# Patient Record
Sex: Female | Born: 1945
Health system: Southern US, Community
[De-identification: ages and names within clinical notes are randomized; demographics above are authoritative.]

## PROBLEM LIST (undated history)

## (undated) DIAGNOSIS — T8859XA Other complications of anesthesia, initial encounter: Secondary | ICD-10-CM

## (undated) DIAGNOSIS — M1990 Unspecified osteoarthritis, unspecified site: Secondary | ICD-10-CM

## (undated) DIAGNOSIS — N200 Calculus of kidney: Secondary | ICD-10-CM

## (undated) DIAGNOSIS — T4145XA Adverse effect of unspecified anesthetic, initial encounter: Secondary | ICD-10-CM

## (undated) DIAGNOSIS — N3090 Cystitis, unspecified without hematuria: Secondary | ICD-10-CM

## (undated) DIAGNOSIS — K219 Gastro-esophageal reflux disease without esophagitis: Secondary | ICD-10-CM

## (undated) DIAGNOSIS — G5600 Carpal tunnel syndrome, unspecified upper limb: Secondary | ICD-10-CM

## (undated) DIAGNOSIS — I10 Essential (primary) hypertension: Secondary | ICD-10-CM

## (undated) DIAGNOSIS — G4730 Sleep apnea, unspecified: Secondary | ICD-10-CM

## (undated) HISTORY — PX: TONSILLECTOMY: SUR1361

## (undated) HISTORY — PX: APPENDECTOMY: SHX54

## (undated) HISTORY — PX: ADENOIDECTOMY: SUR15

## (undated) HISTORY — PX: ABDOMINAL HYSTERECTOMY: SHX81

## (undated) HISTORY — PX: HERNIA REPAIR: SHX51

---
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
## 1978-06-24 HISTORY — PX: CARPAL TUNNEL RELEASE: SHX101

## 1983-06-25 HISTORY — PX: ABDOMINAL HYSTERECTOMY: SHX81

## 1988-06-24 HISTORY — PX: HERNIA REPAIR: SHX51

## 1998-04-03 ENCOUNTER — Other Ambulatory Visit: Admission: RE | Admit: 1998-04-03 | Discharge: 1998-04-03 | Payer: Self-pay | Admitting: *Deleted

## 1998-04-20 ENCOUNTER — Encounter: Payer: Self-pay | Admitting: Urology

## 1998-04-20 ENCOUNTER — Ambulatory Visit (HOSPITAL_COMMUNITY): Admission: RE | Admit: 1998-04-20 | Discharge: 1998-04-20 | Payer: Self-pay | Admitting: Urology

## 1998-04-27 ENCOUNTER — Encounter: Payer: Self-pay | Admitting: Urology

## 1998-04-28 ENCOUNTER — Ambulatory Visit (HOSPITAL_COMMUNITY): Admission: RE | Admit: 1998-04-28 | Discharge: 1998-04-28 | Payer: Self-pay | Admitting: Urology

## 1999-02-22 ENCOUNTER — Other Ambulatory Visit: Admission: RE | Admit: 1999-02-22 | Discharge: 1999-02-22 | Payer: Self-pay | Admitting: *Deleted

## 1999-04-17 ENCOUNTER — Other Ambulatory Visit: Admission: RE | Admit: 1999-04-17 | Discharge: 1999-04-17 | Payer: Self-pay | Admitting: Urology

## 1999-05-01 ENCOUNTER — Other Ambulatory Visit: Admission: RE | Admit: 1999-05-01 | Discharge: 1999-05-01 | Payer: Self-pay | Admitting: Urology

## 1999-05-08 ENCOUNTER — Encounter: Payer: Self-pay | Admitting: Urology

## 1999-05-08 ENCOUNTER — Ambulatory Visit (HOSPITAL_COMMUNITY): Admission: RE | Admit: 1999-05-08 | Discharge: 1999-05-08 | Payer: Self-pay | Admitting: Urology

## 1999-12-25 ENCOUNTER — Encounter: Payer: Self-pay | Admitting: Urology

## 1999-12-25 ENCOUNTER — Encounter: Admission: RE | Admit: 1999-12-25 | Discharge: 1999-12-25 | Payer: Self-pay | Admitting: Urology

## 2001-01-19 ENCOUNTER — Encounter: Payer: Self-pay | Admitting: Rheumatology

## 2001-01-19 ENCOUNTER — Encounter: Admission: RE | Admit: 2001-01-19 | Discharge: 2001-01-19 | Payer: Self-pay | Admitting: Rheumatology

## 2001-05-13 ENCOUNTER — Ambulatory Visit (HOSPITAL_COMMUNITY): Admission: RE | Admit: 2001-05-13 | Discharge: 2001-05-13 | Payer: Self-pay | Admitting: Gastroenterology

## 2002-05-23 ENCOUNTER — Inpatient Hospital Stay (HOSPITAL_COMMUNITY): Admission: EM | Admit: 2002-05-23 | Discharge: 2002-05-24 | Payer: Self-pay

## 2002-05-24 ENCOUNTER — Encounter: Payer: Self-pay | Admitting: Cardiology

## 2004-03-30 ENCOUNTER — Encounter: Payer: Self-pay | Admitting: Pulmonary Disease

## 2004-04-12 ENCOUNTER — Encounter: Admission: RE | Admit: 2004-04-12 | Discharge: 2004-04-12 | Payer: Self-pay | Admitting: Orthopedic Surgery

## 2004-04-23 ENCOUNTER — Ambulatory Visit: Payer: Self-pay | Admitting: Pulmonary Disease

## 2004-04-23 ENCOUNTER — Ambulatory Visit (HOSPITAL_BASED_OUTPATIENT_CLINIC_OR_DEPARTMENT_OTHER): Admission: RE | Admit: 2004-04-23 | Discharge: 2004-04-23 | Payer: Self-pay | Admitting: Pulmonary Disease

## 2004-05-04 ENCOUNTER — Ambulatory Visit (HOSPITAL_BASED_OUTPATIENT_CLINIC_OR_DEPARTMENT_OTHER): Admission: RE | Admit: 2004-05-04 | Discharge: 2004-05-04 | Payer: Self-pay | Admitting: Orthopedic Surgery

## 2004-05-30 ENCOUNTER — Encounter: Admission: RE | Admit: 2004-05-30 | Discharge: 2004-05-30 | Payer: Self-pay | Admitting: Gastroenterology

## 2004-07-03 ENCOUNTER — Ambulatory Visit: Payer: Self-pay | Admitting: Pulmonary Disease

## 2004-07-17 ENCOUNTER — Encounter: Admission: RE | Admit: 2004-07-17 | Discharge: 2004-10-15 | Payer: Self-pay | Admitting: Pulmonary Disease

## 2004-07-26 ENCOUNTER — Ambulatory Visit (HOSPITAL_COMMUNITY): Admission: RE | Admit: 2004-07-26 | Discharge: 2004-07-26 | Payer: Self-pay | Admitting: Gastroenterology

## 2004-07-31 ENCOUNTER — Ambulatory Visit: Payer: Self-pay | Admitting: Pulmonary Disease

## 2005-01-31 ENCOUNTER — Ambulatory Visit (HOSPITAL_COMMUNITY): Admission: RE | Admit: 2005-01-31 | Discharge: 2005-01-31 | Payer: Self-pay | Admitting: Gastroenterology

## 2005-08-02 ENCOUNTER — Encounter: Admission: RE | Admit: 2005-08-02 | Discharge: 2005-08-02 | Payer: Self-pay | Admitting: Gastroenterology

## 2006-04-01 ENCOUNTER — Encounter: Admission: RE | Admit: 2006-04-01 | Discharge: 2006-04-01 | Payer: Self-pay | Admitting: Internal Medicine

## 2007-02-26 ENCOUNTER — Ambulatory Visit: Payer: Self-pay | Admitting: Pulmonary Disease

## 2007-07-27 ENCOUNTER — Encounter: Admission: RE | Admit: 2007-07-27 | Discharge: 2007-07-27 | Payer: Self-pay | Admitting: Internal Medicine

## 2007-12-16 ENCOUNTER — Encounter: Admission: RE | Admit: 2007-12-16 | Discharge: 2007-12-16 | Payer: Self-pay | Admitting: Internal Medicine

## 2008-02-05 ENCOUNTER — Ambulatory Visit (HOSPITAL_COMMUNITY): Admission: RE | Admit: 2008-02-05 | Discharge: 2008-02-05 | Payer: Self-pay | Admitting: Internal Medicine

## 2008-03-21 DIAGNOSIS — Z9889 Other specified postprocedural states: Secondary | ICD-10-CM | POA: Insufficient documentation

## 2008-03-21 DIAGNOSIS — G4733 Obstructive sleep apnea (adult) (pediatric): Secondary | ICD-10-CM | POA: Insufficient documentation

## 2008-04-11 ENCOUNTER — Ambulatory Visit: Payer: Self-pay | Admitting: Pulmonary Disease

## 2008-04-11 DIAGNOSIS — R05 Cough: Secondary | ICD-10-CM | POA: Insufficient documentation

## 2008-04-11 DIAGNOSIS — E785 Hyperlipidemia, unspecified: Secondary | ICD-10-CM | POA: Insufficient documentation

## 2008-04-11 DIAGNOSIS — R059 Cough, unspecified: Secondary | ICD-10-CM | POA: Insufficient documentation

## 2008-04-25 ENCOUNTER — Ambulatory Visit: Payer: Self-pay | Admitting: Pulmonary Disease

## 2008-11-04 ENCOUNTER — Ambulatory Visit: Payer: Self-pay | Admitting: Pulmonary Disease

## 2008-11-04 DIAGNOSIS — J302 Other seasonal allergic rhinitis: Secondary | ICD-10-CM | POA: Insufficient documentation

## 2008-11-04 DIAGNOSIS — J309 Allergic rhinitis, unspecified: Secondary | ICD-10-CM | POA: Insufficient documentation

## 2008-11-27 ENCOUNTER — Ambulatory Visit: Payer: Self-pay | Admitting: Diagnostic Radiology

## 2008-11-27 ENCOUNTER — Emergency Department (HOSPITAL_BASED_OUTPATIENT_CLINIC_OR_DEPARTMENT_OTHER): Admission: EM | Admit: 2008-11-27 | Discharge: 2008-11-27 | Payer: Self-pay | Admitting: Emergency Medicine

## 2009-03-22 ENCOUNTER — Ambulatory Visit: Payer: Self-pay | Admitting: Pulmonary Disease

## 2009-03-22 DIAGNOSIS — J069 Acute upper respiratory infection, unspecified: Secondary | ICD-10-CM | POA: Insufficient documentation

## 2009-03-22 DIAGNOSIS — J22 Unspecified acute lower respiratory infection: Secondary | ICD-10-CM | POA: Insufficient documentation

## 2010-10-01 LAB — COMPREHENSIVE METABOLIC PANEL
ALT: 23 U/L (ref 0–35)
AST: 30 U/L (ref 0–37)
Albumin: 3.7 g/dL (ref 3.5–5.2)
Alkaline Phosphatase: 117 U/L (ref 39–117)
BUN: 8 mg/dL (ref 6–23)
CO2: 26 mEq/L (ref 19–32)
Calcium: 8.8 mg/dL (ref 8.4–10.5)
Chloride: 109 mEq/L (ref 96–112)
Creatinine, Ser: 0.7 mg/dL (ref 0.4–1.2)
GFR calc Af Amer: >60 mL/min (ref >60)
GFR calc non Af Amer: >60 mL/min (ref >60)
Glucose, Bld: 123 mg/dL — ABNORMAL HIGH (ref 70–99)
Potassium: 3.6 mEq/L (ref 3.5–5.1)
Sodium: 146 mEq/L — ABNORMAL HIGH (ref 135–145)
Total Bilirubin: 0.3 mg/dL (ref 0.3–1.2)
Total Protein: 6.8 g/dL (ref 6.0–8.3)

## 2010-10-01 LAB — URINE MICROSCOPIC-ADD ON

## 2010-10-01 LAB — DIFFERENTIAL
Basophils Absolute: 0.1 10*3/uL (ref 0.0–0.1)
Basophils Relative: 2 % — ABNORMAL HIGH (ref 0–1)
Eosinophils Absolute: 0.1 10*3/uL (ref 0.0–0.7)
Eosinophils Relative: 2 % (ref 0–5)
Lymphocytes Relative: 29 % (ref 12–46)
Lymphs Abs: 1.8 10*3/uL (ref 0.7–4.0)
Monocytes Absolute: 0.7 10*3/uL (ref 0.1–1.0)
Monocytes Relative: 12 % (ref 3–12)
Neutro Abs: 3.5 10*3/uL (ref 1.7–7.7)
Neutrophils Relative %: 55 % (ref 43–77)

## 2010-10-01 LAB — URINALYSIS, ROUTINE W REFLEX MICROSCOPIC
Bilirubin Urine: NEGATIVE
Glucose, UA: NEGATIVE mg/dL
Ketones, ur: NEGATIVE mg/dL
Nitrite: NEGATIVE
Protein, ur: NEGATIVE mg/dL
Specific Gravity, Urine: 1.021 (ref 1.005–1.030)
Urobilinogen, UA: 0.2 mg/dL (ref 0.0–1.0)
pH: 5.5 (ref 5.0–8.0)

## 2010-10-01 LAB — CBC
HCT: 37.7 % (ref 36.0–46.0)
Hemoglobin: 12.5 g/dL (ref 12.0–15.0)
MCHC: 33.1 g/dL (ref 30.0–36.0)
MCV: 87.7 fL (ref 78.0–100.0)
Platelets: 234 10*3/uL (ref 150–400)
RBC: 4.3 MIL/uL (ref 3.87–5.11)
RDW: 13.4 % (ref 11.5–15.5)
WBC: 6.2 10*3/uL (ref 4.0–10.5)

## 2010-11-09 NOTE — Discharge Summary (Signed)
   NAME:  [name], [name]                        ACCOUNT NO.:  [id number]   MEDICAL RECORD NO.:  [id number]                   PATIENT TYPE:  INP   LOCATION:  0371                                 FACILITY:  [facility]   PHYSICIAN:  Ike Bene, M.D.              DATE OF BIRTH:  May 12, 1946   DATE OF ADMISSION:  05/23/2002  DATE OF DISCHARGE:  05/24/2002                                 DISCHARGE SUMMARY   HOSPITAL COURSE:  [name] was admitted with a four hour history of  substernal chest pain. Cardiac isoenzymes were normal during admission to  [facility]. She has a significant family history of  coronary artery disease. She has a chronic problem with left arm and  shoulder pain which unfortunately, confounds the issue. This is related to  reflex sympathetic dystrophy. She had an nuclear medicine stress test  performed by [name], which was normal without any evidence of  ischemia. The decision was made that the patient could be safely discharged  to home with further evaluation as an outpatient for probable  gastroesophageal reflux disease substernal chest pain.   DISCHARGE DIAGNOSES:  1. Non-cardiac chest pain.  2. Normal nuclear medicine exercise stress test.  3. Chronic left arm and shoulder pain related to reflux sympathetic     dystrophy.   CONDITION ON DISCHARGE:  The patient was discharged in good condition and  improved without any evidence of chest pain.   DISCHARGE MEDICATIONS:  She was to be discharged home on her same pre-  hospital medications which includes Voltaren 75 mg by mouth daily for pain  and __ 40 mg by mouth each day.   FOLLOW UP:  She will follow-up with [name] one week after discharge.                                               Ike Bene, M.D.    RM/MEDQ  D:  06/11/2002  T:  06/12/2002  Job:  [id number]

## 2010-11-09 NOTE — Op Note (Signed)
[name], [name]              ACCOUNT NO.:  [id number]   MEDICAL RECORD NO.:  [id number]          PATIENT TYPE:  AMB   LOCATION:  ENDO                         FACILITY:  [facility]   PHYSICIAN:  James L. Malon Kindle., M.D.DATE OF BIRTH:  Nov 10, 1945   DATE OF PROCEDURE:  07/26/2004  DATE OF DISCHARGE:                                 OPERATIVE REPORT   PROCEDURE:  Esophagogastroduodenoscopy.   MEDICATIONS:  Fentanyl 50 mcg, Versed 7.5 mg IV.   SCOPE:  Olympus upper endoscope.   INDICATIONS FOR PROCEDURE:  Persistent _________ reflux symptoms with  dysphagia, barium swallow showing no clear stricture.   DESCRIPTION OF PROCEDURE:  Procedure had been explained to the patient and  consent obtained.  Patient placed in the left lateral decubitus position.  The Olympus scope was inserted and advanced.  The stomach was entered, the  pylorus identified and passed.  The duodenum including the duodenal bulb and  second portion were seen well and was unremarkable.  The scope was withdrawn  back into the stomach.  The pyloric channel, antrum and body were normal.  The fundus and cardia were seen on retroflexed view and looked normal.  There was a hiatal hernia __________ no stricture.  No evidence of Barrett's  esophagus.  The proximal esophagus was endoscopically normal.  The scope was  withdrawn and the patient tolerated the procedure well.   ASSESSMENT:  Gastroesophageal reflux disease, 530.81.   PLAN:  Will give Nexium in the morning, Prilosec OTC in the evening.  Give a  reflux sheet and see her back in the office in two months.      JLE/MEDQ  D:  07/26/2004  T:  07/26/2004  Job:  [id number]   cc:   Ike Bene, M.D.  [address]  [place]  [place] [address]  Fax: [phone]

## 2010-11-09 NOTE — H&P (Signed)
NAME:  ADYSSON, REVELLE                        ACCOUNT NO.:  [id number]   MEDICAL RECORD NO.:  [id number]                   PATIENT TYPE:  INP   LOCATION:  0371                                 [facility]ACILITY:  [facility]   PHYSICIAN:  Candyce Churn, M.D.          DATE OF BIRTH:  May 21, 1946   DATE OF ADMISSION:  05/23/2002  DATE OF DISCHARGE:                                HISTORY & PHYSICAL   CHIEF COMPLAINT:  Chest pain.   HISTORY OF PRESENT ILLNESS:  The patient is a 65 year old female with a  history of obesity, GERD, hiatal hernia, and a family history of coronary  artery disease, who presents with four hours of substernal chest  pressure/pain starting approximately 3:30 p.m.  The pain radiated down her  left arm as well.  The patient  complained of some palpitations and a feeling of severe dyspnea.  The pain  occurred at rest.  There was no substernal burning and no acid taste in her  mouth.  The patient wondered if the chest pain was from her reflux, and she  took a Nexium, which did not help it.  It also hurt to take a deep breath.  She is admitted to rule out coronary artery disease because she was given  three sublingual nitroglycerin in the emergency room, and her chest pain  resolved.   MEDICATIONS:  1. Voltaren 75 mg occasionally for joint pain.  She has not taken any in a     week.  2. Nexium 40 mg daily as needed.   ALLERGIES:  SULFA.   PAST MEDICAL HISTORY:  Mild asthma.  Otherwise, as per HPI.   PAST SURGICAL HISTORY:  1. Total abdominal hysterectomy without oophorectomy.  2. Inguinal hernia repair.  3. Bilateral carpal tunnel surgery.  4. Appendectomy.   FAMILY HISTORY:  Positive for diabetes, stroke, hypertension, and her father  had a small MI in his 63s.   SOCIAL HISTORY:  The patient is married.  No tobacco use or alcohol use.   REVIEW OF SYSTEMS:  The pain seems to not be exertional in nature.  It is  not pleuritic currently.  Otherwise  noncontributory.   PHYSICAL EXAMINATION:  GENERAL:  Obese female in no acute distress.  Pain-  free after three sublingual nitroglycerin in the emergency room.  VITAL SIGNS:  Blood pressure was 130/80, pulse 75 and regular, afebrile,  respiratory rate 18.  HEENT:  Atraumatic, normocephalic.  NECK:  Supple.  CHEST:  Clear to auscultation.  Painful to palpation at the left sternal  border.  ABDOMEN:  Soft and nontender.  EXTREMITIES:  Without cyanosis, clubbing, or edema.  NEUROLOGIC:  Nonfocal.   LABORATORY DATA:  White count 7200, hemoglobin 12.5, platelet count 291,000.  Sodium 140, potassium 3.4, chloride 110, bicarbonate 22, BUN 15, creatinine  0.4, blood sugar 117.  PT 13 seconds, PTT 34 seconds.  LFTs are normal.  CPK  124, CK-MB 1.2, troponin  less than 0.01.   EKG reveals sinus rhythm at 74 beats per minute.  Normal EKG.  Occasional  PACs.  T-waves inverted in lead III.  No ST segment elevation or depression.   Chest x-ray reveals no active disease.  Mild cardiomegaly.   ASSESSMENT:  The patient is a 65 year old female with chest pain for about  four hours, relieved with three to four sublingual nitroglycerin.  Need to  rule out ischemic heart disease.  Likely esophageal reflux.  Could also be  costochondritis.   PLAN:  1. CPKs x 3.  2. EKG in the a.m.  3. Nasal cannula O2 at 2 L.  4. Lovenox 1 mg/kg q.12h., first dose in the emergency room.  5. Aspirin therapy.  6. Protonix 40 mg daily.  7. Cardiac consult for treadmill stress test with Cardiolite in the a.m.  8. Repeat EKG if the patient has recurrent chest pain.                                                  Candyce Churn, M.D.    RNG/MEDQ  D:  05/24/2002  T:  05/24/2002  Job:  [id number]   cc:   Cassell Clement, M.D.  [address]  [place]  [place] [address]  Fax: [phone]   Ike Bene, M.D.  [address]  [place]  [place] [address]  Fax: [phone]

## 2010-11-09 NOTE — Procedures (Signed)
[name], [name] NO.:  [id number]   MEDICAL RECORD NO.:  [id number]          PATIENT TYPE:  OUT   LOCATION:  SLEEP CENTER                 FACILITY:  [facility]   PHYSICIAN:  Marcelyn Bruins, M.D. [facility] DATE OF BIRTH:  04/10/1946   DATE OF STUDY:  04/23/2004                              NOCTURNAL POLYSOMNOGRAM   REFERRING PHYSICIAN:  Dr. Marcelyn Bruins.   INDICATION FOR THE STUDY:  Hypersomnia with sleep apnea.  Epworth score is  13.   SLEEP ARCHITECTURE:  The patient had a total sleep time of 313 minutes with  a sleep efficiency of 70%.  She never achieved slow wave sleep and had  decreased REM.  Sleep onset latency was prolonged at 67 minutes and REM  onset was normal.   IMPRESSION:  1.  Very mild obstructive sleep apnea/hypopnea syndrome with a respiratory      disturbance index of seven events per hour and oxygen desaturation as      low as 78%.  Events were not positional.  There was significantly      decreased slow wave sleep and REM.  2.  Moderate snoring was noted throughout the study.  3.  No clinically significant cardiac arrhythmia.  4.  Moderate numbers of periodic leg jerks with six per hour resulting in      arousal or awakening.  Clinical correlation is suggested.      KC/MEDQ  D:  05/16/2004 12:18:38  T:  05/16/2004 14:31:39  Job:  [id number]

## 2010-11-09 NOTE — Consult Note (Signed)
NAME:  [name], [name]                        ACCOUNT NO.:  [id number]   MEDICAL RECORD NO.:  [id number]                   PATIENT TYPE:  INP   LOCATION:  0371                                 FACILITY[facility]   PHYSICIAN:  Cassell Clement, M.D.              DATE OF BIRTH:  [birth year]   DATE OF CONSULTATION:  05/24/2002  DATE OF DISCHARGE:                                   CONSULTATION   CHIEF COMPLAINT:  Chest pain.   HISTORY OF PRESENT ILLNESS:  This is a 65 year old, African-American female  admitted yesterday with anterior chest pain.  The pain was associated with  mild diaphoresis and no nausea or vomiting.  The pain did not radiate.  She  has no prior history of known coronary artery disease.  She does have a past  history of gastroesophageal reflux disease and is on Nexium.  Other  medication include Voltaren for arthritis.  She came to the emergency room  where she was evaluated by Dr. Kevan Ny and was admitted.  Electrocardiogram  was normal and her CK-MBs and troponin I's have been negative x2.  Telemetry  has been stable.   FAMILY HISTORY:  Positive for vascular disease in the father who has had a  stroke.   SOCIAL HISTORY:  She is married.  She has children.  She is a housewife.  She does not use alcohol or tobacco.   PAST SURGICAL HISTORY:  1. Hysterectomy.  2. Tonsillectomy.  3. Herniorrhaphy.  4. Carpal tunnel surgery x3.   REVIEW OF SYMPTOMS:  She is otherwise noncontributory.  She did have a  recent mammogram which was negative and has an appointment to see a surgeon  about left breast pain later this month.   PHYSICAL EXAMINATION:  VITAL SIGNS:  Weight 218.  Blood pressure 114/51,  pulse 60.  Oxygen saturations 96%.  GENERAL:  Reveals an obese woman in no acute distress.  HEENT:  Unremarkable.  NECK:  Jugular venous distention.  Carotids normal.  CHEST:  Clear to auscultation and percussion.  HEART:  Regular rate and rhythm without murmur, rub or  gallop.  ABDOMEN:  Soft and nontender.  EXTREMITIES:  Good peripheral pulses with no edema or phlebitis.   IMPRESSION:  Probable noncardiac chest pain, but with risk factors.    RECOMMENDATIONS:  Will proceed with a stress Cardiolite at [facility] later  today if it can be arranged logistically.                                               Cassell Clement, M.D.    TB/MEDQ  D:  05/24/2002  T:  05/24/2002  Job:  [id number]   cc:   Ike Bene, M.D.  [address]  [place]  [place] [address]  Fax: [phone]  Candyce Churn, M.D.  [address] [place]  [place] [address]  Fax: [phone]

## 2010-11-09 NOTE — Op Note (Signed)
[name], [name]              ACCOUNT NO.:  [id number]   MEDICAL RECORD NO.:  [id number]          PATIENT TYPE:  AMB   LOCATION:  NESC                         FACILITY:  [facility]   PHYSICIAN:  Madlyn Frankel. Charlann Boxer, M.D.  DATE OF BIRTH:  March 31, 1946   DATE OF PROCEDURE:  05/04/2004  DATE OF DISCHARGE:                                 OPERATIVE REPORT   PREOPERATIVE DIAGNOSES:  Right knee medial meniscal tear associated in a  setting of moderate degree of degenerative joint changes primarily in the  medial compartment patellofemoral.   POSTOPERATIVE DIAGNOSES/FINDINGS:  1.  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  2.  Grade 2-4 chondromalacia within the medial compartment including grade 4      exposed bone on the tibial surface and unstable flaps of cartilage along      the medial femoral condyle.  3.  Degenerative tearing to the posterior horn of the mid body of the medial      meniscus.  4.  Intact anterior cruciate ligament.  5.  Intact lateral compartment.   PROCEDURE:  Right knee diagnostic and operative arthroscopy with limited  anterior medial synovectomy, chondroplasty over the medial femoral condyle,  tibial surface as well as the patellofemoral compartment. A partial medial  meniscectomy was carried out.   SURGEON:  Madlyn Frankel. Charlann Boxer, M.D.   ASSISTANT:  None.   ANESTHESIA:  LMA general anesthesia.   COMPLICATIONS:  None apparent.   INDICATIONS FOR PROCEDURE:  [name] is a pleasant 65 year old female who  had been followed in the office for right knee pain. She had complained of  mechanical symptoms initially with medial joint line tenderness. We had  obtained x-rays that had indicated degenerative changes present, however,  based on persistent mechanical problems, inadequate response to conservative  measure including antiinflammatories and injections. The MRI was ordered  that revealed that she  had moderate degenerative changes with complex  tearing to the mid body of posterior horn of the medial meniscus.  There is  also noted to be some mild patellofemoral changes.   After reviewing the risks and benefits of right knee arthroscopy in the  setting including the risk that her knee would continue to be a bother based  on her arthritic symptoms, she consented for the procedure.  My feelings on  this procedure are that they could potentially debride mechanical symptoms  including the meniscus tears that could result in an ability to tolerate the  degenerative changes easier. After reviewing these risks and benefits, she  consented for the above procedure.   DESCRIPTION OF PROCEDURE:  The patient was brought to the operative theatre.  Once adequate anesthesia and preoperative antibiotics were administered, the  patient was positioned supine with the right leg in a leg holder. The right  lower extremity was then prepped and draped in a sterile fashion. Standard  inferolateral, superolateral and inferomedial portals were utilized.  Diagnostic evaluation of the knee was carried out.  The above noted findings  were noted.  Specifically there was degenerative changes  in the  patellofemoral medial compartment. These were immediately evident. She did  have evidence of some contracture to the medial compartment.  Moderate  effort on the MCL was required in order to open it up.  It would not  surprise me if the patient had some MCL tenderness postoperatively.  Upon  entry into the medial compartment, the changes of the medial femoral condyle  and tibial plateau were identified as was this complex tear into the mid  body and posterior horn of the medial meniscus. A 3.5 shaver was utilized to  initially debride hypertrophic and abundant synovium anteriorly to help with  the exposure but then also utilize and debride the medial meniscus. Loose  fragments of cartilage off the medial femoral  condyle were identified with a  probe and then debrided using the 3.5 shaver. Examination of the ACL  revealed that it was intact and lateral compartment to be intact.   Examination of the patellofemoral compartment revealed that it had some  grade 2 changes and the 3.5 shaver was utilized to debride this area.  Limited anterior and medial synovectomy was carried out to allow for  exposure and visualization in order to perform the case.   Following this, the knee was irrigated and suction drained. The portals were  reapproximated using 3-0 nylon and the knee dressed with a sterile bulky  Jones dressing. The patient was extubated and transferred to the recovery  room in stable condition.  Followup with her in 10-14 days. She will be  weightbearing as tolerated. Medications as prescribed.      MDO/MEDQ  D:  05/04/2004  T:  05/05/2004  Job:  [id number]

## 2010-11-09 NOTE — Op Note (Signed)
NAMEJORENE, [name]              ACCOUNT NO.:  [id number]   MEDICAL RECORD NO.:  [id number]          PATIENT TYPE:  AMB   LOCATION:  ENDO                         FACILITY:  [facility]   PHYSICIAN:  James L. Malon Kindle., M.D.DATE OF BIRTH:  02/25/1946   DATE OF PROCEDURE:  01/31/2005  DATE OF DISCHARGE:                                 OPERATIVE REPORT   PROCEDURE:  Colonoscopy.   MEDICATIONS:  Fentanyl 60 mcg, Versed 6 mg IV.   INDICATIONS FOR PROCEDURE:  Heme positive stools.   SCOPE:  Pediatric adjustable scope.   DESCRIPTION OF PROCEDURE:  The procedure explained to the patient and  consent obtained. With the patient in the left lateral decubitus position, a  digital exam performed, pediatric adjustable scope inserted and advanced.  The cecum easily reached, ileocecal valve and appendiceal orifice seen. The  scope withdrawn, mucosa carefully examined on withdrawal, no polyp seen  throughout, no diverticulosis. Normal mucosal pattern throughout. Rectum  free of polyps. In a retroflexed view, the patient was seen to have large  internal hemorrhoids. Scope withdrawn, patient tolerated the procedure well.   ASSESSMENT:  Heme positive stools probably due to internal hemorrhoids,  578.1. Will keep in fiber supplements, give a hemorrhoid instruction sheet,  recommend yearly Hemoccult's possibly repeating colonoscopy in 10 years.       JLE/MEDQ  D:  01/31/2005  T:  01/31/2005  Job:  [id number]   cc:   Ladell Pier, M.D.  Fax: [phone]   Leona Singleton, M.D.  [address]  [place]  [place] [address]  Fax: [phone]

## 2010-11-09 NOTE — Op Note (Signed)
[facility]  Patient:    [name], [name] Visit Number: [id number] MRN: [id number]          Service Type: END Location: ENDO Attending Physician:  Orland Mustard Dictated by:   Llana Aliment. Randa Evens, M.D. Proc. Date: 05/13/01 Admit Date:  05/13/2001   CC:         Barbette Hair. Vaughan Basta., M.D.   Operative Report  PROCEDURE:  Esophagogastroduodenoscopy.  MEDICATIONS:  Cetacaine spray, Fentanyl 50 mc, Versed 6 mg IV.  ENDOSCOPIST:  Llana Aliment. Randa Evens, M.D.  INDICATIONS FOR PROCEDURE:  The patient with a history of reflux with some increasing symptoms and signs of dysphagia.  Previous endoscopies have not shown the stricture.  She had been on Reglan, but got off the Reglan and we started her back.  DESCRIPTION OF PROCEDURE:  The procedure was explained to the patient and consent was obtained.  With the patient in the left lateral decubitus position the Olympus video endoscope was inserted into the esophagus and advanced under direct visualization.  The stomach was entered, the pylorus was identified and passed.  The duodenum including the bulb and second portion was seen well. The bulb and second portion were completely free of inflammation or ulceration.  The scope was withdrawn back into the stomach.  The pyloric channel was normal.  The antrum of the body was normal.  Along the greater curve was still some retained food material despite an overnight fast.  The fundus and cardia were normal.  There was a large 3-4 cm hiatal hernia.  The GE junction was widely patent.  There was no sign at all of a stricture.  The proximal esophagus was seen and was normal.  The scope was withdrawn.  The patient tolerated the procedure well.  She did have some wretching and gagging during the procedure.  She was resting comfortably at the termination of the procedure.  She was maintained on low flow oxygen and pulse oximeter throughout the  procedure.  ASSESSMENT: 1. Widely patent GE junction consistent with gastroesophageal reflux disease. 2. Probable gastroparesis.  I think this is clearly a factor in her reflux.  PLAN:  Will continue on Nexium and Reglan.  Will see her back in the office in three months. Dictated by:   Llana Aliment. Randa Evens, M.D. Attending Physician:  Orland Mustard DD:  05/13/01 TD:  05/13/01 Job: [id number]

## 2011-01-02 ENCOUNTER — Ambulatory Visit
Admission: RE | Admit: 2011-01-02 | Discharge: 2011-01-02 | Disposition: A | Discharge status: Home / Self Care | Payer: Medicare Other | Source: Ambulatory Visit | Attending: Internal Medicine | Admitting: Internal Medicine

## 2011-01-02 ENCOUNTER — Other Ambulatory Visit: Payer: Self-pay | Admitting: Internal Medicine

## 2011-01-02 DIAGNOSIS — R059 Cough, unspecified: Secondary | ICD-10-CM

## 2011-01-02 DIAGNOSIS — R05 Cough: Secondary | ICD-10-CM

## 2011-07-15 DIAGNOSIS — J3081 Allergic rhinitis due to animal (cat) (dog) hair and dander: Secondary | ICD-10-CM | POA: Diagnosis not present

## 2011-07-15 DIAGNOSIS — J301 Allergic rhinitis due to pollen: Secondary | ICD-10-CM | POA: Diagnosis not present

## 2011-07-15 DIAGNOSIS — J3089 Other allergic rhinitis: Secondary | ICD-10-CM | POA: Diagnosis not present

## 2011-07-18 DIAGNOSIS — N390 Urinary tract infection, site not specified: Secondary | ICD-10-CM | POA: Diagnosis not present

## 2011-08-07 DIAGNOSIS — N390 Urinary tract infection, site not specified: Secondary | ICD-10-CM | POA: Diagnosis not present

## 2011-08-07 DIAGNOSIS — N343 Urethral syndrome, unspecified: Secondary | ICD-10-CM | POA: Diagnosis not present

## 2011-08-14 DIAGNOSIS — J301 Allergic rhinitis due to pollen: Secondary | ICD-10-CM | POA: Diagnosis not present

## 2011-08-14 DIAGNOSIS — J3089 Other allergic rhinitis: Secondary | ICD-10-CM | POA: Diagnosis not present

## 2011-08-14 DIAGNOSIS — J3081 Allergic rhinitis due to animal (cat) (dog) hair and dander: Secondary | ICD-10-CM | POA: Diagnosis not present

## 2011-09-13 DIAGNOSIS — M629 Disorder of muscle, unspecified: Secondary | ICD-10-CM | POA: Diagnosis not present

## 2011-09-13 DIAGNOSIS — M778 Other enthesopathies, not elsewhere classified: Secondary | ICD-10-CM | POA: Diagnosis not present

## 2011-09-13 DIAGNOSIS — IMO0001 Reserved for inherently not codable concepts without codable children: Secondary | ICD-10-CM | POA: Diagnosis not present

## 2011-09-13 DIAGNOSIS — Z79899 Other long term (current) drug therapy: Secondary | ICD-10-CM | POA: Diagnosis not present

## 2011-09-13 DIAGNOSIS — E7800 Pure hypercholesterolemia, unspecified: Secondary | ICD-10-CM | POA: Diagnosis not present

## 2011-09-13 DIAGNOSIS — I10 Essential (primary) hypertension: Secondary | ICD-10-CM | POA: Diagnosis not present

## 2011-10-08 DIAGNOSIS — Z1231 Encounter for screening mammogram for malignant neoplasm of breast: Secondary | ICD-10-CM | POA: Diagnosis not present

## 2011-10-09 DIAGNOSIS — Z09 Encounter for follow-up examination after completed treatment for conditions other than malignant neoplasm: Secondary | ICD-10-CM | POA: Diagnosis not present

## 2011-10-09 DIAGNOSIS — H60399 Other infective otitis externa, unspecified ear: Secondary | ICD-10-CM | POA: Diagnosis not present

## 2011-10-09 DIAGNOSIS — R928 Other abnormal and inconclusive findings on diagnostic imaging of breast: Secondary | ICD-10-CM | POA: Diagnosis not present

## 2011-10-11 DIAGNOSIS — Z01419 Encounter for gynecological examination (general) (routine) without abnormal findings: Secondary | ICD-10-CM | POA: Diagnosis not present

## 2011-10-11 DIAGNOSIS — Z124 Encounter for screening for malignant neoplasm of cervix: Secondary | ICD-10-CM | POA: Diagnosis not present

## 2011-12-19 DIAGNOSIS — E119 Type 2 diabetes mellitus without complications: Secondary | ICD-10-CM | POA: Diagnosis not present

## 2011-12-19 DIAGNOSIS — I10 Essential (primary) hypertension: Secondary | ICD-10-CM | POA: Diagnosis not present

## 2011-12-19 DIAGNOSIS — N390 Urinary tract infection, site not specified: Secondary | ICD-10-CM | POA: Diagnosis not present

## 2012-01-08 DIAGNOSIS — R197 Diarrhea, unspecified: Secondary | ICD-10-CM | POA: Diagnosis not present

## 2012-01-28 DIAGNOSIS — R1031 Right lower quadrant pain: Secondary | ICD-10-CM | POA: Diagnosis not present

## 2012-01-28 DIAGNOSIS — N390 Urinary tract infection, site not specified: Secondary | ICD-10-CM | POA: Diagnosis not present

## 2012-02-04 DIAGNOSIS — R10814 Left lower quadrant abdominal tenderness: Secondary | ICD-10-CM | POA: Diagnosis not present

## 2012-02-04 DIAGNOSIS — E559 Vitamin D deficiency, unspecified: Secondary | ICD-10-CM | POA: Diagnosis not present

## 2012-02-04 DIAGNOSIS — N390 Urinary tract infection, site not specified: Secondary | ICD-10-CM | POA: Diagnosis not present

## 2012-02-04 DIAGNOSIS — Z79899 Other long term (current) drug therapy: Secondary | ICD-10-CM | POA: Diagnosis not present

## 2012-02-04 DIAGNOSIS — N1330 Unspecified hydronephrosis: Secondary | ICD-10-CM | POA: Diagnosis not present

## 2012-02-04 DIAGNOSIS — N134 Hydroureter: Secondary | ICD-10-CM | POA: Diagnosis not present

## 2012-02-04 DIAGNOSIS — IMO0001 Reserved for inherently not codable concepts without codable children: Secondary | ICD-10-CM | POA: Diagnosis not present

## 2012-02-04 DIAGNOSIS — Z0000 Encounter for general adult medical examination without abnormal findings: Secondary | ICD-10-CM | POA: Diagnosis not present

## 2012-02-04 DIAGNOSIS — R109 Unspecified abdominal pain: Secondary | ICD-10-CM | POA: Diagnosis not present

## 2012-02-04 DIAGNOSIS — R1032 Left lower quadrant pain: Secondary | ICD-10-CM | POA: Diagnosis not present

## 2012-02-04 DIAGNOSIS — I10 Essential (primary) hypertension: Secondary | ICD-10-CM | POA: Diagnosis not present

## 2012-02-04 DIAGNOSIS — E7800 Pure hypercholesterolemia, unspecified: Secondary | ICD-10-CM | POA: Diagnosis not present

## 2012-02-05 DIAGNOSIS — N390 Urinary tract infection, site not specified: Secondary | ICD-10-CM | POA: Diagnosis not present

## 2012-02-27 DIAGNOSIS — J019 Acute sinusitis, unspecified: Secondary | ICD-10-CM | POA: Diagnosis not present

## 2012-03-06 ENCOUNTER — Emergency Department (HOSPITAL_COMMUNITY)
Admission: EM | Admit: 2012-03-06 | Discharge: 2012-03-06 | Disposition: A | Discharge status: Home / Self Care | Payer: Medicare Other | Attending: Emergency Medicine | Admitting: Emergency Medicine

## 2012-03-06 ENCOUNTER — Emergency Department (HOSPITAL_COMMUNITY): Payer: Medicare Other

## 2012-03-06 ENCOUNTER — Encounter (HOSPITAL_COMMUNITY): Payer: Self-pay | Admitting: Emergency Medicine

## 2012-03-06 DIAGNOSIS — N390 Urinary tract infection, site not specified: Secondary | ICD-10-CM

## 2012-03-06 DIAGNOSIS — K5730 Diverticulosis of large intestine without perforation or abscess without bleeding: Secondary | ICD-10-CM | POA: Diagnosis not present

## 2012-03-06 DIAGNOSIS — Z79899 Other long term (current) drug therapy: Secondary | ICD-10-CM | POA: Diagnosis not present

## 2012-03-06 DIAGNOSIS — E119 Type 2 diabetes mellitus without complications: Secondary | ICD-10-CM | POA: Insufficient documentation

## 2012-03-06 DIAGNOSIS — R319 Hematuria, unspecified: Secondary | ICD-10-CM | POA: Diagnosis not present

## 2012-03-06 DIAGNOSIS — Z7982 Long term (current) use of aspirin: Secondary | ICD-10-CM | POA: Insufficient documentation

## 2012-03-06 DIAGNOSIS — N3289 Other specified disorders of bladder: Secondary | ICD-10-CM | POA: Diagnosis not present

## 2012-03-06 DIAGNOSIS — Z9089 Acquired absence of other organs: Secondary | ICD-10-CM | POA: Diagnosis not present

## 2012-03-06 HISTORY — DX: Carpal tunnel syndrome, unspecified upper limb: G56.00

## 2012-03-06 HISTORY — DX: Cystitis, unspecified without hematuria: N30.90

## 2012-03-06 HISTORY — DX: Calculus of kidney: N20.0

## 2012-03-06 LAB — URINALYSIS, ROUTINE W REFLEX MICROSCOPIC
Bilirubin Urine: NEGATIVE
Glucose, UA: NEGATIVE mg/dL
Ketones, ur: NEGATIVE mg/dL
Nitrite: NEGATIVE
Protein, ur: NEGATIVE mg/dL
Specific Gravity, Urine: 1.019 (ref 1.005–1.030)
Urobilinogen, UA: 0.2 mg/dL (ref 0.0–1.0)
pH: 5.5 (ref 5.0–8.0)

## 2012-03-06 LAB — CBC WITH DIFFERENTIAL/PLATELET
Basophils Absolute: 0 10*3/uL (ref 0.0–0.1)
Basophils Relative: 1 % (ref 0–1)
Eosinophils Absolute: 0.4 10*3/uL (ref 0.0–0.7)
Eosinophils Relative: 6 % — ABNORMAL HIGH (ref 0–5)
HCT: 35.3 % — ABNORMAL LOW (ref 36.0–46.0)
Hemoglobin: 11.5 g/dL — ABNORMAL LOW (ref 12.0–15.0)
Lymphocytes Relative: 35 % (ref 12–46)
Lymphs Abs: 2.3 10*3/uL (ref 0.7–4.0)
MCH: 28 pg (ref 26.0–34.0)
MCHC: 32.6 g/dL (ref 30.0–36.0)
MCV: 85.9 fL (ref 78.0–100.0)
Monocytes Absolute: 0.5 10*3/uL (ref 0.1–1.0)
Monocytes Relative: 8 % (ref 3–12)
Neutro Abs: 3.3 10*3/uL (ref 1.7–7.7)
Neutrophils Relative %: 51 % (ref 43–77)
Platelets: 288 10*3/uL (ref 150–400)
RBC: 4.11 MIL/uL (ref 3.87–5.11)
RDW: 14.2 % (ref 11.5–15.5)
WBC: 6.5 10*3/uL (ref 4.0–10.5)

## 2012-03-06 LAB — BASIC METABOLIC PANEL
BUN: 11 mg/dL (ref 6–23)
CO2: 24 mEq/L (ref 19–32)
Calcium: 9.3 mg/dL (ref 8.4–10.5)
Chloride: 102 mEq/L (ref 96–112)
Creatinine, Ser: 0.64 mg/dL (ref 0.50–1.10)
GFR calc Af Amer: >90 mL/min (ref >90)
GFR calc non Af Amer: >90 mL/min (ref >90)
Glucose, Bld: 126 mg/dL — ABNORMAL HIGH (ref 70–99)
Potassium: 3.7 mEq/L (ref 3.5–5.1)
Sodium: 136 mEq/L (ref 135–145)

## 2012-03-06 LAB — URINE MICROSCOPIC-ADD ON

## 2012-03-06 MED ORDER — HYDROCODONE-ACETAMINOPHEN 5-325 MG PO TABS: ORAL_TABLET | ORAL | Status: AC

## 2012-03-06 MED ORDER — PHENAZOPYRIDINE HCL 200 MG PO TABS: 200.0000 mg | ORAL_TABLET | Freq: Three times a day (TID) | ORAL | Status: AC | PRN

## 2012-03-06 MED ORDER — SODIUM CHLORIDE 0.9 % IV SOLN
Freq: Once | INTRAVENOUS | Status: AC
  Administered 2012-03-06: 09:00:00 via INTRAVENOUS

## 2012-03-06 MED ORDER — CEPHALEXIN 500 MG PO CAPS: 500.0000 mg | ORAL_CAPSULE | Freq: Three times a day (TID) | ORAL | Status: AC

## 2012-03-06 MED ORDER — HYDROMORPHONE HCL PF 1 MG/ML IJ SOLN
0.5000 mg | Freq: Once | INTRAMUSCULAR | Status: AC
  Administered 2012-03-06: 0.5 mg via INTRAVENOUS
  Filled 2012-03-06: qty 1

## 2012-03-06 MED ORDER — DEXTROSE 5 % IV SOLN
1.0000 g | Freq: Once | INTRAVENOUS | Status: AC
  Administered 2012-03-06: 1 g via INTRAVENOUS
  Filled 2012-03-06: qty 10

## 2012-03-06 MED ORDER — PHENAZOPYRIDINE HCL 200 MG PO TABS
200.0000 mg | ORAL_TABLET | Freq: Once | ORAL | Status: AC
  Administered 2012-03-06: 200 mg via ORAL
  Filled 2012-03-06: qty 1

## 2012-03-06 NOTE — ED Notes (Signed)
Pt to CT

## 2012-03-06 NOTE — ED Notes (Signed)
Pt alert and oriented x4. Respirations even and unlabored, bilateral symmetrical rise and fall of chest. Skin warm and dry. In no acute distress. Denies needs.   

## 2012-03-06 NOTE — ED Provider Notes (Signed)
History     CSN: [id number]  Arrival date & time 03/06/12  0749   First MD Initiated Contact with Patient 03/06/12 [id number]      Chief Complaint  Patient presents with  . Abdominal Pain    (Consider location/radiation/quality/duration/timing/severity/associated sxs/prior treatment) HPI Comments: Pt reports about 1.5 months ago began having episodes of a sharp, stinging pain in bladder and urethra area.  Now is more frequent, happening up to 2 times per day lasting 15 minutes up to no more than 1 hour or so.  Slight nausea, no vomiting.  Denies dysuria.  She had some blood in urine at the time, seen by Dr. Brunilda Payor, treated as infection.  She also saw Dr. Henderson Cloud with GYN who evaluated her, did an ultrasound and told her ovaries were normal about 1 month ago.  She has a recheck with Dr. Brunilda Payor soon, but this AM, has had pain all morning and not improving.  No fevers, chills.  No flank pain.  Pain is in middle of suprapubic area.  No vaginal bleed or discharge.  She has had appendectomy and partial hysterectomy in the past.  No constipation or diarrhea.  When she sits or act of standing up seems to trigger the pain.  She has not had a CT or MRI for these symptoms.  Pain gets so severe, she cannot walk until it subsides.    The history is provided by the patient, the spouse and medical records.    Past Medical History  Diagnosis Date  . Kidney stones   . Kidney stone   . Bladder infection   . Carpal tunnel syndrome   . Diabetes mellitus     Past Surgical History  Procedure Date  . Tonsillectomy   . Abdominal hysterectomy     partial  . Hernia repair   . Appendectomy     History reviewed. No pertinent family history.  History  Substance Use Topics  . Smoking status: Never Smoker   . Smokeless tobacco: Not on file  . Alcohol Use: No    OB History    Grav Para Term Preterm Abortions TAB SAB Ect Mult Living                  Review of Systems  Constitutional: Negative for fever  and chills.  Gastrointestinal: Positive for nausea and abdominal pain. Negative for vomiting, diarrhea, constipation, anal bleeding and rectal pain.  Genitourinary: Positive for hematuria. Negative for dysuria, decreased urine volume, vaginal bleeding and vaginal discharge.  Musculoskeletal: Negative for back pain.  All other systems reviewed and are negative.    Allergies  Sulfonamide derivatives and Tetanus toxoids  Home Medications   Current Outpatient Rx  Name Route Sig Dispense Refill  . ASPIRIN EC 81 MG PO TBEC Oral Take 81 mg by mouth daily.    . COQ10 100 MG PO CAPS Oral Take 1 capsule by mouth daily.    Marland Kitchen HYDROCHLOROTHIAZIDE 12.5 MG PO CAPS Oral Take 12.5 mg by mouth daily as needed. For excess fluid    . LOSARTAN POTASSIUM 25 MG PO TABS Oral Take 25 mg by mouth daily.    Marland Kitchen METFORMIN HCL 500 MG PO TABS Oral Take 500 mg by mouth 2 (two) times daily with a meal.    . OMEGA-3-ACID ETHYL ESTERS 1 G PO CAPS Oral Take 2 g by mouth 3 (three) times a week. Monday, Wednesday, Friday    . POTASSIUM CHLORIDE CRYS ER 20 MEQ PO TBCR Oral Take  20 mEq by mouth as needed. Take when you take your Hydrochlorothiazide    . CEPHALEXIN 500 MG PO CAPS Oral Take 1 capsule (500 mg total) by mouth 3 (three) times daily. 30 capsule 0  . HYDROCODONE-ACETAMINOPHEN 5-325 MG PO TABS  1-2 tablets po q 6 hours prn moderate to severe pain 15 tablet 0  . PHENAZOPYRIDINE HCL 200 MG PO TABS Oral Take 1 tablet (200 mg total) by mouth 3 (three) times daily as needed for pain. 6 tablet 1    BP 126/66  Pulse 55  Temp 98.1 F (36.7 C)  Resp 18  Ht 5\' 3"  (1.6 m)  Wt 241 lb (109.317 kg)  BMI 42.69 kg/m2  SpO2 99%  Physical Exam  Nursing note and vitals reviewed. Constitutional: She is oriented to person, place, and time. She appears well-developed and well-nourished.  HENT:  Head: Normocephalic and atraumatic.  Eyes: Pupils are equal, round, and reactive to light.  Neck: Normal range of motion. Neck supple.   Cardiovascular: Normal rate and regular rhythm.   No murmur heard. Pulmonary/Chest: Effort normal. No respiratory distress. She has no wheezes.  Abdominal: Soft. Bowel sounds are normal. She exhibits no distension. There is tenderness. There is no rebound and no guarding.  Neurological: She is alert and oriented to person, place, and time.  Skin: Skin is warm. No rash noted.  Psychiatric: She has a normal mood and affect.    ED Course  Procedures (including critical care time)  Labs Reviewed  CBC WITH DIFFERENTIAL - Abnormal; Notable for the following:    Hemoglobin 11.5 (*)     HCT 35.3 (*)     Eosinophils Relative 6 (*)     All other components within normal limits  BASIC METABOLIC PANEL - Abnormal; Notable for the following:    Glucose, Bld 126 (*)     All other components within normal limits  URINALYSIS, ROUTINE W REFLEX MICROSCOPIC - Abnormal; Notable for the following:    APPearance CLOUDY (*)     Hgb urine dipstick MODERATE (*)     Leukocytes, UA LARGE (*)     All other components within normal limits  URINE MICROSCOPIC-ADD ON - Abnormal; Notable for the following:    Bacteria, UA MANY (*)     All other components within normal limits  URINE CULTURE   Ct Abdomen Pelvis Wo Contrast  03/06/2012  *RADIOLOGY REPORT*  Clinical Data: Suprapubic pain, hematuria for 1 month  CT ABDOMEN AND PELVIS WITHOUT CONTRAST  Technique:  Multidetector CT imaging of the abdomen and pelvis was performed following the standard protocol without intravenous contrast.  Comparison: CT abdomen pelvis of 11/27/2008  Findings: The lung bases are clear.  On this unenhanced study the liver is unremarkable.  No calcified gallstones are noted.  The pancreas, in size and the pancreatic duct is not the unremarkable. The stomach is not well distended.  No renal calculi are seen.  No renal mass is evident on this unenhanced study.  The proximal ureters are normal in caliber.  Abdominal aorta appears normal.  No  adenopathy is seen.  The distal ureters appear normal in caliber and no distal ureteral calculi are noted.  The urinary bladder is moderately urine distended with no abnormality noted.  The uterus has been resected. No adnexal lesion is seen.  No fluid is noted within the pelvis. There are rectosigmoid colonic diverticula present.  Diverticula also are noted throughout the entire left colon.  The terminal ileum is unremarkable.  The appendix is not visualized.  There is degenerative disc disease at L3-4 and L5-S1 with slight anterolisthesis of L4 on L5 by 5 mm apparently due to degenerative change involving the facet joints.  IMPRESSION:  1.  No explanation for the patient's hematuria is seen.  No renal or ureteral calculi are noted. 2.  The urinary bladder is unremarkable in the unenhanced state as are the kidneys. 3.  Multiple rectosigmoid and descending colon diverticula. 4.  5 mm anterolisthesis of L4 on L5.  Degenerative disc disease at L3-4 and L5- S1.   Original Report Authenticated By: Juline Patch, M.D.      1. Urinary tract infection   2. Bladder spasm     RA sat is 99% and I interpret to be normal.  11:33 AM Discussed with Dr. Brunilda Payor. He is scheduled to see on 9/26, will plan on scope . This is relayed to pt.  He reports pt should call her if she worsens.  Agrees with pyridium for her.    MDM  Pt felt improved after IV dilaudid.  Pt's CT shows no stones, renal involvement.  Some L4 on 5 anterolithesis, so I informed pt that back problems may be a component, although I doubt.  Will start IV abx and discuss with Dr. Brunilda Payor.          Gavin Pound. Demont Linford, MD 03/06/12 1137

## 2012-03-06 NOTE — ED Notes (Signed)
Patient states she has had abdominal pain of 9/10 for the last couple months.  Patient states she has been seen by a gynecologist and her PCP for the same.  Patient states she has had an ultrasound, blood work, and a pelvic exam previously and they found a bladder infection and a small kidney stone.  Patient has been treated for these conditions.  Patient has sharp lower abdominal pain that has worsened since last night.  Patient has nausea but denies fevers, vomiting, and diarrhea.

## 2012-03-06 NOTE — Discharge Instructions (Signed)
 Urinary Tract Infection Infections of the urinary tract can start in several places. A bladder infection (cystitis), a kidney infection (pyelonephritis), and a prostate infection (prostatitis) are different types of urinary tract infections (UTIs). They usually get better if treated with medicines (antibiotics) that kill germs. Take all the medicine until it is gone. You or your child may feel better in a few days, but TAKE ALL MEDICINE or the infection may not respond and may become more difficult to treat. HOME CARE INSTRUCTIONS   Drink enough water and fluids to keep the urine clear or pale yellow. Cranberry juice is especially recommended, in addition to large amounts of water.   Avoid caffeine, tea, and carbonated beverages. They tend to irritate the bladder.   Alcohol may irritate the prostate.   Only take over-the-counter or prescription medicines for pain, discomfort, or fever as directed by your caregiver.  To prevent further infections:  Empty the bladder often. Avoid holding urine for long periods of time.   After a bowel movement, women should cleanse from front to back. Use each tissue only once.   Empty the bladder before and after sexual intercourse.  FINDING OUT THE RESULTS OF YOUR TEST Not all test results are available during your visit. If your or your child's test results are not back during the visit, make an appointment with your caregiver to find out the results. Do not assume everything is normal if you have not heard from your caregiver or the medical facility. It is important for you to follow up on all test results. SEEK MEDICAL CARE IF:   There is back pain.   Your baby is older than 3 months with a rectal temperature of 100.5 F (38.1 C) or higher for more than 1 day.   Your or your child's problems (symptoms) are no better in 3 days. Return sooner if you or your child is getting worse.  SEEK IMMEDIATE MEDICAL CARE IF:   There is severe back pain or lower  abdominal pain.   You or your child develops chills.   You have a fever.   Your baby is older than 3 months with a rectal temperature of 102 F (38.9 C) or higher.   Your baby is 32 months old or younger with a rectal temperature of 100.4 F (38 C) or higher.   There is nausea or vomiting.   There is continued burning or discomfort with urination.  MAKE SURE YOU:   Understand these instructions.   Will watch your condition.   Will get help right away if you are not doing well or get worse.  Document Released: 03/20/2005 Document Revised: 05/30/2011 Document Reviewed: 10/23/2006 [facility] Patient Information 2012 [place], [place].    Narcotic and benzodiazepine use may cause drowsiness, slowed breathing or dependence.  Please use with caution and do not drive, operate machinery or watch young children alone while taking them.  Taking combinations of these medications or drinking alcohol will potentiate these effects.

## 2012-03-07 LAB — URINE CULTURE: Colony Count: 3000

## 2012-03-11 DIAGNOSIS — E7800 Pure hypercholesterolemia, unspecified: Secondary | ICD-10-CM | POA: Diagnosis not present

## 2012-03-11 DIAGNOSIS — I10 Essential (primary) hypertension: Secondary | ICD-10-CM | POA: Diagnosis not present

## 2012-03-11 DIAGNOSIS — E119 Type 2 diabetes mellitus without complications: Secondary | ICD-10-CM | POA: Diagnosis not present

## 2012-03-11 DIAGNOSIS — R0602 Shortness of breath: Secondary | ICD-10-CM | POA: Diagnosis not present

## 2012-03-17 DIAGNOSIS — H04129 Dry eye syndrome of unspecified lacrimal gland: Secondary | ICD-10-CM | POA: Diagnosis not present

## 2012-03-17 DIAGNOSIS — E119 Type 2 diabetes mellitus without complications: Secondary | ICD-10-CM | POA: Diagnosis not present

## 2012-03-17 DIAGNOSIS — H35379 Puckering of macula, unspecified eye: Secondary | ICD-10-CM | POA: Diagnosis not present

## 2012-03-17 DIAGNOSIS — H2510 Age-related nuclear cataract, unspecified eye: Secondary | ICD-10-CM | POA: Diagnosis not present

## 2012-03-19 DIAGNOSIS — N390 Urinary tract infection, site not specified: Secondary | ICD-10-CM | POA: Diagnosis not present

## 2012-03-23 DIAGNOSIS — IMO0001 Reserved for inherently not codable concepts without codable children: Secondary | ICD-10-CM | POA: Diagnosis not present

## 2012-03-23 DIAGNOSIS — N390 Urinary tract infection, site not specified: Secondary | ICD-10-CM | POA: Diagnosis not present

## 2012-03-23 DIAGNOSIS — I10 Essential (primary) hypertension: Secondary | ICD-10-CM | POA: Diagnosis not present

## 2012-03-23 DIAGNOSIS — E039 Hypothyroidism, unspecified: Secondary | ICD-10-CM | POA: Diagnosis not present

## 2012-04-08 DIAGNOSIS — N6489 Other specified disorders of breast: Secondary | ICD-10-CM | POA: Diagnosis not present

## 2012-04-08 DIAGNOSIS — Z09 Encounter for follow-up examination after completed treatment for conditions other than malignant neoplasm: Secondary | ICD-10-CM | POA: Diagnosis not present

## 2012-04-09 DIAGNOSIS — E119 Type 2 diabetes mellitus without complications: Secondary | ICD-10-CM | POA: Diagnosis not present

## 2012-04-09 DIAGNOSIS — R0602 Shortness of breath: Secondary | ICD-10-CM | POA: Diagnosis not present

## 2012-04-09 DIAGNOSIS — I10 Essential (primary) hypertension: Secondary | ICD-10-CM | POA: Diagnosis not present

## 2012-04-17 DIAGNOSIS — I10 Essential (primary) hypertension: Secondary | ICD-10-CM | POA: Diagnosis not present

## 2012-04-17 DIAGNOSIS — R0602 Shortness of breath: Secondary | ICD-10-CM | POA: Diagnosis not present

## 2012-04-17 DIAGNOSIS — E119 Type 2 diabetes mellitus without complications: Secondary | ICD-10-CM | POA: Diagnosis not present

## 2012-04-23 DIAGNOSIS — R0602 Shortness of breath: Secondary | ICD-10-CM | POA: Diagnosis not present

## 2012-04-23 DIAGNOSIS — I10 Essential (primary) hypertension: Secondary | ICD-10-CM | POA: Diagnosis not present

## 2012-04-23 DIAGNOSIS — E119 Type 2 diabetes mellitus without complications: Secondary | ICD-10-CM | POA: Diagnosis not present

## 2012-04-23 DIAGNOSIS — E7800 Pure hypercholesterolemia, unspecified: Secondary | ICD-10-CM | POA: Diagnosis not present

## 2012-04-29 DIAGNOSIS — E039 Hypothyroidism, unspecified: Secondary | ICD-10-CM | POA: Diagnosis not present

## 2012-06-03 DIAGNOSIS — IMO0001 Reserved for inherently not codable concepts without codable children: Secondary | ICD-10-CM | POA: Diagnosis not present

## 2012-06-03 DIAGNOSIS — I10 Essential (primary) hypertension: Secondary | ICD-10-CM | POA: Diagnosis not present

## 2012-06-03 DIAGNOSIS — Z8249 Family history of ischemic heart disease and other diseases of the circulatory system: Secondary | ICD-10-CM | POA: Diagnosis not present

## 2012-06-03 DIAGNOSIS — Z79899 Other long term (current) drug therapy: Secondary | ICD-10-CM | POA: Diagnosis not present

## 2012-07-30 DIAGNOSIS — I10 Essential (primary) hypertension: Secondary | ICD-10-CM | POA: Diagnosis not present

## 2012-07-30 DIAGNOSIS — Z79899 Other long term (current) drug therapy: Secondary | ICD-10-CM | POA: Diagnosis not present

## 2012-07-30 DIAGNOSIS — N390 Urinary tract infection, site not specified: Secondary | ICD-10-CM | POA: Diagnosis not present

## 2012-07-30 DIAGNOSIS — IMO0001 Reserved for inherently not codable concepts without codable children: Secondary | ICD-10-CM | POA: Diagnosis not present

## 2012-07-30 DIAGNOSIS — N318 Other neuromuscular dysfunction of bladder: Secondary | ICD-10-CM | POA: Diagnosis not present

## 2012-09-17 DIAGNOSIS — R350 Frequency of micturition: Secondary | ICD-10-CM | POA: Diagnosis not present

## 2012-09-17 DIAGNOSIS — N949 Unspecified condition associated with female genital organs and menstrual cycle: Secondary | ICD-10-CM | POA: Diagnosis not present

## 2012-09-17 DIAGNOSIS — Z8719 Personal history of other diseases of the digestive system: Secondary | ICD-10-CM | POA: Diagnosis not present

## 2012-09-17 DIAGNOSIS — R3129 Other microscopic hematuria: Secondary | ICD-10-CM | POA: Diagnosis not present

## 2012-09-21 DIAGNOSIS — E7800 Pure hypercholesterolemia, unspecified: Secondary | ICD-10-CM | POA: Diagnosis not present

## 2012-09-21 DIAGNOSIS — E119 Type 2 diabetes mellitus without complications: Secondary | ICD-10-CM | POA: Diagnosis not present

## 2012-09-21 DIAGNOSIS — R0602 Shortness of breath: Secondary | ICD-10-CM | POA: Diagnosis not present

## 2012-09-21 DIAGNOSIS — I10 Essential (primary) hypertension: Secondary | ICD-10-CM | POA: Diagnosis not present

## 2012-09-30 DIAGNOSIS — IMO0001 Reserved for inherently not codable concepts without codable children: Secondary | ICD-10-CM | POA: Diagnosis not present

## 2012-09-30 DIAGNOSIS — G4733 Obstructive sleep apnea (adult) (pediatric): Secondary | ICD-10-CM | POA: Diagnosis not present

## 2012-09-30 DIAGNOSIS — G4730 Sleep apnea, unspecified: Secondary | ICD-10-CM | POA: Diagnosis not present

## 2012-09-30 DIAGNOSIS — I10 Essential (primary) hypertension: Secondary | ICD-10-CM | POA: Diagnosis not present

## 2012-09-30 DIAGNOSIS — Z79899 Other long term (current) drug therapy: Secondary | ICD-10-CM | POA: Diagnosis not present

## 2012-09-30 DIAGNOSIS — Z6841 Body Mass Index (BMI) 40.0 and over, adult: Secondary | ICD-10-CM | POA: Diagnosis not present

## 2012-10-01 IMAGING — CT CT ABD-PELV W/O CM
1 series · 14 of 17 positions shown, 19 images · non-contrast
Comparison: CT abdomen pelvis of 11/27/2008

CLINICAL DATA: Suprapubic pain, hematuria for 1 month

CT ABDOMEN AND PELVIS WITHOUT CONTRAST
TECHNIQUE: Multidetector CT imaging of the abdomen and pelvis was
performed following the standard protocol without intravenous
contrast.

[Series 4: lung · axial · 0.73mm/px · z∈[-126,-56]mm · 14 of 17 slices shown, 19 images]
[im 2/17  soft-tissue]
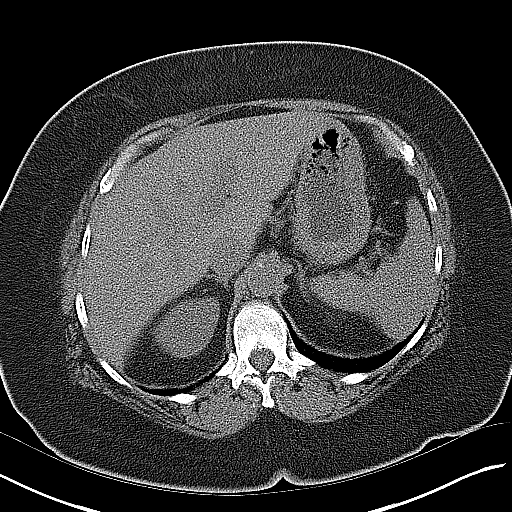
[im 2/17  bone]
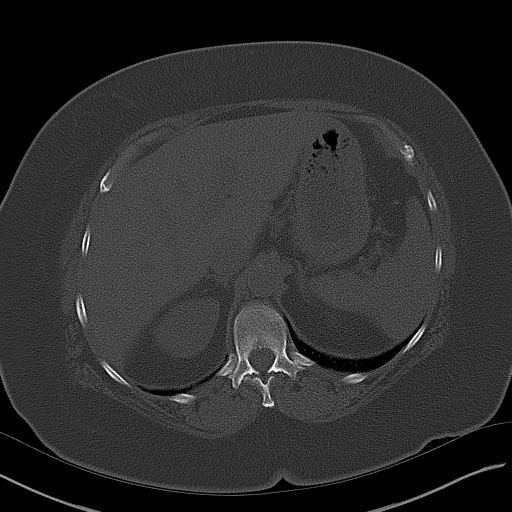
[im 3/17  soft-tissue]
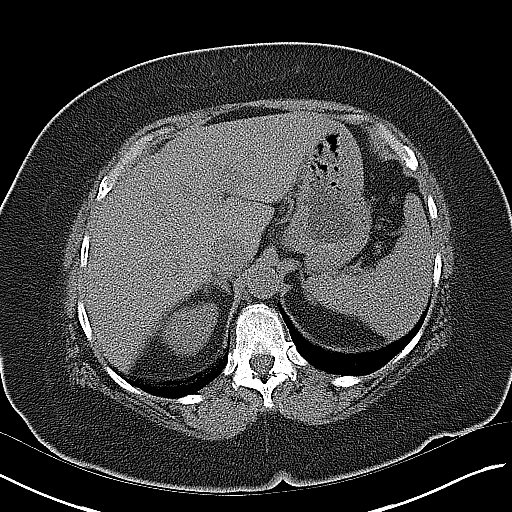
[im 4/17  soft-tissue]
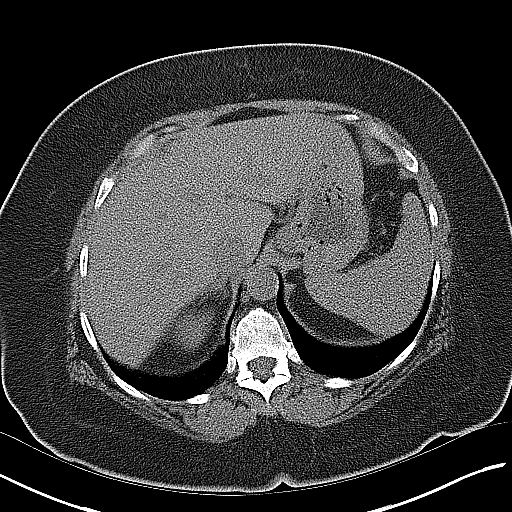
[im 6/17  soft-tissue]
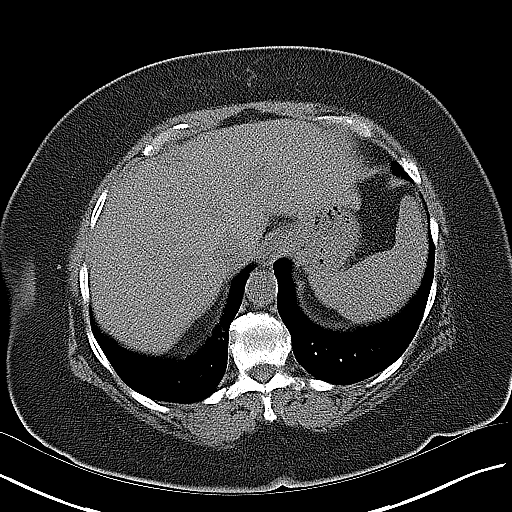
[im 7/17  soft-tissue]
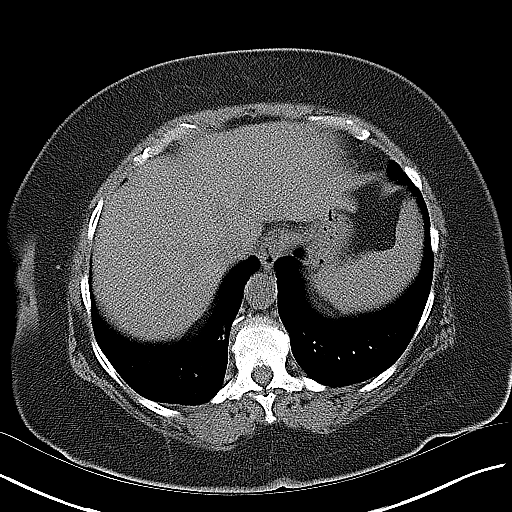
[im 8/17  soft-tissue]
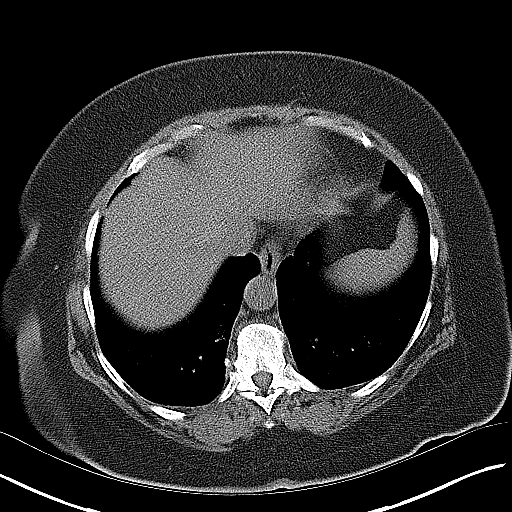
[im 9/17  soft-tissue]
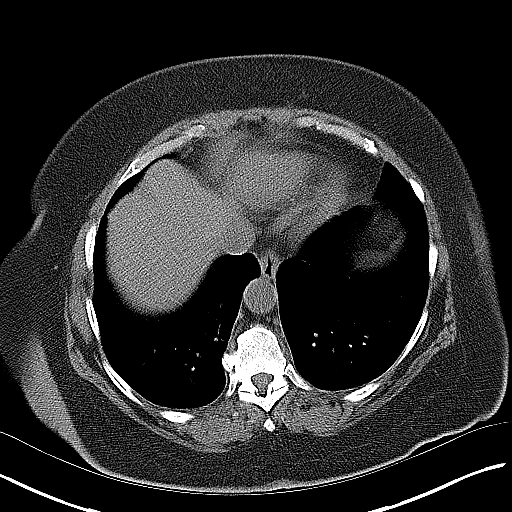
[im 10/17  soft-tissue]
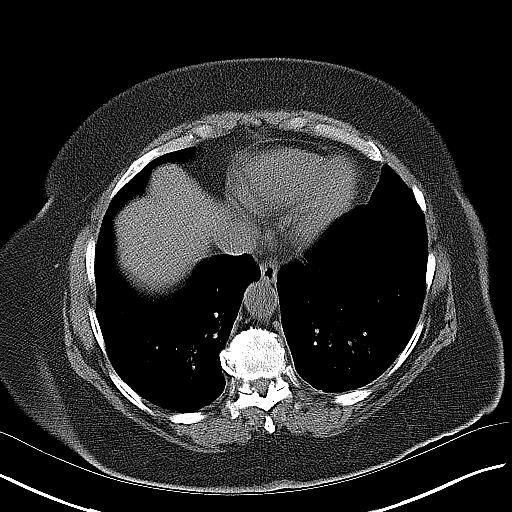
[im 11/17  soft-tissue]
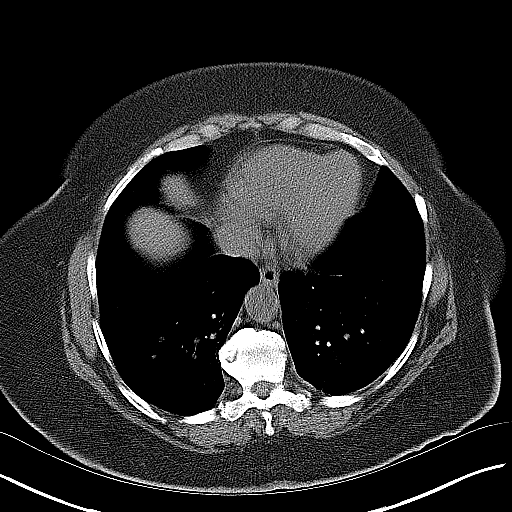
[im 11/17  bone]
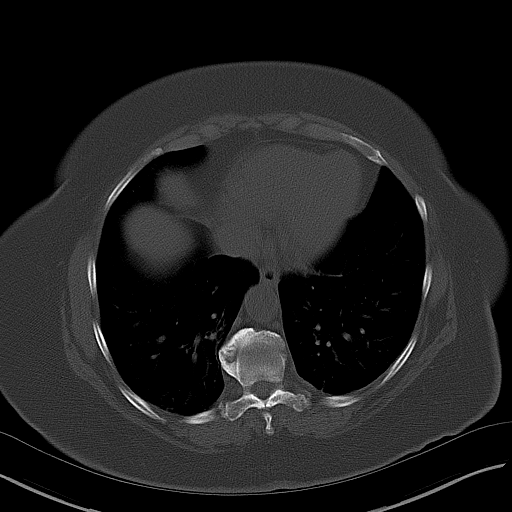
[im 12/17  soft-tissue]
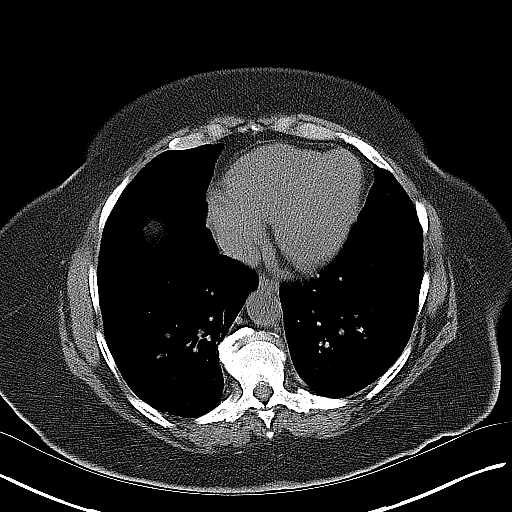
[im 13/17  lung]
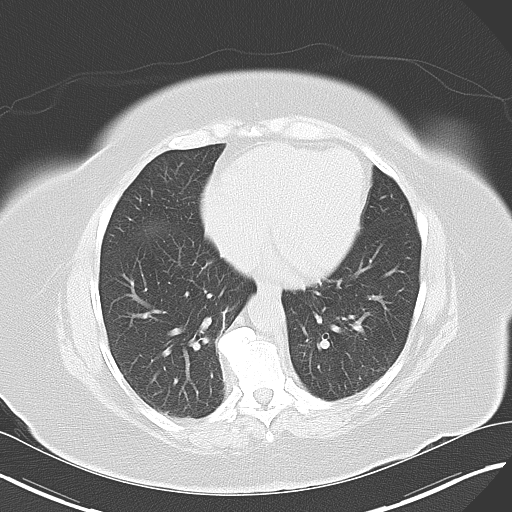
[im 14/17  soft-tissue]
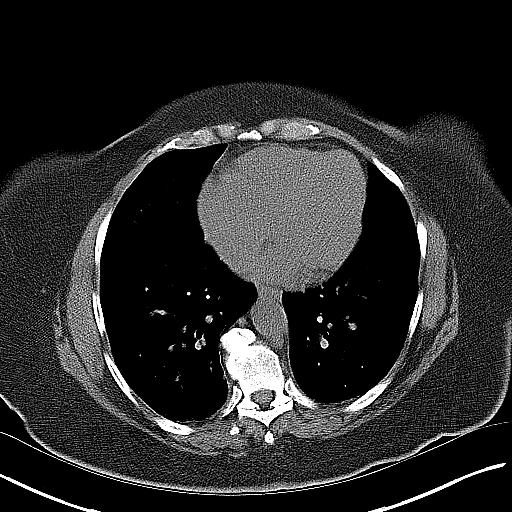
[im 14/17  lung]
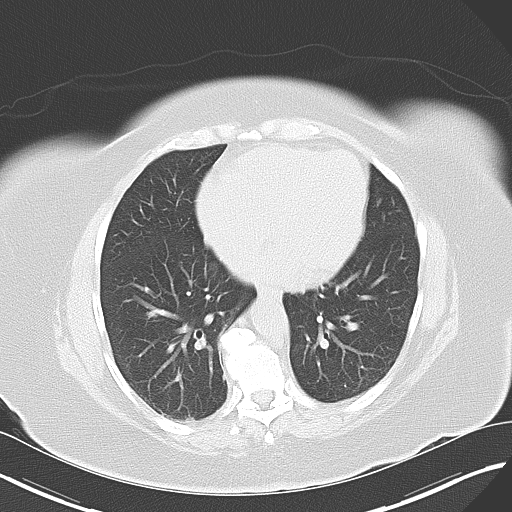
[im 15/17  soft-tissue]
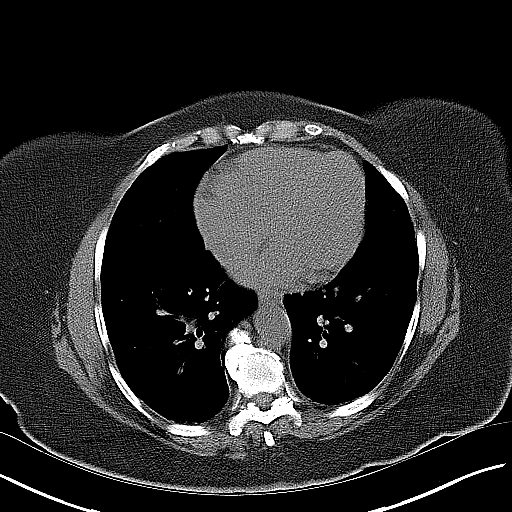
[im 15/17  lung]
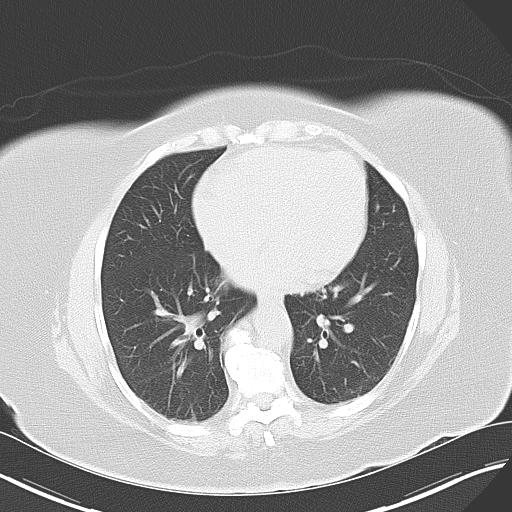
[im 16/17  soft-tissue]
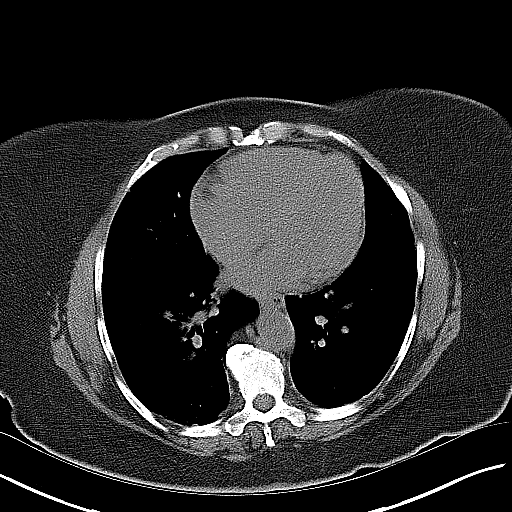
[im 16/17  lung]
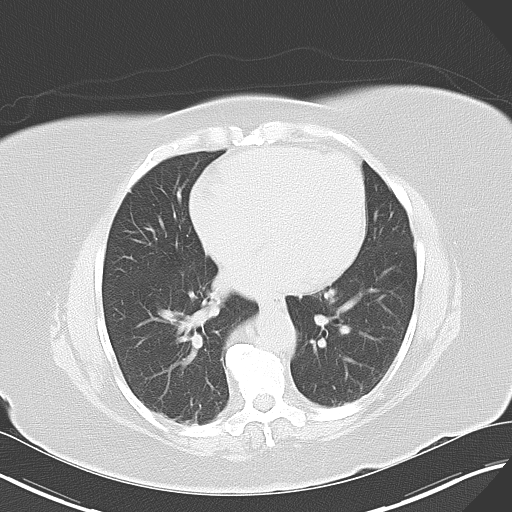

[14 of 17 positions shown; findings below may reference images not displayed]

FINDINGS: The lung bases are clear.  On this unenhanced study the
liver is unremarkable.  No calcified gallstones are noted.  The
pancreas, in size and the pancreatic duct is not the unremarkable.
The stomach is not well distended.  No renal calculi are seen.  No
renal mass is evident on this unenhanced study.  The proximal
ureters are normal in caliber.  Abdominal aorta appears normal.  No
adenopathy is seen.

The distal ureters appear normal in caliber and no distal ureteral
calculi are noted.  The urinary bladder is moderately urine
distended with no abnormality noted.  The uterus has been resected.
No adnexal lesion is seen.  No fluid is noted within the pelvis.
There are rectosigmoid colonic diverticula present.  Diverticula
also are noted throughout the entire left colon.  The terminal
ileum is unremarkable.  The appendix is not visualized.  There is
degenerative disc disease at L3-4 and L5-S1 with slight
anterolisthesis of L4 on L5 by 5 mm apparently due to degenerative
change involving the facet joints.
IMPRESSION: 1.  No explanation for the patient's hematuria is seen.  No renal
or ureteral calculi are noted.
2.  The urinary bladder is unremarkable in the unenhanced state as
are the kidneys.
3.  Multiple rectosigmoid and descending colon diverticula.
4.  5 mm anterolisthesis of L4 on L5.  Degenerative disc disease at
L3-4 and L5- S1.

## 2012-10-08 DIAGNOSIS — N6489 Other specified disorders of breast: Secondary | ICD-10-CM | POA: Diagnosis not present

## 2012-10-27 DIAGNOSIS — G4733 Obstructive sleep apnea (adult) (pediatric): Secondary | ICD-10-CM | POA: Diagnosis not present

## 2012-12-16 ENCOUNTER — Encounter (HOSPITAL_COMMUNITY): Payer: Self-pay | Admitting: Emergency Medicine

## 2012-12-16 DIAGNOSIS — R079 Chest pain, unspecified: Secondary | ICD-10-CM | POA: Diagnosis not present

## 2012-12-16 DIAGNOSIS — Z87442 Personal history of urinary calculi: Secondary | ICD-10-CM | POA: Diagnosis not present

## 2012-12-16 DIAGNOSIS — M67919 Unspecified disorder of synovium and tendon, unspecified shoulder: Secondary | ICD-10-CM | POA: Diagnosis not present

## 2012-12-16 DIAGNOSIS — Z79899 Other long term (current) drug therapy: Secondary | ICD-10-CM | POA: Insufficient documentation

## 2012-12-16 DIAGNOSIS — M7530 Calcific tendinitis of unspecified shoulder: Secondary | ICD-10-CM | POA: Diagnosis not present

## 2012-12-16 DIAGNOSIS — M719 Bursopathy, unspecified: Secondary | ICD-10-CM | POA: Diagnosis not present

## 2012-12-16 DIAGNOSIS — E119 Type 2 diabetes mellitus without complications: Secondary | ICD-10-CM | POA: Insufficient documentation

## 2012-12-16 DIAGNOSIS — M19019 Primary osteoarthritis, unspecified shoulder: Secondary | ICD-10-CM | POA: Diagnosis not present

## 2012-12-16 DIAGNOSIS — Z87448 Personal history of other diseases of urinary system: Secondary | ICD-10-CM | POA: Diagnosis not present

## 2012-12-16 DIAGNOSIS — Z7982 Long term (current) use of aspirin: Secondary | ICD-10-CM | POA: Diagnosis not present

## 2012-12-16 DIAGNOSIS — Z8669 Personal history of other diseases of the nervous system and sense organs: Secondary | ICD-10-CM | POA: Diagnosis not present

## 2012-12-16 DIAGNOSIS — M47812 Spondylosis without myelopathy or radiculopathy, cervical region: Secondary | ICD-10-CM | POA: Diagnosis not present

## 2012-12-16 NOTE — ED Notes (Signed)
PT. REPORTS LEFT ARM PAIN ONSET THIS EVENING , DENIES INJURY ,  NO SOB OR CHEST PAIN . PT. STATED HISTORY OF CARPAL TUNNEL RELEASE ON THE SAME EXTREMITY SEVERAL YEARS AGO.

## 2012-12-17 ENCOUNTER — Emergency Department (HOSPITAL_COMMUNITY): Payer: Medicare Other

## 2012-12-17 ENCOUNTER — Emergency Department (HOSPITAL_COMMUNITY)
Admission: EM | Admit: 2012-12-17 | Discharge: 2012-12-17 | Disposition: A | Discharge status: Home / Self Care | Payer: Medicare Other | Attending: Emergency Medicine | Admitting: Emergency Medicine

## 2012-12-17 DIAGNOSIS — M7552 Bursitis of left shoulder: Secondary | ICD-10-CM

## 2012-12-17 DIAGNOSIS — M7989 Other specified soft tissue disorders: Secondary | ICD-10-CM | POA: Diagnosis not present

## 2012-12-17 DIAGNOSIS — M7530 Calcific tendinitis of unspecified shoulder: Secondary | ICD-10-CM | POA: Diagnosis not present

## 2012-12-17 DIAGNOSIS — M47812 Spondylosis without myelopathy or radiculopathy, cervical region: Secondary | ICD-10-CM | POA: Diagnosis not present

## 2012-12-17 DIAGNOSIS — M79609 Pain in unspecified limb: Secondary | ICD-10-CM | POA: Diagnosis not present

## 2012-12-17 DIAGNOSIS — M19019 Primary osteoarthritis, unspecified shoulder: Secondary | ICD-10-CM | POA: Diagnosis not present

## 2012-12-17 DIAGNOSIS — R079 Chest pain, unspecified: Secondary | ICD-10-CM | POA: Diagnosis not present

## 2012-12-17 LAB — BASIC METABOLIC PANEL
BUN: 12 mg/dL (ref 6–23)
Chloride: 103 mEq/L (ref 96–112)
GFR calc Af Amer: >90 mL/min (ref >90)
GFR calc non Af Amer: >90 mL/min (ref >90)
Glucose, Bld: 125 mg/dL — ABNORMAL HIGH (ref 70–99)
Potassium: 4 mEq/L (ref 3.5–5.1)
Sodium: 138 mEq/L (ref 135–145)

## 2012-12-17 LAB — CBC
HCT: 37.8 % (ref 36.0–46.0)
Hemoglobin: 12.4 g/dL (ref 12.0–15.0)
RDW: 14.5 % (ref 11.5–15.5)
WBC: 8.7 10*3/uL (ref 4.0–10.5)

## 2012-12-17 MED ORDER — ONDANSETRON HCL 4 MG/2ML IJ SOLN
4.0000 mg | Freq: Once | INTRAMUSCULAR | Status: AC
  Administered 2012-12-17: 4 mg via INTRAVENOUS
  Filled 2012-12-17: qty 2

## 2012-12-17 MED ORDER — DIPHENHYDRAMINE HCL 50 MG/ML IJ SOLN
25.0000 mg | Freq: Once | INTRAMUSCULAR | Status: AC
  Administered 2012-12-17: 25 mg via INTRAVENOUS
  Filled 2012-12-17: qty 1

## 2012-12-17 MED ORDER — OXYCODONE-ACETAMINOPHEN 5-325 MG PO TABS: 1.0000 | ORAL_TABLET | Freq: Four times a day (QID) | ORAL | Status: DC | PRN

## 2012-12-17 MED ORDER — LORAZEPAM 2 MG/ML IJ SOLN
1.0000 mg | Freq: Once | INTRAMUSCULAR | Status: AC
  Administered 2012-12-17: 1 mg via INTRAVENOUS
  Filled 2012-12-17 (×2): qty 1

## 2012-12-17 MED ORDER — MORPHINE SULFATE 4 MG/ML IJ SOLN
4.0000 mg | Freq: Once | INTRAMUSCULAR | Status: AC
  Administered 2012-12-17: 4 mg via INTRAVENOUS
  Filled 2012-12-17: qty 1

## 2012-12-17 NOTE — ED Notes (Signed)
IV team at bedside to get IV access. 

## 2012-12-17 NOTE — ED Notes (Signed)
PATIENT IN VASCULAR LAB. WILL COME TO POD C WHEN STUDY COMPLETED

## 2012-12-17 NOTE — ED Notes (Signed)
IV team paged for assistance

## 2012-12-17 NOTE — ED Notes (Signed)
Pt c/o Left arm pain starting in her shoulder radiating down into her hand, swelling to her Left hand starting yesterday. Pt denies chest pain, SOB, N/V/D, cough or congestion. Pt states she can't left her arm, she had carpal tunnel in the same arm 15 years ago and a nerve graft 14 years ago. Pt reports the pain feels similar, pt denies any injury

## 2012-12-17 NOTE — Progress Notes (Signed)
Left upper extremity venous duplex:  No evidence of DVT or superficial thrombosis.    

## 2012-12-17 NOTE — ED Notes (Signed)
Pt still off the floor at MRI.

## 2012-12-17 NOTE — ED Provider Notes (Signed)
History    CSN: [id number] Arrival date & time 12/16/12  2305  First MD Initiated Contact with Patient 12/17/12 0256     Chief Complaint  Patient presents with  . Arm Pain   (Consider location/radiation/quality/duration/timing/severity/associated sxs/prior Treatment) HPI Pt presents with c/o left shoulder and arm pain.  Pt states pain began yesterday and has slowly been increasing.  She describes the pain as aching and throbbing.  States she had carpal tunnel surgery several years ago on left wrist requiring nerve graft and the pain feels similar.  She states her left arm is weak and she is not able to lift her left arm up.  She denies new injury, denies chest pain or shortness of breath.  She states that her left hand looks swollen and that this has been happening since her carpal tunnel surgery.   Past Medical History  Diagnosis Date  . Kidney stones   . Kidney stone   . Bladder infection   . Carpal tunnel syndrome   . Diabetes mellitus    Past Surgical History  Procedure Laterality Date  . Tonsillectomy    . Abdominal hysterectomy      partial  . Hernia repair    . Appendectomy     No family history on file. History  Substance Use Topics  . Smoking status: Never Smoker   . Smokeless tobacco: Not on file  . Alcohol Use: No   OB History   Grav Para Term Preterm Abortions TAB SAB Ect Mult Living                 Review of Systems ROS reviewed and all otherwise negative except for mentioned in HPI  Allergies  Sulfonamide derivatives and Tetanus toxoids  Home Medications   Current Outpatient Rx  Name  Route  Sig  Dispense  Refill  . Acetaminophen (TYLENOL PO)   Oral   Take 1 tablet by mouth once.         Marland Kitchen aspirin EC 81 MG tablet   Oral   Take 81 mg by mouth daily.         . Coenzyme Q10 (COQ10) 100 MG CAPS   Oral   Take 1 capsule by mouth daily.         . hydrochlorothiazide (MICROZIDE) 12.5 MG capsule   Oral   Take 12.5 mg by mouth daily as  needed. For excess fluid         . losartan (COZAAR) 25 MG tablet   Oral   Take 25 mg by mouth daily.         . metFORMIN (GLUCOPHAGE) 500 MG tablet   Oral   Take 500 mg by mouth 2 (two) times daily with a meal.         . omega-3 acid ethyl esters (LOVAZA) 1 G capsule   Oral   Take 2 g by mouth 3 (three) times a week. Monday, Wednesday, Friday         . potassium chloride SA (K-DUR,KLOR-CON) 20 MEQ tablet   Oral   Take 20 mEq by mouth as needed. Take when you take your Hydrochlorothiazide         . oxyCODONE-acetaminophen (PERCOCET/ROXICET) 5-325 MG per tablet   Oral   Take 1-2 tablets by mouth every 6 (six) hours as needed for pain.   10 tablet   0    BP 123/70  Pulse 55  Temp(Src) 97.8 F (36.6 C) (Oral)  Resp 20  SpO2  99% Vitals reviewed Physical Exam Physical Examination: General appearance - alert, uncomfortable appearing, and in no distress Mental status - alert, oriented to person, place, and time Eyes - no conjunctival injection Mouth - mucous membranes moist, pharynx normal without lesions Neck - supple, no significant adenopathy Chest - clear to auscultation, no wheezes, rales or rhonchi, symmetric air entry Heart - normal rate, regular rhythm, normal S1, S2, no murmurs, rubs, clicks or gallops Abdomen - soft, nontender, nondistended, no masses or organomegaly Neurological - alert, oriented x 3, cranial nerves grossly intact, strength in LUE decreased- able to raise arm against gravity but not able to sustain this for more than 2-3 seconds- unclear how much of this is related to her pain or true weakness, 4/5 grip strength of left hand, sensation intact in left upper extremity/hand/fingers, but patient describes feeling parasthesias in her left fingers Musculoskeletal - exquisitely tender to palpation at anterior and posterior left shoulder and left wrist, otherwise no joint tenderness or deformity, mild swelling of left forearm and dorsum of left  hand Extremities - peripheral pulses normal, no pedal edema, no clubbing or cyanosis, brisk cap refill of bilateral upper extremities.  Skin - normal coloration and turgor, no rashes  ED Course  Procedures (including critical care time)  5:00 AM upon reviewing chart there is a prior note from several years ago stating that patient has chronic left shoudler and arm pain related to reflex sympathetic dystrophy. Will continue with workup but this may explain some of the symptomatology today.  Labs Reviewed  BASIC METABOLIC PANEL - Abnormal; Notable for the following:    Glucose, Bld 125 (*)    All other components within normal limits  D-DIMER, QUANTITATIVE - Abnormal; Notable for the following:    D-Dimer, Quant 0.70 (*)    All other components within normal limits  CBC   Dg Chest 2 View  12/17/2012   *RADIOLOGY REPORT*  Clinical Data: Left arm pain.  CHEST - 2 VIEW  Comparison: 01/02/2011  Findings: Heart and mediastinal contours are within normal limits. No focal opacities or effusions.  No acute bony abnormality. Degenerative spurring in the thoracic spine.  IMPRESSION: No active cardiopulmonary disease.   Original Report Authenticated By: Charlett Nose, M.D.   Mr Cervical Spine Wo Contrast  12/17/2012   *RADIOLOGY REPORT*  Clinical Data: Left shoulder pain radiating into the left arm. Numbness and tingling in the left hand for 3 days.  MRI CERVICAL SPINE WITHOUT CONTRAST  Technique:  Multiplanar and multiecho pulse sequences of the cervical spine, to include the craniocervical junction and cervicothoracic junction, were obtained according to standard protocol without intravenous contrast.  Comparison: None.  Findings: Exaggerated cervical lordosis is present.  Cervical cord is within normal limits.  No intramedullary lesions or edema. Posterior fossa structures grossly appear within normal limits. Bone marrow signal is normal.  Flow voids are present in both vertebral arteries.  The study is  technically suboptimal due to body habitus and inability use standard coil.  C2-C3:  Negative.  C3-C4:  Negative.  C4-C5:  Central canal and left foramen patent.  Mild right foraminal encroachment associated with uncovertebral spurring that could irritate the right C5 nerve.  Mild facet degeneration.  C5-C6:  Posterior ligamentum flavum redundancy.  Very mild central stenosis.  The left neural foramen appears patent.  Mild right foraminal encroachment associated with facet arthrosis and uncovertebral spurring could irritate the right C6 nerve.  There is no cord deformity.  Shallow broad-based disc osteophyte complex.  C6-C7:  This level is poorly evaluated on axial imaging.  Again, posterior ligamentum flavum redundancy is present. Shallow broad- based disc osteophyte complex.  The foramina and central canal appear adequately patent allowing for artifact.  C7-T1:  Negative.  IMPRESSION: Mild cervical spondylosis at C5-C6 and C6-C7.  There is no left- sided foraminal stenosis that would be expected to produce left upper extremity radicular symptoms.   Original Report Authenticated By: Andreas Newport, M.D.   Mr Shoulder Left Wo Contrast  12/17/2012   *RADIOLOGY REPORT*  Clinical Data:  Left shoulder pain with numbness, tingling and weakness in the left arm for 3 days.  Left hand swelling.  MRI OF THE LEFT SHOULDER WITHOUT CONTRAST  Technique:  Multiplanar, multisequence MR imaging of the left shoulder was performed.  No intravenous contrast was administered.  Comparison:  Radiographs 12/17/2012.  FINDINGS: Only axial and coronal imaging was completed.  The patient was not able to tolerate completion of the study; no sagittal imaging was obtained. Rotator cuff:  The rotator cuff appears intact.  There is mild supraspinatus tendinosis.  No significant abnormality of the subscapularis, infraspinatus or teres minor tendons is demonstrated.  There is focal low signal along the bursal surface of the distal supraspinatus  tendon corresponding with calcification on the plain films and consistent with hydroxyapatite deposition. Muscles:  No focal muscular atrophy or edema. Biceps long head:  Intact.  Acromioclavicular Joint:  Moderate acromioclavicular degenerative changes are present.  There is a moderate amount of irregular fluid laterally in the subacromial - subdeltoid bursa adjacent to the calcification described above.  These findings are consistent with calcific bursitis. Glenohumeral Joint:  Mild glenohumeral degenerative changes are noted.  There is no significant shoulder joint effusion.  Labrum:  Mild labral degeneration superiorly and posteriorly without discrete tear. Bones:  No significant extraarticular osseous findings.  IMPRESSION:  1.  The patient was unable to complete the examination.  No sagittal imaging was obtained. 2.  Calcific subacromial - subdeltoid bursitis with associated mild supraspinatus tendinosis.  No evidence of rotator cuff tear. 3.  Mild glenohumeral degenerative changes and labral degeneration.   Original Report Authenticated By: Carey Bullocks, M.D.   Dg Shoulder Left  12/17/2012   *RADIOLOGY REPORT*  Clinical Data: Arm pain.  LEFT SHOULDER - 2+ VIEW  Comparison: None.  Findings: Mild degenerative changes in the left AC and glenohumeral joints. No acute bony abnormality.  Specifically, no fracture, subluxation, or dislocation.  Soft tissues are intact.  IMPRESSION: Degenerative changes. No acute bony abnormality.   Original Report Authenticated By: Charlett Nose, M.D.   1. Bursitis, shoulder, left     MDM  Pt presenting with pain in left shoulder and left arm.  Pain is worse with movement and palpation.  She has 2+ distal pulses.  She has parasthesias and some weakness in left upper extremity.  Pt treated with pain medication.  Doubt aortic dissection as no chest pain or sob, also mediastinal contours wnl on CXR.  Doubt DVT as she states swelling has been occurring since carpal tunnel  syndrome.  Due to weakness and significant pain will obtain MRI of cervical spine and shoulder.  Pt signed out at end of shift pending MRI results and disposition.     Angelica Chick, MD 12/17/12 [phone]

## 2012-12-17 NOTE — ED Notes (Signed)
Patient stated she was having itching and trouble breathing.   Linker, MD advised.   O2 @ 2L [place] placed on patient for comfort.

## 2012-12-17 NOTE — ED Notes (Signed)
Called MRI, they advised that they have one more person in front of patient.   They advised it would be about an hour before they can come get patient.

## 2012-12-17 NOTE — ED Notes (Signed)
Pt c/o her Left hand feeling cold and increase swelling. Pt's hand is cool to touch, swelling noted, palpable pulse, can move fingers.  EDP Linker aware, reports she will be in shortly to see pt

## 2012-12-17 NOTE — ED Notes (Signed)
MRI called to tell this nurse the patient was claustrophobic during the MRI. EDP notified and ordered 1mg  of Ativan. This nurse went to administer the ativan but the patient's existing IV was non-flushable therefore another IV needed to be inserted. Nurse Rosey Bath in radiology started a 24g IV in patient's left forearm. Ativan administered per order.

## 2012-12-17 NOTE — ED Notes (Signed)
Called vascular x 1.

## 2012-12-17 NOTE — ED Notes (Signed)
Patient claims all symptoms of reaction to morphine have resolved.    Patient claims no more itching or trouble breathing.

## 2012-12-17 NOTE — ED Notes (Signed)
CALLED VASCULAR LAB TO CHECK ON DELAY IN GETTING PT STUDY. SPOKE WITH SANDRA AND SHE ADVISES PT IS ON THE TRANSPORT LIST.

## 2013-02-15 DIAGNOSIS — Z13820 Encounter for screening for osteoporosis: Secondary | ICD-10-CM | POA: Diagnosis not present

## 2013-02-15 DIAGNOSIS — E039 Hypothyroidism, unspecified: Secondary | ICD-10-CM | POA: Diagnosis not present

## 2013-02-15 DIAGNOSIS — Z0000 Encounter for general adult medical examination without abnormal findings: Secondary | ICD-10-CM | POA: Diagnosis not present

## 2013-02-15 DIAGNOSIS — Z0100 Encounter for examination of eyes and vision without abnormal findings: Secondary | ICD-10-CM | POA: Diagnosis not present

## 2013-02-15 DIAGNOSIS — N951 Menopausal and female climacteric states: Secondary | ICD-10-CM | POA: Diagnosis not present

## 2013-02-15 DIAGNOSIS — M79609 Pain in unspecified limb: Secondary | ICD-10-CM | POA: Diagnosis not present

## 2013-02-15 DIAGNOSIS — E7800 Pure hypercholesterolemia, unspecified: Secondary | ICD-10-CM | POA: Diagnosis not present

## 2013-02-15 DIAGNOSIS — IMO0001 Reserved for inherently not codable concepts without codable children: Secondary | ICD-10-CM | POA: Diagnosis not present

## 2013-02-15 DIAGNOSIS — N958 Other specified menopausal and perimenopausal disorders: Secondary | ICD-10-CM | POA: Diagnosis not present

## 2013-02-15 DIAGNOSIS — Z0110 Encounter for examination of ears and hearing without abnormal findings: Secondary | ICD-10-CM | POA: Diagnosis not present

## 2013-02-15 DIAGNOSIS — I10 Essential (primary) hypertension: Secondary | ICD-10-CM | POA: Diagnosis not present

## 2013-03-22 DIAGNOSIS — E7800 Pure hypercholesterolemia, unspecified: Secondary | ICD-10-CM | POA: Diagnosis not present

## 2013-03-22 DIAGNOSIS — E119 Type 2 diabetes mellitus without complications: Secondary | ICD-10-CM | POA: Diagnosis not present

## 2013-03-22 DIAGNOSIS — G4733 Obstructive sleep apnea (adult) (pediatric): Secondary | ICD-10-CM | POA: Diagnosis not present

## 2013-03-22 DIAGNOSIS — R0602 Shortness of breath: Secondary | ICD-10-CM | POA: Diagnosis not present

## 2013-04-01 DIAGNOSIS — M79609 Pain in unspecified limb: Secondary | ICD-10-CM | POA: Diagnosis not present

## 2013-04-06 DIAGNOSIS — G4733 Obstructive sleep apnea (adult) (pediatric): Secondary | ICD-10-CM | POA: Diagnosis not present

## 2013-05-03 DIAGNOSIS — E119 Type 2 diabetes mellitus without complications: Secondary | ICD-10-CM | POA: Diagnosis not present

## 2013-05-03 DIAGNOSIS — R0902 Hypoxemia: Secondary | ICD-10-CM | POA: Diagnosis not present

## 2013-05-03 DIAGNOSIS — G4733 Obstructive sleep apnea (adult) (pediatric): Secondary | ICD-10-CM | POA: Diagnosis not present

## 2013-05-03 DIAGNOSIS — R0602 Shortness of breath: Secondary | ICD-10-CM | POA: Diagnosis not present

## 2013-05-13 DIAGNOSIS — H2589 Other age-related cataract: Secondary | ICD-10-CM | POA: Diagnosis not present

## 2013-05-13 DIAGNOSIS — H04129 Dry eye syndrome of unspecified lacrimal gland: Secondary | ICD-10-CM | POA: Diagnosis not present

## 2013-05-13 DIAGNOSIS — E119 Type 2 diabetes mellitus without complications: Secondary | ICD-10-CM | POA: Diagnosis not present

## 2013-05-13 DIAGNOSIS — H11159 Pinguecula, unspecified eye: Secondary | ICD-10-CM | POA: Diagnosis not present

## 2013-05-13 DIAGNOSIS — H43819 Vitreous degeneration, unspecified eye: Secondary | ICD-10-CM | POA: Diagnosis not present

## 2013-05-13 DIAGNOSIS — H1045 Other chronic allergic conjunctivitis: Secondary | ICD-10-CM | POA: Diagnosis not present

## 2013-05-13 DIAGNOSIS — H35379 Puckering of macula, unspecified eye: Secondary | ICD-10-CM | POA: Diagnosis not present

## 2013-05-26 DIAGNOSIS — R0902 Hypoxemia: Secondary | ICD-10-CM | POA: Diagnosis not present

## 2013-05-26 DIAGNOSIS — I10 Essential (primary) hypertension: Secondary | ICD-10-CM | POA: Diagnosis not present

## 2013-05-26 DIAGNOSIS — IMO0001 Reserved for inherently not codable concepts without codable children: Secondary | ICD-10-CM | POA: Diagnosis not present

## 2013-05-26 DIAGNOSIS — Z006 Encounter for examination for normal comparison and control in clinical research program: Secondary | ICD-10-CM | POA: Diagnosis not present

## 2013-05-26 DIAGNOSIS — R21 Rash and other nonspecific skin eruption: Secondary | ICD-10-CM | POA: Diagnosis not present

## 2013-06-10 DIAGNOSIS — Z01419 Encounter for gynecological examination (general) (routine) without abnormal findings: Secondary | ICD-10-CM | POA: Diagnosis not present

## 2013-06-10 DIAGNOSIS — N899 Noninflammatory disorder of vagina, unspecified: Secondary | ICD-10-CM | POA: Diagnosis not present

## 2013-06-23 DIAGNOSIS — J3489 Other specified disorders of nose and nasal sinuses: Secondary | ICD-10-CM | POA: Diagnosis not present

## 2013-06-23 DIAGNOSIS — R059 Cough, unspecified: Secondary | ICD-10-CM | POA: Diagnosis not present

## 2013-06-23 DIAGNOSIS — I10 Essential (primary) hypertension: Secondary | ICD-10-CM | POA: Diagnosis not present

## 2013-06-23 DIAGNOSIS — IMO0001 Reserved for inherently not codable concepts without codable children: Secondary | ICD-10-CM | POA: Diagnosis not present

## 2013-06-23 DIAGNOSIS — R05 Cough: Secondary | ICD-10-CM | POA: Diagnosis not present

## 2013-07-13 DIAGNOSIS — Z1211 Encounter for screening for malignant neoplasm of colon: Secondary | ICD-10-CM | POA: Diagnosis not present

## 2013-10-11 DIAGNOSIS — Z1231 Encounter for screening mammogram for malignant neoplasm of breast: Secondary | ICD-10-CM | POA: Diagnosis not present

## 2013-10-14 DIAGNOSIS — IMO0001 Reserved for inherently not codable concepts without codable children: Secondary | ICD-10-CM | POA: Diagnosis not present

## 2013-10-14 DIAGNOSIS — Z6841 Body Mass Index (BMI) 40.0 and over, adult: Secondary | ICD-10-CM | POA: Diagnosis not present

## 2013-10-14 DIAGNOSIS — I10 Essential (primary) hypertension: Secondary | ICD-10-CM | POA: Diagnosis not present

## 2014-02-09 DIAGNOSIS — R209 Unspecified disturbances of skin sensation: Secondary | ICD-10-CM | POA: Diagnosis not present

## 2014-02-09 DIAGNOSIS — R300 Dysuria: Secondary | ICD-10-CM | POA: Diagnosis not present

## 2014-02-09 DIAGNOSIS — R3129 Other microscopic hematuria: Secondary | ICD-10-CM | POA: Diagnosis not present

## 2014-02-09 DIAGNOSIS — M79609 Pain in unspecified limb: Secondary | ICD-10-CM | POA: Diagnosis not present

## 2014-03-16 DIAGNOSIS — R252 Cramp and spasm: Secondary | ICD-10-CM | POA: Diagnosis not present

## 2014-03-16 DIAGNOSIS — G4733 Obstructive sleep apnea (adult) (pediatric): Secondary | ICD-10-CM | POA: Diagnosis not present

## 2014-03-16 DIAGNOSIS — E119 Type 2 diabetes mellitus without complications: Secondary | ICD-10-CM | POA: Diagnosis not present

## 2014-03-16 DIAGNOSIS — I10 Essential (primary) hypertension: Secondary | ICD-10-CM | POA: Diagnosis not present

## 2014-03-19 DIAGNOSIS — Z0000 Encounter for general adult medical examination without abnormal findings: Secondary | ICD-10-CM | POA: Diagnosis not present

## 2014-03-19 DIAGNOSIS — N390 Urinary tract infection, site not specified: Secondary | ICD-10-CM | POA: Diagnosis not present

## 2014-03-19 DIAGNOSIS — R209 Unspecified disturbances of skin sensation: Secondary | ICD-10-CM | POA: Diagnosis not present

## 2014-03-19 DIAGNOSIS — IMO0001 Reserved for inherently not codable concepts without codable children: Secondary | ICD-10-CM | POA: Diagnosis not present

## 2014-03-19 DIAGNOSIS — E559 Vitamin D deficiency, unspecified: Secondary | ICD-10-CM | POA: Diagnosis not present

## 2014-03-19 DIAGNOSIS — N952 Postmenopausal atrophic vaginitis: Secondary | ICD-10-CM | POA: Diagnosis not present

## 2014-03-19 DIAGNOSIS — I10 Essential (primary) hypertension: Secondary | ICD-10-CM | POA: Diagnosis not present

## 2014-03-26 ENCOUNTER — Emergency Department (INDEPENDENT_AMBULATORY_CARE_PROVIDER_SITE_OTHER)
Admission: EM | Admit: 2014-03-26 | Discharge: 2014-03-26 | Disposition: A | Discharge status: Home / Self Care | Payer: Medicare Other | Source: Home / Self Care | Attending: Family Medicine | Admitting: Family Medicine

## 2014-03-26 ENCOUNTER — Encounter (HOSPITAL_COMMUNITY): Payer: Self-pay | Admitting: Emergency Medicine

## 2014-03-26 DIAGNOSIS — M545 Low back pain, unspecified: Secondary | ICD-10-CM

## 2014-03-26 DIAGNOSIS — R319 Hematuria, unspecified: Secondary | ICD-10-CM

## 2014-03-26 LAB — POCT URINALYSIS DIP (DEVICE)
Bilirubin Urine: NEGATIVE
Glucose, UA: NEGATIVE mg/dL
Ketones, ur: NEGATIVE mg/dL
Nitrite: NEGATIVE
Protein, ur: NEGATIVE mg/dL
Specific Gravity, Urine: 1.015 (ref 1.005–1.030)
UROBILINOGEN UA: 0.2 mg/dL (ref 0.0–1.0)
pH: 5.5 (ref 5.0–8.0)

## 2014-03-26 MED ORDER — CEFDINIR 300 MG PO CAPS: 300.0000 mg | ORAL_CAPSULE | Freq: Two times a day (BID) | ORAL | Status: DC

## 2014-03-26 MED ORDER — CYCLOBENZAPRINE HCL 5 MG PO TABS: 5.0000 mg | ORAL_TABLET | Freq: Every evening | ORAL | Status: DC | PRN

## 2014-03-26 MED ORDER — TRAMADOL HCL 50 MG PO TABS: 50.0000 mg | ORAL_TABLET | Freq: Four times a day (QID) | ORAL | Status: DC | PRN

## 2014-03-26 MED ORDER — TRAMADOL HCL 50 MG PO TABS
50.0000 mg | ORAL_TABLET | Freq: Four times a day (QID) | ORAL | Status: DC | PRN
Start: 2014-03-26 — End: 2014-03-26

## 2014-03-26 NOTE — Discharge Instructions (Signed)
Thank you for coming in today. Stop taking Cipro.  Start taking Omnicef twice daily for one week Use tramadol for back pain. Do not drive after taking tramadol. Used Flexeril at bedtime for muscle spasm.  Followup with primary care provider. If your belly pain worsens, or you have high fever, bad vomiting, blood in your stool or black tarry stool go to the Emergency Room.  Come back or go to the emergency room if you notice new weakness new numbness problems walking or bowel or bladder problems.  Kidney Stones Kidney stones (urolithiasis) are deposits that form inside your kidneys. The intense pain is caused by the stone moving through the urinary tract. When the stone moves, the ureter goes into spasm around the stone. The stone is usually passed in the urine.  CAUSES   A disorder that makes certain neck glands produce too much parathyroid hormone (primary hyperparathyroidism).  A buildup of uric acid crystals, similar to gout in your joints.  Narrowing (stricture) of the ureter.  A kidney obstruction present at birth (congenital obstruction).  Previous surgery on the kidney or ureters.  Numerous kidney infections. SYMPTOMS   Feeling sick to your stomach (nauseous).  Throwing up (vomiting).  Blood in the urine (hematuria).  Pain that usually spreads (radiates) to the groin.  Frequency or urgency of urination. DIAGNOSIS   Taking a history and physical exam.  Blood or urine tests.  CT scan.  Occasionally, an examination of the inside of the urinary bladder (cystoscopy) is performed. TREATMENT   Observation.  Increasing your fluid intake.  Extracorporeal shock wave lithotripsy--This is a noninvasive procedure that uses shock waves to break up kidney stones.  Surgery may be needed if you have severe pain or persistent obstruction. There are various surgical procedures. Most of the procedures are performed with the use of small instruments. Only small incisions are  needed to accommodate these instruments, so recovery time is minimized. The size, location, and chemical composition are all important variables that will determine the proper choice of action for you. Talk to your health care provider to better understand your situation so that you will minimize the risk of injury to yourself and your kidney.  HOME CARE INSTRUCTIONS   Drink enough water and fluids to keep your urine clear or pale yellow. This will help you to pass the stone or stone fragments.  Strain all urine through the provided strainer. Keep all particulate matter and stones for your health care provider to see. The stone causing the pain may be as small as a grain of salt. It is very important to use the strainer each and every time you pass your urine. The collection of your stone will allow your health care provider to analyze it and verify that a stone has actually passed. The stone analysis will often identify what you can do to reduce the incidence of recurrences.  Only take over-the-counter or prescription medicines for pain, discomfort, or fever as directed by your health care provider.  Make a follow-up appointment with your health care provider as directed.  Get follow-up X-rays if required. The absence of pain does not always mean that the stone has passed. It may have only stopped moving. If the urine remains completely obstructed, it can cause loss of kidney function or even complete destruction of the kidney. It is your responsibility to make sure X-rays and follow-ups are completed. Ultrasounds of the kidney can show blockages and the status of the kidney. Ultrasounds are not associated  with any radiation and can be performed easily in a matter of minutes. SEEK MEDICAL CARE IF:  You experience pain that is progressive and unresponsive to any pain medicine you have been prescribed. SEEK IMMEDIATE MEDICAL CARE IF:   Pain cannot be controlled with the prescribed medicine.  You  have a fever or shaking chills.  The severity or intensity of pain increases over 18 hours and is not relieved by pain medicine.  You develop a new onset of abdominal pain.  You feel faint or pass out.  You are unable to urinate. MAKE SURE YOU:   Understand these instructions.  Will watch your condition.  Will get help right away if you are not doing well or get worse. Document Released: 06/10/2005 Document Revised: 02/10/2013 Document Reviewed: 11/11/2012 [facility] Patient Information 2015 [place], [place]. This information is not intended to replace advice given to you by your health care provider. Make sure you discuss any questions you have with your health care provider.

## 2014-03-26 NOTE — ED Notes (Signed)
Was put on Cipro 1 wk ago for UTI/hematuria.  Was feeling better, then started with mild low back pain yesterday.  Today low back pain significantly worse, and hematuria has returned.  Denies fevers.  C/O nausea.

## 2014-03-26 NOTE — ED Provider Notes (Signed)
[name] is a 68 y.o. female who presents to Urgent Care today for right sided low back pain associated with hematuria. A week ago patient developed a urinary tract infection and is currently taking Cipro. She felt better but then over the last several days has worsened. She notes right low back pain. She denies any fevers or chills nausea vomiting or diarrhea. The pain worsened today. The pain is constant and is worse with activity. No radiating pain weakness or numbness. She has not tried any medications for pain yet.   Past Medical History  Diagnosis Date  . Kidney stones   . Kidney stone   . Bladder infection   . Carpal tunnel syndrome   . Diabetes mellitus    History  Substance Use Topics  . Smoking status: Never Smoker   . Smokeless tobacco: Not on file  . Alcohol Use: No   ROS as above Medications: No current facility-administered medications for this encounter.   Current Outpatient Prescriptions  Medication Sig Dispense Refill  . aspirin EC 81 MG tablet Take 81 mg by mouth daily.      . Cholecalciferol (VITAMIN D PO) Take by mouth.      . Coenzyme Q10 (COQ10) 100 MG CAPS Take 1 capsule by mouth daily.      . hydrochlorothiazide (MICROZIDE) 12.5 MG capsule Take 12.5 mg by mouth daily as needed. For excess fluid      . losartan (COZAAR) 25 MG tablet Take 25 mg by mouth daily.      . metFORMIN (GLUCOPHAGE) 500 MG tablet Take 500 mg by mouth 2 (two) times daily with a meal.      . omega-3 acid ethyl esters (LOVAZA) 1 G capsule Take 2 g by mouth 3 (three) times a week. Monday, Wednesday, Friday      . potassium chloride SA (K-DUR,KLOR-CON) 20 MEQ tablet Take 20 mEq by mouth as needed. Take when you take your Hydrochlorothiazide      . VITAMIN E PO Take by mouth.      . cefdinir (OMNICEF) 300 MG capsule Take 1 capsule (300 mg total) by mouth 2 (two) times daily.  14 capsule  0  . cyclobenzaprine (FLEXERIL) 5 MG tablet Take 1 tablet (5 mg total) by mouth at bedtime as needed  for muscle spasms.  20 tablet  0  . traMADol (ULTRAM) 50 MG tablet Take 1 tablet (50 mg total) by mouth every 6 (six) hours as needed.  15 tablet  0    Exam:  BP 127/68  Pulse 56  Temp(Src) 97.6 F (36.4 C) (Oral)  Resp 16  SpO2 100% Gen: Well NAD HEENT: EOMI,  MMM Lungs: Normal work of breathing. CTABL Heart: RRR no MRG Abd: NABS, Soft. Nondistended, Nontender, no CV angle tenderness to percussion Exts: Brisk capillary refill, warm and well perfused.  Back: Nontender to spinal midline. Tender palpation right lumbar paraspinal. Emotionally strength is equal and normal bilaterally. Reflexes are equal bilateral knees and ankles. Antalgic gait. Diminished back range of motion due to pain.  Results for orders placed during the hospital encounter of 03/26/14 (from the past 24 hour(s))  POCT URINALYSIS DIP (DEVICE)     Status: Abnormal   Collection Time    03/26/14 12:51 PM      Result Value Ref Range   Glucose, UA NEGATIVE  NEGATIVE mg/dL   Bilirubin Urine NEGATIVE  NEGATIVE   Ketones, ur NEGATIVE  NEGATIVE mg/dL   Specific Gravity, Urine 1.015  1.005 -  1.030   Hgb urine dipstick MODERATE (*) NEGATIVE   pH 5.5  5.0 - 8.0   Protein, ur NEGATIVE  NEGATIVE mg/dL   Urobilinogen, UA 0.2  0.0 - 1.0 mg/dL   Nitrite NEGATIVE  NEGATIVE   Leukocytes, UA SMALL (*) NEGATIVE   No results found.  Assessment and Plan: 68 y.o. female with right low back pain with hematuria and leukocyte esterase. Multiple possible problems including worsening UTI, pyelonephritis, kidney stone, or simple  lumbar paraspinal myofascial disruption.  Plan to obtain a urine culture and switch from Cipro to Omnicef.  Treat with tramadol and Flexeril. Followup with PCP  Discussed warning signs or symptoms. Please see discharge instructions. Patient expresses understanding.     Rodolph BongEvan S Ashvin Adelson, MD 03/26/14 [phone]

## 2014-03-27 LAB — URINE CULTURE
Colony Count: 15000
SPECIAL REQUESTS: NORMAL

## 2014-03-29 DIAGNOSIS — R312 Other microscopic hematuria: Secondary | ICD-10-CM | POA: Diagnosis not present

## 2014-03-29 DIAGNOSIS — N390 Urinary tract infection, site not specified: Secondary | ICD-10-CM | POA: Diagnosis not present

## 2014-03-29 DIAGNOSIS — Z7982 Long term (current) use of aspirin: Secondary | ICD-10-CM | POA: Diagnosis not present

## 2014-03-29 DIAGNOSIS — R1032 Left lower quadrant pain: Secondary | ICD-10-CM | POA: Diagnosis not present

## 2014-03-29 DIAGNOSIS — R311 Benign essential microscopic hematuria: Secondary | ICD-10-CM | POA: Diagnosis not present

## 2014-04-05 ENCOUNTER — Other Ambulatory Visit: Payer: Self-pay | Admitting: Internal Medicine

## 2014-04-05 DIAGNOSIS — R1032 Left lower quadrant pain: Secondary | ICD-10-CM

## 2014-04-07 DIAGNOSIS — R350 Frequency of micturition: Secondary | ICD-10-CM | POA: Diagnosis not present

## 2014-04-07 DIAGNOSIS — R312 Other microscopic hematuria: Secondary | ICD-10-CM | POA: Diagnosis not present

## 2014-04-19 ENCOUNTER — Ambulatory Visit
Admission: RE | Admit: 2014-04-19 | Discharge: 2014-04-19 | Disposition: A | Discharge status: Home / Self Care | Payer: Medicare Other | Source: Ambulatory Visit | Attending: Internal Medicine | Admitting: Internal Medicine

## 2014-04-19 DIAGNOSIS — R1032 Left lower quadrant pain: Secondary | ICD-10-CM | POA: Diagnosis not present

## 2014-04-19 DIAGNOSIS — R319 Hematuria, unspecified: Secondary | ICD-10-CM | POA: Diagnosis not present

## 2014-04-19 DIAGNOSIS — K5730 Diverticulosis of large intestine without perforation or abscess without bleeding: Secondary | ICD-10-CM | POA: Diagnosis not present

## 2014-04-19 MED ORDER — IOHEXOL 300 MG/ML  SOLN
125.0000 mL | Freq: Once | INTRAMUSCULAR | Status: AC | PRN
  Administered 2014-04-19: 125 mL via INTRAVENOUS

## 2014-05-03 DIAGNOSIS — N390 Urinary tract infection, site not specified: Secondary | ICD-10-CM | POA: Diagnosis not present

## 2014-05-03 DIAGNOSIS — R350 Frequency of micturition: Secondary | ICD-10-CM | POA: Diagnosis not present

## 2014-05-03 DIAGNOSIS — R102 Pelvic and perineal pain: Secondary | ICD-10-CM | POA: Diagnosis not present

## 2014-05-03 DIAGNOSIS — R312 Other microscopic hematuria: Secondary | ICD-10-CM | POA: Diagnosis not present

## 2014-05-24 DIAGNOSIS — J322 Chronic ethmoidal sinusitis: Secondary | ICD-10-CM | POA: Diagnosis not present

## 2014-05-24 DIAGNOSIS — J040 Acute laryngitis: Secondary | ICD-10-CM | POA: Diagnosis not present

## 2014-05-24 DIAGNOSIS — J320 Chronic maxillary sinusitis: Secondary | ICD-10-CM | POA: Diagnosis not present

## 2014-05-24 DIAGNOSIS — J410 Simple chronic bronchitis: Secondary | ICD-10-CM | POA: Diagnosis not present

## 2014-06-02 DIAGNOSIS — J320 Chronic maxillary sinusitis: Secondary | ICD-10-CM | POA: Diagnosis not present

## 2014-06-02 DIAGNOSIS — J029 Acute pharyngitis, unspecified: Secondary | ICD-10-CM | POA: Diagnosis not present

## 2014-06-02 DIAGNOSIS — J322 Chronic ethmoidal sinusitis: Secondary | ICD-10-CM | POA: Diagnosis not present

## 2014-06-07 DIAGNOSIS — H04123 Dry eye syndrome of bilateral lacrimal glands: Secondary | ICD-10-CM | POA: Diagnosis not present

## 2014-06-07 DIAGNOSIS — H25813 Combined forms of age-related cataract, bilateral: Secondary | ICD-10-CM | POA: Diagnosis not present

## 2014-06-07 DIAGNOSIS — H0233 Blepharochalasis right eye, unspecified eyelid: Secondary | ICD-10-CM | POA: Diagnosis not present

## 2014-06-07 DIAGNOSIS — E119 Type 2 diabetes mellitus without complications: Secondary | ICD-10-CM | POA: Diagnosis not present

## 2014-07-09 DIAGNOSIS — I10 Essential (primary) hypertension: Secondary | ICD-10-CM | POA: Diagnosis not present

## 2014-07-09 DIAGNOSIS — R350 Frequency of micturition: Secondary | ICD-10-CM | POA: Diagnosis not present

## 2014-07-09 DIAGNOSIS — E1165 Type 2 diabetes mellitus with hyperglycemia: Secondary | ICD-10-CM | POA: Diagnosis not present

## 2014-10-01 DIAGNOSIS — I10 Essential (primary) hypertension: Secondary | ICD-10-CM | POA: Diagnosis not present

## 2014-10-01 DIAGNOSIS — E1165 Type 2 diabetes mellitus with hyperglycemia: Secondary | ICD-10-CM | POA: Diagnosis not present

## 2014-10-01 DIAGNOSIS — Z79899 Other long term (current) drug therapy: Secondary | ICD-10-CM | POA: Diagnosis not present

## 2014-10-01 DIAGNOSIS — R311 Benign essential microscopic hematuria: Secondary | ICD-10-CM | POA: Diagnosis not present

## 2014-10-14 DIAGNOSIS — Z803 Family history of malignant neoplasm of breast: Secondary | ICD-10-CM | POA: Diagnosis not present

## 2014-10-14 DIAGNOSIS — Z1231 Encounter for screening mammogram for malignant neoplasm of breast: Secondary | ICD-10-CM | POA: Diagnosis not present

## 2014-11-22 DIAGNOSIS — R05 Cough: Secondary | ICD-10-CM | POA: Diagnosis not present

## 2014-11-22 DIAGNOSIS — R0982 Postnasal drip: Secondary | ICD-10-CM | POA: Diagnosis not present

## 2014-12-14 ENCOUNTER — Ambulatory Visit (INDEPENDENT_AMBULATORY_CARE_PROVIDER_SITE_OTHER): Payer: Medicare Other

## 2014-12-14 ENCOUNTER — Encounter: Payer: Self-pay | Admitting: Podiatry

## 2014-12-14 ENCOUNTER — Ambulatory Visit (INDEPENDENT_AMBULATORY_CARE_PROVIDER_SITE_OTHER): Payer: Medicare Other | Admitting: Podiatry

## 2014-12-14 VITALS — BP 113/69 | HR 64 | Resp 12

## 2014-12-14 DIAGNOSIS — R52 Pain, unspecified: Secondary | ICD-10-CM

## 2014-12-14 DIAGNOSIS — M7741 Metatarsalgia, right foot: Secondary | ICD-10-CM

## 2014-12-14 DIAGNOSIS — B351 Tinea unguium: Secondary | ICD-10-CM

## 2014-12-14 DIAGNOSIS — M79675 Pain in left toe(s): Secondary | ICD-10-CM

## 2014-12-14 NOTE — Patient Instructions (Signed)
Purchase over-the-counter soft shoe insert with a metatarsal raise as we discussed Okay to add additional metatarsal raise as we discussed to the over-the-counter support Our office will contact you with the results of the fungal culture  Diabetes and Foot Care Diabetes may cause you to have problems because of poor blood supply (circulation) to your feet and legs. This may cause the skin on your feet to become thinner, break easier, and heal more slowly. Your skin may become dry, and the skin may peel and crack. You may also have nerve damage in your legs and feet causing decreased feeling in them. You may not notice minor injuries to your feet that could lead to infections or more serious problems. Taking care of your feet is one of the most important things you can do for yourself.  HOME CARE INSTRUCTIONS  Wear shoes at all times, even in the house. Do not go barefoot. Bare feet are easily injured.  Check your feet daily for blisters, cuts, and redness. If you cannot see the bottom of your feet, use a mirror or ask someone for help.  Wash your feet with warm water (do not use hot water) and mild soap. Then pat your feet and the areas between your toes until they are completely dry. Do not soak your feet as this can dry your skin.  Apply a moisturizing lotion or petroleum jelly (that does not contain alcohol and is unscented) to the skin on your feet and to dry, brittle toenails. Do not apply lotion between your toes.  Trim your toenails straight across. Do not dig under them or around the cuticle. File the edges of your nails with an emery board or nail file.  Do not cut corns or calluses or try to remove them with medicine.  Wear clean socks or stockings every day. Make sure they are not too tight. Do not wear knee-high stockings since they may decrease blood flow to your legs.  Wear shoes that fit properly and have enough cushioning. To break in new shoes, wear them for just a few hours a  day. This prevents you from injuring your feet. Always look in your shoes before you put them on to be sure there are no objects inside.  Do not cross your legs. This may decrease the blood flow to your feet.  If you find a minor scrape, cut, or break in the skin on your feet, keep it and the skin around it clean and dry. These areas may be cleansed with mild soap and water. Do not cleanse the area with peroxide, alcohol, or iodine.  When you remove an adhesive bandage, be sure not to damage the skin around it.  If you have a wound, look at it several times a day to make sure it is healing.  Do not use heating pads or hot water bottles. They may burn your skin. If you have lost feeling in your feet or legs, you may not know it is happening until it is too late.  Make sure your health care provider performs a complete foot exam at least annually or more often if you have foot problems. Report any cuts, sores, or bruises to your health care provider immediately. SEEK MEDICAL CARE IF:   You have an injury that is not healing.  You have cuts or breaks in the skin.  You have an ingrown nail.  You notice redness on your legs or feet.  You feel burning or tingling in your  legs or feet.  You have pain or cramps in your legs and feet.  Your legs or feet are numb.  Your feet always feel cold. SEEK IMMEDIATE MEDICAL CARE IF:   There is increasing redness, swelling, or pain in or around a wound.  There is a red line that goes up your leg.  Pus is coming from a wound.  You develop a fever or as directed by your health care provider.  You notice a bad smell coming from an ulcer or wound. Document Released: 06/07/2000 Document Revised: 02/10/2013 Document Reviewed: 11/17/2012 [facility] Patient Information 2015 [place], [place]. This information is not intended to replace advice given to you by your health care provider. Make sure you discuss any questions you have with your health care  provider.

## 2014-12-14 NOTE — Progress Notes (Signed)
   Subjective:    Patient ID: [name], female    DOB: 16-Mar-1946, 69 y.o.   MRN: [id number]  HPI  N-SORE, DISCOLORATION L-B/L TOENAILS D-1 YEAR O-SLOWLY C-WORSE A-PRESSURE T-RX. TRIAMINOLONE, CLOBESTASOL  Patient is also complaining of pain in the plantar second MPJ right when standing walking and would like to have this evaluated as well  She denies any history of foot ulceration or claudication  Review of Systems  Constitutional: Positive for unexpected weight change.  HENT: Positive for sinus pressure.   Eyes: Positive for redness.  Respiratory: Positive for cough and shortness of breath.   Cardiovascular: Positive for leg swelling.  Musculoskeletal: Positive for back pain and joint swelling.  Skin: Positive for color change.  Allergic/Immunologic: Positive for environmental allergies.       Objective:   Physical Exam  Orientated 3  Vascular: DP and PT pulses 2/4 bilaterally Capillary reflex immediate bilaterally No peripheral edema noted bilaterally  Neurological: Sensation to 10 g monofilament wire intact 5/5 bilaterally Vibratory sensation intact bilaterally Ankle reflex equal and reactive bilaterally  Dermatological: Dry plantar scaling skin bilaterally The toenails are elongated, brittle, discolored 6-10  Musculoskeletal: HAV deformities bilaterally Palpable tenderness plantar second MPJ to plantar pressure with mild tenderness on range of motion second MPJ right without any crepitus  X-ray weightbearing right foot  Intact bony structure without fracture and/or dislocation Posterior and inferior calcaneal spurs HAV deformity Bone and joint spaces are adequate  Radiographic impression: No acute bony abnormality noted bilaterally         Assessment & Plan:   Assessment: Satisfactory neurovascular status Mycotic toenails 10 Tinea pedis bilaterally Metatarsalgia/capsulitis second MPJ right  Plan: Reviewed results of patient's  examination x-ray today. We discussed treatment options for possible mycotic toenails and tinea pedis including no treatment, topical or oral. Patient was willing to consider oral medication if lab is positive  Nail fragments obtained by debridement of toenails 1-5 and submitted for PAS and fungal culture  Discuss plantar MPJ pain. I placed a metatarsal pad in her shoe to offload the second MPJ area. Also dispense 1 additional metatarsal pad for to placement top and over-the-counter soft arch support with a metatarsal raise. If over-the-counter support advised adequate relief and will except this is adequate treatment. If not would consider custom orthotic  Reschedule patient upon receipt of lab results for PAS and fungal culture

## 2015-01-03 ENCOUNTER — Telehealth: Payer: Self-pay | Admitting: *Deleted

## 2015-01-03 NOTE — Telephone Encounter (Signed)
Dr. Leeanne Deeduchman states pt's fungal culture results of 12/14/2014.  Left message informing pt to call for an appt to discuss results and treatment.

## 2015-01-09 DIAGNOSIS — M7062 Trochanteric bursitis, left hip: Secondary | ICD-10-CM | POA: Diagnosis not present

## 2015-01-09 DIAGNOSIS — M545 Low back pain: Secondary | ICD-10-CM | POA: Diagnosis not present

## 2015-01-09 DIAGNOSIS — I10 Essential (primary) hypertension: Secondary | ICD-10-CM | POA: Diagnosis not present

## 2015-01-09 DIAGNOSIS — E1165 Type 2 diabetes mellitus with hyperglycemia: Secondary | ICD-10-CM | POA: Diagnosis not present

## 2015-01-10 ENCOUNTER — Encounter: Payer: Self-pay | Admitting: Podiatry

## 2015-01-19 DIAGNOSIS — H0231 Blepharochalasis right upper eyelid: Secondary | ICD-10-CM | POA: Diagnosis not present

## 2015-01-19 DIAGNOSIS — H25813 Combined forms of age-related cataract, bilateral: Secondary | ICD-10-CM | POA: Diagnosis not present

## 2015-01-19 DIAGNOSIS — H04123 Dry eye syndrome of bilateral lacrimal glands: Secondary | ICD-10-CM | POA: Diagnosis not present

## 2015-01-20 DIAGNOSIS — H0236 Blepharochalasis left eye, unspecified eyelid: Secondary | ICD-10-CM | POA: Diagnosis not present

## 2015-01-31 ENCOUNTER — Ambulatory Visit (INDEPENDENT_AMBULATORY_CARE_PROVIDER_SITE_OTHER): Payer: Medicare Other | Admitting: Podiatry

## 2015-01-31 ENCOUNTER — Encounter: Payer: Self-pay | Admitting: Podiatry

## 2015-01-31 VITALS — BP 137/80 | HR 64 | Resp 12

## 2015-01-31 DIAGNOSIS — B351 Tinea unguium: Secondary | ICD-10-CM | POA: Diagnosis not present

## 2015-01-31 DIAGNOSIS — M7741 Metatarsalgia, right foot: Secondary | ICD-10-CM | POA: Diagnosis not present

## 2015-01-31 LAB — HEPATIC FUNCTION PANEL
ALBUMIN: 3.5 g/dL — AB (ref 3.6–5.1)
ALT: 10 U/L (ref 6–29)
AST: 12 U/L (ref 10–35)
Alkaline Phosphatase: 89 U/L (ref 33–130)
BILIRUBIN INDIRECT: 0.2 mg/dL (ref 0.2–1.2)
Bilirubin, Direct: 0.1 mg/dL (ref <=0.2)
Total Bilirubin: 0.3 mg/dL (ref 0.2–1.2)
Total Protein: 6.1 g/dL (ref 6.1–8.1)

## 2015-01-31 MED ORDER — TERBINAFINE HCL 250 MG PO TABS: 250.0000 mg | ORAL_TABLET | Freq: Every day | ORAL | Status: DC

## 2015-01-31 NOTE — Progress Notes (Signed)
   Subjective:    Patient ID: Sherren Kerns, female    DOB: 1946/03/14, 69 y.o.   MRN: [id number]  HPI  PAS LAB RESULT.   This patient presents today for follow-up visit to discuss AB results positive for dermatophytes as well as in particular T Rubrum. She has some symptoms in the area was initially evaluated on the visit of 12/14/2014 for symptomatic mycotic toenails as well as tarsalgia/capsulitis on the right foot. Today we are going to review the results of the lab in offer her treatment recommendations  Review of Systems  Skin: Positive for color change.       Objective:   Physical Exam   orientated 3  Vascular: Deferred  Dermatological: Deferred  Musculoskeletal: Deferred  Results of [facility] received date 12/21/2014 PAS stain revealed fungal elements consistent with dermatophytes Fungal culture positive for T. Rubrum       Assessment & Plan:   Assessment: Onychomycoses confirm with lab  Plan: We discussed treatment options in detail today including no treatment, topical treatment, and oral medication. I made patient aware of the most reliable treatment was the oral medication patient was willing to use oral medication and understands there are possible complications and side effects from any medication  Issued baseline request for hepatic function. Patient will have lab and will contact patient if lab within normal limits and will begin terbinafine 250 mg #30 one daily 2 refills  Reappoint 1 month

## 2015-01-31 NOTE — Patient Instructions (Signed)
Have your lab work done at your convenience. You do not have to be fasting We will contact you with the lab results and if they are okay Begin Terbinafine 250 mg #30, 2 refills take one daily We'll reevaluate her tolerance of the medication 30 days

## 2015-02-01 ENCOUNTER — Telehealth: Payer: Self-pay | Admitting: *Deleted

## 2015-02-01 NOTE — Telephone Encounter (Signed)
Dr. Leeanne Deed states labs of 01/31/2015 are normal and pt may begin the Lamisil.  Called [phone] to inform pt of Dr. Theotis Burrow orders.  Pt states understanding.

## 2015-02-21 DIAGNOSIS — R1032 Left lower quadrant pain: Secondary | ICD-10-CM | POA: Diagnosis not present

## 2015-02-21 DIAGNOSIS — K219 Gastro-esophageal reflux disease without esophagitis: Secondary | ICD-10-CM | POA: Diagnosis not present

## 2015-02-28 ENCOUNTER — Encounter: Payer: Self-pay | Admitting: Podiatry

## 2015-02-28 ENCOUNTER — Ambulatory Visit (INDEPENDENT_AMBULATORY_CARE_PROVIDER_SITE_OTHER): Payer: Medicare Other | Admitting: Podiatry

## 2015-02-28 VITALS — BP 148/72 | HR 60 | Resp 12

## 2015-02-28 DIAGNOSIS — Z79899 Other long term (current) drug therapy: Secondary | ICD-10-CM | POA: Diagnosis not present

## 2015-02-28 NOTE — Progress Notes (Signed)
   Subjective:    Patient ID: [name], female    DOB: [birth year], 69 y.o.   MRN: [id number]  HPI This patient presents for follow-up care for tolerance of terbinafine 250 mg for treatment of lab confirm mycotic nails. Patient began terbinafine after the visit of 01/31/2015 and developed extreme nausea after each dose and had to discontinue terbinafine. Also, patient was diagnosed with diverticulitis and is undergoing active treatment for diverticulitis   Review of Systems  All other systems reviewed and are negative.      Objective:   Physical Exam  Orientated 3  Vascular: DP and PT pulses 2/4 bilaterally Capillary reflex immediate bilaterally  Neurological: Deferred  Dermatological: Dry plantar skin bilaterally Multiple discolored toenails 6-10  Musculoskeletal: HAV deformities bilaterally      Assessment & Plan:   Assessment: Extreme nausea most likely induced by intolerance to terbinafine, however, cannot rule out the underlying diverticulitis Mycotic toenails and tinea pedis  Plan: At this time I advised patient to DC terbinafine after your treating doctor for diabetic radiculitis as resolve your symptoms associated with the diverticulitis I advised her to ask the GI doctor if there is any contraindication to resume terbinafine 250 mg. I made aware that if she resume terbinafine and tolerating the medication without nausea she would need a total of 90 doses for complete therapy as well as an intermediate liver profile if she continued on  At this time patient will resolve symptoms of diverticulitis and will notify us if she attempts to begin terbinafine with with toleration of the medication

## 2015-02-28 NOTE — Patient Instructions (Signed)
At this time because of your extreme nausea from terbinafine 250 mg discontinue the use of this medicine at this time After your diverticulitis improves ask your GI Dr. if there is any contraindication of you starting the terbinafine 250 mg A total of 90 doses are needed for a standard treatment for fungal toenails

## 2015-03-03 ENCOUNTER — Telehealth: Payer: Self-pay | Admitting: *Deleted

## 2015-03-03 NOTE — Telephone Encounter (Signed)
Completed Physician New Rx Fax Form for Terbinafine  #30 take one tablet daily, no refills faxed.

## 2015-03-09 DIAGNOSIS — M170 Bilateral primary osteoarthritis of knee: Secondary | ICD-10-CM | POA: Diagnosis not present

## 2015-03-30 DIAGNOSIS — I10 Essential (primary) hypertension: Secondary | ICD-10-CM | POA: Diagnosis not present

## 2015-03-30 DIAGNOSIS — E559 Vitamin D deficiency, unspecified: Secondary | ICD-10-CM | POA: Diagnosis not present

## 2015-03-30 DIAGNOSIS — M170 Bilateral primary osteoarthritis of knee: Secondary | ICD-10-CM | POA: Diagnosis not present

## 2015-03-30 DIAGNOSIS — Z0000 Encounter for general adult medical examination without abnormal findings: Secondary | ICD-10-CM | POA: Diagnosis not present

## 2015-03-30 DIAGNOSIS — M25561 Pain in right knee: Secondary | ICD-10-CM | POA: Diagnosis not present

## 2015-03-30 DIAGNOSIS — E1165 Type 2 diabetes mellitus with hyperglycemia: Secondary | ICD-10-CM | POA: Diagnosis not present

## 2015-04-19 DIAGNOSIS — K5792 Diverticulitis of intestine, part unspecified, without perforation or abscess without bleeding: Secondary | ICD-10-CM | POA: Diagnosis not present

## 2015-04-19 DIAGNOSIS — Z1211 Encounter for screening for malignant neoplasm of colon: Secondary | ICD-10-CM | POA: Diagnosis not present

## 2015-05-15 DIAGNOSIS — K640 First degree hemorrhoids: Secondary | ICD-10-CM | POA: Diagnosis not present

## 2015-05-15 DIAGNOSIS — K5730 Diverticulosis of large intestine without perforation or abscess without bleeding: Secondary | ICD-10-CM | POA: Diagnosis not present

## 2015-05-15 DIAGNOSIS — Z1211 Encounter for screening for malignant neoplasm of colon: Secondary | ICD-10-CM | POA: Diagnosis not present

## 2015-05-31 ENCOUNTER — Ambulatory Visit (INDEPENDENT_AMBULATORY_CARE_PROVIDER_SITE_OTHER): Payer: Medicare Other | Admitting: Podiatry

## 2015-05-31 DIAGNOSIS — B351 Tinea unguium: Secondary | ICD-10-CM

## 2015-05-31 NOTE — Progress Notes (Signed)
Patient ID: [name], female   DOB: May 23, 1946, 69 y.o.   MRN: [id number]   Subjective: This patient presents today stating that she has completed 90 doses of terbinafine 250 mg without a complaint from the medication. She was last evaluated on 02/28/2015 and at that time patient was describing diverticulitis and extreme nausea and was advised to DC the terbinafine and consult with GI doctor as to when she could resume the terbinafine. Patient did not present for follow-up until today stating that she would like to have her nails evaluated after completion of the terbinafine without mention of consult with her GI doctor Also patient was complaining of some diffuse discomfort plantar MPJ right  Objective: The toenails 1-5 demonstrates some proximal clearing in the nail place with the remaining nail plates demonstrating texture color changes and are hypertrophic Atrophic fad pad MPJ right  Assessment: Completion of 90 days of terbinafine 250 mg with last dose approximately mid November 2016 Atrophic fad pad MPJ right  Plan: Advised patient that no further terbinafine was needed. I advised her as nails gradually regrow over the next 4-6 months to replace the nail plate should 10 to improve in appearance. Dispensed a felt metatarsal pad for patient to place in her shoe right to treat atrophic fat-pad right and metatarsalgia right  Reappoint at patient's request

## 2015-05-31 NOTE — Patient Instructions (Signed)
You have completed 90 doses of terbinafine 250 mg. No further medication is needed at this time Reappoint at your request

## 2015-06-08 DIAGNOSIS — M170 Bilateral primary osteoarthritis of knee: Secondary | ICD-10-CM | POA: Diagnosis not present

## 2015-06-22 DIAGNOSIS — H0233 Blepharochalasis right eye, unspecified eyelid: Secondary | ICD-10-CM | POA: Diagnosis not present

## 2015-06-22 DIAGNOSIS — H04123 Dry eye syndrome of bilateral lacrimal glands: Secondary | ICD-10-CM | POA: Diagnosis not present

## 2015-06-22 DIAGNOSIS — H35373 Puckering of macula, bilateral: Secondary | ICD-10-CM | POA: Diagnosis not present

## 2015-06-22 DIAGNOSIS — H35363 Drusen (degenerative) of macula, bilateral: Secondary | ICD-10-CM | POA: Diagnosis not present

## 2015-06-22 DIAGNOSIS — E119 Type 2 diabetes mellitus without complications: Secondary | ICD-10-CM | POA: Diagnosis not present

## 2015-06-22 DIAGNOSIS — H25813 Combined forms of age-related cataract, bilateral: Secondary | ICD-10-CM | POA: Diagnosis not present

## 2015-06-23 ENCOUNTER — Other Ambulatory Visit: Payer: Self-pay | Admitting: Obstetrics and Gynecology

## 2015-06-23 DIAGNOSIS — R8271 Bacteriuria: Secondary | ICD-10-CM | POA: Diagnosis not present

## 2015-06-23 DIAGNOSIS — N760 Acute vaginitis: Secondary | ICD-10-CM | POA: Diagnosis not present

## 2015-07-07 DIAGNOSIS — M179 Osteoarthritis of knee, unspecified: Secondary | ICD-10-CM | POA: Diagnosis not present

## 2015-07-07 DIAGNOSIS — E1165 Type 2 diabetes mellitus with hyperglycemia: Secondary | ICD-10-CM | POA: Diagnosis not present

## 2015-07-07 DIAGNOSIS — I10 Essential (primary) hypertension: Secondary | ICD-10-CM | POA: Diagnosis not present

## 2015-07-07 DIAGNOSIS — N760 Acute vaginitis: Secondary | ICD-10-CM | POA: Diagnosis not present

## 2015-07-19 DIAGNOSIS — B370 Candidal stomatitis: Secondary | ICD-10-CM | POA: Diagnosis not present

## 2015-07-19 DIAGNOSIS — R05 Cough: Secondary | ICD-10-CM | POA: Diagnosis not present

## 2015-07-19 DIAGNOSIS — J320 Chronic maxillary sinusitis: Secondary | ICD-10-CM | POA: Diagnosis not present

## 2015-07-19 DIAGNOSIS — J040 Acute laryngitis: Secondary | ICD-10-CM | POA: Diagnosis not present

## 2015-07-19 DIAGNOSIS — J322 Chronic ethmoidal sinusitis: Secondary | ICD-10-CM | POA: Diagnosis not present

## 2015-07-27 DIAGNOSIS — J322 Chronic ethmoidal sinusitis: Secondary | ICD-10-CM | POA: Diagnosis not present

## 2015-07-27 DIAGNOSIS — J040 Acute laryngitis: Secondary | ICD-10-CM | POA: Diagnosis not present

## 2015-07-27 DIAGNOSIS — J320 Chronic maxillary sinusitis: Secondary | ICD-10-CM | POA: Diagnosis not present

## 2015-07-27 DIAGNOSIS — J410 Simple chronic bronchitis: Secondary | ICD-10-CM | POA: Diagnosis not present

## 2015-09-27 DIAGNOSIS — L84 Corns and callosities: Secondary | ICD-10-CM | POA: Diagnosis not present

## 2015-09-27 DIAGNOSIS — E1165 Type 2 diabetes mellitus with hyperglycemia: Secondary | ICD-10-CM | POA: Diagnosis not present

## 2015-09-27 DIAGNOSIS — L304 Erythema intertrigo: Secondary | ICD-10-CM | POA: Diagnosis not present

## 2015-09-27 DIAGNOSIS — I10 Essential (primary) hypertension: Secondary | ICD-10-CM | POA: Diagnosis not present

## 2015-09-27 DIAGNOSIS — N951 Menopausal and female climacteric states: Secondary | ICD-10-CM | POA: Diagnosis not present

## 2015-11-07 DIAGNOSIS — Z803 Family history of malignant neoplasm of breast: Secondary | ICD-10-CM | POA: Diagnosis not present

## 2015-11-07 DIAGNOSIS — Z1231 Encounter for screening mammogram for malignant neoplasm of breast: Secondary | ICD-10-CM | POA: Diagnosis not present

## 2015-11-21 DIAGNOSIS — I83893 Varicose veins of bilateral lower extremities with other complications: Secondary | ICD-10-CM | POA: Diagnosis not present

## 2015-11-21 DIAGNOSIS — I83813 Varicose veins of bilateral lower extremities with pain: Secondary | ICD-10-CM | POA: Diagnosis not present

## 2015-12-12 DIAGNOSIS — I83893 Varicose veins of bilateral lower extremities with other complications: Secondary | ICD-10-CM | POA: Diagnosis not present

## 2015-12-12 DIAGNOSIS — I83813 Varicose veins of bilateral lower extremities with pain: Secondary | ICD-10-CM | POA: Diagnosis not present

## 2015-12-27 DIAGNOSIS — J309 Allergic rhinitis, unspecified: Secondary | ICD-10-CM | POA: Diagnosis not present

## 2015-12-27 DIAGNOSIS — Z6841 Body Mass Index (BMI) 40.0 and over, adult: Secondary | ICD-10-CM | POA: Diagnosis not present

## 2015-12-27 DIAGNOSIS — I10 Essential (primary) hypertension: Secondary | ICD-10-CM | POA: Diagnosis not present

## 2015-12-27 DIAGNOSIS — E1165 Type 2 diabetes mellitus with hyperglycemia: Secondary | ICD-10-CM | POA: Diagnosis not present

## 2016-01-15 DIAGNOSIS — R300 Dysuria: Secondary | ICD-10-CM | POA: Diagnosis not present

## 2016-01-15 DIAGNOSIS — N898 Other specified noninflammatory disorders of vagina: Secondary | ICD-10-CM | POA: Diagnosis not present

## 2016-02-03 ENCOUNTER — Emergency Department (HOSPITAL_COMMUNITY)
Admission: EM | Admit: 2016-02-03 | Discharge: 2016-02-03 | Disposition: A | Discharge status: Home / Self Care | Payer: Medicare Other | Attending: Emergency Medicine | Admitting: Emergency Medicine

## 2016-02-03 ENCOUNTER — Encounter (HOSPITAL_COMMUNITY): Payer: Self-pay

## 2016-02-03 DIAGNOSIS — B379 Candidiasis, unspecified: Secondary | ICD-10-CM | POA: Diagnosis not present

## 2016-02-03 DIAGNOSIS — Z7982 Long term (current) use of aspirin: Secondary | ICD-10-CM | POA: Diagnosis not present

## 2016-02-03 DIAGNOSIS — B372 Candidiasis of skin and nail: Secondary | ICD-10-CM | POA: Insufficient documentation

## 2016-02-03 DIAGNOSIS — Z7984 Long term (current) use of oral hypoglycemic drugs: Secondary | ICD-10-CM | POA: Diagnosis not present

## 2016-02-03 DIAGNOSIS — Z79899 Other long term (current) drug therapy: Secondary | ICD-10-CM | POA: Diagnosis not present

## 2016-02-03 DIAGNOSIS — E119 Type 2 diabetes mellitus without complications: Secondary | ICD-10-CM | POA: Diagnosis not present

## 2016-02-03 DIAGNOSIS — H9201 Otalgia, right ear: Secondary | ICD-10-CM | POA: Diagnosis present

## 2016-02-03 MED ORDER — TRIAMCINOLONE ACETONIDE 0.5 % EX CREA
TOPICAL_CREAM | Freq: Once | CUTANEOUS | Status: DC
  Filled 2016-02-03: qty 15

## 2016-02-03 MED ORDER — CLOTRIMAZOLE-BETAMETHASONE 1-0.05 % EX CREA
TOPICAL_CREAM | Freq: Two times a day (BID) | CUTANEOUS | Status: DC
  Administered 2016-02-03: 22:00:00 via TOPICAL
  Filled 2016-02-03: qty 15

## 2016-02-03 NOTE — ED Triage Notes (Signed)
Pt presents from home c/o R sided otalgia. Pt states that she was placed on some antibiotics for a bladder infection and since then has had some scalp itching and bilateral ear pain. Pt was given some ear drops by her PCP. States that currently the itching has subsided, but she still has sever R ear pain. A&Ox4.

## 2016-02-03 NOTE — ED Provider Notes (Signed)
[facility]-EMERGENCY DEPT Provider Note   CSN: [id number] Arrival date & time: 02/03/16  2020  First Provider Contact:  First MD Initiated Contact with Patient 02/03/16 2052    By signing my name below, I, Linna Darner, attest that this documentation has been prepared under the direction and in the presence of Elpidio Anis, PA-C. Electronically Signed: Linna Darner, Scribe. 02/03/2016. 8:52 PM.   History   Chief Complaint Chief Complaint  Patient presents with  . Otalgia     The history is provided by the patient. No language interpreter was used.     HPI Comments: [name] is a 70 y.o. female who presents to the Emergency Department complaining of sudden onset, constant, right ear pain for the last three days. She notes associated external right ear swelling as well as itching. Pt states her symptoms are predominately external, but feels itching down into her right ear where she cannot reach as well. She states she called her ENT, Dr. Haroldine Laws, who prescribed cortisporin drops; pt notes no relief with this medication. She denies hearing changes, fever, dizziness, or any other associated symptoms.  Past Medical History:  Diagnosis Date  . Bladder infection   . Carpal tunnel syndrome   . Diabetes mellitus   . Kidney stone   . Kidney stones     Patient Active Problem List   Diagnosis Date Noted  . UPPER RESPIRATORY INFECTION, VIRAL 03/22/2009  . ALLERGIC RHINITIS CAUSE UNSPECIFIED 11/04/2008  . HYPERLIPIDEMIA 04/11/2008  . COUGH 04/11/2008  . OBSTRUCTIVE SLEEP APNEA 03/21/2008  . CARPAL TUNNEL RELEASE, BILATERAL, HX OF 03/21/2008    Past Surgical History:  Procedure Laterality Date  . ABDOMINAL HYSTERECTOMY     partial  . APPENDECTOMY    . HERNIA REPAIR    . TONSILLECTOMY      OB History    No data available       Home Medications    Prior to Admission medications   Medication Sig Start Date End Date Taking? Authorizing Provider  aspirin EC 81 MG  tablet Take 81 mg by mouth daily.    Historical Provider, MD  cefdinir (OMNICEF) 300 MG capsule Take 1 capsule (300 mg total) by mouth 2 (two) times daily. 03/26/14   Rodolph Bong, MD  Cholecalciferol (VITAMIN D PO) Take by mouth.    Historical Provider, MD  Coenzyme Q10 (COQ10) 100 MG CAPS Take 1 capsule by mouth daily.    Historical Provider, MD  cyclobenzaprine (FLEXERIL) 5 MG tablet Take 1 tablet (5 mg total) by mouth at bedtime as needed for muscle spasms. 03/26/14   Rodolph Bong, MD  hydrochlorothiazide (MICROZIDE) 12.5 MG capsule Take 12.5 mg by mouth daily as needed. For excess fluid    Historical Provider, MD  losartan (COZAAR) 25 MG tablet Take 25 mg by mouth daily.    Historical Provider, MD  metFORMIN (GLUCOPHAGE) 500 MG tablet Take 500 mg by mouth 2 (two) times daily with a meal.    Historical Provider, MD  omega-3 acid ethyl esters (LOVAZA) 1 G capsule Take 2 g by mouth 3 (three) times a week. Monday, Wednesday, Friday    Historical Provider, MD  potassium chloride SA (K-DUR,KLOR-CON) 20 MEQ tablet Take 20 mEq by mouth as needed. Take when you take your Hydrochlorothiazide    Historical Provider, MD  terbinafine (LAMISIL) 250 MG tablet Take 1 tablet (250 mg total) by mouth daily. 01/31/15   Carrington Clamp, DPM  traMADol (ULTRAM) 50 MG tablet Take  1 tablet (50 mg total) by mouth every 6 (six) hours as needed. 03/26/14   Rodolph BongEvan S Corey, MD  VITAMIN E PO Take by mouth.    Historical Provider, MD    Family History History reviewed. No pertinent family history.  Social History Social History  Substance Use Topics  . Smoking status: Never Smoker  . Smokeless tobacco: Never Used  . Alcohol use No     Allergies   Sulfonamide derivatives and Tetanus toxoids   Review of Systems Review of Systems  Constitutional: Negative for fever.  HENT: Positive for ear discharge and ear pain. Negative for sore throat.   Gastrointestinal: Negative for nausea.  Musculoskeletal: Negative for  myalgias and neck pain.  Skin: Positive for color change and rash (to external right ear.).  Neurological: Negative for dizziness.    Physical Exam Updated Vital Signs BP 143/70 (BP Location: Right Arm)   Pulse 78   Temp 98.3 F (36.8 C) (Oral)   Resp 20   Ht 5\' 4"  (1.626 m)   Wt 226 lb (102.5 kg)   SpO2 98%   BMI 38.79 kg/m   Physical Exam  Constitutional: She is oriented to person, place, and time. She appears well-developed and well-nourished. No distress.  HENT:  Head: Normocephalic and atraumatic.  Raised rash to the external right ear including lobe, tragus, and preauricular area. Moderate redness. No pustules or blisters. Significantly pruritic. External canal appears swollen with white discharge present c/w otitis externa.   Eyes: Conjunctivae and EOM are normal.  Neck: Neck supple. No tracheal deviation present.  Cardiovascular: Normal rate.   Pulmonary/Chest: Effort normal. No respiratory distress.  Musculoskeletal: Normal range of motion.  Neurological: She is alert and oriented to person, place, and time.  Skin: Skin is warm and dry.  Psychiatric: She has a normal mood and affect. Her behavior is normal.  Nursing note and vitals reviewed.   ED Treatments / Results  Labs (all labs ordered are listed, but only abnormal results are displayed) Labs Reviewed - No data to display  EKG  EKG Interpretation None       Radiology No results found.  Procedures Procedures (including critical care time)  DIAGNOSTIC STUDIES: Oxygen Saturation is 98% on RA, normal by my interpretation.    COORDINATION OF CARE: 8:52 PM Discussed treatment plan with pt at bedside and pt agreed to plan.  Medications Ordered in ED Medications - No data to display   Initial Impression / Assessment and Plan / ED Course  I have reviewed the triage vital signs and the nursing notes.  Pertinent labs & imaging results that were available during my care of the patient were reviewed  by me and considered in my medical decision making (see chart for details).  Clinical Course    1. Otitis externa 2. Candidal skin infection  An ear wick was placed into the right external ear canal to facilitate medication for externa. She is to continue using drops prescribed by Dr. Joneen Roachrosley.   Topical lotrisone prescribed for external skin infection that appears fungal. Dr. Clarene DukeLittle has seen and evaluated the patient and agrees with plan.   I personally performed the services described in this documentation, which was scribed in my presence. The recorded information has been reviewed and is accurate.    Final Clinical Impressions(s) / ED Diagnoses   Final diagnoses:  None    New Prescriptions New Prescriptions   No medications on file     Elpidio AnisShari Naftali Carchi, PA-C 02/19/16 0507  Laurence Spates, MD 02/19/16 [phone]

## 2016-02-14 DIAGNOSIS — N390 Urinary tract infection, site not specified: Secondary | ICD-10-CM | POA: Diagnosis not present

## 2016-02-14 DIAGNOSIS — H6061 Unspecified chronic otitis externa, right ear: Secondary | ICD-10-CM | POA: Diagnosis not present

## 2016-02-14 DIAGNOSIS — H6501 Acute serous otitis media, right ear: Secondary | ICD-10-CM | POA: Diagnosis not present

## 2016-02-14 DIAGNOSIS — B379 Candidiasis, unspecified: Secondary | ICD-10-CM | POA: Diagnosis not present

## 2016-02-14 DIAGNOSIS — Z09 Encounter for follow-up examination after completed treatment for conditions other than malignant neoplasm: Secondary | ICD-10-CM | POA: Diagnosis not present

## 2016-02-14 DIAGNOSIS — L299 Pruritus, unspecified: Secondary | ICD-10-CM | POA: Diagnosis not present

## 2016-03-12 DIAGNOSIS — H6061 Unspecified chronic otitis externa, right ear: Secondary | ICD-10-CM | POA: Diagnosis not present

## 2016-04-01 DIAGNOSIS — I83813 Varicose veins of bilateral lower extremities with pain: Secondary | ICD-10-CM | POA: Diagnosis not present

## 2016-04-04 DIAGNOSIS — I83892 Varicose veins of left lower extremities with other complications: Secondary | ICD-10-CM | POA: Diagnosis not present

## 2016-04-15 DIAGNOSIS — R300 Dysuria: Secondary | ICD-10-CM | POA: Diagnosis not present

## 2016-04-15 DIAGNOSIS — R319 Hematuria, unspecified: Secondary | ICD-10-CM | POA: Diagnosis not present

## 2016-04-15 DIAGNOSIS — R10814 Left lower quadrant abdominal tenderness: Secondary | ICD-10-CM | POA: Diagnosis not present

## 2016-05-01 DIAGNOSIS — R300 Dysuria: Secondary | ICD-10-CM | POA: Diagnosis not present

## 2016-05-01 DIAGNOSIS — N760 Acute vaginitis: Secondary | ICD-10-CM | POA: Diagnosis not present

## 2016-05-07 DIAGNOSIS — Z0000 Encounter for general adult medical examination without abnormal findings: Secondary | ICD-10-CM | POA: Diagnosis not present

## 2016-05-07 DIAGNOSIS — Z1389 Encounter for screening for other disorder: Secondary | ICD-10-CM | POA: Diagnosis not present

## 2016-05-07 DIAGNOSIS — E039 Hypothyroidism, unspecified: Secondary | ICD-10-CM | POA: Diagnosis not present

## 2016-05-07 DIAGNOSIS — I10 Essential (primary) hypertension: Secondary | ICD-10-CM | POA: Diagnosis not present

## 2016-05-07 DIAGNOSIS — E559 Vitamin D deficiency, unspecified: Secondary | ICD-10-CM | POA: Diagnosis not present

## 2016-05-07 DIAGNOSIS — E1165 Type 2 diabetes mellitus with hyperglycemia: Secondary | ICD-10-CM | POA: Diagnosis not present

## 2016-05-09 DIAGNOSIS — R102 Pelvic and perineal pain: Secondary | ICD-10-CM | POA: Diagnosis not present

## 2016-05-09 DIAGNOSIS — R319 Hematuria, unspecified: Secondary | ICD-10-CM | POA: Diagnosis not present

## 2016-05-09 DIAGNOSIS — N760 Acute vaginitis: Secondary | ICD-10-CM | POA: Diagnosis not present

## 2016-05-21 DIAGNOSIS — H25813 Combined forms of age-related cataract, bilateral: Secondary | ICD-10-CM | POA: Diagnosis not present

## 2016-05-21 DIAGNOSIS — H04123 Dry eye syndrome of bilateral lacrimal glands: Secondary | ICD-10-CM | POA: Diagnosis not present

## 2016-05-21 DIAGNOSIS — H353131 Nonexudative age-related macular degeneration, bilateral, early dry stage: Secondary | ICD-10-CM | POA: Diagnosis not present

## 2016-05-21 DIAGNOSIS — E119 Type 2 diabetes mellitus without complications: Secondary | ICD-10-CM | POA: Diagnosis not present

## 2016-05-28 ENCOUNTER — Emergency Department (HOSPITAL_COMMUNITY): Payer: Medicare Other

## 2016-05-28 ENCOUNTER — Observation Stay (HOSPITAL_COMMUNITY)
Admission: EM | Admit: 2016-05-28 | Discharge: 2016-05-29 | Disposition: A | Discharge status: Home / Self Care | Payer: Medicare Other | Attending: Internal Medicine | Admitting: Internal Medicine

## 2016-05-28 ENCOUNTER — Encounter (HOSPITAL_COMMUNITY): Payer: Self-pay | Admitting: Emergency Medicine

## 2016-05-28 DIAGNOSIS — Z79899 Other long term (current) drug therapy: Secondary | ICD-10-CM | POA: Diagnosis not present

## 2016-05-28 DIAGNOSIS — E785 Hyperlipidemia, unspecified: Secondary | ICD-10-CM | POA: Diagnosis present

## 2016-05-28 DIAGNOSIS — M79602 Pain in left arm: Secondary | ICD-10-CM | POA: Diagnosis not present

## 2016-05-28 DIAGNOSIS — Z7984 Long term (current) use of oral hypoglycemic drugs: Secondary | ICD-10-CM | POA: Insufficient documentation

## 2016-05-28 DIAGNOSIS — G4733 Obstructive sleep apnea (adult) (pediatric): Secondary | ICD-10-CM | POA: Insufficient documentation

## 2016-05-28 DIAGNOSIS — Z87442 Personal history of urinary calculi: Secondary | ICD-10-CM | POA: Insufficient documentation

## 2016-05-28 DIAGNOSIS — E1169 Type 2 diabetes mellitus with other specified complication: Secondary | ICD-10-CM

## 2016-05-28 DIAGNOSIS — Z6838 Body mass index (BMI) 38.0-38.9, adult: Secondary | ICD-10-CM | POA: Diagnosis not present

## 2016-05-28 DIAGNOSIS — I10 Essential (primary) hypertension: Secondary | ICD-10-CM | POA: Diagnosis not present

## 2016-05-28 DIAGNOSIS — Z90710 Acquired absence of both cervix and uterus: Secondary | ICD-10-CM | POA: Insufficient documentation

## 2016-05-28 DIAGNOSIS — Z887 Allergy status to serum and vaccine status: Secondary | ICD-10-CM | POA: Diagnosis not present

## 2016-05-28 DIAGNOSIS — E119 Type 2 diabetes mellitus without complications: Secondary | ICD-10-CM | POA: Diagnosis not present

## 2016-05-28 DIAGNOSIS — R531 Weakness: Secondary | ICD-10-CM | POA: Diagnosis not present

## 2016-05-28 DIAGNOSIS — Z882 Allergy status to sulfonamides status: Secondary | ICD-10-CM | POA: Insufficient documentation

## 2016-05-28 DIAGNOSIS — R0602 Shortness of breath: Secondary | ICD-10-CM | POA: Diagnosis not present

## 2016-05-28 DIAGNOSIS — E669 Obesity, unspecified: Secondary | ICD-10-CM | POA: Diagnosis not present

## 2016-05-28 DIAGNOSIS — J309 Allergic rhinitis, unspecified: Secondary | ICD-10-CM | POA: Diagnosis not present

## 2016-05-28 DIAGNOSIS — R0789 Other chest pain: Principal | ICD-10-CM | POA: Insufficient documentation

## 2016-05-28 DIAGNOSIS — Z7982 Long term (current) use of aspirin: Secondary | ICD-10-CM | POA: Insufficient documentation

## 2016-05-28 DIAGNOSIS — R079 Chest pain, unspecified: Secondary | ICD-10-CM | POA: Diagnosis present

## 2016-05-28 DIAGNOSIS — R0600 Dyspnea, unspecified: Secondary | ICD-10-CM | POA: Diagnosis not present

## 2016-05-28 DIAGNOSIS — Z8249 Family history of ischemic heart disease and other diseases of the circulatory system: Secondary | ICD-10-CM | POA: Insufficient documentation

## 2016-05-28 DIAGNOSIS — Z833 Family history of diabetes mellitus: Secondary | ICD-10-CM | POA: Diagnosis not present

## 2016-05-28 DIAGNOSIS — Z823 Family history of stroke: Secondary | ICD-10-CM | POA: Insufficient documentation

## 2016-05-28 DIAGNOSIS — E7800 Pure hypercholesterolemia, unspecified: Secondary | ICD-10-CM | POA: Insufficient documentation

## 2016-05-28 LAB — BASIC METABOLIC PANEL
Anion gap: 8 (ref 5–15)
BUN: 12 mg/dL (ref 6–20)
CALCIUM: 9.3 mg/dL (ref 8.9–10.3)
CO2: 24 mmol/L (ref 22–32)
Chloride: 108 mmol/L (ref 101–111)
Creatinine, Ser: 0.7 mg/dL (ref 0.44–1.00)
GLUCOSE: 163 mg/dL — AB (ref 65–99)
Potassium: 3.6 mmol/L (ref 3.5–5.1)
Sodium: 140 mmol/L (ref 135–145)

## 2016-05-28 LAB — CBC
HCT: 36.7 % (ref 36.0–46.0)
HEMOGLOBIN: 11.9 g/dL — AB (ref 12.0–15.0)
MCH: 28.6 pg (ref 26.0–34.0)
MCHC: 32.4 g/dL (ref 30.0–36.0)
MCV: 88.2 fL (ref 78.0–100.0)
Platelets: 298 10*3/uL (ref 150–400)
RBC: 4.16 MIL/uL (ref 3.87–5.11)
RDW: 14.3 % (ref 11.5–15.5)
WBC: 6 10*3/uL (ref 4.0–10.5)

## 2016-05-28 LAB — I-STAT TROPONIN, ED: TROPONIN I, POC: 0 ng/mL (ref 0.00–0.08)

## 2016-05-28 LAB — TROPONIN I

## 2016-05-28 LAB — GLUCOSE, CAPILLARY: GLUCOSE-CAPILLARY: 140 mg/dL — AB (ref 65–99)

## 2016-05-28 LAB — TSH: TSH: 1.777 u[IU]/mL (ref 0.350–4.500)

## 2016-05-28 MED ORDER — ENOXAPARIN SODIUM 40 MG/0.4ML ~~LOC~~ SOLN
40.0000 mg | SUBCUTANEOUS | Status: DC
  Administered 2016-05-28: 40 mg via SUBCUTANEOUS
  Filled 2016-05-28: qty 0.4

## 2016-05-28 MED ORDER — NITROGLYCERIN 0.4 MG SL SUBL: 0.4000 mg | SUBLINGUAL_TABLET | SUBLINGUAL | Status: DC | PRN

## 2016-05-28 MED ORDER — INSULIN ASPART 100 UNIT/ML ~~LOC~~ SOLN
0.0000 [IU] | Freq: Three times a day (TID) | SUBCUTANEOUS | Status: DC
  Administered 2016-05-29: 2 [IU] via SUBCUTANEOUS

## 2016-05-28 MED ORDER — LOSARTAN POTASSIUM 50 MG PO TABS
25.0000 mg | ORAL_TABLET | Freq: Every day | ORAL | Status: DC
  Administered 2016-05-28 – 2016-05-29 (×2): 25 mg via ORAL
  Filled 2016-05-28 (×2): qty 1

## 2016-05-28 MED ORDER — ONDANSETRON HCL 4 MG/2ML IJ SOLN: 4.0000 mg | Freq: Four times a day (QID) | INTRAMUSCULAR | Status: DC | PRN

## 2016-05-28 MED ORDER — NITROGLYCERIN 0.4 MG SL SUBL
0.4000 mg | SUBLINGUAL_TABLET | SUBLINGUAL | Status: DC | PRN
  Administered 2016-05-28: 0.4 mg via SUBLINGUAL
  Filled 2016-05-28: qty 1

## 2016-05-28 MED ORDER — ASPIRIN 81 MG PO CHEW
324.0000 mg | CHEWABLE_TABLET | Freq: Once | ORAL | Status: AC
  Administered 2016-05-28: 324 mg via ORAL
  Filled 2016-05-28: qty 4

## 2016-05-28 MED ORDER — LORAZEPAM 2 MG/ML IJ SOLN: 1.0000 mg | Freq: Once | INTRAMUSCULAR | Status: DC

## 2016-05-28 MED ORDER — ISOSORBIDE MONONITRATE ER 30 MG PO TB24
15.0000 mg | ORAL_TABLET | Freq: Every day | ORAL | Status: DC
  Administered 2016-05-28: 15 mg via ORAL
  Filled 2016-05-28: qty 1

## 2016-05-28 MED ORDER — ASPIRIN EC 81 MG PO TBEC: 81.0000 mg | DELAYED_RELEASE_TABLET | Freq: Every evening | ORAL | Status: DC

## 2016-05-28 MED ORDER — INSULIN ASPART 100 UNIT/ML ~~LOC~~ SOLN: 0.0000 [IU] | Freq: Every day | SUBCUTANEOUS | Status: DC

## 2016-05-28 MED ORDER — MORPHINE SULFATE (PF) 4 MG/ML IV SOLN: 4.0000 mg | INTRAVENOUS | Status: DC | PRN

## 2016-05-28 MED ORDER — ACETAMINOPHEN 325 MG PO TABS
650.0000 mg | ORAL_TABLET | ORAL | Status: DC | PRN
  Administered 2016-05-28 – 2016-05-29 (×2): 650 mg via ORAL
  Filled 2016-05-28 (×2): qty 2

## 2016-05-28 MED ORDER — ONDANSETRON HCL 4 MG/2ML IJ SOLN
4.0000 mg | Freq: Once | INTRAMUSCULAR | Status: AC
  Administered 2016-05-28: 4 mg via INTRAVENOUS
  Filled 2016-05-28: qty 2

## 2016-05-28 NOTE — ED Notes (Signed)
Hospitalist at bedside 

## 2016-05-28 NOTE — ED Triage Notes (Signed)
Pt reports chest tightness, left shoulder pain and shortness of breath. Also reports nausea. Weakness. Denies coughing. Hx acid reflux. Alert and oriented 4.

## 2016-05-28 NOTE — ED Notes (Signed)
Provided patient a cool, wet cloth to place at her neck.

## 2016-05-28 NOTE — ED Provider Notes (Signed)
[facility] Provider Note   CSN: [id number] Arrival date & time: 05/28/16  1339     History   Chief Complaint Chief Complaint  Patient presents with  . Chest Pain  . Shortness of Breath    HPI [name] is a 70 y.o. female.  Pt presents to the ED today with sob and cp.  The pt said that it has been going on for a few days, but worsened today.  Pt denies f/c.  She has never had any cardiac history.  No recent cardiac evaluation.      Past Medical History:  Diagnosis Date  . Bladder infection   . Carpal tunnel syndrome   . Diabetes mellitus   . Kidney stone   . Kidney stones     Patient Active Problem List   Diagnosis Date Noted  . Chest pain 05/28/2016  . UPPER RESPIRATORY INFECTION, VIRAL 03/22/2009  . ALLERGIC RHINITIS CAUSE UNSPECIFIED 11/04/2008  . HYPERLIPIDEMIA 04/11/2008  . COUGH 04/11/2008  . OBSTRUCTIVE SLEEP APNEA 03/21/2008  . CARPAL TUNNEL RELEASE, BILATERAL, HX OF 03/21/2008    Past Surgical History:  Procedure Laterality Date  . ABDOMINAL HYSTERECTOMY     partial  . APPENDECTOMY    . HERNIA REPAIR    . TONSILLECTOMY      OB History    No data available       Home Medications    Prior to Admission medications   Medication Sig Start Date End Date Taking? Authorizing Provider  aspirin EC 81 MG tablet Take 81 mg by mouth every evening.    Yes Historical Provider, MD  Coenzyme Q10 (COQ10) 100 MG CAPS Take 1 capsule by mouth daily.   Yes Historical Provider, MD  ergocalciferol (VITAMIN D2) 50000 units capsule Take 50,000 Units by mouth 2 (two) times a week. Tuesdays and Fridays   Yes Historical Provider, MD  hydrochlorothiazide (MICROZIDE) 12.5 MG capsule Take 12.5 mg by mouth daily as needed. For excess fluid   Yes Historical Provider, MD  losartan (COZAAR) 25 MG tablet Take 25 mg by mouth daily.   Yes Historical Provider, MD  metFORMIN (GLUCOPHAGE) 500 MG tablet Take 500 mg by mouth 2 (two) times daily with a meal.   Yes  Historical Provider, MD  nystatin (MYCOSTATIN/NYSTOP) powder Apply 1 application topically 2 (two) times daily as needed for irritation. 04/29/16  Yes Historical Provider, MD  omega-3 acid ethyl esters (LOVAZA) 1 G capsule Take 2 g by mouth 2 (two) times a week. Monday, Wednesday   Yes Historical Provider, MD  potassium chloride SA (K-DUR,KLOR-CON) 20 MEQ tablet Take 20 mEq by mouth as needed. Take when you take your Hydrochlorothiazide   Yes Historical Provider, MD  VITAMIN E PO Take 1 tablet by mouth daily.    Yes Historical Provider, MD    Family History No family history on file.  Social History Social History  Substance Use Topics  . Smoking status: Never Smoker  . Smokeless tobacco: Never Used  . Alcohol use No     Allergies   Sulfonamide derivatives and Tetanus toxoids   Review of Systems Review of Systems  Respiratory: Positive for shortness of breath.   Cardiovascular: Positive for chest pain.  All other systems reviewed and are negative.    Physical Exam Updated Vital Signs BP 128/75   Pulse 61   Temp 97.8 F (36.6 C) (Oral)   Resp (!) 28   SpO2 100%   Physical Exam  Constitutional: She is  oriented to person, place, and time. She appears well-developed and well-nourished.  HENT:  Head: Normocephalic and atraumatic.  Right Ear: External ear normal.  Left Ear: External ear normal.  Nose: Nose normal.  Mouth/Throat: Oropharynx is clear and moist.  Eyes: Conjunctivae and EOM are normal. Pupils are equal, round, and reactive to light.  Neck: Normal range of motion. Neck supple.  Cardiovascular: Normal rate, regular rhythm, normal heart sounds and intact distal pulses.   Pulmonary/Chest: Effort normal and breath sounds normal.  Abdominal: Soft. Bowel sounds are normal.  Musculoskeletal: Normal range of motion.  Neurological: She is alert and oriented to person, place, and time.  Skin: Skin is warm.  Psychiatric: She has a normal mood and affect. Her behavior  is normal. Judgment and thought content normal.  Nursing note and vitals reviewed.    ED Treatments / Results  Labs (all labs ordered are listed, but only abnormal results are displayed) Labs Reviewed  BASIC METABOLIC PANEL - Abnormal; Notable for the following:       Result Value   Glucose, Bld 163 (*)    All other components within normal limits  CBC - Abnormal; Notable for the following:    Hemoglobin 11.9 (*)    All other components within normal limits  I-STAT TROPOININ, ED    EKG  EKG Interpretation  Date/Time:  Tuesday May 28 2016 13:49:18 EST Ventricular Rate:  56 PR Interval:    QRS Duration: 94 QT Interval:  422 QTC Calculation: 408 R Axis:   -6 Text Interpretation:  Sinus rhythm Abnormal R-wave progression, early transition Left ventricular hypertrophy Confirmed by Particia NearingHAVILAND MD, Gianina Olinde ([id number]) on 05/28/2016 2:25:05 PM       Radiology Dg Chest 2 View  Result Date: 05/28/2016 CLINICAL DATA:  Congestion, chest pain, shortness of breath for 3 weeks EXAM: CHEST  2 VIEW COMPARISON:  12/17/2012 FINDINGS: The heart size and mediastinal contours are within normal limits. Both lungs are clear. The visualized skeletal structures are unremarkable. IMPRESSION: No active cardiopulmonary disease. Electronically Signed   By: Elige KoHetal  Patel   On: 05/28/2016 14:48    Procedures Procedures (including critical care time)  Medications Ordered in ED Medications  nitroGLYCERIN (NITROSTAT) SL tablet 0.4 mg (0.4 mg Sublingual Given 05/28/16 1538)  aspirin chewable tablet 324 mg (324 mg Oral Given 05/28/16 1437)  ondansetron (ZOFRAN) injection 4 mg (4 mg Intravenous Given 05/28/16 1621)     Initial Impression / Assessment and Plan / ED Course  I have reviewed the triage vital signs and the nursing notes.  Pertinent labs & imaging results that were available during my care of the patient were reviewed by me and considered in my medical decision making (see chart for  details).  Clinical Course    Nitro helped cp, but made her nauseous.  Pt has a heart score of 5 with good story, age, htn, diabetes, and high cholesterol.  Pt d/w hospitalist for admission.   Final Clinical Impressions(s) / ED Diagnoses   Final diagnoses:  Chest pain, unspecified type    New Prescriptions New Prescriptions   No medications on file     Jacalyn LefevreJulie Avital Dancy, MD 05/28/16 [phone]

## 2016-05-28 NOTE — ED Notes (Signed)
Will give bedside report.  

## 2016-05-28 NOTE — ED Notes (Signed)
Patient states she feels different, like a choking feeling and "deeper" chest pain. Pt feels nausea. Will inform provider.

## 2016-05-28 NOTE — ED Notes (Signed)
Unable to collect labs patient is not in room she is in xray

## 2016-05-28 NOTE — ED Notes (Signed)
Patient in x-ray unable to give medications.

## 2016-05-28 NOTE — ED Notes (Signed)
Informed Dr. Particia NearingHaviland of patients symptoms. She reports she will assess patient.

## 2016-05-28 NOTE — H&P (Addendum)
History and Physical    Angelica KernsShirley A [name] ZOX:[id number] DOB: 1945/09/07 DOA: 05/28/2016  PCP: Gwynneth AlimentSANDERS,ROBYN N, MD  Patient coming from: Home  Chief Complaint: Chest pain  HPI: Angelica KernsShirley A [name] is a 70 y.o. female with medical history significant of hypertension, hyperlipidemia, diabetes mellitus. She reports heavy chest pain that started around 12pm. She had associated dyspnea, weakness, left arm pain, lightheadedness. Symptoms have been intermittent for the past three weeks. Pain was present during exertion and rest. She reports having a stress test two years ago which she says was okay. She did not try anything to help with her pain.  ED Course: Vitals: Afebrile. Normotensive. On room air Labs: Troponin of 0. Hemoglobin of 11.9 Imaging: Chest x-ray without acute process Medications/Course: Nitro helped with pain somewhat.  Review of Systems: Review of Systems  Constitutional: Negative for chills and fever.  Respiratory: Positive for shortness of breath. Negative for cough, sputum production and wheezing.   Cardiovascular: Positive for chest pain. Negative for palpitations.  Gastrointestinal: Negative for nausea and vomiting.  Neurological: Positive for weakness.    Past Medical History:  Diagnosis Date  . Bladder infection   . Carpal tunnel syndrome   . Diabetes mellitus   . Kidney stone   . Kidney stones     Past Surgical History:  Procedure Laterality Date  . ABDOMINAL HYSTERECTOMY     partial  . APPENDECTOMY    . HERNIA REPAIR    . TONSILLECTOMY       reports that she has never smoked. She has never used smokeless tobacco. She reports that she does not drink alcohol or use drugs.  Allergies  Allergen Reactions  . Sulfonamide Derivatives Itching    REACTION: itch  . Tetanus Toxoids Swelling    Family History  Problem Relation Age of Onset  . Heart attack Father 247  . Stroke Father 4070  . Diabetes Father   . Diabetes Brother   . Diabetes Brother      Prior to Admission medications   Medication Sig Start Date End Date Taking? Authorizing Provider  aspirin EC 81 MG tablet Take 81 mg by mouth every evening.    Yes Historical Provider, MD  Coenzyme Q10 (COQ10) 100 MG CAPS Take 1 capsule by mouth daily.   Yes Historical Provider, MD  ergocalciferol (VITAMIN D2) 50000 units capsule Take 50,000 Units by mouth 2 (two) times a week. Tuesdays and Fridays   Yes Historical Provider, MD  hydrochlorothiazide (MICROZIDE) 12.5 MG capsule Take 12.5 mg by mouth daily as needed. For excess fluid   Yes Historical Provider, MD  losartan (COZAAR) 25 MG tablet Take 25 mg by mouth daily.   Yes Historical Provider, MD  metFORMIN (GLUCOPHAGE) 500 MG tablet Take 500 mg by mouth 2 (two) times daily with a meal.   Yes Historical Provider, MD  nystatin (MYCOSTATIN/NYSTOP) powder Apply 1 application topically 2 (two) times daily as needed for irritation. 04/29/16  Yes Historical Provider, MD  omega-3 acid ethyl esters (LOVAZA) 1 G capsule Take 2 g by mouth 2 (two) times a week. Monday, Wednesday   Yes Historical Provider, MD  potassium chloride SA (K-DUR,KLOR-CON) 20 MEQ tablet Take 20 mEq by mouth as needed. Take when you take your Hydrochlorothiazide   Yes Historical Provider, MD  VITAMIN E PO Take 1 tablet by mouth daily.    Yes Historical Provider, MD    Physical Exam: Vitals:   05/28/16 1348 05/28/16 1530  BP: 141/59 128/75  Pulse: 64 61  Resp: 16 (!) 28  Temp: 97.8 F (36.6 C)   TempSrc: Oral   SpO2: 100% 100%     Constitutional: NAD, calm, comfortable Eyes: PERRL, lids and conjunctivae normal ENMT: Mucous membranes are moist. Posterior pharynx clear of any exudate or lesions.Normal dentition.  Neck: normal, supple, no masses, no thyromegaly Respiratory: clear to auscultation bilaterally, no wheezing, no crackles. Normal respiratory effort. No accessory muscle use.  Cardiovascular: Regular rate and rhythm, no murmurs / rubs / gallops. No extremity  edema. 2+ pedal pulses. Abdomen: no tenderness, no masses palpated. Bowel sounds positive.  Musculoskeletal: significant tenderness on palpation of sternum.  Skin: no rashes, lesions, ulcers. No induration Neurologic: CN 2-12 grossly intact. Sensation intact, DTR normal. Strength 5/5 in all 4.  Psychiatric: Normal judgment and insight. Alert and oriented x 3. Normal mood.    Labs on Admission: I have personally reviewed following labs and imaging studies  CBC:  Recent Labs Lab 05/28/16 1429  WBC 6.0  HGB 11.9*  HCT 36.7  MCV 88.2  PLT 298   Basic Metabolic Panel:  Recent Labs Lab 05/28/16 1429  NA 140  K 3.6  CL 108  CO2 24  GLUCOSE 163*  BUN 12  CREATININE 0.70  CALCIUM 9.3   GFR: CrCl cannot be calculated (Unknown ideal weight.). Liver Function Tests: No results for input(s): AST, ALT, ALKPHOS, BILITOT, PROT, ALBUMIN in the last 168 hours. No results for input(s): LIPASE, AMYLASE in the last 168 hours. No results for input(s): AMMONIA in the last 168 hours. Coagulation Profile: No results for input(s): INR, PROTIME in the last 168 hours. Cardiac Enzymes: No results for input(s): CKTOTAL, CKMB, CKMBINDEX, TROPONINI in the last 168 hours. BNP (last 3 results) No results for input(s): PROBNP in the last 8760 hours. HbA1C: No results for input(s): HGBA1C in the last 72 hours. CBG: No results for input(s): GLUCAP in the last 168 hours. Lipid Profile: No results for input(s): CHOL, HDL, LDLCALC, TRIG, CHOLHDL, LDLDIRECT in the last 72 hours. Thyroid Function Tests: No results for input(s): TSH, T4TOTAL, FREET4, T3FREE, THYROIDAB in the last 72 hours. Anemia Panel: No results for input(s): VITAMINB12, FOLATE, FERRITIN, TIBC, IRON, RETICCTPCT in the last 72 hours. Urine analysis:    Component Value Date/Time   COLORURINE YELLOW 03/06/2012 0833   APPEARANCEUR CLOUDY (A) 03/06/2012 0833   LABSPEC 1.015 03/26/2014 1251   PHURINE 5.5 03/26/2014 1251   GLUCOSEU  NEGATIVE 03/26/2014 1251   HGBUR MODERATE (A) 03/26/2014 1251   BILIRUBINUR NEGATIVE 03/26/2014 1251   KETONESUR NEGATIVE 03/26/2014 1251   PROTEINUR NEGATIVE 03/26/2014 1251   UROBILINOGEN 0.2 03/26/2014 1251   NITRITE NEGATIVE 03/26/2014 1251   LEUKOCYTESUR SMALL (A) 03/26/2014 1251   Sepsis Labs: !!!!!!!!!!!!!!!!!!!!!!!!!!!!!!!!!!!!!!!!!!!! @LABRCNTIP (procalcitonin:4,lacticidven:4) )No results found for this or any previous visit (from the past 240 hour(s)).   Radiological Exams on Admission: Dg Chest 2 View  Result Date: 05/28/2016 CLINICAL DATA:  Congestion, chest pain, shortness of breath for 3 weeks EXAM: CHEST  2 VIEW COMPARISON:  12/17/2012 FINDINGS: The heart size and mediastinal contours are within normal limits. Both lungs are clear. The visualized skeletal structures are unremarkable. IMPRESSION: No active cardiopulmonary disease. Electronically Signed   By: Elige Ko   On: 05/28/2016 14:48    EKG: Independently reviewed. Inverted t-wave in lead III. Lateral leads with unchanged t-wave flattening  Assessment/Plan Principal Problem:   Chest pain Active Problems:   Hyperlipidemia   Essential hypertension   Diabetes mellitus type 2 in obese (HCC)  Chest pain Atypical story. Chest pain is reproducible. Discussed with Dr. Jacinto HalimGanji. Patient's previous stress test was normal. Atypical and highly reproducible. -Dr. Jacinto HalimGanji recommendations: Start Imdur. If still having chest pain in the morning, call him to see patient -cycle troponin -repeat EKG -echocardiogram -TSH/hemoglobin A1C  Hypertension -continue losartan  Diabetes mellitus -SSI   DVT prophylaxis: Lovenox Code Status: Full code Family Communication: Brother at bedside Disposition Plan: Anticipate discharge home in morning Consults called: Cardiology, Dr. Jacinto HalimGanji Admission status: Observation, telemetry   Jacquelin Hawkingalph Nettey, MD [facility] Pager [phone]  If 7PM-7AM, please contact  night-coverage [email or web address] Password [email or web address]  05/28/2016, 4:47 PM

## 2016-05-29 ENCOUNTER — Observation Stay (HOSPITAL_COMMUNITY): Payer: Medicare Other

## 2016-05-29 DIAGNOSIS — R079 Chest pain, unspecified: Secondary | ICD-10-CM | POA: Diagnosis not present

## 2016-05-29 DIAGNOSIS — M79602 Pain in left arm: Secondary | ICD-10-CM | POA: Diagnosis not present

## 2016-05-29 DIAGNOSIS — Z87442 Personal history of urinary calculi: Secondary | ICD-10-CM | POA: Diagnosis not present

## 2016-05-29 DIAGNOSIS — R0609 Other forms of dyspnea: Secondary | ICD-10-CM | POA: Diagnosis not present

## 2016-05-29 DIAGNOSIS — Z6838 Body mass index (BMI) 38.0-38.9, adult: Secondary | ICD-10-CM | POA: Diagnosis not present

## 2016-05-29 DIAGNOSIS — E669 Obesity, unspecified: Secondary | ICD-10-CM | POA: Diagnosis not present

## 2016-05-29 DIAGNOSIS — R0600 Dyspnea, unspecified: Secondary | ICD-10-CM | POA: Diagnosis not present

## 2016-05-29 DIAGNOSIS — E785 Hyperlipidemia, unspecified: Secondary | ICD-10-CM | POA: Diagnosis not present

## 2016-05-29 DIAGNOSIS — G4733 Obstructive sleep apnea (adult) (pediatric): Secondary | ICD-10-CM | POA: Diagnosis not present

## 2016-05-29 DIAGNOSIS — R531 Weakness: Secondary | ICD-10-CM | POA: Diagnosis not present

## 2016-05-29 DIAGNOSIS — E7800 Pure hypercholesterolemia, unspecified: Secondary | ICD-10-CM | POA: Diagnosis not present

## 2016-05-29 DIAGNOSIS — E119 Type 2 diabetes mellitus without complications: Secondary | ICD-10-CM | POA: Diagnosis not present

## 2016-05-29 DIAGNOSIS — I10 Essential (primary) hypertension: Secondary | ICD-10-CM | POA: Diagnosis not present

## 2016-05-29 DIAGNOSIS — R0789 Other chest pain: Secondary | ICD-10-CM | POA: Diagnosis not present

## 2016-05-29 LAB — CBC
HEMATOCRIT: 36.7 % (ref 36.0–46.0)
Hemoglobin: 11.5 g/dL — ABNORMAL LOW (ref 12.0–15.0)
MCH: 28.3 pg (ref 26.0–34.0)
MCHC: 31.3 g/dL (ref 30.0–36.0)
MCV: 90.2 fL (ref 78.0–100.0)
Platelets: 291 10*3/uL (ref 150–400)
RBC: 4.07 MIL/uL (ref 3.87–5.11)
RDW: 14.5 % (ref 11.5–15.5)
WBC: 6.4 10*3/uL (ref 4.0–10.5)

## 2016-05-29 LAB — BASIC METABOLIC PANEL
Anion gap: 7 (ref 5–15)
BUN: 14 mg/dL (ref 6–20)
CHLORIDE: 110 mmol/L (ref 101–111)
CO2: 28 mmol/L (ref 22–32)
Calcium: 9 mg/dL (ref 8.9–10.3)
Creatinine, Ser: 0.91 mg/dL (ref 0.44–1.00)
GFR calc Af Amer: >60 mL/min (ref >60)
GFR calc non Af Amer: >60 mL/min (ref >60)
GLUCOSE: 110 mg/dL — AB (ref 65–99)
POTASSIUM: 4.2 mmol/L (ref 3.5–5.1)
Sodium: 145 mmol/L (ref 135–145)

## 2016-05-29 LAB — LIPID PANEL
Cholesterol: 184 mg/dL (ref 0–200)
HDL: 55 mg/dL (ref >40)
LDL CALC: 106 mg/dL — AB (ref 0–99)
TRIGLYCERIDES: 115 mg/dL (ref <150)
Total CHOL/HDL Ratio: 3.3 RATIO
VLDL: 23 mg/dL (ref 0–40)

## 2016-05-29 LAB — GLUCOSE, CAPILLARY
Glucose-Capillary: 98 mg/dL (ref 65–99)
Glucose-Capillary: 200 mg/dL — ABNORMAL HIGH (ref 65–99)

## 2016-05-29 LAB — ECHOCARDIOGRAM COMPLETE
Height: 63.5 in
Weight: 3555.58 oz

## 2016-05-29 LAB — TROPONIN I

## 2016-05-29 NOTE — Discharge Summary (Signed)
[name], is a 70 y.o. female  DOB 09-07-1945  MRN [id number].  Admission date:  05/28/2016  Admitting Physician  Narda Bondsalph A Nettey, MD  Discharge Date:  05/29/2016   Primary MD  Gwynneth AlimentSANDERS,ROBYN N, MD  Recommendations for primary care physician for things to follow:  - Please check CBC, BMP during next visit - Patient to follow with her primary cardiologist added further workup for nontypical chest pain if indicated, she was not able to tolerate Imdur secondary to headache, and soft blood pressure.   Admission Diagnosis  Chest pain, unspecified type [R07.9]   Discharge Diagnosis  Chest pain, unspecified type [R07.9]    Principal Problem:   Chest pain Active Problems:   Hyperlipidemia   Essential hypertension   Diabetes mellitus type 2 in obese [facility](HCC)      Past Medical History:  Diagnosis Date  . Bladder infection   . Carpal tunnel syndrome   . Diabetes mellitus   . Kidney stone   . Kidney stones     Past Surgical History:  Procedure Laterality Date  . ABDOMINAL HYSTERECTOMY     partial  . APPENDECTOMY    . HERNIA REPAIR    . TONSILLECTOMY         History of present illness and  Hospital Course:     Kindly see H&P for history of present illness and admission details, please review complete Labs, Consult reports and Test reports for all details in brief  HPI  from the history and physical done on the day of admission 05/28/2016 HPI: Angelica KernsShirley A [name] is a 70 y.o. female with medical history significant of hypertension, hyperlipidemia, diabetes mellitus. She reports heavy chest pain that started around 12pm. She had associated dyspnea, weakness, left arm pain, lightheadedness. Symptoms have been intermittent for the past three weeks. Pain was present during exertion and rest. She reports having a stress test two years ago which she says was okay. She did not try anything to help with  her pain.  ED Course: Vitals: Afebrile. Normotensive. On room air Labs: Troponin of 0. Hemoglobin of 11.9 Imaging: Chest x-ray without acute process Medications/Course: Nitro helped with pain somewhat.   Hospital Course   Chest pain - Has nontypical features, nontypical story, reducible on palpation, admitting physician discussed with Dr. Mendel CorningiGangi, stress test within 2 years was normal. - Admitted overnight for observation, negative troponins 3, EKG nonacute, 2-D echo was obtained no systolic dysfunction, no regional wall motion abnormalities. - She will be discharged on her home regimen to follow with Dr. Kennith CenterGann G as an outpatient if further workup is indicated. - Was unable to tolerate Imdur secondary to headache, as well soft blood pressure  Hypertension - Continue with home medication  Diabetes mellitus - Continue with home medication     Discharge Condition: stable   Follow UP  Follow-up Information    SANDERS,ROBYN N, MD Follow up in 1 week(s).   Specialty:  Internal Medicine Contact information: [address] [place] [address] [phone]  Yates DecampGANJI, JAY, MD Follow up in 10 day(s).   Specialty:  Cardiology Contact information: [address] [place] [address] [phone]             Discharge Instructions  and  Discharge Medications     Discharge Instructions    Discharge instructions    Complete by:  As directed    Follow with Primary MD Gwynneth AlimentSANDERS,ROBYN N, MD in 7 days   Get CBC, CMP, checked  by Primary MD next visit.    Activity: As tolerated with Full fall precautions use walker/cane & assistance as needed   Disposition Home    Diet: Heart Healthy , carb modified, with feeding assistance and aspiration precautions.  For Heart failure patients - Check your Weight same time everyday, if you gain over 2 pounds, or you develop in leg swelling, experience more shortness of breath or chest pain, call  your Primary MD immediately. Follow Cardiac Low Salt Diet and 1.5 lit/day fluid restriction.   On your next visit with your primary care physician please Get Medicines reviewed and adjusted.   Please request your Prim.MD to go over all Hospital Tests and Procedure/Radiological results at the follow up, please get all Hospital records sent to your Prim MD by signing hospital release before you go home.   If you experience worsening of your admission symptoms, develop shortness of breath, life threatening emergency, suicidal or homicidal thoughts you must seek medical attention immediately by calling 911 or calling your MD immediately  if symptoms less severe.  You Must read complete instructions/literature along with all the possible adverse reactions/side effects for all the Medicines you take and that have been prescribed to you. Take any new Medicines after you have completely understood and accpet all the possible adverse reactions/side effects.   Do not drive, operating heavy machinery, perform activities at heights, swimming or participation in water activities or provide baby sitting services if your were admitted for syncope or siezures until you have seen by Primary MD or a Neurologist and advised to do so again.  Do not drive when taking Pain medications.    Do not take more than prescribed Pain, Sleep and Anxiety Medications  Special Instructions: If you have smoked or chewed Tobacco  in the last 2 yrs please stop smoking, stop any regular Alcohol  and or any Recreational drug use.  Wear Seat belts while driving.   Please note  You were cared for by a hospitalist during your hospital stay. If you have any questions about your discharge medications or the care you received while you were in the hospital after you are discharged, you can call the unit and asked to speak with the hospitalist on call if the hospitalist that took care of you is not available. Once you are discharged,  your primary care physician will handle any further medical issues. Please note that NO REFILLS for any discharge medications will be authorized once you are discharged, as it is imperative that you return to your primary care physician (or establish a relationship with a primary care physician if you do not have one) for your aftercare needs so that they can reassess your need for medications and monitor your lab values.   Increase activity slowly    Complete by:  As directed        Medication List    TAKE these medications   aspirin EC 81 MG tablet Take 81 mg by mouth every evening.   CoQ10  100 MG Caps Take 1 capsule by mouth daily.   ergocalciferol 50000 units capsule Commonly known as:  VITAMIN D2 Take 50,000 Units by mouth 2 (two) times a week. Tuesdays and Fridays   hydrochlorothiazide 12.5 MG capsule Commonly known as:  MICROZIDE Take 12.5 mg by mouth daily as needed. For excess fluid   losartan 25 MG tablet Commonly known as:  COZAAR Take 25 mg by mouth daily.   metFORMIN 500 MG tablet Commonly known as:  GLUCOPHAGE Take 500 mg by mouth 2 (two) times daily with a meal.   nystatin powder Commonly known as:  MYCOSTATIN/NYSTOP Apply 1 application topically 2 (two) times daily as needed for irritation.   omega-3 acid ethyl esters 1 g capsule Commonly known as:  LOVAZA Take 2 g by mouth 2 (two) times a week. Monday, Wednesday   potassium chloride SA 20 MEQ tablet Commonly known as:  K-DUR,KLOR-CON Take 20 mEq by mouth as needed. Take when you take your Hydrochlorothiazide   VITAMIN E PO Take 1 tablet by mouth daily.         Diet and Activity recommendation: See Discharge Instructions above   Consults obtained -  none   Major procedures and Radiology Reports - PLEASE review detailed and final reports for all details, in brief -     Dg Chest 2 View  Result Date: 05/28/2016 CLINICAL DATA:  Congestion, chest pain, shortness of breath for 3 weeks EXAM:  CHEST  2 VIEW COMPARISON:  12/17/2012 FINDINGS: The heart size and mediastinal contours are within normal limits. Both lungs are clear. The visualized skeletal structures are unremarkable. IMPRESSION: No active cardiopulmonary disease. Electronically Signed   By: Elige Ko   On: 05/28/2016 14:48    Micro Results    No results found for this or any previous visit (from the past 240 hour(s)).     Today   Subjective:   [name] today has no Recurrence of chest pain , no new weakness tingling or numbness, feels much better wants to go home today. She complains of headache  Objective:   Blood pressure (!) 104/50, pulse (!) 48, temperature 98.2 F (36.8 C), temperature source Oral, resp. rate 20, height 5' 3.5" (1.613 m), weight 100.8 kg (222 lb 3.6 oz), SpO2 100 %.   Intake/Output Summary (Last 24 hours) at 05/29/16 1356 Last data filed at 05/29/16 0615  Gross per 24 hour  Intake              120 ml  Output             1000 ml  Net             -880 ml    Exam Awake Alert, Oriented x 3, No new F.N deficits, Normal affect [place].AT,PERRAL Supple Neck,No JVD, No cervical lymphadenopathy appriciated.  Symmetrical Chest wall movement, Good air movement bilaterally, CTAB RRR,No Gallops,Rubs or new Murmurs, No Parasternal Heave, has reproducible midsternal chest pain on palpation +ve B.Sounds, Abd Soft, Non tender, No organomegaly appriciated, No rebound -guarding or rigidity. No Cyanosis, Clubbing or edema, No new Rash or bruise  Data Review   CBC w Diff: Lab Results  Component Value Date   WBC 6.4 05/29/2016   HGB 11.5 (L) 05/29/2016   HCT 36.7 05/29/2016   PLT 291 05/29/2016   LYMPHOPCT 35 03/06/2012   MONOPCT 8 03/06/2012   EOSPCT 6 (H) 03/06/2012   BASOPCT 1 03/06/2012    CMP: Lab Results  Component Value Date  NA 145 05/29/2016   K 4.2 05/29/2016   CL 110 05/29/2016   CO2 28 05/29/2016   BUN 14 05/29/2016   CREATININE 0.91 05/29/2016   PROT 6.1 01/31/2015    ALBUMIN 3.5 (L) 01/31/2015   BILITOT 0.3 01/31/2015   ALKPHOS 89 01/31/2015   AST 12 01/31/2015   ALT 10 01/31/2015  .   Total Time in preparing paper work, data evaluation and todays exam - 35 minutes  ELGERGAWY, DAWOOD M.D on 05/29/2016 at 1:56 PM  [facility]   Office  [phone]

## 2016-05-29 NOTE — Progress Notes (Signed)
  Echocardiogram 2D Echocardiogram has been performed.  Leta JunglingCooper, Adonys Wildes M 05/29/2016, 12:37 PM

## 2016-05-29 NOTE — Progress Notes (Signed)
Pt given discharge teaching and medication teaching. Pt verbalized understanding of all discharge teaching. VSS and no acute changes noted with Pt's assessment at time of discharge.

## 2016-05-29 NOTE — Discharge Instructions (Signed)
Follow with Primary MD Gwynneth AlimentSANDERS,ROBYN N, MD in 7 days   Get CBC, CMP, checked  by Primary MD next visit.    Activity: As tolerated with Full fall precautions use walker/cane & assistance as needed   Disposition Home    Diet: Heart Healthy , carb modified, with feeding assistance and aspiration precautions.  For Heart failure patients - Check your Weight same time everyday, if you gain over 2 pounds, or you develop in leg swelling, experience more shortness of breath or chest pain, call your Primary MD immediately. Follow Cardiac Low Salt Diet and 1.5 lit/day fluid restriction.   On your next visit with your primary care physician please Get Medicines reviewed and adjusted.   Please request your Prim.MD to go over all Hospital Tests and Procedure/Radiological results at the follow up, please get all Hospital records sent to your Prim MD by signing hospital release before you go home.   If you experience worsening of your admission symptoms, develop shortness of breath, life threatening emergency, suicidal or homicidal thoughts you must seek medical attention immediately by calling 911 or calling your MD immediately  if symptoms less severe.  You Must read complete instructions/literature along with all the possible adverse reactions/side effects for all the Medicines you take and that have been prescribed to you. Take any new Medicines after you have completely understood and accpet all the possible adverse reactions/side effects.   Do not drive, operating heavy machinery, perform activities at heights, swimming or participation in water activities or provide baby sitting services if your were admitted for syncope or siezures until you have seen by Primary MD or a Neurologist and advised to do so again.  Do not drive when taking Pain medications.    Do not take more than prescribed Pain, Sleep and Anxiety Medications  Special Instructions: If you have smoked or chewed Tobacco  in the  last 2 yrs please stop smoking, stop any regular Alcohol  and or any Recreational drug use.  Wear Seat belts while driving.   Please note  You were cared for by a hospitalist during your hospital stay. If you have any questions about your discharge medications or the care you received while you were in the hospital after you are discharged, you can call the unit and asked to speak with the hospitalist on call if the hospitalist that took care of you is not available. Once you are discharged, your primary care physician will handle any further medical issues. Please note that NO REFILLS for any discharge medications will be authorized once you are discharged, as it is imperative that you return to your primary care physician (or establish a relationship with a primary care physician if you do not have one) for your aftercare needs so that they can reassess your need for medications and monitor your lab values.

## 2016-05-30 LAB — HEMOGLOBIN A1C
Hgb A1c MFr Bld: 6.2 % — ABNORMAL HIGH (ref 4.8–5.6)
Mean Plasma Glucose: 131 mg/dL

## 2016-06-04 DIAGNOSIS — R3129 Other microscopic hematuria: Secondary | ICD-10-CM | POA: Diagnosis not present

## 2016-06-04 DIAGNOSIS — R102 Pelvic and perineal pain: Secondary | ICD-10-CM | POA: Diagnosis not present

## 2016-06-05 DIAGNOSIS — R102 Pelvic and perineal pain: Secondary | ICD-10-CM | POA: Diagnosis not present

## 2016-06-05 DIAGNOSIS — R0789 Other chest pain: Secondary | ICD-10-CM | POA: Diagnosis not present

## 2016-06-05 DIAGNOSIS — E119 Type 2 diabetes mellitus without complications: Secondary | ICD-10-CM | POA: Diagnosis not present

## 2016-06-05 DIAGNOSIS — R3129 Other microscopic hematuria: Secondary | ICD-10-CM | POA: Diagnosis not present

## 2016-06-05 DIAGNOSIS — I10 Essential (primary) hypertension: Secondary | ICD-10-CM | POA: Diagnosis not present

## 2016-06-05 DIAGNOSIS — E7800 Pure hypercholesterolemia, unspecified: Secondary | ICD-10-CM | POA: Diagnosis not present

## 2016-06-10 DIAGNOSIS — R0602 Shortness of breath: Secondary | ICD-10-CM | POA: Diagnosis not present

## 2016-06-10 DIAGNOSIS — I10 Essential (primary) hypertension: Secondary | ICD-10-CM | POA: Diagnosis not present

## 2016-06-10 DIAGNOSIS — R0789 Other chest pain: Secondary | ICD-10-CM | POA: Diagnosis not present

## 2016-06-13 DIAGNOSIS — Z09 Encounter for follow-up examination after completed treatment for conditions other than malignant neoplasm: Secondary | ICD-10-CM | POA: Diagnosis not present

## 2016-06-13 DIAGNOSIS — R0600 Dyspnea, unspecified: Secondary | ICD-10-CM | POA: Diagnosis not present

## 2016-06-13 DIAGNOSIS — R079 Chest pain, unspecified: Secondary | ICD-10-CM | POA: Diagnosis not present

## 2017-05-30 DIAGNOSIS — H25813 Combined forms of age-related cataract, bilateral: Secondary | ICD-10-CM | POA: Diagnosis not present

## 2017-05-30 DIAGNOSIS — H353131 Nonexudative age-related macular degeneration, bilateral, early dry stage: Secondary | ICD-10-CM | POA: Diagnosis not present

## 2017-05-30 DIAGNOSIS — H35363 Drusen (degenerative) of macula, bilateral: Secondary | ICD-10-CM | POA: Diagnosis not present

## 2017-05-30 DIAGNOSIS — H10413 Chronic giant papillary conjunctivitis, bilateral: Secondary | ICD-10-CM | POA: Diagnosis not present

## 2017-05-30 DIAGNOSIS — E119 Type 2 diabetes mellitus without complications: Secondary | ICD-10-CM | POA: Diagnosis not present

## 2017-05-30 DIAGNOSIS — H04123 Dry eye syndrome of bilateral lacrimal glands: Secondary | ICD-10-CM | POA: Diagnosis not present

## 2017-06-12 DIAGNOSIS — E1165 Type 2 diabetes mellitus with hyperglycemia: Secondary | ICD-10-CM | POA: Diagnosis not present

## 2017-06-12 DIAGNOSIS — E559 Vitamin D deficiency, unspecified: Secondary | ICD-10-CM | POA: Diagnosis not present

## 2017-06-19 ENCOUNTER — Other Ambulatory Visit: Payer: Self-pay | Admitting: Internal Medicine

## 2017-06-19 DIAGNOSIS — N644 Mastodynia: Secondary | ICD-10-CM

## 2017-06-27 ENCOUNTER — Ambulatory Visit
Admission: RE | Admit: 2017-06-27 | Discharge: 2017-06-27 | Disposition: A | Discharge status: Home / Self Care | Payer: Medicare Other | Source: Ambulatory Visit | Attending: Internal Medicine | Admitting: Internal Medicine

## 2017-06-27 ENCOUNTER — Ambulatory Visit
Admission: RE | Admit: 2017-06-27 | Discharge: 2017-06-27 | Disposition: A | Discharge status: Home / Self Care | Payer: Medicare HMO | Source: Ambulatory Visit | Attending: Internal Medicine | Admitting: Internal Medicine

## 2017-06-27 ENCOUNTER — Ambulatory Visit: Payer: Medicare Other

## 2017-06-27 ENCOUNTER — Other Ambulatory Visit: Payer: Self-pay | Admitting: Internal Medicine

## 2017-06-27 DIAGNOSIS — N644 Mastodynia: Secondary | ICD-10-CM

## 2017-06-27 DIAGNOSIS — R928 Other abnormal and inconclusive findings on diagnostic imaging of breast: Secondary | ICD-10-CM | POA: Diagnosis not present

## 2017-07-01 DIAGNOSIS — M545 Low back pain: Secondary | ICD-10-CM | POA: Diagnosis not present

## 2017-07-01 DIAGNOSIS — M25551 Pain in right hip: Secondary | ICD-10-CM | POA: Diagnosis not present

## 2017-07-21 DIAGNOSIS — R3129 Other microscopic hematuria: Secondary | ICD-10-CM | POA: Diagnosis not present

## 2017-07-21 DIAGNOSIS — R102 Pelvic and perineal pain: Secondary | ICD-10-CM | POA: Diagnosis not present

## 2017-07-31 DIAGNOSIS — J322 Chronic ethmoidal sinusitis: Secondary | ICD-10-CM | POA: Diagnosis not present

## 2017-07-31 DIAGNOSIS — J320 Chronic maxillary sinusitis: Secondary | ICD-10-CM | POA: Diagnosis not present

## 2017-07-31 DIAGNOSIS — J370 Chronic laryngitis: Secondary | ICD-10-CM | POA: Diagnosis not present

## 2017-08-01 ENCOUNTER — Other Ambulatory Visit: Payer: Self-pay

## 2017-08-01 ENCOUNTER — Observation Stay (HOSPITAL_COMMUNITY)
Admission: EM | Admit: 2017-08-01 | Discharge: 2017-08-03 | Disposition: A | Discharge status: Home / Self Care | Payer: Medicare HMO | Attending: Internal Medicine | Admitting: Internal Medicine

## 2017-08-01 ENCOUNTER — Encounter (HOSPITAL_COMMUNITY): Payer: Self-pay

## 2017-08-01 ENCOUNTER — Emergency Department (HOSPITAL_COMMUNITY): Payer: Medicare HMO

## 2017-08-01 DIAGNOSIS — Z7982 Long term (current) use of aspirin: Secondary | ICD-10-CM | POA: Diagnosis not present

## 2017-08-01 DIAGNOSIS — E7800 Pure hypercholesterolemia, unspecified: Secondary | ICD-10-CM | POA: Diagnosis not present

## 2017-08-01 DIAGNOSIS — R0600 Dyspnea, unspecified: Secondary | ICD-10-CM | POA: Diagnosis present

## 2017-08-01 DIAGNOSIS — E1169 Type 2 diabetes mellitus with other specified complication: Secondary | ICD-10-CM | POA: Diagnosis not present

## 2017-08-01 DIAGNOSIS — I498 Other specified cardiac arrhythmias: Secondary | ICD-10-CM

## 2017-08-01 DIAGNOSIS — Z79899 Other long term (current) drug therapy: Secondary | ICD-10-CM | POA: Diagnosis not present

## 2017-08-01 DIAGNOSIS — I10 Essential (primary) hypertension: Secondary | ICD-10-CM | POA: Diagnosis not present

## 2017-08-01 DIAGNOSIS — E669 Obesity, unspecified: Secondary | ICD-10-CM | POA: Diagnosis not present

## 2017-08-01 DIAGNOSIS — R079 Chest pain, unspecified: Secondary | ICD-10-CM | POA: Diagnosis present

## 2017-08-01 DIAGNOSIS — M940 Chondrocostal junction syndrome [Tietze]: Secondary | ICD-10-CM | POA: Clinically undetermined

## 2017-08-01 DIAGNOSIS — R008 Other abnormalities of heart beat: Secondary | ICD-10-CM | POA: Diagnosis not present

## 2017-08-01 DIAGNOSIS — R0609 Other forms of dyspnea: Secondary | ICD-10-CM | POA: Diagnosis not present

## 2017-08-01 DIAGNOSIS — E119 Type 2 diabetes mellitus without complications: Secondary | ICD-10-CM | POA: Insufficient documentation

## 2017-08-01 DIAGNOSIS — R05 Cough: Secondary | ICD-10-CM | POA: Diagnosis not present

## 2017-08-01 DIAGNOSIS — I493 Ventricular premature depolarization: Secondary | ICD-10-CM | POA: Clinically undetermined

## 2017-08-01 DIAGNOSIS — Z7984 Long term (current) use of oral hypoglycemic drugs: Secondary | ICD-10-CM | POA: Diagnosis not present

## 2017-08-01 DIAGNOSIS — R072 Precordial pain: Secondary | ICD-10-CM | POA: Diagnosis not present

## 2017-08-01 DIAGNOSIS — R0602 Shortness of breath: Secondary | ICD-10-CM | POA: Diagnosis not present

## 2017-08-01 DIAGNOSIS — I499 Cardiac arrhythmia, unspecified: Secondary | ICD-10-CM

## 2017-08-01 LAB — I-STAT TROPONIN, ED: TROPONIN I, POC: 0 ng/mL (ref 0.00–0.08)

## 2017-08-01 LAB — BASIC METABOLIC PANEL
ANION GAP: 13 (ref 5–15)
BUN: 9 mg/dL (ref 6–20)
CALCIUM: 9.3 mg/dL (ref 8.9–10.3)
CO2: 20 mmol/L — ABNORMAL LOW (ref 22–32)
CREATININE: 0.78 mg/dL (ref 0.44–1.00)
Chloride: 109 mmol/L (ref 101–111)
GFR calc Af Amer: >60 mL/min (ref >60)
GLUCOSE: 106 mg/dL — AB (ref 65–99)
Potassium: 4.3 mmol/L (ref 3.5–5.1)
Sodium: 142 mmol/L (ref 135–145)

## 2017-08-01 LAB — CBC
HCT: 39.3 % (ref 36.0–46.0)
Hemoglobin: 12.5 g/dL (ref 12.0–15.0)
MCH: 28.7 pg (ref 26.0–34.0)
MCHC: 31.8 g/dL (ref 30.0–36.0)
MCV: 90.1 fL (ref 78.0–100.0)
Platelets: 216 10*3/uL (ref 150–400)
RBC: 4.36 MIL/uL (ref 3.87–5.11)
RDW: 15.3 % (ref 11.5–15.5)
WBC: 5.7 10*3/uL (ref 4.0–10.5)

## 2017-08-01 LAB — GLUCOSE, CAPILLARY: Glucose-Capillary: 91 mg/dL (ref 65–99)

## 2017-08-01 LAB — TROPONIN I

## 2017-08-01 LAB — C-REACTIVE PROTEIN: CRP: 1.3 mg/dL — ABNORMAL HIGH (ref <1.0)

## 2017-08-01 LAB — BRAIN NATRIURETIC PEPTIDE: B Natriuretic Peptide: 41.5 pg/mL (ref 0.0–100.0)

## 2017-08-01 LAB — D-DIMER, QUANTITATIVE: D-Dimer, Quant: 0.76 ug/mL-FEU — ABNORMAL HIGH (ref 0.00–0.50)

## 2017-08-01 MED ORDER — NITROGLYCERIN 0.4 MG SL SUBL: 0.4000 mg | SUBLINGUAL_TABLET | SUBLINGUAL | Status: DC | PRN

## 2017-08-01 MED ORDER — ONDANSETRON HCL 4 MG/2ML IJ SOLN
4.0000 mg | Freq: Four times a day (QID) | INTRAMUSCULAR | Status: DC | PRN
  Administered 2017-08-01: 4 mg via INTRAVENOUS
  Filled 2017-08-01: qty 2

## 2017-08-01 MED ORDER — ACETAMINOPHEN 325 MG PO TABS: 650.0000 mg | ORAL_TABLET | ORAL | Status: DC | PRN

## 2017-08-01 MED ORDER — ENOXAPARIN SODIUM 40 MG/0.4ML ~~LOC~~ SOLN
40.0000 mg | SUBCUTANEOUS | Status: DC
  Administered 2017-08-02 (×2): 40 mg via SUBCUTANEOUS
  Filled 2017-08-01 (×2): qty 0.4

## 2017-08-01 MED ORDER — OXYBUTYNIN CHLORIDE 5 MG PO TABS: 5.0000 mg | ORAL_TABLET | Freq: Three times a day (TID) | ORAL | Status: DC | PRN

## 2017-08-01 MED ORDER — INSULIN ASPART 100 UNIT/ML ~~LOC~~ SOLN: 0.0000 [IU] | Freq: Every day | SUBCUTANEOUS | Status: DC

## 2017-08-01 MED ORDER — MORPHINE SULFATE (PF) 4 MG/ML IV SOLN
3.0000 mg | INTRAVENOUS | Status: DC | PRN
  Administered 2017-08-01: 3 mg via INTRAVENOUS
  Filled 2017-08-01: qty 1

## 2017-08-01 MED ORDER — KETOROLAC TROMETHAMINE 15 MG/ML IJ SOLN
15.0000 mg | Freq: Four times a day (QID) | INTRAMUSCULAR | Status: DC | PRN
  Administered 2017-08-03: 15 mg via INTRAVENOUS
  Filled 2017-08-01: qty 1

## 2017-08-01 MED ORDER — ASPIRIN 81 MG PO CHEW
324.0000 mg | CHEWABLE_TABLET | ORAL | Status: AC
  Administered 2017-08-02: 324 mg via ORAL
  Filled 2017-08-01: qty 4

## 2017-08-01 MED ORDER — ALPRAZOLAM 0.25 MG PO TABS: 0.2500 mg | ORAL_TABLET | Freq: Two times a day (BID) | ORAL | Status: DC | PRN

## 2017-08-01 MED ORDER — ASPIRIN EC 81 MG PO TBEC
81.0000 mg | DELAYED_RELEASE_TABLET | Freq: Every day | ORAL | Status: DC
  Administered 2017-08-02 – 2017-08-03 (×2): 81 mg via ORAL
  Filled 2017-08-01 (×2): qty 1

## 2017-08-01 MED ORDER — ASPIRIN EC 81 MG PO TBEC: 81.0000 mg | DELAYED_RELEASE_TABLET | Freq: Every day | ORAL | Status: DC

## 2017-08-01 MED ORDER — LOSARTAN POTASSIUM 25 MG PO TABS: 25.0000 mg | ORAL_TABLET | ORAL | Status: DC

## 2017-08-01 MED ORDER — ASPIRIN 300 MG RE SUPP: 300.0000 mg | RECTAL | Status: AC

## 2017-08-01 MED ORDER — IOPAMIDOL (ISOVUE-370) INJECTION 76%
INTRAVENOUS | Status: AC
  Administered 2017-08-01: 100 mL
  Filled 2017-08-01: qty 100

## 2017-08-01 MED ORDER — INSULIN ASPART 100 UNIT/ML ~~LOC~~ SOLN: 0.0000 [IU] | Freq: Three times a day (TID) | SUBCUTANEOUS | Status: DC

## 2017-08-01 MED ORDER — CEFUROXIME AXETIL 250 MG PO TABS
250.0000 mg | ORAL_TABLET | Freq: Two times a day (BID) | ORAL | Status: DC
  Administered 2017-08-02 – 2017-08-03 (×4): 250 mg via ORAL
  Filled 2017-08-01 (×4): qty 1

## 2017-08-01 NOTE — ED Provider Notes (Signed)
[facility]  CSN: [id number] Arrival date & time: 08/01/17 1249  Chief Complaint(s) Shortness of Breath and Chest Pain  HPI [name] is a 72 y.o. female    Chest Pain   This is a new problem. Episode onset: 1 week. The problem occurs constantly. The problem has been gradually worsening. The pain is associated with rest and exertion. The pain is present in the substernal region. The pain is moderate. The quality of the pain is described as heavy and exertional. The pain does not radiate. The symptoms are aggravated by certain positions, exertion and deep breathing. Associated symptoms include cough, lower extremity edema, malaise/fatigue, orthopnea and shortness of breath (DOE). Pertinent negatives include no back pain, no fever, no leg pain, no nausea and no sputum production. She has tried nothing for the symptoms. Risk factors include being elderly.  Her past medical history is significant for diabetes, hyperlipidemia and hypertension.  Pertinent negatives for past medical history include no CAD, no cancer, no CHF, no DVT, no MI, no PE, no strokes and no TIA.  Procedure history is positive for stress echo.   Patient was seen by cardiologist today, Dr. Jacinto HalimGanji, who recommended patient have cardiac workup in the emergency department.  If negative, be admitted to the hospitalist for ACS rule out.  Past Medical History Past Medical History:  Diagnosis Date  . Bladder infection   . Carpal tunnel syndrome   . Diabetes mellitus   . Kidney stone   . Kidney stones    Patient Active Problem List   Diagnosis Date Noted  . Chest pain 05/28/2016  . Essential hypertension 05/28/2016  . Diabetes mellitus type 2 in obese (HCC) 05/28/2016  . UPPER RESPIRATORY INFECTION, VIRAL 03/22/2009  . ALLERGIC RHINITIS CAUSE UNSPECIFIED 11/04/2008  . Hyperlipidemia 04/11/2008  . COUGH 04/11/2008  . OBSTRUCTIVE SLEEP APNEA 03/21/2008  . CARPAL  TUNNEL RELEASE, BILATERAL, HX OF 03/21/2008   Home Medication(s) Prior to Admission medications   Medication Sig Start Date End Date Taking? Authorizing Provider  aspirin EC 81 MG tablet Take 81 mg by mouth daily.    Yes [provider]  cefUROXime (CEFTIN) 250 MG tablet Take 250 mg by mouth 2 (two) times daily. 10 day course started 07/31/17 am 07/31/17  Yes [provider]  Cod Liver Oil CAPS Take 1 capsule by mouth 2 (two) times a week.   Yes [provider]  Coenzyme Q10 (COQ10) 100 MG CAPS Take 100 mg by mouth daily.    Yes [provider]  ergocalciferol (VITAMIN D2) 50000 units capsule Take 50,000 Units by mouth 2 (two) times a week. Tuesdays and Fridays   Yes [provider]  fluconazole (DIFLUCAN) 100 MG tablet Take 100 mg by mouth See admin instructions. Take 1 tablet (100 mg) by mouth if needed for yeast from antibiotic use, then take 2nd tablet in 5 days. 07/31/17  Yes [provider]  hydrochlorothiazide (MICROZIDE) 12.5 MG capsule Take 12.5 mg by mouth daily as needed (excess fluid/swelling).    Yes [provider]  losartan (COZAAR) 25 MG tablet Take 25 mg by mouth See admin instructions. Take one tablet (25 mg) by mouth twice weekly - Tuesdays and Thursdays   Yes [provider]  metFORMIN (GLUCOPHAGE) 500 MG tablet Take 500 mg by mouth 2 (two) times daily with a meal.   Yes [provider]  nystatin (MYCOSTATIN/NYSTOP) powder Apply 1 application topically 2 (two) times daily as  needed (irritation/heat rash).  04/29/16  Yes [provider]  oxybutynin (DITROPAN) 5 MG tablet Take 5 mg by mouth every 8 (eight) hours as needed for bladder spasms.  07/21/17  Yes [provider]  potassium chloride SA (K-DUR,KLOR-CON) 20 MEQ tablet Take 20 mEq by mouth daily as needed (with each dose of hydrochlorothiazide).    Yes [provider]                                                                                                                                     Past Surgical History Past Surgical History:  Procedure Laterality Date  . ABDOMINAL HYSTERECTOMY     partial  . APPENDECTOMY    . HERNIA REPAIR    . TONSILLECTOMY     Family History Family History  Problem Relation Age of Onset  . Heart attack Father 45  . Stroke Father 79  . Diabetes Father   . Diabetes Brother   . Diabetes Brother   . Breast cancer Sister 8    Social History Social History   Tobacco Use  . Smoking status: Never Smoker  . Smokeless tobacco: Never Used  Substance Use Topics  . Alcohol use: No  . Drug use: No   Allergies Sulfa antibiotics; Sulfonamide derivatives; and Tetanus toxoids  Review of Systems Review of Systems  Constitutional: Positive for malaise/fatigue. Negative for fever.  Respiratory: Positive for cough and shortness of breath (DOE). Negative for sputum production.   Cardiovascular: Positive for chest pain and orthopnea.  Gastrointestinal: Negative for nausea.  Musculoskeletal: Negative for back pain.   All other systems are reviewed and are negative for acute change except as noted in the HPI  Physical Exam Vital Signs  I have reviewed the triage vital signs BP 137/73 (BP Location: Right Arm)   Pulse 62   Temp 98.4 F (36.9 C) (Oral)   Resp 18   SpO2 99%   Physical Exam  Constitutional: She is oriented to person, place, and time. She appears well-developed and well-nourished. No distress.  HENT:  Head: Normocephalic and atraumatic.  Nose: Nose normal.  Eyes: Conjunctivae and EOM are normal. Pupils are equal, round, and reactive to light. Right eye exhibits no discharge. Left eye exhibits no discharge. No scleral icterus.  Neck: Normal range of motion. Neck supple.  Cardiovascular: Normal rate. A regularly irregular rhythm present. Exam reveals no gallop and no friction rub.  No murmur heard. Pulmonary/Chest: Effort normal and breath sounds normal. No  stridor. No respiratory distress. She has no rales.  Abdominal: Soft. She exhibits no distension. There is no tenderness.  Musculoskeletal: She exhibits no edema or tenderness.  1+ bilateral lower extremity edema  Neurological: She is alert and oriented to person, place, and time.  Skin: Skin is warm and dry. No rash noted. She is not diaphoretic. No erythema.  Psychiatric: She has a normal mood and affect.  Vitals  reviewed.   ED Results and Treatments Labs (all labs ordered are listed, but only abnormal results are displayed) Labs Reviewed  BASIC METABOLIC PANEL - Abnormal; Notable for the following components:      Result Value   CO2 20 (*)    Glucose, Bld 106 (*)    All other components within normal limits  D-DIMER, QUANTITATIVE (NOT AT [facility]) - Abnormal; Notable for the following components:   D-Dimer, Quant 0.76 (*)    All other components within normal limits  CBC  BRAIN NATRIURETIC PEPTIDE  I-STAT TROPONIN, ED                                                                                                                         EKG  EKG Interpretation  Date/Time:  Friday August 01 2017 12:55:09 EST Ventricular Rate:  70 PR Interval:  124 QRS Duration: 84 QT Interval:  384 QTC Calculation: 414 R Axis:   0 Text Interpretation:  Normal sinus rhythm Nonspecific T wave abnormality Abnormal ECG No significant change since last tracing Confirmed by Drema Pry [phone]) on 08/01/2017 5:52:00 PM      Radiology Dg Chest 2 View  Result Date: 08/01/2017 CLINICAL DATA:  Shortness of breath and cough EXAM: CHEST  2 VIEW COMPARISON:  May 28, 2016 FINDINGS: There is no edema or consolidation. The heart size and pulmonary vascularity are normal. No adenopathy. There is degenerative change in the thoracic spine. IMPRESSION: No edema or consolidation. Electronically Signed   By: Bretta Bang III M.D.   On: 08/01/2017 13:55   Ct Angio Chest Pe W And/or Wo Contrast  Result  Date: 08/01/2017 CLINICAL DATA:  Intermittent shortness of breath with positive D-dimer EXAM: CT ANGIOGRAPHY CHEST WITH CONTRAST TECHNIQUE: Multidetector CT imaging of the chest was performed using the standard protocol during bolus administration of intravenous contrast. Multiplanar CT image reconstructions and MIPs were obtained to evaluate the vascular anatomy. CONTRAST:  65 mL Isovue 370 intravenous COMPARISON:  Chest x-ray 08/01/2017, CT chest 04/01/2006 FINDINGS: Cardiovascular: Satisfactory opacification of the pulmonary arteries to the segmental level. No evidence of pulmonary embolism. Nonaneurysmal aorta. Minimal atherosclerotic calcification. Minimal coronary vessel calcification. Borderline cardiomegaly. No pericardial effusion Mediastinum/Nodes: No enlarged mediastinal, hilar, or axillary lymph nodes. Thyroid gland, trachea, and esophagus demonstrate no significant findings. Lungs/Pleura: Lungs are clear. No pleural effusion or pneumothorax. Upper Abdomen: No acute abnormality. Musculoskeletal: Kyphosis of the spine with degenerative changes. No acute or suspicious lesion Review of the MIP images confirms the above findings. IMPRESSION: Negative.  No CT evidence for acute pulmonary embolus. Aortic Atherosclerosis (ICD10-I70.0). Electronically Signed   By: Jasmine Pang M.D.   On: 08/01/2017 21:21   Pertinent labs & imaging results that were available during my care of the patient were reviewed by me and considered in my medical decision making (see chart for details).  Medications Ordered in ED Medications  iopamidol (ISOVUE-370) 76 % injection (100 mLs  Contrast Given 08/01/17 2054)  Procedures Procedures  (including critical care time)  Medical Decision Making / ED Course I have reviewed the nursing notes for this encounter and the patient's prior records (if  available in EHR or on provided paperwork).     On tele noted to have frequent PVCs and bigeminy.  1 week of worsening dyspnea on exertion with intermittent chest pain.  EKG without acute ischemic changes or evidence of pericarditis.  Initial troponin negative.  Given the history of dyspnea on exertion and lower extremity edema, BNP was ordered and was within normal limits. Chest x-ray without evidence suggestive of pneumonia, pneumothorax, pneumomediastinum.  No abnormal contour of the mediastinum to suggest dissection. No evidence of acute injuries.  Dimer was obtained to assess for possible pulmonary embolism it was above the age-adjusted limit.  CTA obtained which was negative for PEs.  Will discuss case with hospitalist.   Final Clinical Impression(s) / ED Diagnoses Final diagnoses:  DOE (dyspnea on exertion)  Bigeminy      This chart was dictated using voice recognition software.  Despite best efforts to proofread,  errors can occur which can change the documentation meaning.   Nira Conn, MD 08/01/17 2311

## 2017-08-01 NOTE — ED Notes (Signed)
ED Provider at bedside. 

## 2017-08-01 NOTE — ED Triage Notes (Signed)
Pt states X1 week she has had chest discomfort with shortness of breath. Pt states pain is worse when she lays down. Denies cardiac hx. Skin warm and dry. Pt also reports frequent cough with the shortness of breath.

## 2017-08-01 NOTE — ED Notes (Signed)
Opyd, MD at bedside 

## 2017-08-01 NOTE — H&P (Signed)
History and Physical    [name]:[id number] DOB: 02-Sep-1945 DOA: 08/01/2017  PCP: Angelica Peng, MD   Patient coming from: Home  Chief Complaint: Dry cough, SOB, chest pain  HPI: [name] is a 72 y.o. female with medical history significant for hypertension and type 2 diabetes mellitus, now presenting to the emergency department for evaluation of nonproductive cough, shortness of breath, and chest pain.  Patient reports that she had been in her usual state of health until approximately 1 week ago when she noted the insidious development of dry cough with dyspnea and chest discomfort.  She reports that the symptoms developed at rest or with exertion, but do seem to worsen with exertion.  Chest pain is described as sharp, worse with deep breath or cough, and worse with lying on her back.  She denies fevers or chills.  Denies sick contacts.  Reports mild rhinorrhea.  Reports occasional lower extremity swelling, but none currently.  ED Course: Upon arrival to the ED, patient is found to be afebrile, saturating well on room air, slightly bradycardic, and with normal blood pressure.  EKG features a sinus rhythm with nonspecific T wave abnormality.  Chest x-ray is negative for acute cardiopulmonary disease.  Chemistry panel is unremarkable, CBC is unremarkable, troponin is undetectable, BNP is normal, and d-dimer is elevated to 0.76.  CTA chest is negative for PE and negative for any acute cardiopulmonary disease.  Cardiology was consulted by the ED physician and recommended medical admission, indicating that they would evaluate the patient in the morning.  Patient remains hemodynamically stable, in no apparent respiratory distress, and will be observed on the telemetry unit for ongoing evaluation and management of exertional dyspnea with chest discomfort and nonproductive cough..  Review of Systems:  All other systems reviewed and apart from HPI, are negative.  Past Medical History:    Diagnosis Date  . Bladder infection   . Carpal tunnel syndrome   . Diabetes mellitus   . Kidney stone   . Kidney stones     Past Surgical History:  Procedure Laterality Date  . ABDOMINAL HYSTERECTOMY     partial  . APPENDECTOMY    . HERNIA REPAIR    . TONSILLECTOMY       reports that  has never smoked. she has never used smokeless tobacco. She reports that she does not drink alcohol or use drugs.  Allergies  Allergen Reactions  . Sulfa Antibiotics Itching  . Sulfonamide Derivatives Itching  . Tetanus Toxoids Swelling    Arm swelled at injection site    Family History  Problem Relation Age of Onset  . Heart attack Father 62  . Stroke Father 37  . Diabetes Father   . Diabetes Brother   . Diabetes Brother   . Breast cancer Sister 41     Prior to Admission medications   Medication Sig Start Date End Date Taking? Authorizing Provider  aspirin EC 81 MG tablet Take 81 mg by mouth daily.    Yes [provider]  cefUROXime (CEFTIN) 250 MG tablet Take 250 mg by mouth 2 (two) times daily. 10 day course started 07/31/17 am 07/31/17  Yes [provider]  Cod Liver Oil CAPS Take 1 capsule by mouth 2 (two) times a week.   Yes [provider]  Coenzyme Q10 (COQ10) 100 MG CAPS Take 100 mg by mouth daily.    Yes [provider]  ergocalciferol (VITAMIN D2) 50000 units capsule Take 50,000 Units by mouth  2 (two) times a week. Tuesdays and Fridays   Yes [provider]  fluconazole (DIFLUCAN) 100 MG tablet Take 100 mg by mouth See admin instructions. Take 1 tablet (100 mg) by mouth if needed for yeast from antibiotic use, then take 2nd tablet in 5 days. 07/31/17  Yes [provider]  hydrochlorothiazide (MICROZIDE) 12.5 MG capsule Take 12.5 mg by mouth daily as needed (excess fluid/swelling).    Yes [provider]  losartan (COZAAR) 25 MG tablet Take 25 mg by mouth See admin instructions. Take one tablet (25 mg) by mouth twice  weekly - Tuesdays and Thursdays   Yes [provider]  metFORMIN (GLUCOPHAGE) 500 MG tablet Take 500 mg by mouth 2 (two) times daily with a meal.   Yes [provider]  nystatin (MYCOSTATIN/NYSTOP) powder Apply 1 application topically 2 (two) times daily as needed (irritation/heat rash).  04/29/16  Yes [provider]  oxybutynin (DITROPAN) 5 MG tablet Take 5 mg by mouth every 8 (eight) hours as needed for bladder spasms.  07/21/17  Yes [provider]  potassium chloride SA (K-DUR,KLOR-CON) 20 MEQ tablet Take 20 mEq by mouth daily as needed (with each dose of hydrochlorothiazide).    Yes [provider]    Physical Exam: Vitals:   08/01/17 1540 08/01/17 1730 08/01/17 1745 08/01/17 1900  BP: 137/73 136/66 (!) 143/75 (!) 142/79  Pulse: 62 71 91 (!) 58  Resp: 18 (!) 5 (!) 0 18  Temp: 98.4 F (36.9 C)     TempSrc: Oral     SpO2: 99% 99% 98% 99%      Constitutional: NAD, calm, in apparent discomfort Eyes: PERTLA, lids and conjunctivae normal ENMT: Mucous membranes are moist. Posterior pharynx clear of any exudate or lesions.   Neck: normal, supple, no masses, no thyromegaly Respiratory: clear to auscultation bilaterally, no wheezing, no crackles. Normal respiratory effort.   Cardiovascular: S1 & S2 heard, regular rate and rhythm. Trace pretibial edema bilaterally. No significant JVD. Abdomen: No distension, no tenderness, no masses palpated. Bowel sounds normal.  Musculoskeletal: no clubbing / cyanosis. No joint deformity upper and lower extremities.    Skin: no significant rashes, lesions, ulcers. Warm, dry, well-perfused. Neurologic: CN 2-12 grossly intact. Sensation intact. Strength 5/5 in all 4 limbs.  Psychiatric:  Alert and oriented x 3. Calm, cooperative.     Labs on Admission: I have personally reviewed following labs and imaging studies  CBC: Recent Labs  Lab 08/01/17 1301  WBC 5.7  HGB 12.5  HCT 39.3  MCV 90.1  PLT 216    Basic Metabolic Panel: Recent Labs  Lab 08/01/17 1301  NA 142  K 4.3  CL 109  CO2 20*  GLUCOSE 106*  BUN 9  CREATININE 0.78  CALCIUM 9.3   GFR: CrCl cannot be calculated (Unknown ideal weight.). Liver Function Tests: No results for input(s): AST, ALT, ALKPHOS, BILITOT, PROT, ALBUMIN in the last 168 hours. No results for input(s): LIPASE, AMYLASE in the last 168 hours. No results for input(s): AMMONIA in the last 168 hours. Coagulation Profile: No results for input(s): INR, PROTIME in the last 168 hours. Cardiac Enzymes: No results for input(s): CKTOTAL, CKMB, CKMBINDEX, TROPONINI in the last 168 hours. BNP (last 3 results) No results for input(s): PROBNP in the last 8760 hours. HbA1C: No results for input(s): HGBA1C in the last 72 hours. CBG: No results for input(s): GLUCAP in the last 168 hours. Lipid Profile: No results for input(s): CHOL, HDL, LDLCALC, TRIG,  CHOLHDL, LDLDIRECT in the last 72 hours. Thyroid Function Tests: No results for input(s): TSH, T4TOTAL, FREET4, T3FREE, THYROIDAB in the last 72 hours. Anemia Panel: No results for input(s): VITAMINB12, FOLATE, FERRITIN, TIBC, IRON, RETICCTPCT in the last 72 hours. Urine analysis:    Component Value Date/Time   COLORURINE YELLOW 03/06/2012 0833   APPEARANCEUR CLOUDY (A) 03/06/2012 0833   LABSPEC 1.015 03/26/2014 1251   PHURINE 5.5 03/26/2014 1251   GLUCOSEU NEGATIVE 03/26/2014 1251   HGBUR MODERATE (A) 03/26/2014 1251   BILIRUBINUR NEGATIVE 03/26/2014 1251   KETONESUR NEGATIVE 03/26/2014 1251   PROTEINUR NEGATIVE 03/26/2014 1251   UROBILINOGEN 0.2 03/26/2014 1251   NITRITE NEGATIVE 03/26/2014 1251   LEUKOCYTESUR SMALL (A) 03/26/2014 1251   Sepsis Labs: @LABRCNTIP (procalcitonin:4,lacticidven:4) )No results found for this or any previous visit (from the past 240 hour(s)).   Radiological Exams on Admission: Dg Chest 2 View  Result Date: 08/01/2017 CLINICAL DATA:  Shortness of breath and cough EXAM:  CHEST  2 VIEW COMPARISON:  May 28, 2016 FINDINGS: There is no edema or consolidation. The heart size and pulmonary vascularity are normal. No adenopathy. There is degenerative change in the thoracic spine. IMPRESSION: No edema or consolidation. Electronically Signed   By: Bretta Bang III M.D.   On: 08/01/2017 13:55   Ct Angio Chest Pe W And/or Wo Contrast  Result Date: 08/01/2017 CLINICAL DATA:  Intermittent shortness of breath with positive D-dimer EXAM: CT ANGIOGRAPHY CHEST WITH CONTRAST TECHNIQUE: Multidetector CT imaging of the chest was performed using the standard protocol during bolus administration of intravenous contrast. Multiplanar CT image reconstructions and MIPs were obtained to evaluate the vascular anatomy. CONTRAST:  65 mL Isovue 370 intravenous COMPARISON:  Chest x-ray 08/01/2017, CT chest 04/01/2006 FINDINGS: Cardiovascular: Satisfactory opacification of the pulmonary arteries to the segmental level. No evidence of pulmonary embolism. Nonaneurysmal aorta. Minimal atherosclerotic calcification. Minimal coronary vessel calcification. Borderline cardiomegaly. No pericardial effusion Mediastinum/Nodes: No enlarged mediastinal, hilar, or axillary lymph nodes. Thyroid gland, trachea, and esophagus demonstrate no significant findings. Lungs/Pleura: Lungs are clear. No pleural effusion or pneumothorax. Upper Abdomen: No acute abnormality. Musculoskeletal: Kyphosis of the spine with degenerative changes. No acute or suspicious lesion Review of the MIP images confirms the above findings. IMPRESSION: Negative.  No CT evidence for acute pulmonary embolus. Aortic Atherosclerosis (ICD10-I70.0). Electronically Signed   By: Jasmine Pang M.D.   On: 08/01/2017 21:21    EKG: Independently reviewed. Sinus rhythm with non-specific T-wave abnormalities.   Assessment/Plan   1. Chest pain; DOE  - Presents with dry cough, SOB, and chest discomfort x1 week - No leukocytosis or fever and CXR is clear   - EKG with non-specific T-wave abnormalities  - Initial troponin wnl, d-dimer elevated, CTA chest neg for PE or any acute cardiopulmonary disease  - Pain is pleuritic, also worse with lying back, but no pericardial effusion or CTA and no friction rub appreciated; will check CRP and trial Toradol  - Cardiology was consulted by EDP and plans to see patient in am   - Continue cardiac monitoring, obtain serial troponin measurements, repeat EKG, and continue supportive care   2. Hypertension  - BP at goal  - Continue losartan    3. Type II DM  - A1c was 6.2% in 2017 - Managed with metformin at home, held on admission  - Follow CBG's and use SSI with Novolog as needed     DVT prophylaxis: Lovenox Code Status: Full  Family Communication: Son updated at bedside Disposition Plan:  Observe on telemetry Consults called: Cardiology Admission status: Observation    Briscoe Deutscher, MD [facility] Pager [phone]  If 7PM-7AM, please contact night-coverage [email or web address] Password [facility]  08/01/2017, 9:58 PM

## 2017-08-01 NOTE — ED Notes (Signed)
Patient transported to CT 

## 2017-08-02 DIAGNOSIS — R079 Chest pain, unspecified: Secondary | ICD-10-CM

## 2017-08-02 DIAGNOSIS — R0609 Other forms of dyspnea: Secondary | ICD-10-CM

## 2017-08-02 DIAGNOSIS — E7800 Pure hypercholesterolemia, unspecified: Secondary | ICD-10-CM | POA: Diagnosis not present

## 2017-08-02 DIAGNOSIS — R008 Other abnormalities of heart beat: Secondary | ICD-10-CM | POA: Diagnosis not present

## 2017-08-02 DIAGNOSIS — Z79899 Other long term (current) drug therapy: Secondary | ICD-10-CM | POA: Diagnosis not present

## 2017-08-02 DIAGNOSIS — M940 Chondrocostal junction syndrome [Tietze]: Secondary | ICD-10-CM | POA: Clinically undetermined

## 2017-08-02 DIAGNOSIS — I499 Cardiac arrhythmia, unspecified: Secondary | ICD-10-CM

## 2017-08-02 DIAGNOSIS — E669 Obesity, unspecified: Secondary | ICD-10-CM

## 2017-08-02 DIAGNOSIS — R0789 Other chest pain: Secondary | ICD-10-CM | POA: Diagnosis not present

## 2017-08-02 DIAGNOSIS — I493 Ventricular premature depolarization: Secondary | ICD-10-CM | POA: Diagnosis not present

## 2017-08-02 DIAGNOSIS — R002 Palpitations: Secondary | ICD-10-CM | POA: Diagnosis not present

## 2017-08-02 DIAGNOSIS — Z7982 Long term (current) use of aspirin: Secondary | ICD-10-CM | POA: Diagnosis not present

## 2017-08-02 DIAGNOSIS — I10 Essential (primary) hypertension: Secondary | ICD-10-CM | POA: Diagnosis not present

## 2017-08-02 DIAGNOSIS — Z7984 Long term (current) use of oral hypoglycemic drugs: Secondary | ICD-10-CM | POA: Diagnosis not present

## 2017-08-02 DIAGNOSIS — E1169 Type 2 diabetes mellitus with other specified complication: Secondary | ICD-10-CM | POA: Diagnosis not present

## 2017-08-02 DIAGNOSIS — E119 Type 2 diabetes mellitus without complications: Secondary | ICD-10-CM | POA: Diagnosis not present

## 2017-08-02 LAB — CBC
HEMATOCRIT: 36.4 % (ref 36.0–46.0)
Hemoglobin: 11.4 g/dL — ABNORMAL LOW (ref 12.0–15.0)
MCH: 28 pg (ref 26.0–34.0)
MCHC: 31.3 g/dL (ref 30.0–36.0)
MCV: 89.4 fL (ref 78.0–100.0)
PLATELETS: 252 10*3/uL (ref 150–400)
RBC: 4.07 MIL/uL (ref 3.87–5.11)
RDW: 14.9 % (ref 11.5–15.5)
WBC: 7 10*3/uL (ref 4.0–10.5)

## 2017-08-02 LAB — INFLUENZA PANEL BY PCR (TYPE A & B)
Influenza A By PCR: NEGATIVE
Influenza B By PCR: NEGATIVE

## 2017-08-02 LAB — BASIC METABOLIC PANEL
Anion gap: 12 (ref 5–15)
BUN: 9 mg/dL (ref 6–20)
CHLORIDE: 106 mmol/L (ref 101–111)
CO2: 23 mmol/L (ref 22–32)
CREATININE: 0.75 mg/dL (ref 0.44–1.00)
Calcium: 8.9 mg/dL (ref 8.9–10.3)
GFR calc Af Amer: >60 mL/min (ref >60)
GFR calc non Af Amer: >60 mL/min (ref >60)
GLUCOSE: 101 mg/dL — AB (ref 65–99)
POTASSIUM: 3.6 mmol/L (ref 3.5–5.1)
SODIUM: 141 mmol/L (ref 135–145)

## 2017-08-02 LAB — TROPONIN I: Troponin I: <0.03 ng/mL (ref <0.03)

## 2017-08-02 LAB — GLUCOSE, CAPILLARY
GLUCOSE-CAPILLARY: 92 mg/dL (ref 65–99)
Glucose-Capillary: 129 mg/dL — ABNORMAL HIGH (ref 65–99)
Glucose-Capillary: 115 mg/dL — ABNORMAL HIGH (ref 65–99)
Glucose-Capillary: 114 mg/dL — ABNORMAL HIGH (ref 65–99)

## 2017-08-02 NOTE — Progress Notes (Signed)
PROGRESS NOTE  [name]:[id number] DOB: [birth year] DOA: 08/01/2017 PCP: Dorothyann Peng, MD  HPI/Recap of past 24 hours:  [name] is a 72 y.o. year old female with medical history significant for HTN, T2DM who presented on 08/01/2017 with cough x 1 week with dyspnea and chest pain and was found to have costochondritis.    This morning denies any chest pain. Only pain to ribcage when pressing. Cough present but not productive  Assessment/Plan: Principal Problem:   Chest pain Active Problems:   Essential hypertension   Diabetes mellitus type 2 in obese (HCC)   DOE (dyspnea on exertion)    Atypical Chest pain, suspect costochondritis Stable currently. Given reproducibility of chest pain on exam more consistent with costochondritis likely related to recent prolonged cough(?viral) ACS ruled out given negative troponins, no ischemic changes on EKG. D-dimer slightly elevated at 0.76 but CXR and CTA negative for PNA or PE or pericardial effusion. Discussed case with Dr. Jacinto Halim, her cardiologist, who agrees this is not cardiac in etiology. Supportive care with antipyretics, and warm compresses, continue aspirin  PVCs Noted on telemetry monitoring with frequent ectopy on exam. No concerning arrhythmias on telemetry (ie no SVT, NSVR or heart block). Cardiology recommends starting low dose carvedilol due to low blood pressure and monitor for worsening bradycardia and PVC burden on telemetry  T2DM, stable without hypoglycemia Holding home metformin, follow CBGs, SSI with novolog as needed  Hypertension  Ranging 108-140/50-70. Continue to monitor with initiation of low dose carvedilol. Continue home losartan  Code Status: Full Code  Family Communication: no family at bedside  Disposition Plan: D/c on 2/10 if telemetry improves while on coreg   Consultants:  Dr. Jacinto Halim, Cardiology  Procedures:  None   Antimicrobials:  none   Cultures:  none  DVT  prophylaxis: lovenox   Objective: Vitals:   08/01/17 1900 08/01/17 2225 08/01/17 2301 08/02/17 0509  BP: (!) 142/79 140/82 (!) 151/72 (!) 108/52  Pulse: (!) 58 60 (!) 55   Resp: 18 16 17 18   Temp:   98.4 F (36.9 C) 98.2 F (36.8 C)  TempSrc:   Oral Oral  SpO2: 99% 100% 98% 96%  Weight:   101 kg (222 lb 9.6 oz) 99.9 kg (220 lb 3.2 oz)    Intake/Output Summary (Last 24 hours) at 08/02/2017 1103 Last data filed at 08/02/2017 0000 Gross per 24 hour  Intake 240 ml  Output -  Net 240 ml   Filed Weights   08/01/17 2301 08/02/17 0509  Weight: 101 kg (222 lb 9.6 oz) 99.9 kg (220 lb 3.2 oz)    Exam:  Gen. Lying in bed eating, no distress  CV. RRR, no appreciable murmurs, no edema. Chest pain reproducible on palpation of middle and left chest Resp- on room air, normal respiratory effort Gastro. Soft, ND/NT      Data Reviewed: CBC: Recent Labs  Lab 08/01/17 1301 08/02/17 0358  WBC 5.7 7.0  HGB 12.5 11.4*  HCT 39.3 36.4  MCV 90.1 89.4  PLT 216 252   Basic Metabolic Panel: Recent Labs  Lab 08/01/17 1301 08/02/17 0358  NA 142 141  K 4.3 3.6  CL 109 106  CO2 20* 23  GLUCOSE 106* 101*  BUN 9 9  CREATININE 0.78 0.75  CALCIUM 9.3 8.9   GFR: CrCl cannot be calculated (Unknown ideal weight.). Liver Function Tests: No results for input(s): AST, ALT, ALKPHOS, BILITOT, PROT, ALBUMIN in the last 168 hours. No results for input(s):  LIPASE, AMYLASE in the last 168 hours. No results for input(s): AMMONIA in the last 168 hours. Coagulation Profile: No results for input(s): INR, PROTIME in the last 168 hours. Cardiac Enzymes: Recent Labs  Lab 08/01/17 2203 08/02/17 0358  TROPONINI <0.03 <0.03   BNP (last 3 results) No results for input(s): PROBNP in the last 8760 hours. HbA1C: No results for input(s): HGBA1C in the last 72 hours. CBG: Recent Labs  Lab 08/01/17 2329 08/02/17 0754  GLUCAP 91 92   Lipid Profile: No results for input(s): CHOL, HDL, LDLCALC,  TRIG, CHOLHDL, LDLDIRECT in the last 72 hours. Thyroid Function Tests: No results for input(s): TSH, T4TOTAL, FREET4, T3FREE, THYROIDAB in the last 72 hours. Anemia Panel: No results for input(s): VITAMINB12, FOLATE, FERRITIN, TIBC, IRON, RETICCTPCT in the last 72 hours. Urine analysis:    Component Value Date/Time   COLORURINE YELLOW 03/06/2012 0833   APPEARANCEUR CLOUDY (A) 03/06/2012 0833   LABSPEC 1.015 03/26/2014 1251   PHURINE 5.5 03/26/2014 1251   GLUCOSEU NEGATIVE 03/26/2014 1251   HGBUR MODERATE (A) 03/26/2014 1251   BILIRUBINUR NEGATIVE 03/26/2014 1251   KETONESUR NEGATIVE 03/26/2014 1251   PROTEINUR NEGATIVE 03/26/2014 1251   UROBILINOGEN 0.2 03/26/2014 1251   NITRITE NEGATIVE 03/26/2014 1251   LEUKOCYTESUR SMALL (A) 03/26/2014 1251   Sepsis Labs: @LABRCNTIP (procalcitonin:4,lacticidven:4)  )No results found for this or any previous visit (from the past 240 hour(s)).    Studies: Dg Chest 2 View  Result Date: 08/01/2017 CLINICAL DATA:  Shortness of breath and cough EXAM: CHEST  2 VIEW COMPARISON:  May 28, 2016 FINDINGS: There is no edema or consolidation. The heart size and pulmonary vascularity are normal. No adenopathy. There is degenerative change in the thoracic spine. IMPRESSION: No edema or consolidation. Electronically Signed   By: Bretta BangWilliam  Woodruff III M.D.   On: 08/01/2017 13:55   Ct Angio Chest Pe W And/or Wo Contrast  Result Date: 08/01/2017 CLINICAL DATA:  Intermittent shortness of breath with positive D-dimer EXAM: CT ANGIOGRAPHY CHEST WITH CONTRAST TECHNIQUE: Multidetector CT imaging of the chest was performed using the standard protocol during bolus administration of intravenous contrast. Multiplanar CT image reconstructions and MIPs were obtained to evaluate the vascular anatomy. CONTRAST:  65 mL Isovue 370 intravenous COMPARISON:  Chest x-ray 08/01/2017, CT chest 04/01/2006 FINDINGS: Cardiovascular: Satisfactory opacification of the pulmonary arteries to  the segmental level. No evidence of pulmonary embolism. Nonaneurysmal aorta. Minimal atherosclerotic calcification. Minimal coronary vessel calcification. Borderline cardiomegaly. No pericardial effusion Mediastinum/Nodes: No enlarged mediastinal, hilar, or axillary lymph nodes. Thyroid gland, trachea, and esophagus demonstrate no significant findings. Lungs/Pleura: Lungs are clear. No pleural effusion or pneumothorax. Upper Abdomen: No acute abnormality. Musculoskeletal: Kyphosis of the spine with degenerative changes. No acute or suspicious lesion Review of the MIP images confirms the above findings. IMPRESSION: Negative.  No CT evidence for acute pulmonary embolus. Aortic Atherosclerosis (ICD10-I70.0). Electronically Signed   By: Jasmine PangKim  Fujinaga M.D.   On: 08/01/2017 21:21    Scheduled Meds: . aspirin EC  81 mg Oral Daily  . cefUROXime  250 mg Oral BID  . enoxaparin (LOVENOX) injection  40 mg Subcutaneous Q24H  . insulin aspart  0-5 Units Subcutaneous QHS  . insulin aspart  0-9 Units Subcutaneous TID WC  . [START ON 08/05/2017] losartan  25 mg Oral Once per day on Tue Thu    Continuous Infusions:   LOS: 0 days     Laverna PeaceShayla D Nettey, MD [facility] Pager [phone]  If 7PM-7AM, please contact night-coverage  [email or web address] Password [email or web address] 08/02/2017, 11:03 AM

## 2017-08-02 NOTE — Consult Note (Signed)
Reason for Consult: Palpitations and chest pain Referring Physician: Oretha Milch, MD  [name] is an 72 y.o. female.  HPI: Patient with diabetes mellitus, who was admitted with cough ongoing for the past 2 weeks, marked fatigue, shortness of breath and chest pain.  She has also been having palpitations ongoing for the past 2 weeks.  She describes cough is dry cough.  Mostly gets cough when she starts having palpitations, no fever, no chills, no hemoptysis.  Denies any leg edema.  Chest pain is described as precordial, sharp in nature, worse on taking deep breath and is located on the left upper front chest wall.  This also makes dyspnea on exertion due to pain.  No PND or orthopnea.  Past Medical History:  Diagnosis Date  . Bladder infection   . Carpal tunnel syndrome   . Diabetes mellitus   . Kidney stone   . Kidney stones     Past Surgical History:  Procedure Laterality Date  . ABDOMINAL HYSTERECTOMY     partial  . APPENDECTOMY    . HERNIA REPAIR    . TONSILLECTOMY      Family History  Problem Relation Age of Onset  . Heart attack Father 74  . Stroke Father 29  . Diabetes Father   . Diabetes Brother   . Diabetes Brother   . Breast cancer Sister 28    Social History:  reports that  has never smoked. she has never used smokeless tobacco. She reports that she does not drink alcohol or use drugs.  Allergies:  Allergies  Allergen Reactions  . Sulfa Antibiotics Itching  . Sulfonamide Derivatives Itching  . Tetanus Toxoids Swelling    Arm swelled at injection site     Results for orders placed or performed during the hospital encounter of 08/01/17 (from the past 48 hour(s))  Basic metabolic panel     Status: Abnormal   Collection Time: 08/01/17  1:01 PM  Result Value Ref Range   Sodium 142 135 - 145 mmol/L   Potassium 4.3 3.5 - 5.1 mmol/L   Chloride 109 101 - 111 mmol/L   CO2 20 (L) 22 - 32 mmol/L   Glucose, Bld 106 (H) 65 - 99 mg/dL   BUN 9 6 - 20  mg/dL   Creatinine, Ser 0.78 0.44 - 1.00 mg/dL   Calcium 9.3 8.9 - 10.3 mg/dL   GFR calc non Af Amer >60 >60 mL/min   GFR calc Af Amer >60 >60 mL/min    Comment: (NOTE) The eGFR has been calculated using the CKD EPI equation. This calculation has not been validated in all clinical situations. eGFR's persistently <60 mL/min signify possible Chronic Kidney Disease.    Anion gap 13 5 - 15    Comment: Performed at [facility] [address], [place] [address]  CBC     Status: None   Collection Time: 08/01/17  1:01 PM  Result Value Ref Range   WBC 5.7 4.0 - 10.5 K/uL   RBC 4.36 3.87 - 5.11 MIL/uL   Hemoglobin 12.5 12.0 - 15.0 g/dL   HCT 39.3 36.0 - 46.0 %   MCV 90.1 78.0 - 100.0 fL   MCH 28.7 26.0 - 34.0 pg   MCHC 31.8 30.0 - 36.0 g/dL   RDW 15.3 11.5 - 15.5 %   Platelets 216 150 - 400 K/uL    Comment: Performed at [facility] [address], [place], [place] [address]  I-stat  troponin, ED     Status: None   Collection Time: 08/01/17  1:27 PM  Result Value Ref Range   Troponin i, poc 0.00 0.00 - 0.08 ng/mL   Comment 3            Comment: Due to the release kinetics of cTnI, a negative result within the first hours of the onset of symptoms does not rule out myocardial infarction with certainty. If myocardial infarction is still suspected, repeat the test at appropriate intervals.   Brain natriuretic peptide     Status: None   Collection Time: 08/01/17  6:25 PM  Result Value Ref Range   B Natriuretic Peptide 41.5 0.0 - 100.0 pg/mL    Comment: Performed at [facility], [place] [address], [place], [place] [address]  D-dimer, quantitative (not at [facility])     Status: Abnormal   Collection Time: 08/01/17  6:25 PM  Result Value Ref Range   D-Dimer, Quant 0.76 (H) 0.00 - 0.50 ug/mL-FEU    Comment: (NOTE) At the manufacturer cut-off of 0.50 ug/mL FEU, this assay has been documented to exclude PE with a sensitivity and negative predictive value of 97 to  99%.  At this time, this assay has not been approved by the FDA to exclude DVT/VTE. Results should be correlated with clinical presentation. Performed at [facility], [place] [address] [place], [place] [address]   Troponin I     Status: None   Collection Time: 08/01/17 10:03 PM  Result Value Ref Range   Troponin I <0.03 <0.03 ng/mL    Comment: Performed at [facility] [address] [place], [place] [address]  C-reactive protein     Status: Abnormal   Collection Time: 08/01/17 10:03 PM  Result Value Ref Range   CRP 1.3 (H) <1.0 mg/dL    Comment: Performed at [facility] [address] [place], [place] [address]  Influenza panel by PCR (type A & B)     Status: None   Collection Time: 08/01/17 10:49 PM  Result Value Ref Range   Influenza A By PCR NEGATIVE NEGATIVE   Influenza B By PCR NEGATIVE NEGATIVE    Comment: (NOTE) The Xpert Xpress Flu assay is intended as an aid in the diagnosis of  influenza and should not be used as a sole basis for treatment.  This  assay is FDA approved for nasopharyngeal swab specimens only. Nasal  washings and aspirates are unacceptable for Xpert Xpress Flu testing. Performed at [facility], [place] [address] [place], [place] [address]   Glucose, capillary     Status: None   Collection Time: 08/01/17 11:29 PM  Result Value Ref Range   Glucose-Capillary 91 65 - 99 mg/dL  Troponin I     Status: None   Collection Time: 08/02/17  3:58 AM  Result Value Ref Range   Troponin I <0.03 <0.03 ng/mL    Comment: Performed at [facility] [address], [place], [place] [address]  Basic metabolic panel     Status: Abnormal   Collection Time: 08/02/17  3:58 AM  Result Value Ref Range   Sodium 141 135 - 145 mmol/L   Potassium 3.6 3.5 - 5.1 mmol/L   Chloride 106 101 - 111 mmol/L   CO2 23 22 - 32 mmol/L   Glucose, Bld 101 (H) 65 - 99 mg/dL   BUN 9 6 - 20 mg/dL   Creatinine, Ser 0.75 0.44 -  1.00 mg/dL   Calcium 8.9 8.9 -  10.3 mg/dL   GFR calc non Af Amer >60 >60 mL/min   GFR calc Af Amer >60 >60 mL/min    Comment: (NOTE) The eGFR has been calculated using the CKD EPI equation. This calculation has not been validated in all clinical situations. eGFR's persistently <60 mL/min signify possible Chronic Kidney Disease.    Anion gap 12 5 - 15    Comment: Performed at [facility] [address] [place], [place] [address]  CBC     Status: Abnormal   Collection Time: 08/02/17  3:58 AM  Result Value Ref Range   WBC 7.0 4.0 - 10.5 K/uL   RBC 4.07 3.87 - 5.11 MIL/uL   Hemoglobin 11.4 (L) 12.0 - 15.0 g/dL   HCT 36.4 36.0 - 46.0 %   MCV 89.4 78.0 - 100.0 fL   MCH 28.0 26.0 - 34.0 pg   MCHC 31.3 30.0 - 36.0 g/dL   RDW 14.9 11.5 - 15.5 %   Platelets 252 150 - 400 K/uL    Comment: Performed at [facility], [place] [address], [place], [place] [address]  Glucose, capillary     Status: None   Collection Time: 08/02/17  7:54 AM  Result Value Ref Range   Glucose-Capillary 92 65 - 99 mg/dL    Dg Chest 2 View  Result Date: 08/01/2017 CLINICAL DATA:  Shortness of breath and cough EXAM: CHEST  2 VIEW COMPARISON:  May 28, 2016 FINDINGS: There is no edema or consolidation. The heart size and pulmonary vascularity are normal. No adenopathy. There is degenerative change in the thoracic spine. IMPRESSION: No edema or consolidation. Electronically Signed   By: Lowella Grip III M.D.   On: 08/01/2017 13:55   Ct Angio Chest Pe W And/or Wo Contrast  Result Date: 08/01/2017 CLINICAL DATA:  Intermittent shortness of breath with positive D-dimer EXAM: CT ANGIOGRAPHY CHEST WITH CONTRAST TECHNIQUE: Multidetector CT imaging of the chest was performed using the standard protocol during bolus administration of intravenous contrast. Multiplanar CT image reconstructions and MIPs were obtained to evaluate the vascular anatomy. CONTRAST:  65 mL Isovue 370 intravenous COMPARISON:  Chest x-ray 08/01/2017, CT chest 04/01/2006  FINDINGS: Cardiovascular: Satisfactory opacification of the pulmonary arteries to the segmental level. No evidence of pulmonary embolism. Nonaneurysmal aorta. Minimal atherosclerotic calcification. Minimal coronary vessel calcification. Borderline cardiomegaly. No pericardial effusion Mediastinum/Nodes: No enlarged mediastinal, hilar, or axillary lymph nodes. Thyroid gland, trachea, and esophagus demonstrate no significant findings. Lungs/Pleura: Lungs are clear. No pleural effusion or pneumothorax. Upper Abdomen: No acute abnormality. Musculoskeletal: Kyphosis of the spine with degenerative changes. No acute or suspicious lesion Review of the MIP images confirms the above findings. IMPRESSION: Negative.  No CT evidence for acute pulmonary embolus. Aortic Atherosclerosis (ICD10-I70.0). Electronically Signed   By: Donavan Foil M.D.   On: 08/01/2017 21:21   Review of Systems  Constitutional: Positive for malaise/fatigue. Negative for chills and fever.  HENT: Negative.   Eyes: Negative.   Respiratory: Positive for cough and shortness of breath. Negative for hemoptysis, sputum production and wheezing.   Cardiovascular: Positive for chest pain and palpitations. Negative for orthopnea, claudication, leg swelling and PND.  Gastrointestinal: Positive for heartburn. Negative for abdominal pain, blood in stool, constipation, nausea and vomiting.  Genitourinary: Negative.   Musculoskeletal: Negative.   Skin: Negative.   Neurological: Negative.   Endo/Heme/Allergies: Negative.   Psychiatric/Behavioral: Negative.   All other systems reviewed and are negative.  Blood pressure (!) 108/52, pulse (!) 55, temperature 98.2 F (36.8 C), temperature source Oral, resp. rate 18, weight 99.9 kg (220 lb 3.2 oz), SpO2 96 %. Body mass index is 38.39 kg/m.  Physical Exam  Constitutional: She is oriented to person, place, and time. She appears well-developed and well-nourished.  Moderately obese  HENT:  Head:  Atraumatic.  Eyes: Conjunctivae are normal.  Neck: Normal range of motion. Neck supple.  Cardiovascular: Normal rate, regular rhythm, normal heart sounds and intact distal pulses. Exam reveals no gallop and no friction rub.  No murmur heard. Frequent ectopy present  Respiratory: Effort normal and breath sounds normal.  Left anterior chest wall tenderness present at the second through fifth costochondral junction.  GI: Soft. Bowel sounds are normal.  Obese and pannus present.  Musculoskeletal: Normal range of motion.  Neurological: She is alert and oriented to person, place, and time.  Skin: Skin is warm and dry.  Psychiatric: She has a normal mood and affect.   Cardiac studies:  EKG 08/02/2017: Normal sinus rhythm, sinus bradycardia at the rate of 52 bpm, frequent PVCs, 3 beat ventricular bigeminy.  Telemetry last 24 hours 08/02/2017: Normal sinus rhythm, rare ventricular couplets, PVCs occasional, occasional episodes of ventricular bigeminy. No heart block or NSVT.   Echocardiogram 05/29/2016: Normal LV size, mild LVH, normal LVEF.  Mild TR with mild pulmonary hypertension, PA pressure 37 mmHg.  Scheduled Meds: . aspirin EC  81 mg Oral Daily  . cefUROXime  250 mg Oral BID  . enoxaparin (LOVENOX) injection  40 mg Subcutaneous Q24H  . insulin aspart  0-5 Units Subcutaneous QHS  . insulin aspart  0-9 Units Subcutaneous TID WC  . [START ON 08/05/2017] losartan  25 mg Oral Once per day on Tue Thu   Continuous Infusions: PRN Meds:.acetaminophen, ALPRAZolam, ketorolac, morphine injection, nitroGLYCERIN, ondansetron (ZOFRAN) IV, oxybutynin  Assessment/Plan: 1.  Muscular skeletal chest pain, easily reproducible with left costochondral junction tenderness.  CT angiogram chest unremarkable. 2.  Shortness of breath due to musculoskeletal chest pain.  She probably also has shortness of breath due to obesity and hypoventilation. 3.  Describes palpitations that are continuous, telemetry reveals  PVCs but no SVT or NSVT or significant arrhythmias or heart block. 4.  Moderate obesity 5.  Diabetes mellitus type 2 controlled without hypoglycemia.  Patient reports that her diabetes is well controlled.  Recommendation: Patient symptoms are clearly atypical at most.  From cardiac standpoint she is stable for discharge with outpatient follow-up.  I will start the patient on carvedilol 3.125 mg twice daily in view of low blood pressure and also with underlying sinus bradycardia to see whether we can improve upon her PVCs.  We could consider outpatient Holter monitor to evaluate for PVC burden.  We can also consider outpatient cardiac workup including stress testing and echocardiogram.  Presently no clinical evidence of heart failure and I do not suspect ACS.  Thank you for the consultation.  Adrian Prows, MD 08/02/2017, 10:33 AM [facility] [place] Pager: [phone] Office: [phone] If no answer: Cell:  [phone]

## 2017-08-02 NOTE — Care Management Obs Status (Signed)
MEDICARE OBSERVATION STATUS NOTIFICATION   Patient Details  Name: Angelica KernsShirley A Meller MRN: [id number] Date of Birth: 09-Jun-1946   Medicare Observation Status Notification Given:  Yes    Leone Havenaylor, Amoy Steeves [place], RN 08/02/2017, 11:30 AM

## 2017-08-02 NOTE — Progress Notes (Signed)
Patient's droplet precaution was d/cd due to negative Influenza.

## 2017-08-02 NOTE — Plan of Care (Signed)
  Clinical Measurements: Ability to maintain clinical measurements within normal limits will improve 08/02/2017 2348 - Progressing by Luther Redourgott, Suvan Stcyr, RN   Cardiac: Ability to achieve and maintain adequate cardiovascular perfusion will improve 08/02/2017 2348 - Progressing by Luther Redourgott, Zyiah Withington, RN

## 2017-08-03 DIAGNOSIS — E1169 Type 2 diabetes mellitus with other specified complication: Secondary | ICD-10-CM | POA: Diagnosis not present

## 2017-08-03 DIAGNOSIS — I493 Ventricular premature depolarization: Secondary | ICD-10-CM

## 2017-08-03 DIAGNOSIS — I10 Essential (primary) hypertension: Secondary | ICD-10-CM | POA: Diagnosis not present

## 2017-08-03 DIAGNOSIS — M940 Chondrocostal junction syndrome [Tietze]: Secondary | ICD-10-CM | POA: Diagnosis not present

## 2017-08-03 DIAGNOSIS — R0609 Other forms of dyspnea: Secondary | ICD-10-CM | POA: Diagnosis not present

## 2017-08-03 DIAGNOSIS — I499 Cardiac arrhythmia, unspecified: Secondary | ICD-10-CM | POA: Diagnosis not present

## 2017-08-03 DIAGNOSIS — E669 Obesity, unspecified: Secondary | ICD-10-CM | POA: Diagnosis not present

## 2017-08-03 DIAGNOSIS — R079 Chest pain, unspecified: Secondary | ICD-10-CM | POA: Diagnosis not present

## 2017-08-03 LAB — GLUCOSE, CAPILLARY
GLUCOSE-CAPILLARY: 99 mg/dL (ref 65–99)
GLUCOSE-CAPILLARY: 91 mg/dL (ref 65–99)

## 2017-08-03 MED ORDER — CARVEDILOL 3.125 MG PO TABS: 3.1250 mg | ORAL_TABLET | Freq: Two times a day (BID) | ORAL | 0 refills | Status: DC

## 2017-08-03 NOTE — Discharge Summary (Addendum)
Discharge Summary  [name]:[id number] DOB: January 06, 1946  PCP: Dorothyann Peng, MD  Admit date: 08/01/2017 Discharge date: 08/03/2017  Time spent: Less than 25 minutes  Admitted From: Home Disposition: Home  Recommendations for Outpatient Follow-up:  1. Follow up with PCP/cardiologist in 1-2 weeks, follow-up.  Started carvedilol 3.125 mg twice daily    Discharge Diagnoses:  Active Hospital Problems   Diagnosis Date Noted  . Chest pain 05/28/2016  . PVC (premature ventricular contraction) 08/03/2017  . Costochondritis 08/02/2017  . DOE (dyspnea on exertion) 08/01/2017  . Diabetes mellitus type 2 in obese (HCC) 05/28/2016  . Essential hypertension 05/28/2016    Resolved Hospital Problems  No resolved problems to display.    Discharge Condition: Stable  CODE STATUS: Full code Diet recommendation: Heart Healthy  Vitals:   08/02/17 2053 08/03/17 0508  BP: 131/65 118/72  Pulse: 64   Resp: 18 16  Temp: 99.1 F (37.3 C) 98.2 F (36.8 C)  SpO2: 97% 99%    History of present illness:  [name] is a 72 y.o. year old female with medical history significant for HTN, T2DM who presented on 08/01/2017 with cough x 1 week with dyspnea and chest pain and was found to have costochondritis.    Hospital Course:  Principal Problem:   Chest pain Active Problems:   Essential hypertension   Diabetes mellitus type 2 in obese (HCC)   DOE (dyspnea on exertion)   Costochondritis   PVC (premature ventricular contraction)  Atypical chest pain, consistent with costochondritis PVCs Patient presented with chest pain associated with a nonproductive cough ongoing for 1 week at home.  On admission patient was afebrile, without leukocytosis, normal chest x-ray.  D-dimer and CRP  were slightly elevated however CTA of chest was negative for PE as well as pericardial effusion.  Given reproducibility of patient's pain (with palpation of the left rib cage) symptoms were more  consistent with costochondritis.  Receive supportive care with warm compresses and Tylenol.  Her cardiologist Dr. Jacinto Halim evaluated patient during hospitalization and agree with that assessment.  Given some PVC burden noted on telemetry and subjective palpitation patient was discharged on carvedilol at low-dose of 3.125 given heart rate range 55-60s as recommended by cardiology.  Patient was instructed to obtain close PCP or cardiology follow-up within 1 week of discharge.  On discharge day patient reported her chest pain had improved significantly.   Consultations: Cardiology,Dr. Jacinto Halim  Procedures/Studies: Dg Chest 2 View  Result Date: 08/01/2017 CLINICAL DATA:  Shortness of breath and cough EXAM: CHEST  2 VIEW COMPARISON:  May 28, 2016 FINDINGS: There is no edema or consolidation. The heart size and pulmonary vascularity are normal. No adenopathy. There is degenerative change in the thoracic spine. IMPRESSION: No edema or consolidation. Electronically Signed   By: Bretta Bang III M.D.   On: 08/01/2017 13:55   Ct Angio Chest Pe W And/or Wo Contrast  Result Date: 08/01/2017 CLINICAL DATA:  Intermittent shortness of breath with positive D-dimer EXAM: CT ANGIOGRAPHY CHEST WITH CONTRAST TECHNIQUE: Multidetector CT imaging of the chest was performed using the standard protocol during bolus administration of intravenous contrast. Multiplanar CT image reconstructions and MIPs were obtained to evaluate the vascular anatomy. CONTRAST:  65 mL Isovue 370 intravenous COMPARISON:  Chest x-ray 08/01/2017, CT chest 04/01/2006 FINDINGS: Cardiovascular: Satisfactory opacification of the pulmonary arteries to the segmental level. No evidence of pulmonary embolism. Nonaneurysmal aorta. Minimal atherosclerotic calcification. Minimal coronary vessel calcification. Borderline cardiomegaly. No pericardial effusion Mediastinum/Nodes: No  enlarged mediastinal, hilar, or axillary lymph nodes. Thyroid gland, trachea, and  esophagus demonstrate no significant findings. Lungs/Pleura: Lungs are clear. No pleural effusion or pneumothorax. Upper Abdomen: No acute abnormality. Musculoskeletal: Kyphosis of the spine with degenerative changes. No acute or suspicious lesion Review of the MIP images confirms the above findings. IMPRESSION: Negative.  No CT evidence for acute pulmonary embolus. Aortic Atherosclerosis (ICD10-I70.0). Electronically Signed   By: Jasmine Pang M.D.   On: 08/01/2017 21:21     Discharge Exam: BP 118/72 (BP Location: Right Arm)   Pulse 64   Temp 98.2 F (36.8 C) (Oral)   Resp 16   Wt 100.7 kg (221 lb 14.4 oz)   SpO2 99%   BMI 38.69 kg/m   General-lying in bed, resting comfortably, no distress Cardiovascular-regular rate and rhythm, no appreciable murmurs rubs or gallops, no peripheral edema, slight tenderness to palpation of medial rib cage Neuro- alert and oriented x4  Discharge Instructions You were cared for by a hospitalist during your hospital stay. If you have any questions about your discharge medications or the care you received while you were in the hospital after you are discharged, you can call the unit and asked to speak with the hospitalist on call if the hospitalist that took care of you is not available. Once you are discharged, your primary care physician will handle any further medical issues. Please note that NO REFILLS for any discharge medications will be authorized once you are discharged, as it is imperative that you return to your primary care physician (or establish a relationship with a primary care physician if you do not have one) for your aftercare needs so that they can reassess your need for medications and monitor your lab values.  Discharge Instructions    Diet - low sodium heart healthy   Complete by:  As directed    Increase activity slowly   Complete by:  As directed      Allergies as of 08/03/2017      Reactions   Sulfa Antibiotics Itching    Sulfonamide Derivatives Itching   Tetanus Toxoids Swelling   Arm swelled at injection site      Medication List    TAKE these medications   aspirin EC 81 MG tablet Take 81 mg by mouth daily.   carvedilol 3.125 MG tablet Commonly known as:  COREG Take 1 tablet (3.125 mg total) by mouth 2 (two) times daily.   cefUROXime 250 MG tablet Commonly known as:  CEFTIN Take 250 mg by mouth 2 (two) times daily. 10 day course started 07/31/17 am   Cod Liver Oil Caps Take 1 capsule by mouth 2 (two) times a week.   CoQ10 100 MG Caps Take 100 mg by mouth daily.   ergocalciferol 50000 units capsule Commonly known as:  VITAMIN D2 Take 50,000 Units by mouth 2 (two) times a week. Tuesdays and Fridays   fluconazole 100 MG tablet Commonly known as:  DIFLUCAN Take 100 mg by mouth See admin instructions. Take 1 tablet (100 mg) by mouth if needed for yeast from antibiotic use, then take 2nd tablet in 5 days.   hydrochlorothiazide 12.5 MG capsule Commonly known as:  MICROZIDE Take 12.5 mg by mouth daily as needed (excess fluid/swelling).   losartan 25 MG tablet Commonly known as:  COZAAR Take 25 mg by mouth See admin instructions. Take one tablet (25 mg) by mouth twice weekly - Tuesdays and Thursdays   metFORMIN 500 MG tablet Commonly known as:  GLUCOPHAGE Take 500 mg by mouth 2 (two) times daily with a meal.   nystatin powder Commonly known as:  MYCOSTATIN/NYSTOP Apply 1 application topically 2 (two) times daily as needed (irritation/heat rash).   oxybutynin 5 MG tablet Commonly known as:  DITROPAN Take 5 mg by mouth every 8 (eight) hours as needed for bladder spasms.   potassium chloride SA 20 MEQ tablet Commonly known as:  K-DUR,KLOR-CON Take 20 mEq by mouth daily as needed (with each dose of hydrochlorothiazide).      Allergies  Allergen Reactions  . Sulfa Antibiotics Itching  . Sulfonamide Derivatives Itching  . Tetanus Toxoids Swelling    Arm swelled at injection site       The results of significant diagnostics from this hospitalization (including imaging, microbiology, ancillary and laboratory) are listed below for reference.    Significant Diagnostic Studies: Dg Chest 2 View  Result Date: 08/01/2017 CLINICAL DATA:  Shortness of breath and cough EXAM: CHEST  2 VIEW COMPARISON:  May 28, 2016 FINDINGS: There is no edema or consolidation. The heart size and pulmonary vascularity are normal. No adenopathy. There is degenerative change in the thoracic spine. IMPRESSION: No edema or consolidation. Electronically Signed   By: Bretta Bang III M.D.   On: 08/01/2017 13:55   Ct Angio Chest Pe W And/or Wo Contrast  Result Date: 08/01/2017 CLINICAL DATA:  Intermittent shortness of breath with positive D-dimer EXAM: CT ANGIOGRAPHY CHEST WITH CONTRAST TECHNIQUE: Multidetector CT imaging of the chest was performed using the standard protocol during bolus administration of intravenous contrast. Multiplanar CT image reconstructions and MIPs were obtained to evaluate the vascular anatomy. CONTRAST:  65 mL Isovue 370 intravenous COMPARISON:  Chest x-ray 08/01/2017, CT chest 04/01/2006 FINDINGS: Cardiovascular: Satisfactory opacification of the pulmonary arteries to the segmental level. No evidence of pulmonary embolism. Nonaneurysmal aorta. Minimal atherosclerotic calcification. Minimal coronary vessel calcification. Borderline cardiomegaly. No pericardial effusion Mediastinum/Nodes: No enlarged mediastinal, hilar, or axillary lymph nodes. Thyroid gland, trachea, and esophagus demonstrate no significant findings. Lungs/Pleura: Lungs are clear. No pleural effusion or pneumothorax. Upper Abdomen: No acute abnormality. Musculoskeletal: Kyphosis of the spine with degenerative changes. No acute or suspicious lesion Review of the MIP images confirms the above findings. IMPRESSION: Negative.  No CT evidence for acute pulmonary embolus. Aortic Atherosclerosis (ICD10-I70.0).  Electronically Signed   By: Jasmine Pang M.D.   On: 08/01/2017 21:21    Microbiology: No results found for this or any previous visit (from the past 240 hour(s)).   Labs: Basic Metabolic Panel: Recent Labs  Lab 08/01/17 1301 08/02/17 0358  NA 142 141  K 4.3 3.6  CL 109 106  CO2 20* 23  GLUCOSE 106* 101*  BUN 9 9  CREATININE 0.78 0.75  CALCIUM 9.3 8.9   Liver Function Tests: No results for input(s): AST, ALT, ALKPHOS, BILITOT, PROT, ALBUMIN in the last 168 hours. No results for input(s): LIPASE, AMYLASE in the last 168 hours. No results for input(s): AMMONIA in the last 168 hours. CBC: Recent Labs  Lab 08/01/17 1301 08/02/17 0358  WBC 5.7 7.0  HGB 12.5 11.4*  HCT 39.3 36.4  MCV 90.1 89.4  PLT 216 252   Cardiac Enzymes: Recent Labs  Lab 08/01/17 2203 08/02/17 0358 08/02/17 0920  TROPONINI <0.03 <0.03 <0.03   BNP: BNP (last 3 results) Recent Labs    08/01/17 1825  BNP 41.5    ProBNP (last 3 results) No results for input(s): PROBNP in the last 8760 hours.  CBG: Recent Labs  Lab 08/02/17 1113 08/02/17 1648 08/02/17 2055 08/03/17 0748 08/03/17 1137  GLUCAP 129* 115* 114* 99 91       Signed:  Laverna PeaceShayla D Jahfari Ambers, MD [facility] 08/03/2017, 11:40 AM

## 2017-08-12 DIAGNOSIS — E119 Type 2 diabetes mellitus without complications: Secondary | ICD-10-CM | POA: Diagnosis not present

## 2017-08-12 DIAGNOSIS — R002 Palpitations: Secondary | ICD-10-CM | POA: Diagnosis not present

## 2017-08-12 DIAGNOSIS — R0789 Other chest pain: Secondary | ICD-10-CM | POA: Diagnosis not present

## 2017-08-18 DIAGNOSIS — R002 Palpitations: Secondary | ICD-10-CM | POA: Diagnosis not present

## 2017-08-18 DIAGNOSIS — R0602 Shortness of breath: Secondary | ICD-10-CM | POA: Diagnosis not present

## 2017-08-18 DIAGNOSIS — R0789 Other chest pain: Secondary | ICD-10-CM | POA: Diagnosis not present

## 2017-08-20 DIAGNOSIS — R002 Palpitations: Secondary | ICD-10-CM | POA: Diagnosis not present

## 2017-08-26 DIAGNOSIS — R0602 Shortness of breath: Secondary | ICD-10-CM | POA: Diagnosis not present

## 2017-08-26 DIAGNOSIS — R002 Palpitations: Secondary | ICD-10-CM | POA: Diagnosis not present

## 2017-08-26 DIAGNOSIS — R079 Chest pain, unspecified: Secondary | ICD-10-CM | POA: Diagnosis not present

## 2017-09-01 DIAGNOSIS — R0602 Shortness of breath: Secondary | ICD-10-CM | POA: Diagnosis not present

## 2017-09-01 DIAGNOSIS — G4733 Obstructive sleep apnea (adult) (pediatric): Secondary | ICD-10-CM | POA: Diagnosis not present

## 2017-09-01 DIAGNOSIS — I10 Essential (primary) hypertension: Secondary | ICD-10-CM | POA: Diagnosis not present

## 2017-09-01 DIAGNOSIS — R0600 Dyspnea, unspecified: Secondary | ICD-10-CM | POA: Diagnosis not present

## 2017-09-05 DIAGNOSIS — R002 Palpitations: Secondary | ICD-10-CM | POA: Diagnosis not present

## 2017-09-05 DIAGNOSIS — R0602 Shortness of breath: Secondary | ICD-10-CM | POA: Diagnosis not present

## 2017-09-05 DIAGNOSIS — I10 Essential (primary) hypertension: Secondary | ICD-10-CM | POA: Diagnosis not present

## 2017-09-05 DIAGNOSIS — R0789 Other chest pain: Secondary | ICD-10-CM | POA: Diagnosis not present

## 2017-09-15 DIAGNOSIS — I10 Essential (primary) hypertension: Secondary | ICD-10-CM | POA: Diagnosis not present

## 2017-09-29 DIAGNOSIS — J410 Simple chronic bronchitis: Secondary | ICD-10-CM | POA: Diagnosis not present

## 2017-09-29 DIAGNOSIS — R490 Dysphonia: Secondary | ICD-10-CM | POA: Diagnosis not present

## 2017-09-29 DIAGNOSIS — J320 Chronic maxillary sinusitis: Secondary | ICD-10-CM | POA: Diagnosis not present

## 2017-09-29 DIAGNOSIS — J040 Acute laryngitis: Secondary | ICD-10-CM | POA: Diagnosis not present

## 2017-09-29 DIAGNOSIS — R05 Cough: Secondary | ICD-10-CM | POA: Diagnosis not present

## 2017-09-29 DIAGNOSIS — J322 Chronic ethmoidal sinusitis: Secondary | ICD-10-CM | POA: Diagnosis not present

## 2017-09-29 DIAGNOSIS — K1120 Sialoadenitis, unspecified: Secondary | ICD-10-CM | POA: Diagnosis not present

## 2017-10-02 DIAGNOSIS — I10 Essential (primary) hypertension: Secondary | ICD-10-CM | POA: Diagnosis not present

## 2017-10-02 DIAGNOSIS — G4733 Obstructive sleep apnea (adult) (pediatric): Secondary | ICD-10-CM | POA: Diagnosis not present

## 2017-10-02 DIAGNOSIS — R0600 Dyspnea, unspecified: Secondary | ICD-10-CM | POA: Diagnosis not present

## 2017-10-02 DIAGNOSIS — R0602 Shortness of breath: Secondary | ICD-10-CM | POA: Diagnosis not present

## 2017-10-16 DIAGNOSIS — I10 Essential (primary) hypertension: Secondary | ICD-10-CM | POA: Diagnosis not present

## 2017-10-16 DIAGNOSIS — M179 Osteoarthritis of knee, unspecified: Secondary | ICD-10-CM | POA: Diagnosis not present

## 2017-10-16 DIAGNOSIS — E1169 Type 2 diabetes mellitus with other specified complication: Secondary | ICD-10-CM | POA: Diagnosis not present

## 2017-10-16 DIAGNOSIS — J309 Allergic rhinitis, unspecified: Secondary | ICD-10-CM | POA: Diagnosis not present

## 2017-10-29 ENCOUNTER — Other Ambulatory Visit: Payer: Self-pay | Admitting: Gastroenterology

## 2017-10-29 DIAGNOSIS — K219 Gastro-esophageal reflux disease without esophagitis: Secondary | ICD-10-CM | POA: Diagnosis not present

## 2017-10-29 DIAGNOSIS — K580 Irritable bowel syndrome with diarrhea: Secondary | ICD-10-CM | POA: Diagnosis not present

## 2017-10-29 DIAGNOSIS — R1032 Left lower quadrant pain: Secondary | ICD-10-CM | POA: Diagnosis not present

## 2017-10-29 DIAGNOSIS — K5792 Diverticulitis of intestine, part unspecified, without perforation or abscess without bleeding: Secondary | ICD-10-CM

## 2017-11-01 DIAGNOSIS — R0602 Shortness of breath: Secondary | ICD-10-CM | POA: Diagnosis not present

## 2017-11-01 DIAGNOSIS — G4733 Obstructive sleep apnea (adult) (pediatric): Secondary | ICD-10-CM | POA: Diagnosis not present

## 2017-11-01 DIAGNOSIS — I10 Essential (primary) hypertension: Secondary | ICD-10-CM | POA: Diagnosis not present

## 2017-11-01 DIAGNOSIS — R0600 Dyspnea, unspecified: Secondary | ICD-10-CM | POA: Diagnosis not present

## 2017-11-03 ENCOUNTER — Other Ambulatory Visit: Payer: Medicare HMO

## 2017-11-03 DIAGNOSIS — G4734 Idiopathic sleep related nonobstructive alveolar hypoventilation: Secondary | ICD-10-CM | POA: Diagnosis not present

## 2017-11-03 DIAGNOSIS — E119 Type 2 diabetes mellitus without complications: Secondary | ICD-10-CM | POA: Diagnosis not present

## 2017-11-03 DIAGNOSIS — I10 Essential (primary) hypertension: Secondary | ICD-10-CM | POA: Diagnosis not present

## 2017-11-03 DIAGNOSIS — R002 Palpitations: Secondary | ICD-10-CM | POA: Diagnosis not present

## 2017-11-04 ENCOUNTER — Ambulatory Visit
Admission: RE | Admit: 2017-11-04 | Discharge: 2017-11-04 | Disposition: A | Discharge status: Home / Self Care | Payer: Medicare HMO | Source: Ambulatory Visit | Attending: Gastroenterology | Admitting: Gastroenterology

## 2017-11-04 DIAGNOSIS — K5792 Diverticulitis of intestine, part unspecified, without perforation or abscess without bleeding: Secondary | ICD-10-CM

## 2017-11-04 DIAGNOSIS — R1032 Left lower quadrant pain: Secondary | ICD-10-CM

## 2017-11-04 MED ORDER — IOHEXOL 300 MG/ML  SOLN
125.0000 mL | Freq: Once | INTRAMUSCULAR | Status: AC | PRN
  Administered 2017-11-04: 125 mL via INTRAVENOUS

## 2017-11-04 NOTE — Progress Notes (Signed)
11:35 am     Post CT:  Patient c/o swelling of Right lower lip and states that her tongue feels strange. Denies respiratory difficulty  Patient seen by Dr. David Swaziland.  Will hold patient for 30 min observation post CT.    11:55 am  Stable.  Taking liquids po without difficulty.  To BR w/ithout assistance.    12:20 pm.  Seen by Dr. Swaziland.  Patient states that she is feeling better.    12:25 pm.  Discharged to home.  Marjon Doxtater Carmell Austria, RN 11/04/2017 12:30 PM

## 2017-11-10 ENCOUNTER — Other Ambulatory Visit: Payer: Self-pay | Admitting: Internal Medicine

## 2017-11-10 DIAGNOSIS — Z1231 Encounter for screening mammogram for malignant neoplasm of breast: Secondary | ICD-10-CM

## 2017-11-12 DIAGNOSIS — K5792 Diverticulitis of intestine, part unspecified, without perforation or abscess without bleeding: Secondary | ICD-10-CM | POA: Diagnosis not present

## 2017-11-24 DIAGNOSIS — I10 Essential (primary) hypertension: Secondary | ICD-10-CM | POA: Diagnosis not present

## 2017-11-24 DIAGNOSIS — R0600 Dyspnea, unspecified: Secondary | ICD-10-CM | POA: Diagnosis not present

## 2017-11-24 DIAGNOSIS — R0602 Shortness of breath: Secondary | ICD-10-CM | POA: Diagnosis not present

## 2017-11-24 DIAGNOSIS — G4733 Obstructive sleep apnea (adult) (pediatric): Secondary | ICD-10-CM | POA: Diagnosis not present

## 2017-12-02 DIAGNOSIS — G4733 Obstructive sleep apnea (adult) (pediatric): Secondary | ICD-10-CM | POA: Diagnosis not present

## 2017-12-02 DIAGNOSIS — R0600 Dyspnea, unspecified: Secondary | ICD-10-CM | POA: Diagnosis not present

## 2017-12-02 DIAGNOSIS — R0602 Shortness of breath: Secondary | ICD-10-CM | POA: Diagnosis not present

## 2017-12-02 DIAGNOSIS — I10 Essential (primary) hypertension: Secondary | ICD-10-CM | POA: Diagnosis not present

## 2017-12-05 ENCOUNTER — Ambulatory Visit: Payer: Medicare HMO

## 2017-12-05 DIAGNOSIS — Z1231 Encounter for screening mammogram for malignant neoplasm of breast: Secondary | ICD-10-CM | POA: Diagnosis not present

## 2017-12-05 DIAGNOSIS — Z803 Family history of malignant neoplasm of breast: Secondary | ICD-10-CM | POA: Diagnosis not present

## 2017-12-15 DIAGNOSIS — R69 Illness, unspecified: Secondary | ICD-10-CM | POA: Diagnosis not present

## 2017-12-22 DIAGNOSIS — R69 Illness, unspecified: Secondary | ICD-10-CM | POA: Diagnosis not present

## 2018-01-01 DIAGNOSIS — R0602 Shortness of breath: Secondary | ICD-10-CM | POA: Diagnosis not present

## 2018-01-01 DIAGNOSIS — I10 Essential (primary) hypertension: Secondary | ICD-10-CM | POA: Diagnosis not present

## 2018-01-01 DIAGNOSIS — G4733 Obstructive sleep apnea (adult) (pediatric): Secondary | ICD-10-CM | POA: Diagnosis not present

## 2018-01-01 DIAGNOSIS — R0600 Dyspnea, unspecified: Secondary | ICD-10-CM | POA: Diagnosis not present

## 2018-01-30 DIAGNOSIS — R69 Illness, unspecified: Secondary | ICD-10-CM | POA: Diagnosis not present

## 2018-02-01 DIAGNOSIS — R0602 Shortness of breath: Secondary | ICD-10-CM | POA: Diagnosis not present

## 2018-02-01 DIAGNOSIS — R0600 Dyspnea, unspecified: Secondary | ICD-10-CM | POA: Diagnosis not present

## 2018-02-01 DIAGNOSIS — G4733 Obstructive sleep apnea (adult) (pediatric): Secondary | ICD-10-CM | POA: Diagnosis not present

## 2018-02-01 DIAGNOSIS — I10 Essential (primary) hypertension: Secondary | ICD-10-CM | POA: Diagnosis not present

## 2018-02-18 DIAGNOSIS — M179 Osteoarthritis of knee, unspecified: Secondary | ICD-10-CM | POA: Diagnosis not present

## 2018-02-18 DIAGNOSIS — E785 Hyperlipidemia, unspecified: Secondary | ICD-10-CM | POA: Diagnosis not present

## 2018-02-18 DIAGNOSIS — E1169 Type 2 diabetes mellitus with other specified complication: Secondary | ICD-10-CM | POA: Diagnosis not present

## 2018-02-18 DIAGNOSIS — I10 Essential (primary) hypertension: Secondary | ICD-10-CM | POA: Diagnosis not present

## 2018-03-04 DIAGNOSIS — R0600 Dyspnea, unspecified: Secondary | ICD-10-CM | POA: Diagnosis not present

## 2018-03-04 DIAGNOSIS — R0602 Shortness of breath: Secondary | ICD-10-CM | POA: Diagnosis not present

## 2018-03-04 DIAGNOSIS — G4733 Obstructive sleep apnea (adult) (pediatric): Secondary | ICD-10-CM | POA: Diagnosis not present

## 2018-03-04 DIAGNOSIS — I10 Essential (primary) hypertension: Secondary | ICD-10-CM | POA: Diagnosis not present

## 2018-03-23 DIAGNOSIS — R69 Illness, unspecified: Secondary | ICD-10-CM | POA: Diagnosis not present

## 2018-04-03 DIAGNOSIS — R0602 Shortness of breath: Secondary | ICD-10-CM | POA: Diagnosis not present

## 2018-04-03 DIAGNOSIS — I10 Essential (primary) hypertension: Secondary | ICD-10-CM | POA: Diagnosis not present

## 2018-04-03 DIAGNOSIS — G4733 Obstructive sleep apnea (adult) (pediatric): Secondary | ICD-10-CM | POA: Diagnosis not present

## 2018-04-03 DIAGNOSIS — R0600 Dyspnea, unspecified: Secondary | ICD-10-CM | POA: Diagnosis not present

## 2018-04-05 ENCOUNTER — Encounter (HOSPITAL_COMMUNITY): Payer: Self-pay | Admitting: Emergency Medicine

## 2018-04-05 ENCOUNTER — Emergency Department (HOSPITAL_COMMUNITY): Payer: Medicare HMO

## 2018-04-05 ENCOUNTER — Emergency Department (HOSPITAL_COMMUNITY)
Admission: EM | Admit: 2018-04-05 | Discharge: 2018-04-05 | Disposition: A | Discharge status: Home / Self Care | Payer: Medicare HMO | Attending: Emergency Medicine | Admitting: Emergency Medicine

## 2018-04-05 ENCOUNTER — Other Ambulatory Visit: Payer: Self-pay

## 2018-04-05 DIAGNOSIS — Z7984 Long term (current) use of oral hypoglycemic drugs: Secondary | ICD-10-CM | POA: Insufficient documentation

## 2018-04-05 DIAGNOSIS — Z7982 Long term (current) use of aspirin: Secondary | ICD-10-CM | POA: Diagnosis not present

## 2018-04-05 DIAGNOSIS — R079 Chest pain, unspecified: Secondary | ICD-10-CM | POA: Diagnosis not present

## 2018-04-05 DIAGNOSIS — R0789 Other chest pain: Secondary | ICD-10-CM

## 2018-04-05 DIAGNOSIS — Z79899 Other long term (current) drug therapy: Secondary | ICD-10-CM | POA: Diagnosis not present

## 2018-04-05 DIAGNOSIS — I10 Essential (primary) hypertension: Secondary | ICD-10-CM | POA: Insufficient documentation

## 2018-04-05 DIAGNOSIS — E119 Type 2 diabetes mellitus without complications: Secondary | ICD-10-CM | POA: Insufficient documentation

## 2018-04-05 DIAGNOSIS — R0602 Shortness of breath: Secondary | ICD-10-CM | POA: Diagnosis not present

## 2018-04-05 LAB — COMPREHENSIVE METABOLIC PANEL
ALT: 13 U/L (ref 0–44)
AST: 21 U/L (ref 15–41)
Albumin: 3.4 g/dL — ABNORMAL LOW (ref 3.5–5.0)
Alkaline Phosphatase: 84 U/L (ref 38–126)
Anion gap: 9 (ref 5–15)
BUN: 9 mg/dL (ref 8–23)
CO2: 24 mmol/L (ref 22–32)
Calcium: 9.1 mg/dL (ref 8.9–10.3)
Chloride: 108 mmol/L (ref 98–111)
Creatinine, Ser: 0.75 mg/dL (ref 0.44–1.00)
Glucose, Bld: 120 mg/dL — ABNORMAL HIGH (ref 70–99)
POTASSIUM: 3.9 mmol/L (ref 3.5–5.1)
SODIUM: 141 mmol/L (ref 135–145)
Total Bilirubin: 0.2 mg/dL — ABNORMAL LOW (ref 0.3–1.2)
Total Protein: 6.5 g/dL (ref 6.5–8.1)

## 2018-04-05 LAB — CBC
HEMATOCRIT: 38.5 % (ref 36.0–46.0)
HEMOGLOBIN: 11.5 g/dL — AB (ref 12.0–15.0)
MCH: 27.4 pg (ref 26.0–34.0)
MCHC: 29.9 g/dL — ABNORMAL LOW (ref 30.0–36.0)
MCV: 91.9 fL (ref 80.0–100.0)
NRBC: 0 % (ref 0.0–0.2)
Platelets: 254 10*3/uL (ref 150–400)
RBC: 4.19 MIL/uL (ref 3.87–5.11)
RDW: 14.6 % (ref 11.5–15.5)
WBC: 5.5 10*3/uL (ref 4.0–10.5)

## 2018-04-05 LAB — I-STAT TROPONIN, ED
TROPONIN I, POC: 0.01 ng/mL (ref 0.00–0.08)
TROPONIN I, POC: 0 ng/mL (ref 0.00–0.08)

## 2018-04-05 LAB — LIPASE, BLOOD: LIPASE: 32 U/L (ref 11–51)

## 2018-04-05 MED ORDER — FLUTICASONE PROPIONATE 50 MCG/ACT NA SUSP: 1.0000 | Freq: Every day | NASAL | 0 refills | Status: DC

## 2018-04-05 MED ORDER — ASPIRIN 81 MG PO CHEW
324.0000 mg | CHEWABLE_TABLET | Freq: Once | ORAL | Status: AC
  Administered 2018-04-05: 324 mg via ORAL
  Filled 2018-04-05: qty 4

## 2018-04-05 MED ORDER — SUCRALFATE 1 GM/10ML PO SUSP: 1.0000 g | Freq: Three times a day (TID) | ORAL | 0 refills | Status: DC

## 2018-04-05 MED ORDER — FAMOTIDINE IN NACL 20-0.9 MG/50ML-% IV SOLN
20.0000 mg | Freq: Once | INTRAVENOUS | Status: AC
  Administered 2018-04-05: 20 mg via INTRAVENOUS
  Filled 2018-04-05: qty 50

## 2018-04-05 NOTE — ED Provider Notes (Signed)
[facility] EMERGENCY DEPARTMENT Provider Note   CSN: [id number] Arrival date & time: 04/05/18  4098     History   Chief Complaint Chief Complaint  Patient presents with  . Gastroesophageal Reflux  . Dizziness  . Weakness    HPI [name] is a 72 y.o. female presenting for evaluation of weakness, chest pain, and shortness of breath.  Patient states over the past several days, she has been having episodes of epigastric pain, weakness/tiredness, shortness of breath, and nausea.  Patient states this is more likely to happen after she eats, but is not always associated with oral intake.  She reports increased shortness of breath while lying flat and with exertion.  She has associated cough.  She reports history of similar episode in February, which resolved without intervention.  She was evaluated by cardiology at that time, told to follow-up as needed.  She has not needed to follow-up, thus has not done so.  She denies recent fevers, chills, abdominal pain, urinary symptoms, abnormal bowel movements.  She denies leg pain or swelling.  Patient with a history of diabetes, hyperlipidemia.  She denies history of CAD.  She denies tobacco or alcohol use. She reports a history of a hiatal hernia many years ago.  She has acid reflux for which she takes Protonix, has not taken it today.  She has not taken any of her medications today.  Additional history obtained from chart review.  During her last visit, patient was evaluated by cardiology.  Found to have frequent PVCs, but no signs of SVT.  It was recommended patient follow-up as needed for further evaluation outpatient including possible Holter monitor for stress echo. She was started on carvedilol, which she is still taking.   HPI  Past Medical History:  Diagnosis Date  . Bladder infection   . Carpal tunnel syndrome   . Diabetes mellitus   . Kidney stone   . Kidney stones     Patient Active Problem List   Diagnosis Date Noted  . PVC (premature ventricular contraction) 08/03/2017  . Costochondritis 08/02/2017  . DOE (dyspnea on exertion) 08/01/2017  . Chest pain 05/28/2016  . Essential hypertension 05/28/2016  . Diabetes mellitus type 2 in obese (HCC) 05/28/2016  . UPPER RESPIRATORY INFECTION, VIRAL 03/22/2009  . ALLERGIC RHINITIS CAUSE UNSPECIFIED 11/04/2008  . Hyperlipidemia 04/11/2008  . COUGH 04/11/2008  . OBSTRUCTIVE SLEEP APNEA 03/21/2008  . CARPAL TUNNEL RELEASE, BILATERAL, HX OF 03/21/2008    Past Surgical History:  Procedure Laterality Date  . ABDOMINAL HYSTERECTOMY     partial  . APPENDECTOMY    . HERNIA REPAIR    . TONSILLECTOMY       OB History   None      Home Medications    Prior to Admission medications   Medication Sig Start Date End Date Taking? Authorizing Provider  alum & mag hydroxide-simeth (MAALOX/MYLANTA) 200-200-20 MG/5ML suspension Take 15 mLs by mouth every 6 (six) hours as needed for indigestion or heartburn.   Yes [provider]  aspirin EC 81 MG tablet Take 81 mg by mouth daily.    Yes [provider]  carvedilol (COREG) 3.125 MG tablet Take 1 tablet (3.125 mg total) by mouth 2 (two) times daily. 08/03/17 04/05/18 Yes Roberto Scales D, MD  Cholecalciferol (VITAMIN D PO) Take 5,000 Units by mouth 3 (three) times a week. Monday, Wed, Friday   Yes [provider]  Coenzyme Q10 (COQ10) 100 MG CAPS Take 100 mg  by mouth daily.    Yes [provider]  hydrochlorothiazide (MICROZIDE) 12.5 MG capsule Take 12.5 mg by mouth daily as needed (excess fluid/swelling).    Yes [provider]  losartan (COZAAR) 25 MG tablet Take 25 mg by mouth See admin instructions. Take one tablet (25 mg) by mouth twice weekly - Tuesdays and Thursdays   Yes [provider]  meloxicam (MOBIC) 15 MG tablet Take 15 mg by mouth daily as needed for pain. 01/27/18  Yes [provider]  metFORMIN (GLUCOPHAGE) 500 MG tablet  Take 500 mg by mouth 2 (two) times daily with a meal.   Yes [provider]  nystatin (MYCOSTATIN/NYSTOP) powder Apply 1 application topically 2 (two) times daily as needed (irritation/heat rash).  04/29/16  Yes [provider]  oxybutynin (DITROPAN) 5 MG tablet Take 5 mg by mouth every 8 (eight) hours as needed for bladder spasms.  07/21/17  Yes [provider]  pantoprazole (PROTONIX) 40 MG tablet Take 40 mg by mouth daily.   Yes [provider]  Pitavastatin Calcium (LIVALO) 4 MG TABS Take 2 mg by mouth daily. 1/2 tablet   Yes [provider]  potassium chloride SA (K-DUR,KLOR-CON) 20 MEQ tablet Take 20 mEq by mouth daily as needed (with each dose of hydrochlorothiazide).    Yes [provider]  traMADol (ULTRAM) 50 MG tablet Take 50 mg by mouth every 6 (six) hours as needed for moderate pain.   Yes [provider]  cefUROXime (CEFTIN) 250 MG tablet Take 250 mg by mouth 2 (two) times daily. 10 day course started 07/31/17 am 07/31/17   [provider]  fluconazole (DIFLUCAN) 100 MG tablet Take 100 mg by mouth See admin instructions. Take 1 tablet (100 mg) by mouth if needed for yeast from antibiotic use, then take 2nd tablet in 5 days. 07/31/17   [provider]  fluticasone (FLONASE) 50 MCG/ACT nasal spray Place 1 spray into both nostrils daily. 04/05/18   Tremaine Fuhriman, PA-C  sucralfate (CARAFATE) 1 GM/10ML suspension Take 10 mLs (1 g total) by mouth 4 (four) times daily -  with meals and at bedtime. 04/05/18   Zelie Asbill, PA-C    Family History Family History  Problem Relation Age of Onset  . Heart attack Father 16  . Stroke Father 52  . Diabetes Father   . Diabetes Brother   . Diabetes Brother   . Breast cancer Sister 45    Social History Social History   Tobacco Use  . Smoking status: Never Smoker  . Smokeless tobacco: Never Used  Substance Use Topics  . Alcohol use: No  . Drug use: No      Allergies   Sulfa antibiotics; Sulfonamide derivatives; and Tetanus toxoids   Review of Systems Review of Systems  Respiratory: Positive for cough and shortness of breath.   Cardiovascular: Positive for chest pain.  Gastrointestinal: Positive for nausea.  All other systems reviewed and are negative.    Physical Exam Updated Vital Signs BP 131/80   Pulse (!) 43   Temp 97.7 F (36.5 C)   Resp 13   Ht 5\' 3"  (1.6 m)   Wt 103 kg   SpO2 100%   BMI 40.21 kg/m   Physical Exam  Constitutional: She is oriented to person, place, and time. She appears well-developed and well-nourished. No distress.  Elderly female who appears uncomfortable but in no acute distress  HENT:  Head: Normocephalic and atraumatic.  Eyes: Pupils are equal,  round, and reactive to light. Conjunctivae and EOM are normal.  Neck: Normal range of motion. Neck supple.  Cardiovascular: Regular rhythm and intact distal pulses.  Mildly bradycardic around 55  Pulmonary/Chest: Effort normal and breath sounds normal. No respiratory distress. She has no wheezes. She has no rales.  speaking in full sentences.  Clear lung sounds in all fields.  No rales or rhonchi noted.  Abdominal: Soft. She exhibits no distension and no mass. There is no tenderness. There is no rebound and no guarding.  No tenderness palpation of the abdomen.  Soft without rigidity, guarding, distention.  Musculoskeletal: Normal range of motion. She exhibits no edema.  No leg pain or swelling.   Neurological: She is alert and oriented to person, place, and time.  Skin: Skin is warm and dry. Capillary refill takes less than 2 seconds.  Psychiatric: She has a normal mood and affect.  Nursing note and vitals reviewed.    ED Treatments / Results  Labs (all labs ordered are listed, but only abnormal results are displayed) Labs Reviewed  CBC - Abnormal; Notable for the following components:      Result Value   Hemoglobin 11.5 (*)    MCHC 29.9  (*)    All other components within normal limits  COMPREHENSIVE METABOLIC PANEL - Abnormal; Notable for the following components:   Glucose, Bld 120 (*)    Albumin 3.4 (*)    Total Bilirubin 0.2 (*)    All other components within normal limits  LIPASE, BLOOD  I-STAT TROPONIN, ED  I-STAT TROPONIN, ED    EKG None  Radiology Dg Chest 2 View  Result Date: 04/05/2018 CLINICAL DATA:  Chest pain and shortness of breath for 3 days. EXAM: CHEST - 2 VIEW COMPARISON:  Chest x-rays dated 08/01/2017 and 05/28/2016. FINDINGS: Heart size and mediastinal contours are within normal limits. Lungs are clear. No pleural effusion or pneumothorax seen. No acute or suspicious osseous finding. Degenerative spondylitic changes again noted throughout the kyphotic thoracic spine, moderate in degree. IMPRESSION: No active cardiopulmonary disease. No evidence of pneumonia or pulmonary edema. Electronically Signed   By: Bary Richard M.D.   On: 04/05/2018 10:32    Procedures Procedures (including critical care time)  Medications Ordered in ED Medications  famotidine (PEPCID) IVPB 20 mg premix (0 mg Intravenous Stopped 04/05/18 1047)  aspirin chewable tablet 324 mg (324 mg Oral Given 04/05/18 1010)     Initial Impression / Assessment and Plan / ED Course  I have reviewed the triage vital signs and the nursing notes.  Pertinent labs & imaging results that were available during my care of the patient were reviewed by me and considered in my medical decision making (see chart for details).     Pt presenting for evaluation of chest pain, shortness of breath, generalized weakness.  Physical exam shows elderly patient in no acute distress.  However, 2 is concerning, she is having subsequent chest pain shortness of breath episodes.  Per chart review, patient had very similar symptoms several months ago, was admitted for cardiac rule out with negative findings.  Patient has not needed to follow-up with cardiology  since.  Will obtain cardiac work-up, EKG, chest x-ray and reassess.  Aspirin given for possible cardiac cause, Pepcid given for possible GI cause.  On reevaluation, patient reports symptoms are improving.  Initial troponin negative.  Labs reassuring, no leukocytosis.  Kidney, liver, pancreas and function reassuring.  EKG without STEMI.  Chest x-ray viewed interpreted by me, no  pneumonia, pneumothorax, effusion, cardiomegaly.  Doubt CHF.  Lower suspicion for ACS at this time.  Doubt PE.  Will obtain delta troponin and reassess.  Repeat troponin negative.  Case discussed with attending, Dr. Donnald Garre evaluated the patient.  On reevaluation, patient reports symptoms continue to improve.  Will have her follow closely with cardiology.  Will try Carafate and Flonase for postnasal drip and epigastric pain while further work-up is pending.  If cleared from a cardiology standpoint, patient to follow-up with her primary care doctor for further evaluation.  At this time, patient appears safe for discharge.  Return precautions given.  Patient states she understands and agrees to plan.  Final Clinical Impressions(s) / ED Diagnoses   Final diagnoses:  Atypical chest pain    ED Discharge Orders         Ordered    sucralfate (CARAFATE) 1 GM/10ML suspension  3 times daily with meals & bedtime     04/05/18 1332    fluticasone (FLONASE) 50 MCG/ACT nasal spray  Daily     04/05/18 1332           Franki Alcaide, PA-C 04/05/18 1513    Arby Barrette, MD 04/09/18 1328

## 2018-04-05 NOTE — ED Notes (Signed)
Patient transported to X-ray 

## 2018-04-05 NOTE — ED Notes (Signed)
Patient returned from X-ray 

## 2018-04-05 NOTE — ED Provider Notes (Signed)
Medical screening examination/treatment/procedure(s) were conducted as a shared visit with non-physician practitioner(s) and myself.  I personally evaluated the patient during the encounter.  None Patient has had several days of epigastric pain general tiredness with some nausea.  Most often this is been happening after she eats but not exclusively.  Patient did have cardiology evaluation in February without findings of ischemic disease.  Patient is alert and nontoxic.  Currently well in appearance.  Heart is regular no rub murmur gallop.  Lungs are clear.  Agree with plan and management.   Arby Barrette, MD 04/09/18 1327

## 2018-04-05 NOTE — Discharge Instructions (Addendum)
Use Flonase daily.  Use Carafate as needed for pain. It is very important that you follow-up with Dr. Jacinto Halim with cardiology for further evaluation of your heart. Follow-up with your primary care doctor as needed for further evaluation of your symptoms. Return to the emergency room if you develop any new, worsening, or concerning symptoms.

## 2018-04-05 NOTE — ED Triage Notes (Signed)
Pt. Stated, Im having bad indigestion and then Im getting tire and weakness and some SOB started a few days ago and seems worser.

## 2018-04-05 NOTE — ED Notes (Signed)
Patient reports improvement in discomfort.

## 2018-04-06 DIAGNOSIS — I10 Essential (primary) hypertension: Secondary | ICD-10-CM | POA: Diagnosis not present

## 2018-04-06 DIAGNOSIS — R002 Palpitations: Secondary | ICD-10-CM | POA: Diagnosis not present

## 2018-04-06 DIAGNOSIS — G4734 Idiopathic sleep related nonobstructive alveolar hypoventilation: Secondary | ICD-10-CM | POA: Diagnosis not present

## 2018-04-06 DIAGNOSIS — K219 Gastro-esophageal reflux disease without esophagitis: Secondary | ICD-10-CM | POA: Diagnosis not present

## 2018-04-13 ENCOUNTER — Telehealth: Payer: Self-pay | Admitting: Internal Medicine

## 2018-04-13 NOTE — Telephone Encounter (Signed)
I left a message letting the patient know that her 06/03/18 appointment has been changed to 06/05/18. VDM (DD)

## 2018-04-28 DIAGNOSIS — K580 Irritable bowel syndrome with diarrhea: Secondary | ICD-10-CM | POA: Diagnosis not present

## 2018-04-28 DIAGNOSIS — K5792 Diverticulitis of intestine, part unspecified, without perforation or abscess without bleeding: Secondary | ICD-10-CM | POA: Diagnosis not present

## 2018-04-28 DIAGNOSIS — R0789 Other chest pain: Secondary | ICD-10-CM | POA: Diagnosis not present

## 2018-04-28 DIAGNOSIS — K219 Gastro-esophageal reflux disease without esophagitis: Secondary | ICD-10-CM | POA: Diagnosis not present

## 2018-05-04 DIAGNOSIS — I10 Essential (primary) hypertension: Secondary | ICD-10-CM | POA: Diagnosis not present

## 2018-05-04 DIAGNOSIS — R0602 Shortness of breath: Secondary | ICD-10-CM | POA: Diagnosis not present

## 2018-05-04 DIAGNOSIS — R0600 Dyspnea, unspecified: Secondary | ICD-10-CM | POA: Diagnosis not present

## 2018-05-04 DIAGNOSIS — G4733 Obstructive sleep apnea (adult) (pediatric): Secondary | ICD-10-CM | POA: Diagnosis not present

## 2018-05-09 DIAGNOSIS — R69 Illness, unspecified: Secondary | ICD-10-CM | POA: Diagnosis not present

## 2018-05-12 ENCOUNTER — Ambulatory Visit (INDEPENDENT_AMBULATORY_CARE_PROVIDER_SITE_OTHER): Payer: Medicare HMO | Admitting: Internal Medicine

## 2018-05-12 ENCOUNTER — Encounter: Payer: Self-pay | Admitting: Internal Medicine

## 2018-05-12 VITALS — BP 110/80 | HR 75 | Temp 98.2°F | Ht 62.0 in | Wt 212.6 lb

## 2018-05-12 DIAGNOSIS — Z7982 Long term (current) use of aspirin: Secondary | ICD-10-CM

## 2018-05-12 DIAGNOSIS — R202 Paresthesia of skin: Secondary | ICD-10-CM | POA: Diagnosis not present

## 2018-05-12 DIAGNOSIS — R5383 Other fatigue: Secondary | ICD-10-CM | POA: Diagnosis not present

## 2018-05-12 DIAGNOSIS — E1169 Type 2 diabetes mellitus with other specified complication: Secondary | ICD-10-CM | POA: Diagnosis not present

## 2018-05-12 DIAGNOSIS — Z79899 Other long term (current) drug therapy: Secondary | ICD-10-CM | POA: Diagnosis not present

## 2018-05-12 DIAGNOSIS — R69 Illness, unspecified: Secondary | ICD-10-CM | POA: Diagnosis not present

## 2018-05-12 DIAGNOSIS — E1159 Type 2 diabetes mellitus with other circulatory complications: Secondary | ICD-10-CM

## 2018-05-12 DIAGNOSIS — F430 Acute stress reaction: Secondary | ICD-10-CM

## 2018-05-12 DIAGNOSIS — K219 Gastro-esophageal reflux disease without esophagitis: Secondary | ICD-10-CM

## 2018-05-12 DIAGNOSIS — D649 Anemia, unspecified: Secondary | ICD-10-CM | POA: Diagnosis not present

## 2018-05-12 DIAGNOSIS — I10 Essential (primary) hypertension: Secondary | ICD-10-CM | POA: Diagnosis not present

## 2018-05-12 DIAGNOSIS — E669 Obesity, unspecified: Secondary | ICD-10-CM

## 2018-05-12 NOTE — Progress Notes (Signed)
Subjective:     Patient ID: [name] , female    DOB: 07-23-1945 , 72 y.o.   MRN: [id number]   Chief Complaint  Patient presents with  . Fatigue    patient states she just does not feel well     HPI   SHE IS HERE TODAY FOR FURTHER EVALUATION OF FATIGUE. SHE REPORTS THAT SHE "JUST FEELS BAD". SHE IS UNABLE TO PINPOINT WHY SHE DOES NOT FEEL WELL. SHE DID HAVE ER VISIT ON 10/13 FOR FURTHER EVALUATION OF CHEST PAIN. THIS WAS NEGATIVE FOR CARDIAC EVENT. SHE FEELS THAT SHE IS ON TOO MUCH MEDICATION. REPORTS HER CARDIOLOGIST HAS CHANGED HER CARVEDILOL TO ATENOLOL. SHE IS NOT SURE IF SHE STARTED TO FEEL BAD AFTER THIS MEDICATION SWITCH. SHE HAS NOT HAD ANY FURTHER EPISODES OF CP SINCE HER ER VISIT.     Past Medical History:  Diagnosis Date  . Bladder infection   . Carpal tunnel syndrome   . Diabetes mellitus   . Kidney stone   . Kidney stones      Family History  Problem Relation Age of Onset  . Heart attack Father 39  . Stroke Father 10  . Diabetes Father   . Diabetes Brother   . Diabetes Brother   . Breast cancer Sister 82     Current Outpatient Medications:  .  aspirin EC 81 MG tablet, Take 81 mg by mouth daily. , Disp: , Rfl:  .  atenolol (TENORMIN) 50 MG tablet, Take 50 mg by mouth daily., Disp: , Rfl:  .  cetirizine (ZYRTEC) 10 MG tablet, Take 10 mg by mouth daily., Disp: , Rfl:  .  Coenzyme Q10 (COQ10) 100 MG CAPS, Take 100 mg by mouth daily. , Disp: , Rfl:  .  ergocalciferol (VITAMIN D2) 1.25 MG (50000 UT) capsule, Take 50,000 Units by mouth 2 (two) times a week., Disp: , Rfl:  .  furosemide (LASIX) 20 MG tablet, Take 20 mg by mouth daily., Disp: , Rfl:  .  hydrochlorothiazide (MICROZIDE) 12.5 MG capsule, Take 12.5 mg by mouth daily as needed (excess fluid/swelling). , Disp: , Rfl:  .  losartan (COZAAR) 25 MG tablet, Take 25 mg by mouth See admin instructions. Take one tablet (25 mg) by mouth twice weekly - Tuesdays and Thursdays, Disp: , Rfl:  .  meloxicam  (MOBIC) 15 MG tablet, Take 15 mg by mouth daily as needed for pain., Disp: , Rfl: 1 .  metFORMIN (GLUCOPHAGE) 500 MG tablet, Take 500 mg by mouth 2 (two) times daily with a meal., Disp: , Rfl:  .  metroNIDAZOLE (FLAGYL) 500 MG tablet, Take 500 mg by mouth every 8 (eight) hours., Disp: , Rfl:  .  omeprazole (PRILOSEC) 40 MG capsule, Take 40 mg by mouth daily., Disp: , Rfl:  .  pantoprazole (PROTONIX) 40 MG tablet, Take 40 mg by mouth daily., Disp: , Rfl:  .  Pitavastatin Calcium (LIVALO) 4 MG TABS, Take 2 mg by mouth daily. 1/2 tablet, Disp: , Rfl:  .  potassium chloride SA (K-DUR,KLOR-CON) 20 MEQ tablet, Take 20 mEq by mouth daily as needed (with each dose of hydrochlorothiazide). , Disp: , Rfl:  .  traMADol (ULTRAM) 50 MG tablet, Take 50 mg by mouth every 6 (six) hours as needed for moderate pain., Disp: , Rfl:    Allergies  Allergen Reactions  . Sulfa Antibiotics Itching  . Sulfonamide Derivatives Itching  . Tetanus Toxoids Swelling    Arm swelled at injection site  Review of Systems  Constitutional: Positive for fatigue.  Cardiovascular: Negative.   Gastrointestinal: Negative.   Neurological: Negative.   Psychiatric/Behavioral: Negative.      Today's Vitals   05/12/18 1118  BP: 110/80  Pulse: 75  Temp: 98.2 F (36.8 C)  TempSrc: Oral  SpO2: 95%  Weight: 212 lb 9.6 oz (96.4 kg)  Height: '5\' 2"'  (1.575 m)  PainSc: 0-No pain   Body mass index is 38.89 kg/m.   Objective:  Physical Exam  Constitutional: She is oriented to person, place, and time. She appears well-developed and well-nourished.  HENT:  Head: Normocephalic and atraumatic.  Eyes: EOM are normal.  Cardiovascular: Normal rate, regular rhythm and normal heart sounds.  Pulmonary/Chest: Effort normal and breath sounds normal.  Neurological: She is alert and oriented to person, place, and time.  Nursing note and vitals reviewed.       Assessment And Plan:     Fatigue, unspecified type - PT ADVISED SHE IS  AT RISK FOR B12 DEFICIENCY DUE TO LONG-TERM USE OF PPI. THYROID FXN NL AUG 2019.  - Plan: Vitamin B12  Paresthesia of both hands - INTERMITTENT, UNABLE TO DETERMINE TRIGGERS. AGAIN, I WILL CHECK VIT B12 LEVEL TODAY.   Gastroesophageal reflux disease without esophagitis - CHRONIC. REMINDED TO AVOID GOING TO BED LESS THAN THREE HOURS AFTER EATING. SHE WILL C/W CURRENT MEDS FOR NOW.   Obesity, diabetes, and hypertension syndrome ([place]) - I WILL CHECK HBA1C TODAY. I WILL CONSIDER WEANING HER OFF OF METFORMIN. SHE IS ADVISED OF INCREASED RISK OF VIT B12 DEF. W/ LONG-TERM USE.  - Plan: Hemoglobin A1c, BMP8+EGFR, Lipid Profile  Stress reaction, acute - SHE IS ENCOURAGED TO GO TO GRIEF COUNSELING. I THINK THIS COULD BE A MAJOR FACTOR IN HOW SHE IS FEELING.  SHE REPORTS HAVING COMMUNITY SUPPORT. PT ADVISED THAT MOST PATIENTS DO NOT WANT TO GO TO GRIEF COUNSELING, BUT THEY FEEL SO MUCH BETTER AFTER THEY ATTEND AT LEAST ONE SESSION. SHE LOST HER HUSBAND EARLIER THIS YEAR AND SHE WAS HIS PRIMARY CAREGIVER. I WILL REVISIT AT HER VISIT NEXT MONTH.   [name], MD

## 2018-05-12 NOTE — Patient Instructions (Signed)
Food Choices for Gastroesophageal Reflux Disease, Adult When you have gastroesophageal reflux disease (GERD), the foods you eat and your eating habits are very important. Choosing the right foods can help ease your discomfort. What guidelines do I need to follow?  Choose fruits, vegetables, whole grains, and low-fat dairy products.  Choose low-fat meat, fish, and poultry.  Limit fats such as oils, salad dressings, butter, nuts, and avocado.  Keep a food diary. This helps you identify foods that cause symptoms.  Avoid foods that cause symptoms. These may be different for everyone.  Eat small meals often instead of 3 large meals a day.  Eat your meals slowly, in a place where you are relaxed.  Limit fried foods.  Cook foods using methods other than frying.  Avoid drinking alcohol.  Avoid drinking large amounts of liquids with your meals.  Avoid bending over or lying down until 2-3 hours after eating. What foods are not recommended? These are some foods and drinks that may make your symptoms worse: Vegetables  Tomatoes. Tomato juice. Tomato and spaghetti sauce. Chili peppers. Onion and garlic. Horseradish. Fruits  Oranges, grapefruit, and lemon (fruit and juice). Meats  High-fat meats, fish, and poultry. This includes hot dogs, ribs, ham, sausage, salami, and bacon. Dairy  Whole milk and chocolate milk. Sour cream. Cream. Butter. Ice cream. Cream cheese. Drinks  Coffee and tea. Bubbly (carbonated) drinks or energy drinks. Condiments  Hot sauce. Barbecue sauce. Sweets/Desserts  Chocolate and cocoa. Donuts. Peppermint and spearmint. Fats and Oils  High-fat foods. This includes French fries and potato chips. Other  Vinegar. Strong spices. This includes black pepper, white pepper, red pepper, cayenne, curry powder, cloves, ginger, and chili powder. The items listed above may not be a complete list of foods and drinks to avoid. Contact your dietitian for more information.    This information is not intended to replace advice given to you by your health care provider. Make sure you discuss any questions you have with your health care provider. Document Released: 12/10/2011 Document Revised: 11/16/2015 Document Reviewed: 04/14/2013 Elsevier Interactive Patient Education  2017 [employer]  

## 2018-05-13 LAB — LIPID PANEL
Chol/HDL Ratio: 2 ratio (ref 0.0–4.4)
Cholesterol, Total: 166 mg/dL (ref 100–199)
HDL: 84 mg/dL (ref >39)
LDL Calculated: 72 mg/dL (ref 0–99)
Triglycerides: 50 mg/dL (ref 0–149)
VLDL Cholesterol Cal: 10 mg/dL (ref 5–40)

## 2018-05-13 LAB — HEMOGLOBIN A1C
ESTIMATED AVERAGE GLUCOSE: 126 mg/dL
HEMOGLOBIN A1C: 6 % — AB (ref 4.8–5.6)

## 2018-05-13 LAB — BMP8+EGFR
BUN / CREAT RATIO: 12 (ref 12–28)
BUN: 9 mg/dL (ref 8–27)
CHLORIDE: 102 mmol/L (ref 96–106)
CO2: 23 mmol/L (ref 20–29)
Calcium: 9.5 mg/dL (ref 8.7–10.3)
Creatinine, Ser: 0.75 mg/dL (ref 0.57–1.00)
GFR calc non Af Amer: 79 mL/min/{1.73_m2} (ref >59)
GFR, EST AFRICAN AMERICAN: 91 mL/min/{1.73_m2} (ref >59)
GLUCOSE: 101 mg/dL — AB (ref 65–99)
POTASSIUM: 4.1 mmol/L (ref 3.5–5.2)
SODIUM: 142 mmol/L (ref 134–144)

## 2018-05-13 LAB — VITAMIN B12: Vitamin B-12: 1047 pg/mL (ref 232–1245)

## 2018-05-15 NOTE — Progress Notes (Signed)
Here are your lab results;  Your hba1c is 6.0, this is improved from your last visit.  Your kidney function is normal. Your cholesterol looks great. Please continue with current meds. Lastly, your vitamin B12 level is normal. Unfortunately, I have not found a reason for your fatigue. There is a chance that your symptoms could be related to your mood.   Please share what your thoughts are on this? I would like to start you on a medication that may help to improve your energy levels while also improving your mood. How do you feel about this? I look forward to hearing from you.   Sincerely,    Tesslyn Baumert N. Allyne GeeSanders, MD

## 2018-05-28 ENCOUNTER — Telehealth: Payer: Self-pay

## 2018-05-28 DIAGNOSIS — Z1272 Encounter for screening for malignant neoplasm of vagina: Secondary | ICD-10-CM | POA: Diagnosis not present

## 2018-05-28 DIAGNOSIS — Z01419 Encounter for gynecological examination (general) (routine) without abnormal findings: Secondary | ICD-10-CM | POA: Diagnosis not present

## 2018-05-28 NOTE — Telephone Encounter (Signed)
pls send zoloft 25mg  at dinner. F/u with me in four weeks. #30/1 refill

## 2018-05-28 NOTE — Telephone Encounter (Signed)
The patient was given her lab results and said that she is willing to take something for her mood.

## 2018-05-29 ENCOUNTER — Other Ambulatory Visit: Payer: Self-pay

## 2018-05-29 MED ORDER — SERTRALINE HCL 25 MG PO TABS: 25.0000 mg | ORAL_TABLET | Freq: Every day | ORAL | 0 refills | Status: DC

## 2018-06-03 ENCOUNTER — Ambulatory Visit: Payer: Medicare Other | Admitting: Internal Medicine

## 2018-06-03 ENCOUNTER — Ambulatory Visit: Payer: Medicare Other

## 2018-06-03 ENCOUNTER — Ambulatory Visit: Payer: Medicare HMO | Admitting: Internal Medicine

## 2018-06-03 DIAGNOSIS — I10 Essential (primary) hypertension: Secondary | ICD-10-CM | POA: Diagnosis not present

## 2018-06-03 DIAGNOSIS — R0600 Dyspnea, unspecified: Secondary | ICD-10-CM | POA: Diagnosis not present

## 2018-06-03 DIAGNOSIS — R0602 Shortness of breath: Secondary | ICD-10-CM | POA: Diagnosis not present

## 2018-06-03 DIAGNOSIS — G4733 Obstructive sleep apnea (adult) (pediatric): Secondary | ICD-10-CM | POA: Diagnosis not present

## 2018-06-04 ENCOUNTER — Encounter: Payer: Self-pay | Admitting: Internal Medicine

## 2018-06-04 ENCOUNTER — Ambulatory Visit: Payer: Medicare HMO | Admitting: Internal Medicine

## 2018-06-04 ENCOUNTER — Ambulatory Visit (INDEPENDENT_AMBULATORY_CARE_PROVIDER_SITE_OTHER): Payer: Medicare HMO

## 2018-06-04 ENCOUNTER — Ambulatory Visit (INDEPENDENT_AMBULATORY_CARE_PROVIDER_SITE_OTHER): Payer: Medicare HMO | Admitting: Internal Medicine

## 2018-06-04 VITALS — BP 110/74 | HR 50 | Temp 97.7°F | Ht 62.5 in | Wt 213.6 lb

## 2018-06-04 DIAGNOSIS — I10 Essential (primary) hypertension: Secondary | ICD-10-CM

## 2018-06-04 DIAGNOSIS — E1159 Type 2 diabetes mellitus with other circulatory complications: Secondary | ICD-10-CM | POA: Diagnosis not present

## 2018-06-04 DIAGNOSIS — E1169 Type 2 diabetes mellitus with other specified complication: Secondary | ICD-10-CM

## 2018-06-04 DIAGNOSIS — Z0000 Encounter for general adult medical examination without abnormal findings: Secondary | ICD-10-CM | POA: Diagnosis not present

## 2018-06-04 DIAGNOSIS — H10413 Chronic giant papillary conjunctivitis, bilateral: Secondary | ICD-10-CM | POA: Diagnosis not present

## 2018-06-04 DIAGNOSIS — H04123 Dry eye syndrome of bilateral lacrimal glands: Secondary | ICD-10-CM | POA: Diagnosis not present

## 2018-06-04 DIAGNOSIS — E669 Obesity, unspecified: Secondary | ICD-10-CM | POA: Diagnosis not present

## 2018-06-04 DIAGNOSIS — R001 Bradycardia, unspecified: Secondary | ICD-10-CM

## 2018-06-04 DIAGNOSIS — H25813 Combined forms of age-related cataract, bilateral: Secondary | ICD-10-CM | POA: Diagnosis not present

## 2018-06-04 DIAGNOSIS — E119 Type 2 diabetes mellitus without complications: Secondary | ICD-10-CM | POA: Diagnosis not present

## 2018-06-04 DIAGNOSIS — R5382 Chronic fatigue, unspecified: Secondary | ICD-10-CM

## 2018-06-04 DIAGNOSIS — H353131 Nonexudative age-related macular degeneration, bilateral, early dry stage: Secondary | ICD-10-CM | POA: Diagnosis not present

## 2018-06-04 LAB — HM DIABETES EYE EXAM

## 2018-06-04 NOTE — Patient Instructions (Signed)

## 2018-06-04 NOTE — Patient Instructions (Signed)
[name] , Thank you for taking time to come for your Medicare Wellness Visit. I appreciate your ongoing commitment to your health goals. Please review the following plan we discussed and let me know if I can assist you in the future.   Screening recommendations/referrals: Colonoscopy: 04/2015 Mammogram: 11/2017 Bone Density: 09/2015 Recommended yearly ophthalmology/optometry visit for glaucoma screening and checkup Recommended yearly dental visit for hygiene and checkup  Vaccinations: Influenza vaccine: declined Pneumococcal vaccine: declined Tdap vaccine: declined Shingles vaccine: declined    Advanced directives: Please bring a copy of your POA (Power of Attorney) and/or Living Will to your next appointment.    Conditions/risks identified: Obesity  Next appointment: 2020   Preventive Care 65 Years and Older, Female Preventive care refers to lifestyle choices and visits with your health care provider that can promote health and wellness. What does preventive care include?  A yearly physical exam. This is also called an annual well check.  Dental exams once or twice a year.  Routine eye exams. Ask your health care provider how often you should have your eyes checked.  Personal lifestyle choices, including:  Daily care of your teeth and gums.  Regular physical activity.  Eating a healthy diet.  Avoiding tobacco and drug use.  Limiting alcohol use.  Practicing safe sex.  Taking low-dose aspirin every day.  Taking vitamin and mineral supplements as recommended by your health care provider. What happens during an annual well check? The services and screenings done by your health care provider during your annual well check will depend on your age, overall health, lifestyle risk factors, and family history of disease. Counseling  Your health care provider may ask you questions about your:  Alcohol use.  Tobacco use.  Drug use.  Emotional well-being.  Home  and relationship well-being.  Sexual activity.  Eating habits.  History of falls.  Memory and ability to understand (cognition).  Work and work Astronomerenvironment.  Reproductive health. Screening  You may have the following tests or measurements:  Height, weight, and BMI.  Blood pressure.  Lipid and cholesterol levels. These may be checked every 5 years, or more frequently if you are over 73 years old.  Skin check.  Lung cancer screening. You may have this screening every year starting at age 255 if you have a 30-pack-year history of smoking and currently smoke or have quit within the past 15 years.  Fecal occult blood test (FOBT) of the stool. You may have this test every year starting at age 73.  Flexible sigmoidoscopy or colonoscopy. You may have a sigmoidoscopy every 5 years or a colonoscopy every 10 years starting at age 73.  Hepatitis C blood test.  Hepatitis B blood test.  Sexually transmitted disease (STD) testing.  Diabetes screening. This is done by checking your blood sugar (glucose) after you have not eaten for a while (fasting). You may have this done every 1-3 years.  Bone density scan. This is done to screen for osteoporosis. You may have this done starting at age 73.  Mammogram. This may be done every 1-2 years. Talk to your health care provider about how often you should have regular mammograms. Talk with your health care provider about your test results, treatment options, and if necessary, the need for more tests. Vaccines  Your health care provider may recommend certain vaccines, such as:  Influenza vaccine. This is recommended every year.  Tetanus, diphtheria, and acellular pertussis (Tdap, Td) vaccine. You may need a Td booster every 10  years.  Zoster vaccine. You may need this after age 11.  Pneumococcal 13-valent conjugate (PCV13) vaccine. One dose is recommended after age 72.  Pneumococcal polysaccharide (PPSV23) vaccine. One dose is recommended  after age 71. Talk to your health care provider about which screenings and vaccines you need and how often you need them. This information is not intended to replace advice given to you by your health care provider. Make sure you discuss any questions you have with your health care provider. Document Released: 07/07/2015 Document Revised: 02/28/2016 Document Reviewed: 04/11/2015 Elsevier Interactive Patient Education  2017 [place] Prevention in the Home Falls can cause injuries. They can happen to people of all ages. There are many things you can do to make your home safe and to help prevent falls. What can I do on the outside of my home?  Regularly fix the edges of walkways and driveways and fix any cracks.  Remove anything that might make you trip as you walk through a door, such as a raised step or threshold.  Trim any bushes or trees on the path to your home.  Use bright outdoor lighting.  Clear any walking paths of anything that might make someone trip, such as rocks or tools.  Regularly check to see if handrails are loose or broken. Make sure that both sides of any steps have handrails.  Any raised decks and porches should have guardrails on the edges.  Have any leaves, snow, or ice cleared regularly.  Use sand or salt on walking paths during winter.  Clean up any spills in your garage right away. This includes oil or grease spills. What can I do in the bathroom?  Use night lights.  Install grab bars by the toilet and in the tub and shower. Do not use towel bars as grab bars.  Use non-skid mats or decals in the tub or shower.  If you need to sit down in the shower, use a plastic, non-slip stool.  Keep the floor dry. Clean up any water that spills on the floor as soon as it happens.  Remove soap buildup in the tub or shower regularly.  Attach bath mats securely with double-sided non-slip rug tape.  Do not have throw rugs and other things on the floor  that can make you trip. What can I do in the bedroom?  Use night lights.  Make sure that you have a light by your bed that is easy to reach.  Do not use any sheets or blankets that are too big for your bed. They should not hang down onto the floor.  Have a firm chair that has side arms. You can use this for support while you get dressed.  Do not have throw rugs and other things on the floor that can make you trip. What can I do in the kitchen?  Clean up any spills right away.  Avoid walking on wet floors.  Keep items that you use a lot in easy-to-reach places.  If you need to reach something above you, use a strong step stool that has a grab bar.  Keep electrical cords out of the way.  Do not use floor polish or wax that makes floors slippery. If you must use wax, use non-skid floor wax.  Do not have throw rugs and other things on the floor that can make you trip. What can I do with my stairs?  Do not leave any items on the stairs.  Make sure that  there are handrails on both sides of the stairs and use them. Fix handrails that are broken or loose. Make sure that handrails are as long as the stairways.  Check any carpeting to make sure that it is firmly attached to the stairs. Fix any carpet that is loose or worn.  Avoid having throw rugs at the top or bottom of the stairs. If you do have throw rugs, attach them to the floor with carpet tape.  Make sure that you have a light switch at the top of the stairs and the bottom of the stairs. If you do not have them, ask someone to add them for you. What else can I do to help prevent falls?  Wear shoes that:  Do not have high heels.  Have rubber bottoms.  Are comfortable and fit you well.  Are closed at the toe. Do not wear sandals.  If you use a stepladder:  Make sure that it is fully opened. Do not climb a closed stepladder.  Make sure that both sides of the stepladder are locked into place.  Ask someone to hold it  for you, if possible.  Clearly mark and make sure that you can see:  Any grab bars or handrails.  First and last steps.  Where the edge of each step is.  Use tools that help you move around (mobility aids) if they are needed. These include:  Canes.  Walkers.  Scooters.  Crutches.  Turn on the lights when you go into a dark area. Replace any light bulbs as soon as they burn out.  Set up your furniture so you have a clear path. Avoid moving your furniture around.  If any of your floors are uneven, fix them.  If there are any pets around you, be aware of where they are.  Review your medicines with your doctor. Some medicines can make you feel dizzy. This can increase your chance of falling. Ask your doctor what other things that you can do to help prevent falls. This information is not intended to replace advice given to you by your health care provider. Make sure you discuss any questions you have with your health care provider. Document Released: 04/06/2009 Document Revised: 11/16/2015 Document Reviewed: 07/15/2014 Elsevier Interactive Patient Education  2017 [employer].

## 2018-06-04 NOTE — Progress Notes (Signed)
Subjective:   [name] is a 72 y.o. female who presents for Medicare Annual (Subsequent) preventive examination.  Review of Systems:  n/a Cardiac Risk Factors include: diabetes mellitus;dyslipidemia;obesity (BMI >30kg/m2);sedentary lifestyle     Objective:     Vitals: BP 110/74 (BP Location: Left Arm, Patient Position: Sitting)   Pulse (!) 50   Temp 97.7 F (36.5 C) (Oral)   Ht 5' 2.5" (1.588 m)   Wt 213 lb 9.6 oz (96.9 kg)   BMI 38.45 kg/m   Body mass index is 38.45 kg/m.  Advanced Directives 06/04/2018 08/01/2017 05/28/2016 02/03/2016  Does Patient Have a Medical Advance Directive? Yes Yes No No  Type of Estate agent of [place];Living will - - -  Does patient want to make changes to medical advance directive? No - Patient declined - - -  Copy of Healthcare Power of Attorney in Chart? No - copy requested - - -  Would patient like information on creating a medical advance directive? - - No - Patient declined Yes - Transport planner given    Tobacco Social History   Tobacco Use  Smoking Status Never Smoker  Smokeless Tobacco Never Used     Counseling given: Not Answered   Clinical Intake:  Pre-visit preparation completed: Yes  Pain : No/denies pain Pain Score: 0-No pain     Nutritional Status: BMI 25 -29 Overweight Nutritional Risks: None Diabetes: Yes CBG done?: No Did pt. bring in CBG monitor from home?: No  How often do you need to have someone help you when you read instructions, pamphlets, or other written materials from your doctor or pharmacy?: 1 - Never What is the last grade level you completed in school?: some college  Interpreter Needed?: No  Information entered by :: NAlleN LPN  Past Medical History:  Diagnosis Date  . Bladder infection   . Carpal tunnel syndrome   . Diabetes mellitus   . Kidney stone   . Kidney stones    Past Surgical History:  Procedure Laterality Date  . ABDOMINAL HYSTERECTOMY     partial  . APPENDECTOMY    . HERNIA REPAIR    . TONSILLECTOMY     Family History  Problem Relation Age of Onset  . Heart attack Father 68  . Stroke Father 3  . Diabetes Father   . Diabetes Brother   . Diabetes Brother   . Breast cancer Sister 58  . Diabetes Mother   . Hypertension Mother    Social History   Socioeconomic History  . Marital status: Widowed    Spouse name: Not on file  . Number of children: Not on file  . Years of education: Not on file  . Highest education level: Not on file  Occupational History  . Not on file  Social Needs  . Financial resource strain: Not on file  . Food insecurity:    Worry: Not on file    Inability: Not on file  . Transportation needs:    Medical: Not on file    Non-medical: Not on file  Tobacco Use  . Smoking status: Never Smoker  . Smokeless tobacco: Never Used  Substance and Sexual Activity  . Alcohol use: No  . Drug use: No  . Sexual activity: Never  Lifestyle  . Physical activity:    Days per week: Not on file    Minutes per session: Not on file  . Stress: Not on file  Relationships  . Social connections:  Talks on phone: Not on file    Gets together: Not on file    Attends religious service: Not on file    Active member of club or organization: Not on file    Attends meetings of clubs or organizations: Not on file    Relationship status: Not on file  Other Topics Concern  . Not on file  Social History Narrative  . Not on file    Outpatient Encounter Medications as of 06/04/2018  Medication Sig  . aspirin EC 81 MG tablet Take 81 mg by mouth daily.   Marland Kitchen atenolol (TENORMIN) 50 MG tablet Take 50 mg by mouth daily.  . cetirizine (ZYRTEC) 10 MG tablet Take 10 mg by mouth daily.  . Coenzyme Q10 (COQ10) 100 MG CAPS Take 100 mg by mouth daily.   . ergocalciferol (VITAMIN D2) 1.25 MG (50000 UT) capsule Take 50,000 Units by mouth 2 (two) times a week.  . furosemide (LASIX) 20 MG tablet Take 20 mg by mouth daily.  .  hydrochlorothiazide (MICROZIDE) 12.5 MG capsule Take 12.5 mg by mouth daily as needed (excess fluid/swelling).   Marland Kitchen losartan (COZAAR) 25 MG tablet Take 25 mg by mouth See admin instructions. Take one tablet (25 mg) by mouth twice weekly - Tuesdays and Thursdays  . meloxicam (MOBIC) 15 MG tablet Take 15 mg by mouth daily as needed for pain.  . metFORMIN (GLUCOPHAGE) 500 MG tablet Take 500 mg by mouth 2 (two) times daily with a meal.  . metroNIDAZOLE (FLAGYL) 500 MG tablet Take 500 mg by mouth every 8 (eight) hours.  Marland Kitchen omeprazole (PRILOSEC) 40 MG capsule Take 40 mg by mouth daily.  . pantoprazole (PROTONIX) 40 MG tablet Take 40 mg by mouth daily.  . Pitavastatin Calcium (LIVALO) 4 MG TABS Take 2 mg by mouth daily. 1/2 tablet  . potassium chloride SA (K-DUR,KLOR-CON) 20 MEQ tablet Take 20 mEq by mouth daily as needed (with each dose of hydrochlorothiazide).   . sertraline (ZOLOFT) 25 MG tablet Take 1 tablet (25 mg total) by mouth daily.  . traMADol (ULTRAM) 50 MG tablet Take 50 mg by mouth every 6 (six) hours as needed for moderate pain.   No facility-administered encounter medications on file as of 06/04/2018.     Activities of Daily Living In your present state of health, do you have any difficulty performing the following activities: 06/04/2018 08/02/2017  Hearing? N N  Vision? N N  Difficulty concentrating or making decisions? N N  Walking or climbing stairs? N Y  Comment - due to knees problem  Dressing or bathing? N N  Doing errands, shopping? N N  Preparing Food and eating ? N -  Using the Toilet? N -  In the past six months, have you accidently leaked urine? N -  Do you have problems with loss of bowel control? N -  Managing your Medications? N -  Managing your Finances? N -  Housekeeping or managing your Housekeeping? N -  Some recent data might be hidden    Patient Care Team: Dorothyann Peng, MD as PCP - General (Internal Medicine)    Assessment:   This is a routine wellness  examination for [name].  Exercise Activities and Dietary recommendations Current Exercise Habits: The patient does not participate in regular exercise at present, Exercise limited by: None identified  Goals    . DIET - INCREASE WATER INTAKE (pt-stated)    . Exercise 150 min/wk Moderate Activity (pt-stated)  Fall Risk Fall Risk  06/04/2018 06/04/2018 05/12/2018  Falls in the past year? 0 0 0  Risk for fall due to : Medication side effect - -   Is the patient's home free of loose throw rugs in walkways, pet beds, electrical cords, etc?   yes      Grab bars in the bathroom? yes      Handrails on the stairs?   yes      Adequate lighting?   yes  Timed Get Up and Go performed: n/a  Depression Screen PHQ 2/9 Scores 06/04/2018 06/04/2018 05/12/2018  PHQ - 2 Score 0 0 0     Cognitive Function     6CIT Screen 06/04/2018  What Year? 0 points  What month? 0 points  What time? 3 points  Count back from 20 0 points  Months in reverse 2 points  Repeat phrase 2 points  Total Score 7     There is no immunization history on file for this patient.  Qualifies for Shingles Vaccine? yes  Screening Tests Health Maintenance  Topic Date Due  . INFLUENZA VACCINE  03/26/2019 (Originally 01/22/2018)  . TETANUS/TDAP  06/05/2019 (Originally 11/16/1963)  . PNA vac Low Risk Adult (1 of 2 - PCV13) 06/05/2019 (Originally 11/15/2009)  . HEMOGLOBIN A1C  11/10/2018  . FOOT EXAM  06/05/2019  . OPHTHALMOLOGY EXAM  06/05/2019  . MAMMOGRAM  12/06/2019  . COLONOSCOPY  05/14/2025  . DEXA SCAN  Completed  . Hepatitis C Screening  Completed    Cancer Screenings: Lung: Low Dose CT Chest recommended if Age 60-80 years, 30 pack-year currently smoking OR have quit w/in 15years. Patient does not qualify. Breast:  Up to date on Mammogram? Yes   Up to date of Bone Density/Dexa? Yes Colorectal: up to date  Additional Screenings: : Hepatitis C Screening: due     Plan:    Does not get vaccinations    I have personally reviewed and noted the following in the patient's chart:   . Medical and social history . Use of alcohol, tobacco or illicit drugs  . Current medications and supplements . Functional ability and status . Nutritional status . Physical activity . Advanced directives . List of other physicians . Hospitalizations, surgeries, and ER visits in previous 12 months . Vitals . Screenings to include cognitive, depression, and falls . Referrals and appointments  In addition, I have reviewed and discussed with patient certain preventive protocols, quality metrics, and best practice recommendations. A written personalized care plan for preventive services as well as general preventive health recommendations were provided to patient.     Barb Merinoickeah E , LPN  78/29/562112/05/2018

## 2018-06-05 ENCOUNTER — Ambulatory Visit: Payer: Medicare Other | Admitting: Nurse Practitioner

## 2018-06-05 ENCOUNTER — Ambulatory Visit: Payer: Medicare Other

## 2018-06-08 ENCOUNTER — Encounter: Payer: Self-pay | Admitting: Internal Medicine

## 2018-06-08 DIAGNOSIS — R001 Bradycardia, unspecified: Secondary | ICD-10-CM | POA: Insufficient documentation

## 2018-06-08 DIAGNOSIS — E119 Type 2 diabetes mellitus without complications: Secondary | ICD-10-CM | POA: Insufficient documentation

## 2018-06-08 DIAGNOSIS — E1169 Type 2 diabetes mellitus with other specified complication: Secondary | ICD-10-CM

## 2018-06-08 DIAGNOSIS — R5382 Chronic fatigue, unspecified: Secondary | ICD-10-CM | POA: Insufficient documentation

## 2018-06-08 DIAGNOSIS — E669 Obesity, unspecified: Secondary | ICD-10-CM | POA: Insufficient documentation

## 2018-06-08 DIAGNOSIS — E1159 Type 2 diabetes mellitus with other circulatory complications: Secondary | ICD-10-CM

## 2018-06-08 DIAGNOSIS — I10 Essential (primary) hypertension: Secondary | ICD-10-CM

## 2018-06-08 NOTE — Progress Notes (Signed)
Subjective:     Patient ID: [name] , female    DOB: 26-Nov-1945 , 72 y.o.   MRN: [id number]   Chief Complaint  Patient presents with  . Annual Exam  . Diabetes  . Hypertension    HPI  She is here today for a full physical exam. She is still having severe fatigue. She is sure something is wrong. She denies change in her appetite or sleep habits.   Diabetes  She presents for her follow-up diabetic visit. She has type 2 diabetes mellitus. Her disease course has been stable. There are no hypoglycemic associated symptoms. Pertinent negatives for hypoglycemia include no headaches. Associated symptoms include fatigue. Pertinent negatives for diabetes include no blurred vision and no chest pain. There are no hypoglycemic complications. Risk factors for coronary artery disease include diabetes mellitus, dyslipidemia, hypertension, obesity, post-menopausal and sedentary lifestyle. She is compliant with treatment most of the time.  Hypertension  This is a chronic problem. The current episode started more than 1 year ago. The problem has been gradually improving since onset. The problem is controlled. Pertinent negatives include no blurred vision, chest pain, headaches, palpitations or shortness of breath.   She reports compliance with meds.   Past Medical History:  Diagnosis Date  . Bladder infection   . Carpal tunnel syndrome   . Diabetes mellitus   . Kidney stone   . Kidney stones      Family History  Problem Relation Age of Onset  . Heart attack Father 6747  . Stroke Father 7070  . Diabetes Father   . Diabetes Brother   . Diabetes Brother   . Breast cancer Sister [id number]  . Diabetes Mother   . Hypertension Mother      Current Outpatient Medications:  .  aspirin EC 81 MG tablet, Take 81 mg by mouth daily. , Disp: , Rfl:  .  atenolol (TENORMIN) 50 MG tablet, Take 50 mg by mouth daily., Disp: , Rfl:  .  cetirizine (ZYRTEC) 10 MG tablet, Take 10 mg by mouth daily., Disp: , Rfl:  .   Coenzyme Q10 (COQ10) 100 MG CAPS, Take 100 mg by mouth daily. , Disp: , Rfl:  .  ergocalciferol (VITAMIN D2) 1.25 MG (50000 UT) capsule, Take 50,000 Units by mouth 2 (two) times a week., Disp: , Rfl:  .  furosemide (LASIX) 20 MG tablet, Take 20 mg by mouth daily., Disp: , Rfl:  .  hydrochlorothiazide (MICROZIDE) 12.5 MG capsule, Take 12.5 mg by mouth daily as needed (excess fluid/swelling). , Disp: , Rfl:  .  losartan (COZAAR) 25 MG tablet, Take 25 mg by mouth See admin instructions. Take one tablet (25 mg) by mouth twice weekly - Tuesdays and Thursdays, Disp: , Rfl:  .  meloxicam (MOBIC) 15 MG tablet, Take 15 mg by mouth daily as needed for pain., Disp: , Rfl: 1 .  metFORMIN (GLUCOPHAGE) 500 MG tablet, Take 500 mg by mouth 2 (two) times daily with a meal., Disp: , Rfl:  .  metroNIDAZOLE (FLAGYL) 500 MG tablet, Take 500 mg by mouth every 8 (eight) hours., Disp: , Rfl:  .  omeprazole (PRILOSEC) 40 MG capsule, Take 40 mg by mouth daily., Disp: , Rfl:  .  pantoprazole (PROTONIX) 40 MG tablet, Take 40 mg by mouth daily., Disp: , Rfl:  .  Pitavastatin Calcium (LIVALO) 4 MG TABS, Take 2 mg by mouth daily. 1/2 tablet, Disp: , Rfl:  .  potassium chloride SA (K-DUR,KLOR-CON) 20 MEQ tablet, Take  20 mEq by mouth daily as needed (with each dose of hydrochlorothiazide). , Disp: , Rfl:  .  sertraline (ZOLOFT) 25 MG tablet, Take 1 tablet (25 mg total) by mouth daily., Disp: 30 tablet, Rfl: 0 .  traMADol (ULTRAM) 50 MG tablet, Take 50 mg by mouth every 6 (six) hours as needed for moderate pain., Disp: , Rfl:    Allergies  Allergen Reactions  . Sulfa Antibiotics Itching  . Sulfonamide Derivatives Itching  . Tetanus Toxoids Swelling    Arm swelled at injection site      No LMP recorded. Patient has had a hysterectomy..  Negative for: breast discharge, breast lump(s), breast pain and breast self exam. Associated symptoms include abnormal vaginal bleeding. Pertinent negatives include abnormal bleeding  (hematology), anxiety, decreased libido, depression, difficulty falling sleep, dyspareunia, history of infertility, nocturia, sexual dysfunction, sleep disturbances, urinary incontinence, urinary urgency, vaginal discharge and vaginal itching. Diet regular.The patient states her exercise level is   minimal.  . The patient's tobacco use is:  Social History   Tobacco Use  Smoking Status Never Smoker  Smokeless Tobacco Never Used  . She has been exposed to passive smoke. The patient's alcohol use is:  Social History   Substance and Sexual Activity  Alcohol Use No    Review of Systems  Constitutional: Positive for fatigue.  HENT: Negative.   Eyes: Negative.  Negative for blurred vision.  Respiratory: Negative.  Negative for shortness of breath.   Cardiovascular: Negative.  Negative for chest pain and palpitations.  Gastrointestinal: Negative.   Endocrine: Negative.   Genitourinary: Negative.   Musculoskeletal: Negative.   Skin: Negative.   Allergic/Immunologic: Negative.   Neurological: Negative.  Negative for headaches.  Hematological: Negative.   Psychiatric/Behavioral: Negative.      Today's Vitals   06/04/18 0957  BP: 110/74  Pulse: (!) 50  Temp: 97.7 F (36.5 C)  TempSrc: Oral  Weight: 213 lb 9.6 oz (96.9 kg)  Height: 5' 2.5" (1.588 m)  PainSc: 0-No pain   Body mass index is 38.45 kg/m.   Objective:  Physical Exam Vitals signs and nursing note reviewed.  Constitutional:      Appearance: Normal appearance. She is obese.  HENT:     Head: Normocephalic and atraumatic.     Right Ear: Tympanic membrane, ear canal and external ear normal. There is no impacted cerumen.     Left Ear: Tympanic membrane, ear canal and external ear normal. There is no impacted cerumen.     Nose: Nose normal.     Mouth/Throat:     Mouth: Mucous membranes are moist.  Neck:     Musculoskeletal: Normal range of motion and neck supple.  Cardiovascular:     Rate and Rhythm: Regular rhythm.  Bradycardia present.     Pulses: Normal pulses.     Heart sounds: Normal heart sounds.  Pulmonary:     Effort: Pulmonary effort is normal.     Breath sounds: Normal breath sounds.  Chest:     Breasts:        Right: Normal. No swelling, bleeding, inverted nipple, mass or nipple discharge.        Left: Normal. No swelling, bleeding, inverted nipple, mass or nipple discharge.  Abdominal:     General: Bowel sounds are normal.     Palpations: Abdomen is soft.     Comments: obese  Genitourinary:    Comments: deferred Musculoskeletal: Normal range of motion.  Skin:    General: Skin is warm and  dry.     Capillary Refill: Capillary refill takes less than 2 seconds.  Neurological:     General: No focal deficit present.     Mental Status: She is alert and oriented to person, place, and time.  Psychiatric:        Mood and Affect: Mood normal.         Assessment And Plan:     1. Routine general medical examination at health care facility  A full exam was performed. Importance of monthly self breast exams was discussed with the patient.  PATIENT HAS BEEN ADVISED TO GET 30-45 MINUTES REGULAR EXERCISE NO LESS THAN FOUR TO FIVE DAYS PER WEEK - BOTH WEIGHTBEARING EXERCISES AND AEROBIC ARE RECOMMENDED.  SHE IS ADVISED TO FOLLOW A HEALTHY DIET WITH AT LEAST SIX FRUITS/VEGGIES PER DAY, DECREASE INTAKE OF RED MEAT, AND TO INCREASE FISH INTAKE TO TWO DAYS PER WEEK.  MEATS/FISH SHOULD NOT BE FRIED, BAKED OR BROILED IS PREFERABLE.  I SUGGEST WEARING SPF 50 SUNSCREEN ON EXPOSED PARTS AND ESPECIALLY WHEN IN THE DIRECT SUNLIGHT FOR AN EXTENDED PERIOD OF TIME.  PLEASE AVOID FAST FOOD RESTAURANTS AND INCREASE YOUR WATER INTAKE.   2. Obesity, diabetes, and hypertension syndrome (HCC)  Diabetic foot exam was performed.  I DISCUSSED WITH THE PATIENT AT LENGTH REGARDING THE GOALS OF GLYCEMIC CONTROL AND POSSIBLE LONG-TERM COMPLICATIONS.  I  ALSO STRESSED THE IMPORTANCE OF COMPLIANCE WITH HOME GLUCOSE MONITORING,  DIETARY RESTRICTIONS INCLUDING AVOIDANCE OF SUGARY DRINKS/PROCESSED FOODS,  ALONG WITH REGULAR EXERCISE.  I  ALSO STRESSED THE IMPORTANCE OF ANNUAL EYE EXAMS, SELF FOOT CARE AND COMPLIANCE WITH OFFICE VISITS.  3. Essential hypertension, benign  Well controlled. She will continue with current meds. She is encouraged to avoid adding salt to her foods.  - EKG 12-Lead  4. Bradycardia  Symptomatic. I will have her start taking 1/2 atenolol nightly. We will see if this helps with her symptoms. I will also refer her to cardiology for further evaluation.  - Ambulatory referral to Cardiology  5. Chronic fatigue  I think her sx are due to her profound bradycardia. She is in agreement w/ cardiology referral. She has been seen by Dr Jacinto Halim in the past.   - Ambulatory referral to Cardiology        [name], MD

## 2018-06-09 DIAGNOSIS — K219 Gastro-esophageal reflux disease without esophagitis: Secondary | ICD-10-CM | POA: Diagnosis not present

## 2018-06-09 DIAGNOSIS — R079 Chest pain, unspecified: Secondary | ICD-10-CM | POA: Diagnosis not present

## 2018-06-09 DIAGNOSIS — K5792 Diverticulitis of intestine, part unspecified, without perforation or abscess without bleeding: Secondary | ICD-10-CM | POA: Diagnosis not present

## 2018-06-12 ENCOUNTER — Telehealth: Payer: Self-pay

## 2018-06-12 ENCOUNTER — Other Ambulatory Visit: Payer: Self-pay

## 2018-06-12 NOTE — Telephone Encounter (Signed)
The pt called back and left a message that she got the message and will start taking 1/2 tab of atenolol.

## 2018-06-12 NOTE — Telephone Encounter (Signed)
-----   Message from Robyn Sanders, MD sent at 06/08/2018  9:10 PM EST ----- Pls ask pt to start taking 1/2 atenolol nightly. Start this immediately. I think we need to cut her atenolol dose in half. Dr. Ganji can determine if she can come off of it permanently.   

## 2018-06-12 NOTE — Telephone Encounter (Signed)
Left the pt a detailed message at her request.

## 2018-06-26 DIAGNOSIS — R69 Illness, unspecified: Secondary | ICD-10-CM | POA: Diagnosis not present

## 2018-07-02 ENCOUNTER — Encounter: Payer: Self-pay | Admitting: Internal Medicine

## 2018-07-02 ENCOUNTER — Ambulatory Visit (INDEPENDENT_AMBULATORY_CARE_PROVIDER_SITE_OTHER): Payer: Medicare HMO | Admitting: Internal Medicine

## 2018-07-02 VITALS — BP 110/64 | HR 50 | Temp 98.0°F | Ht 61.0 in | Wt 211.4 lb

## 2018-07-02 DIAGNOSIS — R5383 Other fatigue: Secondary | ICD-10-CM

## 2018-07-02 DIAGNOSIS — Z6839 Body mass index (BMI) 39.0-39.9, adult: Secondary | ICD-10-CM

## 2018-07-02 DIAGNOSIS — R001 Bradycardia, unspecified: Secondary | ICD-10-CM | POA: Diagnosis not present

## 2018-07-02 NOTE — Patient Instructions (Signed)
Obesity, Adult Obesity is having too much body fat. If you have a BMI of 30 or more, you are obese. BMI is a number that explains how much body fat you have. Obesity is often caused by taking in (consuming) more calories than your body uses. Obesity can cause serious health problems. Changing your lifestyle can help to treat obesity. Follow these instructions at home: Eating and drinking   Follow advice from your doctor about what to eat and drink. Your doctor may tell you to: ? Cut down on (limit) fast foods, sweets, and processed snack foods. ? Choose low-fat options. For example, choose low-fat milk instead of whole milk. ? Eat 5 or more servings of fruits or vegetables every day. ? Eat at home more often. This gives you more control over what you eat. ? Choose healthy foods when you eat out. ? Learn what a healthy portion size is. A portion size is the amount of a certain food that is healthy for you to eat at one time. This is different for each person. ? Keep low-fat snacks available. ? Avoid sugary drinks. These include soda, fruit juice, iced tea that is sweetened with sugar, and flavored milk. ? Eat a healthy breakfast.  Drink enough water to keep your pee (urine) clear or pale yellow.  Do not go without eating for long periods of time (do not fast).  Do not go on popular or trendy diets (fad diets). Physical Activity  Exercise often, as told by your doctor. Ask your doctor: ? What types of exercise are safe for you. ? How often you should exercise.  Warm up and stretch before being active.  Do slow stretching after being active (cool down).  Rest between times of being active. Lifestyle  Limit how much time you spend in front of your TV, computer, or video game system (be less sedentary).  Find ways to reward yourself that do not involve food.  Limit alcohol intake to no more than 1 drink a day for nonpregnant women and 2 drinks a day for men. One drink equals 12 oz  of beer, 5 oz of wine, or 1 oz of hard liquor. General instructions  Keep a weight loss journal. This can help you keep track of: ? The food that you eat. ? The exercise that you do.  Take over-the-counter and prescription medicines only as told by your doctor.  Take vitamins and supplements only as told by your doctor.  Think about joining a support group. Your doctor may be able to help with this.  Keep all follow-up visits as told by your doctor. This is important. Contact a doctor if:  You cannot meet your weight loss goal after you have changed your diet and lifestyle for 6 weeks. This information is not intended to replace advice given to you by your health care provider. Make sure you discuss any questions you have with your health care provider. Document Released: 09/02/2011 Document Revised: 11/16/2015 Document Reviewed: 03/29/2015 Elsevier Interactive Patient Education  2019 [employer]  

## 2018-07-04 DIAGNOSIS — R0600 Dyspnea, unspecified: Secondary | ICD-10-CM | POA: Diagnosis not present

## 2018-07-04 DIAGNOSIS — R0602 Shortness of breath: Secondary | ICD-10-CM | POA: Diagnosis not present

## 2018-07-04 DIAGNOSIS — I10 Essential (primary) hypertension: Secondary | ICD-10-CM | POA: Diagnosis not present

## 2018-07-04 DIAGNOSIS — G4733 Obstructive sleep apnea (adult) (pediatric): Secondary | ICD-10-CM | POA: Diagnosis not present

## 2018-07-13 ENCOUNTER — Encounter: Payer: Self-pay | Admitting: Internal Medicine

## 2018-07-13 NOTE — Progress Notes (Signed)
Subjective:     Patient ID: [name] , female    DOB: 1946-05-25 , 73 y.o.   MRN: [id number]   Chief Complaint  Patient presents with  . Zoloft f/u    HPI  She is here today for f/u Zoloft. She did not take the medication for more than two days. She reports that it made her "feel bad". She is unable to articulate how it made her feel, other than she felt terrible on it. She reports she no longer feels depressed. She is happy to say her daughter will be moving in with her. [place] She finds comfort in having someone else to live in her home with her.     Past Medical History:  Diagnosis Date  . Bladder infection   . Carpal tunnel syndrome   . Diabetes mellitus   . Kidney stone   . Kidney stones      Family History  Problem Relation Age of Onset  . Heart attack Father 35  . Stroke Father 63  . Diabetes Father   . Diabetes Brother   . Diabetes Brother   . Breast cancer Sister 47  . Diabetes Mother   . Hypertension Mother      Current Outpatient Medications:  .  aspirin EC 81 MG tablet, Take 81 mg by mouth daily. , Disp: , Rfl:  .  atenolol (TENORMIN) 50 MG tablet, Take 50 mg by mouth daily. 1/2 tab at bedtime, Disp: , Rfl:  .  cetirizine (ZYRTEC) 10 MG tablet, Take 10 mg by mouth daily., Disp: , Rfl:  .  Coenzyme Q10 (COQ10) 100 MG CAPS, Take 100 mg by mouth daily. , Disp: , Rfl:  .  ergocalciferol (VITAMIN D2) 1.25 MG (50000 UT) capsule, Take 50,000 Units by mouth 2 (two) times a week., Disp: , Rfl:  .  furosemide (LASIX) 20 MG tablet, Take 20 mg by mouth daily., Disp: , Rfl:  .  hydrochlorothiazide (MICROZIDE) 12.5 MG capsule, Take 12.5 mg by mouth daily as needed (excess fluid/swelling). , Disp: , Rfl:  .  losartan (COZAAR) 25 MG tablet, Take 25 mg by mouth See admin instructions. Take one tablet (25 mg) by mouth twice weekly - Tuesdays and Thursdays, Disp: , Rfl:  .  meloxicam (MOBIC) 15 MG tablet, Take 15 mg by mouth  daily as needed for pain., Disp: , Rfl: 1 .  metFORMIN (GLUCOPHAGE) 500 MG tablet, Take 500 mg by mouth 2 (two) times daily with a meal., Disp: , Rfl:  .  omeprazole (PRILOSEC) 40 MG capsule, Take 40 mg by mouth daily., Disp: , Rfl:  .  Pitavastatin Calcium (LIVALO) 4 MG TABS, Take 2 mg by mouth daily. 1/2 tablet, Disp: , Rfl:  .  potassium chloride SA (K-DUR,KLOR-CON) 20 MEQ tablet, Take 20 mEq by mouth daily as needed (with each dose of hydrochlorothiazide). , Disp: , Rfl:  .  traMADol (ULTRAM) 50 MG tablet, Take 50 mg by mouth every 6 (six) hours as needed for moderate pain., Disp: , Rfl:    Allergies  Allergen Reactions  . Sulfa Antibiotics Itching  . Sulfonamide Derivatives Itching  . Tetanus Toxoids Swelling    Arm swelled at injection site     Review of Systems  Constitutional: Positive for fatigue (she reports she is less tired. feels much better since stopping carvedilol).  Respiratory: Negative.   Cardiovascular: Negative.   Gastrointestinal: Negative.   Neurological: Negative.  Psychiatric/Behavioral: Negative.      Today's Vitals   07/02/18 1115  BP: 110/64  Pulse: (!) 50  Temp: 98 F (36.7 C)  TempSrc: Oral  Weight: 211 lb 6.4 oz (95.9 kg)  Height: 5\' 1"  (1.549 m)   Body mass index is 39.94 kg/m.   Objective:  Physical Exam Vitals signs and nursing note reviewed.  Constitutional:      Appearance: Normal appearance. She is obese.  HENT:     Head: Normocephalic and atraumatic.  Cardiovascular:     Rate and Rhythm: Normal rate and regular rhythm.     Heart sounds: Normal heart sounds.  Pulmonary:     Effort: Pulmonary effort is normal.     Breath sounds: Normal breath sounds.  Neurological:     Mental Status: She is alert.         Assessment And Plan:     1. Bradycardia  This has improved since she stopped carvedilol. She has appt scheduled with her cardiologist, Dr. Jacinto Halim on 1/24.   2. Fatigue, unspecified type  Again, this has improved  since she stopped carvedilol. She is encouraged to stay well hydrated.   3. Class 2 severe obesity due to excess calories with serious comorbidity and body mass index (BMI) of 39.0 to 39.9 in adult [facility])  She is encouraged to strive for BMI less than 30 to decrease cardiac risk. Importance of regular exercise was stressed to the patient.   [name], MD

## 2018-07-17 DIAGNOSIS — R0789 Other chest pain: Secondary | ICD-10-CM | POA: Diagnosis not present

## 2018-07-17 DIAGNOSIS — G4734 Idiopathic sleep related nonobstructive alveolar hypoventilation: Secondary | ICD-10-CM | POA: Diagnosis not present

## 2018-07-17 DIAGNOSIS — R002 Palpitations: Secondary | ICD-10-CM | POA: Diagnosis not present

## 2018-07-17 DIAGNOSIS — I10 Essential (primary) hypertension: Secondary | ICD-10-CM | POA: Diagnosis not present

## 2018-07-23 ENCOUNTER — Other Ambulatory Visit: Payer: Self-pay | Admitting: Internal Medicine

## 2018-08-04 DIAGNOSIS — R0600 Dyspnea, unspecified: Secondary | ICD-10-CM | POA: Diagnosis not present

## 2018-08-04 DIAGNOSIS — I10 Essential (primary) hypertension: Secondary | ICD-10-CM | POA: Diagnosis not present

## 2018-08-04 DIAGNOSIS — G4733 Obstructive sleep apnea (adult) (pediatric): Secondary | ICD-10-CM | POA: Diagnosis not present

## 2018-08-04 DIAGNOSIS — R0602 Shortness of breath: Secondary | ICD-10-CM | POA: Diagnosis not present

## 2018-08-18 DIAGNOSIS — R69 Illness, unspecified: Secondary | ICD-10-CM | POA: Diagnosis not present

## 2018-08-26 DIAGNOSIS — K219 Gastro-esophageal reflux disease without esophagitis: Secondary | ICD-10-CM | POA: Diagnosis not present

## 2018-08-26 DIAGNOSIS — R079 Chest pain, unspecified: Secondary | ICD-10-CM | POA: Diagnosis not present

## 2018-08-27 DIAGNOSIS — K219 Gastro-esophageal reflux disease without esophagitis: Secondary | ICD-10-CM | POA: Diagnosis not present

## 2018-08-27 DIAGNOSIS — R12 Heartburn: Secondary | ICD-10-CM | POA: Diagnosis not present

## 2018-09-01 ENCOUNTER — Encounter: Payer: Self-pay | Admitting: Internal Medicine

## 2018-09-02 ENCOUNTER — Encounter: Payer: Self-pay | Admitting: Internal Medicine

## 2018-09-02 ENCOUNTER — Ambulatory Visit (INDEPENDENT_AMBULATORY_CARE_PROVIDER_SITE_OTHER): Payer: Medicare HMO | Admitting: Internal Medicine

## 2018-09-02 VITALS — BP 120/72 | HR 61 | Temp 98.1°F | Ht 61.0 in | Wt 216.2 lb

## 2018-09-02 DIAGNOSIS — G4733 Obstructive sleep apnea (adult) (pediatric): Secondary | ICD-10-CM | POA: Diagnosis not present

## 2018-09-02 DIAGNOSIS — E1169 Type 2 diabetes mellitus with other specified complication: Secondary | ICD-10-CM

## 2018-09-02 DIAGNOSIS — Z6841 Body Mass Index (BMI) 40.0 and over, adult: Secondary | ICD-10-CM

## 2018-09-02 DIAGNOSIS — M170 Bilateral primary osteoarthritis of knee: Secondary | ICD-10-CM

## 2018-09-02 DIAGNOSIS — E1159 Type 2 diabetes mellitus with other circulatory complications: Secondary | ICD-10-CM | POA: Diagnosis not present

## 2018-09-02 DIAGNOSIS — R0602 Shortness of breath: Secondary | ICD-10-CM | POA: Diagnosis not present

## 2018-09-02 DIAGNOSIS — J301 Allergic rhinitis due to pollen: Secondary | ICD-10-CM

## 2018-09-02 DIAGNOSIS — R0600 Dyspnea, unspecified: Secondary | ICD-10-CM | POA: Diagnosis not present

## 2018-09-02 DIAGNOSIS — I10 Essential (primary) hypertension: Secondary | ICD-10-CM

## 2018-09-02 DIAGNOSIS — E669 Obesity, unspecified: Secondary | ICD-10-CM | POA: Diagnosis not present

## 2018-09-02 MED ORDER — FLUTICASONE PROPIONATE 50 MCG/ACT NA SUSP: 1.0000 | Freq: Every day | NASAL | 2 refills | Status: DC

## 2018-09-02 NOTE — Patient Instructions (Addendum)
Diabetes Mellitus and Nutrition, Adult When you have diabetes (diabetes mellitus), it is very important to have healthy eating habits because your blood sugar (glucose) levels are greatly affected by what you eat and drink. Eating healthy foods in the appropriate amounts, at about the same times every day, can help you:  Control your blood glucose.  Lower your risk of heart disease.  Improve your blood pressure.  Reach or maintain a healthy weight. Every person with diabetes is different, and each person has different needs for a meal plan. Your health care provider may recommend that you work with a diet and nutrition specialist (dietitian) to make a meal plan that is best for you. Your meal plan may vary depending on factors such as:  The calories you need.  The medicines you take.  Your weight.  Your blood glucose, blood pressure, and cholesterol levels.  Your activity level.  Other health conditions you have, such as heart or kidney disease. How do carbohydrates affect me? Carbohydrates, also called carbs, affect your blood glucose level more than any other type of food. Eating carbs naturally raises the amount of glucose in your blood. Carb counting is a method for keeping track of how many carbs you eat. Counting carbs is important to keep your blood glucose at a healthy level, especially if you use insulin or take certain oral diabetes medicines. It is important to know how many carbs you can safely have in each meal. This is different for every person. Your dietitian can help you calculate how many carbs you should have at each meal and for each snack. Foods that contain carbs include:  Bread, cereal, rice, pasta, and crackers.  Potatoes and corn.  Peas, beans, and lentils.  Milk and yogurt.  Fruit and juice.  Desserts, such as cakes, cookies, ice cream, and candy. How does alcohol affect me? Alcohol can cause a sudden decrease in blood glucose (hypoglycemia),  especially if you use insulin or take certain oral diabetes medicines. Hypoglycemia can be a life-threatening condition. Symptoms of hypoglycemia (sleepiness, dizziness, and confusion) are similar to symptoms of having too much alcohol. If your health care provider says that alcohol is safe for you, follow these guidelines:  Limit alcohol intake to no more than 1 drink per day for nonpregnant women and 2 drinks per day for men. One drink equals 12 oz of beer, 5 oz of wine, or 1 oz of hard liquor.  Do not drink on an empty stomach.  Keep yourself hydrated with water, diet soda, or unsweetened iced tea.  Keep in mind that regular soda, juice, and other mixers may contain a lot of sugar and must be counted as carbs. What are tips for following this plan?  Reading food labels  Start by checking the serving size on the "Nutrition Facts" label of packaged foods and drinks. The amount of calories, carbs, fats, and other nutrients listed on the label is based on one serving of the item. Many items contain more than one serving per package.  Check the total grams (g) of carbs in one serving. You can calculate the number of servings of carbs in one serving by dividing the total carbs by 15. For example, if a food has 30 g of total carbs, it would be equal to 2 servings of carbs.  Check the number of grams (g) of saturated and trans fats in one serving. Choose foods that have low or no amount of these fats.  Check the number of   milligrams (mg) of salt (sodium) in one serving. Most people should limit total sodium intake to less than 2,300 mg per day.  Always check the nutrition information of foods labeled as "low-fat" or "nonfat". These foods may be higher in added sugar or refined carbs and should be avoided.  Talk to your dietitian to identify your daily goals for nutrients listed on the label. Shopping  Avoid buying canned, premade, or processed foods. These foods tend to be high in fat, sodium,  and added sugar.  Shop around the outside edge of the grocery store. This includes fresh fruits and vegetables, bulk grains, fresh meats, and fresh dairy. Cooking  Use low-heat cooking methods, such as baking, instead of high-heat cooking methods like deep frying.  Cook using healthy oils, such as olive, canola, or sunflower oil.  Avoid cooking with butter, cream, or high-fat meats. Meal planning  Eat meals and snacks regularly, preferably at the same times every day. Avoid going long periods of time without eating.  Eat foods high in fiber, such as fresh fruits, vegetables, beans, and whole grains. Talk to your dietitian about how many servings of carbs you can eat at each meal.  Eat 4-6 ounces (oz) of lean protein each day, such as lean meat, chicken, fish, eggs, or tofu. One oz of lean protein is equal to: ? 1 oz of meat, chicken, or fish. ? 1 egg. ?  cup of tofu.  Eat some foods each day that contain healthy fats, such as avocado, nuts, seeds, and fish. Lifestyle  Check your blood glucose regularly.  Exercise regularly as told by your health care provider. This may include: ? 150 minutes of moderate-intensity or vigorous-intensity exercise each week. This could be brisk walking, biking, or water aerobics. ? Stretching and doing strength exercises, such as yoga or weightlifting, at least 2 times a week.  Take medicines as told by your health care provider.  Do not use any products that contain nicotine or tobacco, such as cigarettes and e-cigarettes. If you need help quitting, ask your health care provider.  Work with a Veterinary surgeon or diabetes educator to identify strategies to manage stress and any emotional and social challenges. Questions to ask a health care provider  Do I need to meet with a diabetes educator?  Do I need to meet with a dietitian?  What number can I call if I have questions?  When are the best times to check my blood glucose? Where to find more  information:  American Diabetes Association: diabetes.org  Academy of Nutrition and Dietetics: www.eatright.[employer] of Diabetes and Digestive and Kidney Diseases (NIH): CarFlippers.tn Summary  A healthy meal plan will help you control your blood glucose and maintain a healthy lifestyle.  Working with a diet and nutrition specialist (dietitian) can help you make a meal plan that is best for you.  Keep in mind that carbohydrates (carbs) and alcohol have immediate effects on your blood glucose levels. It is important to count carbs and to use alcohol carefully. This information is not intended to replace advice given to you by your health care provider. Make sure you discuss any questions you have with your health care provider. Document Released: 03/07/2005 Document Revised: 01/08/2017 Document Reviewed: 07/15/2016 Elsevier Interactive Patient Education  2019 Elsevier Inc.    Acute Knee Pain, Adult Many things can cause knee pain. Sometimes, knee pain is sudden (acute) and may be caused by damage, swelling, or irritation of the muscles and tissues that support  your knee. The pain often goes away on its own with time and rest. If the pain does not go away, tests may be done to find out what is causing the pain. Follow these instructions at home: Pay attention to any changes in your symptoms. Take these actions to relieve your pain. If you have a knee sleeve or brace:   Wear the sleeve or brace as told by your doctor. Remove it only as told by your doctor.  Loosen the sleeve or brace if your toes: ? Tingle. ? Become numb. ? Turn cold and blue.  Keep the sleeve or brace clean.  If the sleeve or brace is not waterproof: ? Do not let it get wet. ? Cover it with a watertight covering when you take a bath or shower. Activity  Rest your knee.  Do not do things that cause pain.  Avoid activities where both feet leave the ground at the same time (high-impact  activities). Examples are running, jumping rope, and doing jumping jacks.  Work with a physical therapist to make a safe exercise program, as told by your doctor. Managing pain, stiffness, and swelling   If told, put ice on the knee: ? Put ice in a plastic bag. ? Place a towel between your skin and the bag. ? Leave the ice on for 20 minutes, 2-3 times a day.  If told, put pressure (compression) on your injured knee to control swelling, give support, and help with discomfort. Compression may be done with an elastic bandage. General instructions  Take all medicines only as told by your doctor.  Raise (elevate) your knee while you are sitting or lying down. Make sure your knee is higher than your heart.  Sleep with a pillow under your knee.  Do not use any products that contain nicotine or tobacco. These include cigarettes, e-cigarettes, and chewing tobacco. These products may slow down healing. If you need help quitting, ask your doctor.  If you are overweight, work with your doctor and a food expert (dietitian) to set goals to lose weight. Being overweight can make your knee hurt more.  Keep all follow-up visits as told by your doctor. This is important. Contact a doctor if:  The knee pain does not stop.  The knee pain changes or gets worse.  You have a fever along with knee pain.  Your knee feels warm when you touch it.  Your knee gives out or locks up. Get help right away if:  Your knee swells, and the swelling gets worse.  You cannot move your knee.  You have very bad knee pain. Summary  Many things can cause knee pain. The pain often goes away on its own with time and rest.  Your doctor may do tests to find out the cause of the pain.  Pay attention to any changes in your symptoms. Relieve your pain with rest, medicines, light activity, and use of ice.  Get help right away if you cannot move your knee or your knee pain is very bad. This information is not  intended to replace advice given to you by your health care provider. Make sure you discuss any questions you have with your health care provider. Document Released: 09/06/2008 Document Revised: 11/20/2017 Document Reviewed: 11/20/2017 Elsevier Interactive Patient Education  2019 [employer].

## 2018-09-02 NOTE — Progress Notes (Signed)
Subjective:     Patient ID: [name] , female    DOB: 02-May-1946 , 73 y.o.   MRN: [id number]   Chief Complaint  Patient presents with  . Diabetes  . Hypertension    HPI  Diabetes  She presents for her follow-up diabetic visit. She has type 2 diabetes mellitus. Her disease course has been stable. There are no hypoglycemic associated symptoms. Pertinent negatives for diabetes include no blurred vision and no chest pain. There are no hypoglycemic complications. Risk factors for coronary artery disease include dyslipidemia, diabetes mellitus, hypertension, obesity, post-menopausal and sedentary lifestyle. Her breakfast blood glucose is taken between 8-9 am. Her breakfast blood glucose range is generally 110-130 mg/dl. An ACE inhibitor/angiotensin II receptor blocker is being taken. Eye exam is current.  Hypertension  This is a chronic problem. The current episode started more than 1 year ago. The problem has been gradually improving since onset. The problem is controlled. Pertinent negatives include no blurred vision, chest pain, palpitations or shortness of breath.  She reports compliance with meds.    Past Medical History:  Diagnosis Date  . Bladder infection   . Carpal tunnel syndrome   . Diabetes mellitus   . Kidney stone   . Kidney stones      Family History  Problem Relation Age of Onset  . Heart attack Father 1  . Stroke Father 78  . Diabetes Father   . Diabetes Brother   . Diabetes Brother   . Breast cancer Sister 9  . Diabetes Mother   . Hypertension Mother      Current Outpatient Medications:  .  aspirin EC 81 MG tablet, Take 81 mg by mouth daily. , Disp: , Rfl:  .  atenolol (TENORMIN) 50 MG tablet, Take 50 mg by mouth daily. 1/2 tab at bedtime, Disp: , Rfl:  .  cetirizine (ZYRTEC) 10 MG tablet, Take 10 mg by mouth daily., Disp: , Rfl:  .  Coenzyme Q10 (COQ10) 100 MG CAPS, Take 100 mg by mouth daily. , Disp: , Rfl:  .  ergocalciferol (VITAMIN D2) 1.25 MG  (50000 UT) capsule, Take 50,000 Units by mouth 2 (two) times a week., Disp: , Rfl:  .  furosemide (LASIX) 20 MG tablet, Take 20 mg by mouth daily., Disp: , Rfl:  .  hydrochlorothiazide (MICROZIDE) 12.5 MG capsule, Take 12.5 mg by mouth daily as needed (excess fluid/swelling). , Disp: , Rfl:  .  losartan (COZAAR) 25 MG tablet, Take 25 mg by mouth See admin instructions. Take one tablet (25 mg) by mouth twice weekly - Tuesdays and Thursdays, Disp: , Rfl:  .  meloxicam (MOBIC) 15 MG tablet, TAKE 1 TABLET BY MOUTH ONCE DAILY AS NEEDED FOR HIP PAIN, Disp: 30 tablet, Rfl: 0 .  metFORMIN (GLUCOPHAGE) 500 MG tablet, Take 500 mg by mouth 2 (two) times daily with a meal., Disp: , Rfl:  .  omeprazole (PRILOSEC) 40 MG capsule, Take 40 mg by mouth daily., Disp: , Rfl:  .  Pitavastatin Calcium (LIVALO) 4 MG TABS, Take 2 mg by mouth daily. 1/2 tablet, Disp: , Rfl:  .  potassium chloride SA (K-DUR,KLOR-CON) 20 MEQ tablet, Take 20 mEq by mouth daily as needed (with each dose of hydrochlorothiazide). , Disp: , Rfl:  .  traMADol (ULTRAM) 50 MG tablet, Take 50 mg by mouth every 6 (six) hours as needed for moderate pain., Disp: , Rfl:  .  fluticasone (FLONASE) 50 MCG/ACT nasal spray, Place 1 spray into both nostrils  daily., Disp: 16 g, Rfl: 2   Allergies  Allergen Reactions  . Sulfa Antibiotics Itching  . Sulfonamide Derivatives Itching  . Tetanus Toxoids Swelling    Arm swelled at injection site     Review of Systems  Constitutional: Negative.   HENT: Positive for postnasal drip.   Eyes: Negative for blurred vision.  Respiratory: Negative.  Negative for shortness of breath.   Cardiovascular: Negative.  Negative for chest pain and palpitations.  Gastrointestinal: Negative.   Musculoskeletal: Positive for arthralgias (she c/o b/l knee pain. there is some pain with ambulation. ).  Neurological: Negative.   Psychiatric/Behavioral: Negative.      Today's Vitals   09/02/18 1006  BP: 120/72  Pulse: 61   Temp: 98.1 F (36.7 C)  TempSrc: Oral  Weight: 216 lb 3.2 oz (98.1 kg)  Height: 5\' 1"  (1.549 m)  PainSc: 8   PainLoc: Knee   Body mass index is 40.85 kg/m.   Objective:  Physical Exam Vitals signs and nursing note reviewed.  Constitutional:      Appearance: Normal appearance.  HENT:     Head: Normocephalic and atraumatic.  Cardiovascular:     Rate and Rhythm: Normal rate and regular rhythm.     Heart sounds: Normal heart sounds.  Pulmonary:     Effort: Pulmonary effort is normal.     Breath sounds: Normal breath sounds.  Skin:    General: Skin is warm.  Neurological:     General: No focal deficit present.     Mental Status: She is alert.  Psychiatric:        Mood and Affect: Mood normal.        Behavior: Behavior normal.         Assessment And Plan:     1. Obesity, diabetes, and hypertension syndrome (HCC)  I will check a CMP and Hba1c today. Importance of medication, dietary and exercise compliance was discussed with the patient. She is encouraged to avoid sugary beverages and salty foods.   2. Seasonal allergic rhinitis due to pollen  She is encouraged to try zyrtec 10mg  nightly.   3. Primary osteoarthritis of both knees  Chronic. She is encouraged to lose weight to help decrease her symptoms. I will refer her to Ortho for further evaluation.   4. Class 3 severe obesity due to excess calories with serious comorbidity and body mass index (BMI) of 40.0 to 44.9 in adult [facility])  Importance of achieving optimal weight to decrease risk of cardiovascular disease and cancers was discussed with the patient in full detail. She is encouraged to start slowly - start with 10 minutes twice daily at least three to four days per week and to gradually build to 30 minutes five days weekly. She was given tips to incorporate more activity into her daily routine - take stairs when possible, park farther away from grocery stores, etc. .     [name], MD

## 2018-09-03 LAB — CMP14+EGFR
ALT: 10 IU/L (ref 0–32)
AST: 13 IU/L (ref 0–40)
Albumin/Globulin Ratio: 1.9 (ref 1.2–2.2)
Albumin: 4.2 g/dL (ref 3.7–4.7)
Alkaline Phosphatase: 91 IU/L (ref 39–117)
BUN/Creatinine Ratio: 16 (ref 12–28)
BUN: 13 mg/dL (ref 8–27)
Bilirubin Total: 0.3 mg/dL (ref 0.0–1.2)
CALCIUM: 9.7 mg/dL (ref 8.7–10.3)
CO2: 23 mmol/L (ref 20–29)
CREATININE: 0.8 mg/dL (ref 0.57–1.00)
Chloride: 104 mmol/L (ref 96–106)
GFR calc Af Amer: 85 mL/min/{1.73_m2} (ref >59)
GFR, EST NON AFRICAN AMERICAN: 73 mL/min/{1.73_m2} (ref >59)
GLOBULIN, TOTAL: 2.2 g/dL (ref 1.5–4.5)
Glucose: 117 mg/dL — ABNORMAL HIGH (ref 65–99)
Potassium: 4.8 mmol/L (ref 3.5–5.2)
Sodium: 143 mmol/L (ref 134–144)
TOTAL PROTEIN: 6.4 g/dL (ref 6.0–8.5)

## 2018-09-03 LAB — HEMOGLOBIN A1C
ESTIMATED AVERAGE GLUCOSE: 123 mg/dL
Hgb A1c MFr Bld: 5.9 % — ABNORMAL HIGH (ref 4.8–5.6)

## 2018-09-04 ENCOUNTER — Telehealth: Payer: Self-pay

## 2018-09-04 NOTE — Telephone Encounter (Signed)
Contacted patient informed patient of medication changes from Dr. Allyne Gee

## 2018-09-04 NOTE — Telephone Encounter (Signed)
-----   Message from Dorothyann Peng, MD sent at 06/08/2018  9:10 PM EST ----- Pls ask pt to start taking 1/2 atenolol nightly. Start this immediately. I think we need to cut her atenolol dose in half. Dr. Jacinto Halim can determine if she can come off of it permanently.

## 2018-09-16 ENCOUNTER — Other Ambulatory Visit: Payer: Self-pay | Admitting: Internal Medicine

## 2018-09-29 ENCOUNTER — Telehealth: Payer: Self-pay

## 2018-09-29 NOTE — Telephone Encounter (Signed)
I returned the pt's call and notified her of her most recent lab results. 

## 2018-10-03 DIAGNOSIS — R0602 Shortness of breath: Secondary | ICD-10-CM | POA: Diagnosis not present

## 2018-10-03 DIAGNOSIS — G4733 Obstructive sleep apnea (adult) (pediatric): Secondary | ICD-10-CM | POA: Diagnosis not present

## 2018-10-03 DIAGNOSIS — I10 Essential (primary) hypertension: Secondary | ICD-10-CM | POA: Diagnosis not present

## 2018-10-03 DIAGNOSIS — R0600 Dyspnea, unspecified: Secondary | ICD-10-CM | POA: Diagnosis not present

## 2018-10-05 ENCOUNTER — Ambulatory Visit: Payer: Medicare HMO | Admitting: Internal Medicine

## 2018-10-05 DIAGNOSIS — R69 Illness, unspecified: Secondary | ICD-10-CM | POA: Diagnosis not present

## 2018-10-29 ENCOUNTER — Other Ambulatory Visit: Payer: Self-pay | Admitting: Internal Medicine

## 2018-10-31 IMAGING — CR DG CHEST 2V
2 series · 2 of 2 positions shown · non-contrast
Comparison: Chest x-rays dated 08/01/2017 and 05/28/2016.

CLINICAL DATA: Chest pain and shortness of breath for 3 days.

EXAM:
CHEST - 2 VIEW

[chest lat]
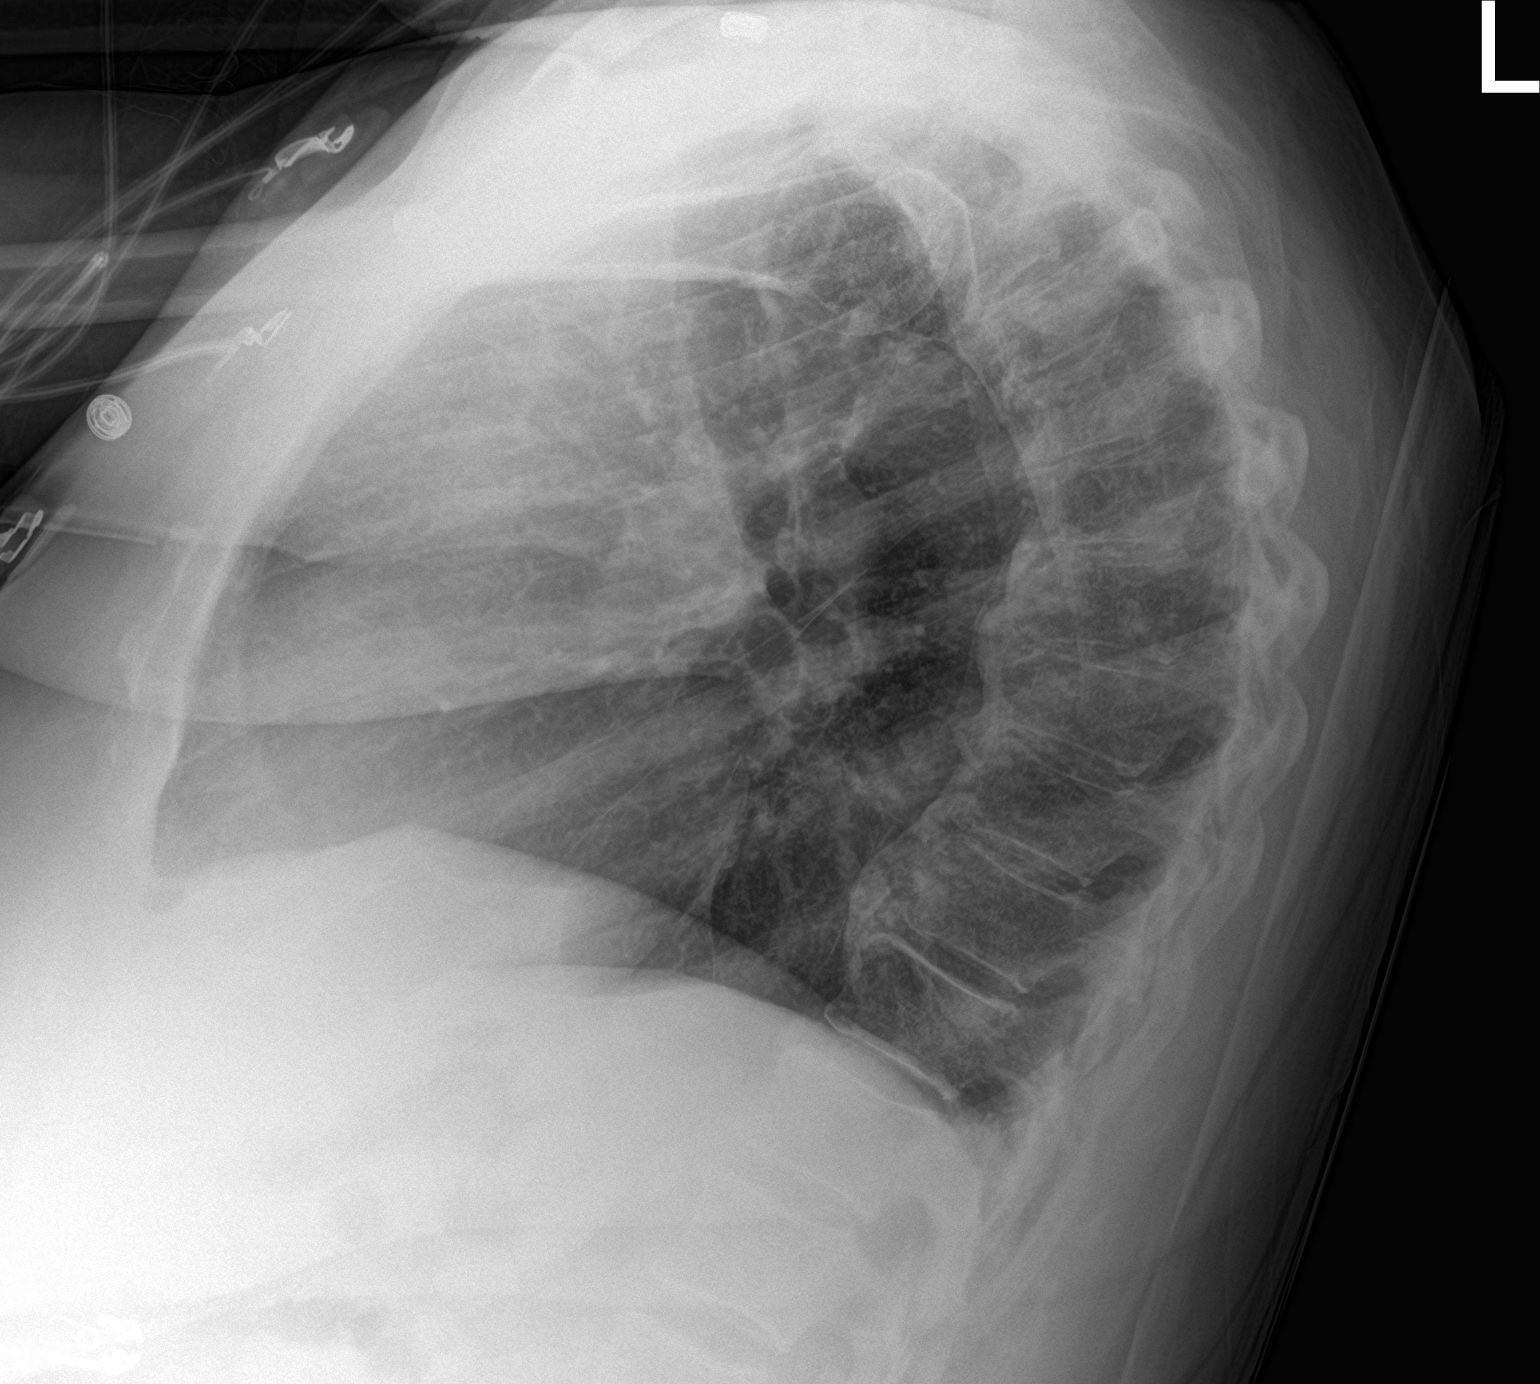

[chest ap]
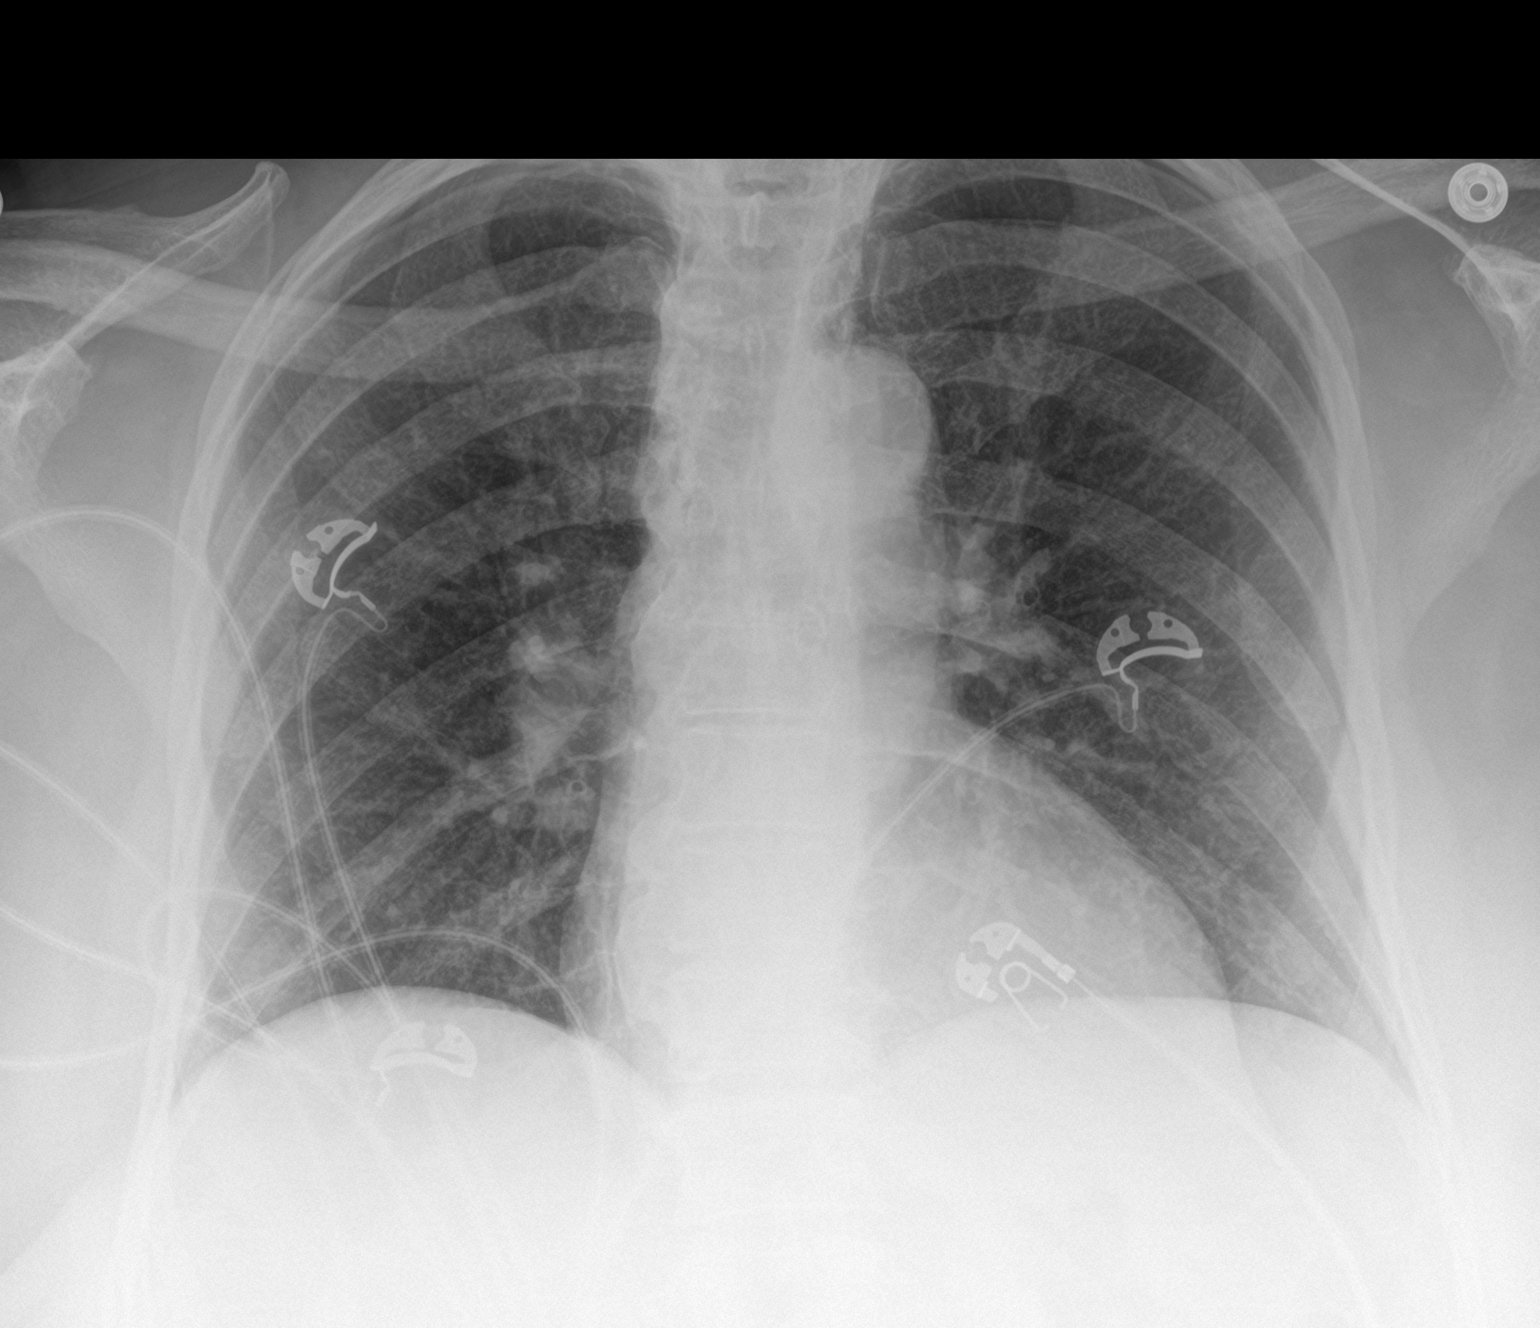

[2 of 2 positions shown; findings below may reference images not displayed]

FINDINGS: Heart size and mediastinal contours are within normal limits. Lungs
are clear. No pleural effusion or pneumothorax seen. No acute or
suspicious osseous finding. Degenerative spondylitic changes again
noted throughout the kyphotic thoracic spine, moderate in degree.
IMPRESSION: No active cardiopulmonary disease. No evidence of pneumonia or
pulmonary edema.

## 2018-11-02 DIAGNOSIS — R0600 Dyspnea, unspecified: Secondary | ICD-10-CM | POA: Diagnosis not present

## 2018-11-02 DIAGNOSIS — K580 Irritable bowel syndrome with diarrhea: Secondary | ICD-10-CM | POA: Diagnosis not present

## 2018-11-02 DIAGNOSIS — K219 Gastro-esophageal reflux disease without esophagitis: Secondary | ICD-10-CM | POA: Diagnosis not present

## 2018-11-02 DIAGNOSIS — R0602 Shortness of breath: Secondary | ICD-10-CM | POA: Diagnosis not present

## 2018-11-02 DIAGNOSIS — G4733 Obstructive sleep apnea (adult) (pediatric): Secondary | ICD-10-CM | POA: Diagnosis not present

## 2018-11-02 DIAGNOSIS — R1032 Left lower quadrant pain: Secondary | ICD-10-CM | POA: Diagnosis not present

## 2018-11-02 DIAGNOSIS — I10 Essential (primary) hypertension: Secondary | ICD-10-CM | POA: Diagnosis not present

## 2018-11-03 ENCOUNTER — Encounter: Payer: Self-pay | Admitting: Cardiology

## 2018-11-04 ENCOUNTER — Encounter: Payer: Self-pay | Admitting: Cardiology

## 2018-11-04 ENCOUNTER — Ambulatory Visit (INDEPENDENT_AMBULATORY_CARE_PROVIDER_SITE_OTHER): Payer: Medicare HMO | Admitting: Cardiology

## 2018-11-04 VITALS — Ht 63.0 in | Wt 210.0 lb

## 2018-11-04 DIAGNOSIS — K219 Gastro-esophageal reflux disease without esophagitis: Secondary | ICD-10-CM

## 2018-11-04 DIAGNOSIS — I493 Ventricular premature depolarization: Secondary | ICD-10-CM

## 2018-11-04 DIAGNOSIS — R002 Palpitations: Secondary | ICD-10-CM

## 2018-11-04 NOTE — Progress Notes (Signed)
Primary Physician/Referring:  Dorothyann Peng, MD  Patient ID: [name], female    DOB: 02/18/1946, 73 y.o.   MRN: [id number]  Chief Complaint  Patient presents with  . Hypertension  . Hyperlipidemia  . PVC  . Follow-up    9yr    HPI: [name]  is a 73 y.o. female  with  diabetes mellitus, hypertension, hyperlipidemia, morbid obesity, nocturnal hypoxemia presently on oxygen supplementation, chronic dyspnea and palpitations. She also has GERD and hiatal hernia.  This is her 6 month OV for palpitations. States since being on PPI palpitations have improved. No chest pain. No recent change in weight.   Past Medical History:  Diagnosis Date  . Bladder infection   . Carpal tunnel syndrome   . Diabetes mellitus   . Kidney stone   . Kidney stones     Past Surgical History:  Procedure Laterality Date  . ABDOMINAL HYSTERECTOMY     partial  . APPENDECTOMY    . HERNIA REPAIR    . TONSILLECTOMY      Social History   Socioeconomic History  . Marital status: Widowed    Spouse name: Not on file  . Number of children: 2  . Years of education: Not on file  . Highest education level: Not on file  Occupational History  . Not on file  Social Needs  . Financial resource strain: Not on file  . Food insecurity:    Worry: Not on file    Inability: Not on file  . Transportation needs:    Medical: Not on file    Non-medical: Not on file  Tobacco Use  . Smoking status: Never Smoker  . Smokeless tobacco: Never Used  Substance and Sexual Activity  . Alcohol use: No  . Drug use: No  . Sexual activity: Never  Lifestyle  . Physical activity:    Days per week: Not on file    Minutes per session: Not on file  . Stress: Not on file  Relationships  . Social connections:    Talks on phone: Not on file    Gets together: Not on file    Attends religious service: Not on file    Active member of club or organization: Not on file    Attends meetings of clubs or  organizations: Not on file    Relationship status: Not on file  . Intimate partner violence:    Fear of current or ex partner: Not on file    Emotionally abused: Not on file    Physically abused: Not on file    Forced sexual activity: Not on file  Other Topics Concern  . Not on file  Social History Narrative  . Not on file    Review of Systems  Constitution: Negative for chills, decreased appetite, malaise/fatigue and weight gain.  Cardiovascular: Positive for leg swelling (chronic and stable) and palpitations (occasional). Negative for dyspnea on exertion and syncope.  Endocrine: Negative for cold intolerance.  Hematologic/Lymphatic: Does not bruise/bleed easily.  Musculoskeletal: Negative for joint swelling.  Gastrointestinal: Negative for abdominal pain, anorexia, change in bowel habit, hematochezia and melena.  Neurological: Negative for headaches and light-headedness.  Psychiatric/Behavioral: Negative for depression and substance abuse.  All other systems reviewed and are negative.     Objective  Height  (1.6 m), weight 210 lb (95.3 kg). Body mass index is 37.2 kg/m.    Physical Exam  Constitutional: She appears well-developed. No distress.  Moderately obese  HENT:  Head: Atraumatic.  Eyes: Conjunctivae are normal.  Neck: Neck supple. No JVD present. No thyromegaly present.  Cardiovascular: Normal rate, regular rhythm, normal heart sounds, intact distal pulses and normal pulses. Exam reveals no gallop.  No murmur heard. Varicose veins bilateral legs noted. Trace edema  Pulmonary/Chest: Effort normal and breath sounds normal.  Abdominal: Soft. Bowel sounds are normal.  Musculoskeletal: Normal range of motion.  Neurological: She is alert.  Skin: Skin is warm and dry.  Psychiatric: She has a normal mood and affect.   Radiology: No results found.  Laboratory examination:    CMP Latest Ref Rng & Units 09/02/2018 05/12/2018 04/05/2018  Glucose 65 - 99 mg/dL  161(W117(H) 960(A101(H) 540(J120(H)  BUN 8 - 27 mg/dL 13 9 9   Creatinine 0.57 - 1.00 mg/dL 8.110.80 9.140.75 7.820.75  Sodium 134 - 144 mmol/L 143 142 141  Potassium 3.5 - 5.2 mmol/L 4.8 4.1 3.9  Chloride 96 - 106 mmol/L 104 102 108  CO2 20 - 29 mmol/L 23 23 24   Calcium 8.7 - 10.3 mg/dL 9.7 9.5 9.1  Total Protein 6.0 - 8.5 g/dL 6.4 - 6.5  Total Bilirubin 0.0 - 1.2 mg/dL 0.3 - 9.5(A0.2(L)  Alkaline Phos 39 - 117 IU/L 91 - 84  AST 0 - 40 IU/L 13 - 21  ALT 0 - 32 IU/L 10 - 13   CBC Latest Ref Rng & Units 04/05/2018 08/02/2017 08/01/2017  WBC 4.0 - 10.5 K/uL 5.5 7.0 5.7  Hemoglobin 12.0 - 15.0 g/dL 11.5(L) 11.4(L) 12.5  Hematocrit 36.0 - 46.0 % 38.5 36.4 39.3  Platelets 150 - 400 K/uL 254 252 216   Lipid Panel     Component Value Date/Time   CHOL 166 05/12/2018 1211   TRIG 50 05/12/2018 1211   HDL 84 05/12/2018 1211   CHOLHDL 2.0 05/12/2018 1211   CHOLHDL 3.3 05/29/2016 0622   VLDL 23 05/29/2016 0622   LDLCALC 72 05/12/2018 1211   HEMOGLOBIN A1C Lab Results  Component Value Date   HGBA1C 5.9 (H) 09/02/2018   MPG 131 05/28/2016   TSH No results for input(s): TSH in the last 8760 hours.  PRN Meds:. There are no discontinued medications. Current Meds  Medication Sig  . aspirin EC 81 MG tablet Take 81 mg by mouth daily.   Marland Kitchen. atenolol (TENORMIN) 50 MG tablet Take 50 mg by mouth daily. 1/2 tab at bedtime  . cetirizine (ZYRTEC) 10 MG tablet Take 10 mg by mouth daily.  . Coenzyme Q10 (COQ10) 100 MG CAPS Take 100 mg by mouth daily.   . fluticasone (FLONASE) 50 MCG/ACT nasal spray Place 1 spray into both nostrils daily.  . furosemide (LASIX) 20 MG tablet Take 20 mg by mouth daily.  . hydrochlorothiazide (MICROZIDE) 12.5 MG capsule Take 12.5 mg by mouth daily as needed (excess fluid/swelling).   Marland Kitchen. losartan (COZAAR) 25 MG tablet Take 25 mg by mouth See admin instructions. Take one tablet (25 mg) by mouth twice weekly - Tuesdays and Thursdays  . meloxicam (MOBIC) 15 MG tablet TAKE 1 TABLET BY MOUTH ONCE DAILY AS  NEEDED FOR HIP PAIN  . metFORMIN (GLUCOPHAGE) 500 MG tablet Take 500 mg by mouth 2 (two) times daily with a meal.  . metroNIDAZOLE (FLAGYL) 500 MG tablet Take 500 mg by mouth every 8 (eight) hours.  Marland Kitchen. omeprazole (PRILOSEC) 40 MG capsule Take 40 mg by mouth daily.  . Pitavastatin Calcium (LIVALO) 4 MG TABS Take 2 mg by mouth daily. 1/2 tablet Mon, Wed, Fri  .  potassium chloride SA (K-DUR,KLOR-CON) 20 MEQ tablet Take 20 mEq by mouth daily as needed (with each dose of hydrochlorothiazide).   . traMADol (ULTRAM) 50 MG tablet Take 50 mg by mouth every 6 (six) hours as needed for moderate pain.  . Vitamin D, Ergocalciferol, (DRISDOL) 1.25 MG (50000 UT) CAPS capsule TAKE 1 CAPSULE BY MOUTH ON TUESDAYS AND FRIDAYS    Cardiac Studies:    Echocardiogram 08/26/2017: Left ventricle cavity is normal in size. Mild concentric hypertrophy of the left ventricle. Normal global wall motion. Normal diastolic filling pattern, normal LAP. Calculated EF 66%. Left atrial cavity is mild to moderately dilated. LA is much larger than the measured AP diameter 4.1 cm.  Right atrial cavity is mild to moderately dilated. Trace aortic regurgitation. Mild to moderate tricuspid regurgitation. Mild pulmonary hypertension. Estimated pulmonary artery systolic pressure 32  mmHg IVC is dilated with respiratory variation. Suggests elevated central venous pressure. Compared to the study done on 04/09/2012, biatrial enlargement and mild to moderate tricuspid regurgitation is new.  Lexiscan myoview stress test 08/18/2017:  1. Pharmacologic stress testing was performed with intravenous administration of .4 mg of Lexiscan over a 10-15 seconds infusion. Patient was hypertensive through out the study, max BP 160/88 mmHg. Stress symptoms included dyspnea, nausea. Exercise capacity not assessed. Stress EKG is non diagnostic for ischemia as it is a pharmacologic stress.  2. The overall quality of the study is good.  Left ventricular cavity  is noted to be normal on the rest and stress studies.  Review of the raw data in a rotational cine format reveals breast attenuation, with imaging performed in sitting position. Gated SPECT images reveal normal myocardial thickening and wall motion.  The left ventricular ejection fraction was calculated or visually estimated to be 62%.  REST and STRESS images demonstrate medium sized area of moderately decreased tracer uptake in the basal inferoseptal, mid inferoseptal and apical inferior segments of the left ventricle. Defect improves on stress images. Defects are likely related to breast attenuation. Superimposed ischemia in this region cannot be excluded.  3. This is a low risk study.  ABI 04/01/2013: This exam reveals normal perfusion of both the lower extremities with bilateral ABI of 1.04.  Assessment   Palpitations  PVC (premature ventricular contraction)  Gastroesophageal reflux disease without esophagitis  Recommendations:   Patient is on a virtual visit and follow-up for palpitations.  She has had low risk stress test 6 months ago to 8 months ago.  She wants to come off of very low-dose of atenolol that she is on as her blood pressure has been controlled and she has not had any further palpitations.  As her PVCs are very few and not pathologic, I advised her that she can certainly try to come off of the medication and if symptoms of palpitations were to recur she can restart the medication as she is on very low dose.  Patient also has noticed significant improvement in symptoms of GERD since being on PPI and also has noticed improvement in palpitation since being on PPI.  Obesity still continues to be an issue and she is being closely followed by her PCP regarding the management of obesity.  I'll see her back on a p.r.n. basis.  Yates Decamp, MD, [facility] 11/04/2018, 4:30 PM [facility] Pager: [phone] Office: [phone] If no answer Cell [phone]

## 2018-11-19 DIAGNOSIS — M1711 Unilateral primary osteoarthritis, right knee: Secondary | ICD-10-CM | POA: Diagnosis not present

## 2018-11-19 DIAGNOSIS — M1712 Unilateral primary osteoarthritis, left knee: Secondary | ICD-10-CM | POA: Diagnosis not present

## 2018-12-03 DIAGNOSIS — R0600 Dyspnea, unspecified: Secondary | ICD-10-CM | POA: Diagnosis not present

## 2018-12-03 DIAGNOSIS — G4733 Obstructive sleep apnea (adult) (pediatric): Secondary | ICD-10-CM | POA: Diagnosis not present

## 2018-12-03 DIAGNOSIS — R0602 Shortness of breath: Secondary | ICD-10-CM | POA: Diagnosis not present

## 2018-12-03 DIAGNOSIS — I10 Essential (primary) hypertension: Secondary | ICD-10-CM | POA: Diagnosis not present

## 2018-12-18 DIAGNOSIS — Z1231 Encounter for screening mammogram for malignant neoplasm of breast: Secondary | ICD-10-CM | POA: Diagnosis not present

## 2018-12-18 DIAGNOSIS — Z803 Family history of malignant neoplasm of breast: Secondary | ICD-10-CM | POA: Diagnosis not present

## 2018-12-18 LAB — HM MAMMOGRAPHY: HM Mammogram: NORMAL (ref 0–4)

## 2018-12-31 ENCOUNTER — Other Ambulatory Visit: Payer: Self-pay

## 2018-12-31 ENCOUNTER — Ambulatory Visit (INDEPENDENT_AMBULATORY_CARE_PROVIDER_SITE_OTHER): Payer: Medicare HMO | Admitting: Internal Medicine

## 2018-12-31 ENCOUNTER — Encounter: Payer: Self-pay | Admitting: Internal Medicine

## 2018-12-31 VITALS — BP 132/80 | HR 70 | Temp 98.6°F | Ht 63.0 in | Wt 218.6 lb

## 2018-12-31 DIAGNOSIS — E669 Obesity, unspecified: Secondary | ICD-10-CM | POA: Diagnosis not present

## 2018-12-31 DIAGNOSIS — E1169 Type 2 diabetes mellitus with other specified complication: Secondary | ICD-10-CM

## 2018-12-31 DIAGNOSIS — Z6838 Body mass index (BMI) 38.0-38.9, adult: Secondary | ICD-10-CM | POA: Diagnosis not present

## 2018-12-31 DIAGNOSIS — E559 Vitamin D deficiency, unspecified: Secondary | ICD-10-CM

## 2018-12-31 DIAGNOSIS — E1159 Type 2 diabetes mellitus with other circulatory complications: Secondary | ICD-10-CM | POA: Diagnosis not present

## 2018-12-31 DIAGNOSIS — I10 Essential (primary) hypertension: Secondary | ICD-10-CM

## 2018-12-31 DIAGNOSIS — E7800 Pure hypercholesterolemia, unspecified: Secondary | ICD-10-CM | POA: Diagnosis not present

## 2018-12-31 DIAGNOSIS — I152 Hypertension secondary to endocrine disorders: Secondary | ICD-10-CM

## 2018-12-31 DIAGNOSIS — E66812 Obesity, class 2: Secondary | ICD-10-CM

## 2018-12-31 MED ORDER — PRAVASTATIN SODIUM 80 MG PO TABS: ORAL_TABLET | ORAL | 2 refills | Status: DC

## 2018-12-31 MED ORDER — MELOXICAM 15 MG PO TABS: ORAL_TABLET | ORAL | 2 refills | Status: DC

## 2018-12-31 MED ORDER — POTASSIUM CHLORIDE CRYS ER 20 MEQ PO TBCR: 20.0000 meq | EXTENDED_RELEASE_TABLET | Freq: Every day | ORAL | 2 refills | Status: DC | PRN

## 2018-12-31 NOTE — Patient Instructions (Signed)

## 2018-12-31 NOTE — Progress Notes (Signed)
Subjective:     Patient ID: [name] , female    DOB: [birth year] , 73 y.o.   MRN: [id number]   Chief Complaint  Patient presents with  . Diabetes  . Hypertension    HPI  Diabetes She presents for her follow-up diabetic visit. She has type 2 diabetes mellitus. Her disease course has been stable. There are no hypoglycemic associated symptoms. Pertinent negatives for diabetes include no blurred vision and no chest pain. There are no hypoglycemic complications. Risk factors for coronary artery disease include dyslipidemia, diabetes mellitus, hypertension, obesity, post-menopausal and sedentary lifestyle. She is following a generally healthy diet. Her home blood glucose trend is fluctuating minimally. Her breakfast blood glucose is taken between 8-9 am. Her breakfast blood glucose range is generally 110-130 mg/dl. An ACE inhibitor/angiotensin II receptor blocker is being taken. Eye exam is current.  Hypertension This is a chronic problem. The current episode started more than 1 year ago. The problem has been gradually improving since onset. The problem is controlled. Pertinent negatives include no blurred vision, chest pain, palpitations or shortness of breath. Risk factors for coronary artery disease include diabetes mellitus, dyslipidemia, obesity, post-menopausal state and sedentary lifestyle. Past treatments include angiotensin blockers and diuretics.     Past Medical History:  Diagnosis Date  . Bladder infection   . Carpal tunnel syndrome   . Diabetes mellitus   . Kidney stone   . Kidney stones      Family History  Problem Relation Age of Onset  . Heart attack Father 50  . Stroke Father 67  . Diabetes Father   . Diabetes Brother   . Diabetes Brother   . Breast cancer Sister 69  . Diabetes Mother   . Hypertension Mother      Current Outpatient Medications:  .  aspirin EC 81 MG tablet, Take 81 mg by mouth daily. , Disp: , Rfl:  .  cetirizine (ZYRTEC) 10 MG tablet,  Take 10 mg by mouth daily., Disp: , Rfl:  .  Coenzyme Q10 (COQ10) 100 MG CAPS, Take 100 mg by mouth daily. , Disp: , Rfl:  .  fluticasone (FLONASE) 50 MCG/ACT nasal spray, Place 1 spray into both nostrils daily., Disp: 16 g, Rfl: 2 .  furosemide (LASIX) 20 MG tablet, Take 20 mg by mouth daily., Disp: , Rfl:  .  hydrochlorothiazide (MICROZIDE) 12.5 MG capsule, Take 12.5 mg by mouth daily as needed (excess fluid/swelling). , Disp: , Rfl:  .  losartan (COZAAR) 25 MG tablet, Take 25 mg by mouth See admin instructions. Take one tablet (25 mg) by mouth twice weekly - Tuesdays and Thursdays, Disp: , Rfl:  .  meloxicam (MOBIC) 15 MG tablet, TAKE 1 TABLET BY MOUTH ONCE DAILY AS NEEDED FOR HIP PAIN, Disp: 90 tablet, Rfl: 2 .  metFORMIN (GLUCOPHAGE) 500 MG tablet, Take 500 mg by mouth 2 (two) times daily with a meal., Disp: , Rfl:  .  omeprazole (PRILOSEC) 40 MG capsule, Take 40 mg by mouth daily., Disp: , Rfl:  .  potassium chloride SA (K-DUR) 20 MEQ tablet, Take 1 tablet (20 mEq total) by mouth daily as needed (with each dose of hydrochlorothiazide)., Disp: 90 tablet, Rfl: 2 .  traMADol (ULTRAM) 50 MG tablet, Take 50 mg by mouth every 6 (six) hours as needed for moderate pain., Disp: , Rfl:  .  Vitamin D, Ergocalciferol, (DRISDOL) 1.25 MG (50000 UT) CAPS capsule, TAKE 1 CAPSULE BY MOUTH ON TUESDAYS AND FRIDAYS, Disp: 24 capsule, Rfl:  0 .  pravastatin (PRAVACHOL) 80 MG tablet, One tab po qweekly on Mondays, Disp: 12 tablet, Rfl: 2   Allergies  Allergen Reactions  . Sulfa Antibiotics Itching  . Sulfonamide Derivatives Itching  . Tetanus Toxoids Swelling    Arm swelled at injection site     Review of Systems  Constitutional: Negative.   Eyes: Negative for blurred vision.  Respiratory: Negative.  Negative for shortness of breath.   Cardiovascular: Negative.  Negative for chest pain and palpitations.  Gastrointestinal: Negative.   Neurological: Negative.   Psychiatric/Behavioral: Negative.       Today's Vitals   12/31/18 1450  BP: 132/80  Pulse: 70  Temp: 98.6 F (37 C)  TempSrc: Oral  Weight: 218 lb 9.6 oz (99.2 kg)  Height: '5\' 3"'  (1.6 m)  PainSc: 8    Body mass index is 38.72 kg/m.   Objective:  Physical Exam Vitals signs and nursing note reviewed.  Constitutional:      Appearance: Normal appearance.  HENT:     Head: Normocephalic and atraumatic.  Cardiovascular:     Rate and Rhythm: Normal rate and regular rhythm.     Heart sounds: Normal heart sounds.  Pulmonary:     Effort: Pulmonary effort is normal.     Breath sounds: Normal breath sounds.  Skin:    General: Skin is warm.  Neurological:     General: No focal deficit present.     Mental Status: She is alert.  Psychiatric:        Mood and Affect: Mood normal.        Behavior: Behavior normal.         Assessment And Plan:     1. Obesity, diabetes, and hypertension syndrome ([place])  Importance of dietary and medication compliance was stressed to the patient. Each diagnosis is a chronic condition. She is encouraged to increase her daily activity and to limit her salt intake. She will continue with current meds.   - Lipid panel - CMP14+EGFR - Hemoglobin A1c  2. Pure hypercholesterolemia  Chronic. She has not tolerated statins in the past. She agrees to try once weekly dosing. I will send rx pravastatin 23m once weekly to the pharmacy. She may also benefit from CoQ10. We will see how she tolerates this first.   3. Vitamin D deficiency  I WILL CHECK A VIT D LEVEL AND SUPPLEMENT AS NEEDED.  ALSO ENCOURAGED TO SPEND 15 MINUTES IN THE SUN DAILY.   - Vitamin D (25 hydroxy)  4. Class 2 severe obesity due to excess calories with serious comorbidity and body mass index (BMI) of 38.0 to 38.9 in adult ([facility]  Importance of achieving optimal weight to decrease risk of cardiovascular disease and cancers was discussed with the patient in full detail. She is encouraged to start slowly - start with 10 minutes  twice daily at least three to four days per week and to gradually build to 30 minutes five days weekly. She was given tips to incorporate more activity into her daily routine - take stairs when possible, park farther away from grocery stores, etc.    RMaximino Greenland MD    THE PATIENT IS ENCOURAGED TO PRACTICE SOCIAL DISTANCING DUE TO THE COVID-19 PANDEMIC.

## 2019-01-01 LAB — LIPID PANEL
Chol/HDL Ratio: 2.7 ratio (ref 0.0–4.4)
Cholesterol, Total: 192 mg/dL (ref 100–199)
HDL: 71 mg/dL (ref >39)
LDL Calculated: 104 mg/dL — ABNORMAL HIGH (ref 0–99)
Triglycerides: 87 mg/dL (ref 0–149)
VLDL Cholesterol Cal: 17 mg/dL (ref 5–40)

## 2019-01-01 LAB — CMP14+EGFR
ALT: 10 IU/L (ref 0–32)
AST: 14 IU/L (ref 0–40)
Albumin/Globulin Ratio: 1.6 (ref 1.2–2.2)
Albumin: 3.9 g/dL (ref 3.7–4.7)
Alkaline Phosphatase: 94 IU/L (ref 39–117)
BUN/Creatinine Ratio: 18 (ref 12–28)
BUN: 16 mg/dL (ref 8–27)
Bilirubin Total: <0.2 mg/dL (ref 0.0–1.2)
CO2: 24 mmol/L (ref 20–29)
Calcium: 9.2 mg/dL (ref 8.7–10.3)
Chloride: 106 mmol/L (ref 96–106)
Creatinine, Ser: 0.9 mg/dL (ref 0.57–1.00)
GFR calc Af Amer: 73 mL/min/{1.73_m2} (ref >59)
GFR calc non Af Amer: 63 mL/min/{1.73_m2} (ref >59)
Globulin, Total: 2.5 g/dL (ref 1.5–4.5)
Glucose: 98 mg/dL (ref 65–99)
Potassium: 4.7 mmol/L (ref 3.5–5.2)
Sodium: 143 mmol/L (ref 134–144)
Total Protein: 6.4 g/dL (ref 6.0–8.5)

## 2019-01-01 LAB — HEMOGLOBIN A1C
Est. average glucose Bld gHb Est-mCnc: 126 mg/dL
Hgb A1c MFr Bld: 6 % — ABNORMAL HIGH (ref 4.8–5.6)

## 2019-01-01 LAB — VITAMIN D 25 HYDROXY (VIT D DEFICIENCY, FRACTURES): Vit D, 25-Hydroxy: 40.6 ng/mL (ref 30.0–100.0)

## 2019-01-02 DIAGNOSIS — R0600 Dyspnea, unspecified: Secondary | ICD-10-CM | POA: Diagnosis not present

## 2019-01-02 DIAGNOSIS — I10 Essential (primary) hypertension: Secondary | ICD-10-CM | POA: Diagnosis not present

## 2019-01-02 DIAGNOSIS — G4733 Obstructive sleep apnea (adult) (pediatric): Secondary | ICD-10-CM | POA: Diagnosis not present

## 2019-01-02 DIAGNOSIS — R0602 Shortness of breath: Secondary | ICD-10-CM | POA: Diagnosis not present

## 2019-01-06 ENCOUNTER — Encounter: Payer: Self-pay | Admitting: Internal Medicine

## 2019-01-06 ENCOUNTER — Ambulatory Visit: Payer: Medicare HMO | Admitting: Internal Medicine

## 2019-02-02 DIAGNOSIS — G4733 Obstructive sleep apnea (adult) (pediatric): Secondary | ICD-10-CM | POA: Diagnosis not present

## 2019-02-02 DIAGNOSIS — I10 Essential (primary) hypertension: Secondary | ICD-10-CM | POA: Diagnosis not present

## 2019-02-02 DIAGNOSIS — R0602 Shortness of breath: Secondary | ICD-10-CM | POA: Diagnosis not present

## 2019-02-02 DIAGNOSIS — R0600 Dyspnea, unspecified: Secondary | ICD-10-CM | POA: Diagnosis not present

## 2019-02-03 DIAGNOSIS — R69 Illness, unspecified: Secondary | ICD-10-CM | POA: Diagnosis not present

## 2019-02-18 DIAGNOSIS — H10413 Chronic giant papillary conjunctivitis, bilateral: Secondary | ICD-10-CM | POA: Diagnosis not present

## 2019-02-18 DIAGNOSIS — H353131 Nonexudative age-related macular degeneration, bilateral, early dry stage: Secondary | ICD-10-CM | POA: Diagnosis not present

## 2019-02-18 DIAGNOSIS — H04123 Dry eye syndrome of bilateral lacrimal glands: Secondary | ICD-10-CM | POA: Diagnosis not present

## 2019-02-18 DIAGNOSIS — H25813 Combined forms of age-related cataract, bilateral: Secondary | ICD-10-CM | POA: Diagnosis not present

## 2019-02-18 DIAGNOSIS — E119 Type 2 diabetes mellitus without complications: Secondary | ICD-10-CM | POA: Diagnosis not present

## 2019-02-18 LAB — HM DIABETES EYE EXAM

## 2019-02-22 ENCOUNTER — Encounter: Payer: Self-pay | Admitting: Cardiology

## 2019-02-23 ENCOUNTER — Encounter: Payer: Self-pay | Admitting: Internal Medicine

## 2019-03-05 DIAGNOSIS — I10 Essential (primary) hypertension: Secondary | ICD-10-CM | POA: Diagnosis not present

## 2019-03-05 DIAGNOSIS — R0602 Shortness of breath: Secondary | ICD-10-CM | POA: Diagnosis not present

## 2019-03-05 DIAGNOSIS — R0600 Dyspnea, unspecified: Secondary | ICD-10-CM | POA: Diagnosis not present

## 2019-03-05 DIAGNOSIS — G4733 Obstructive sleep apnea (adult) (pediatric): Secondary | ICD-10-CM | POA: Diagnosis not present

## 2019-03-09 ENCOUNTER — Other Ambulatory Visit: Payer: Self-pay | Admitting: Orthopedic Surgery

## 2019-03-10 DIAGNOSIS — R300 Dysuria: Secondary | ICD-10-CM | POA: Diagnosis not present

## 2019-04-04 DIAGNOSIS — I10 Essential (primary) hypertension: Secondary | ICD-10-CM | POA: Diagnosis not present

## 2019-04-04 DIAGNOSIS — G4733 Obstructive sleep apnea (adult) (pediatric): Secondary | ICD-10-CM | POA: Diagnosis not present

## 2019-04-04 DIAGNOSIS — R0600 Dyspnea, unspecified: Secondary | ICD-10-CM | POA: Diagnosis not present

## 2019-04-04 DIAGNOSIS — R0602 Shortness of breath: Secondary | ICD-10-CM | POA: Diagnosis not present

## 2019-04-12 ENCOUNTER — Other Ambulatory Visit: Payer: Self-pay | Admitting: Orthopedic Surgery

## 2019-04-12 NOTE — Care Plan (Signed)
Spoke with patient prior to surgery. She plans to discharge to home with family and HHPT. Rolling walker and 3n1 ordered for home use. HH referral to [facility]. She will transition to OPPT after follow up with MD. Patient and MD in agreement with plan.  Choice offered.    Ladell Heads, [place]

## 2019-04-12 NOTE — Progress Notes (Signed)
PCP - [place] Chard Cardiologist - [place] LOV 11-04-18 (Pt to return prn)  Chest x-ray - 04-13-19   EKG - 04-13-19 Stress Test -  ECHO -  Cardiac Cath -   Sleep Study -  CPAP -   Fasting Blood Sugar - 103 Checks Blood Sugar ___1__ times a day  Blood Thinner Instructions: 81 mg to hold 5 days prior to surgery.  Aspirin Instructions: Last Dose:  Anesthesia review:   Patient denies shortness of breath, fever, cough and chest pain at PAT appointment   Patient verbalized understanding of instructions that were given to them at the PAT appointment. Patient was also instructed that they will need to review over the PAT instructions again at home before surgery.

## 2019-04-12 NOTE — Patient Instructions (Addendum)
DUE TO COVID-19 ONLY ONE VISITOR IS ALLOWED TO COME WITH YOU AND STAY IN THE WAITING ROOM ONLY DURING PRE OP AND PROCEDURE DAY OF SURGERY. THE 1 VISITOR MAY VISIT WITH YOU AFTER SURGERY IN YOUR PRIVATE ROOM DURING VISITING HOURS ONLY!  YOU NEED TO HAVE A COVID 19 TEST ON 04-15-19 @ 8:30 AM, THIS TEST MUST BE DONE BEFORE SURGERY, COME  [address], [place] , [address].  ([place]) ONCE YOUR COVID TEST IS COMPLETED, PLEASE BEGIN THE QUARANTINE INSTRUCTIONS AS OUTLINED IN YOUR HANDOUT.                [name]  04/12/2019   Your procedure is scheduled on: 04-19-19   Report to [facility] Main  Entrance    Report to Admitting at 7:05 AM     Call this number if you have problems the morning of surgery [phone]    Remember: NO SOLID FOOD AFTER MIDNIGHT THE NIGHT PRIOR TO SURGERY 6:35 AM. NOTHING BY MOUTH EXCEPT CLEAR LIQUIDS UNTIL . PLEASE FINISH ENSURE DRINK PER SURGEON ORDER  WHICH NEEDS TO BE COMPLETED AT 6:35 AM.   CLEAR LIQUID DIET   Foods Allowed                                                                     Foods Excluded  Coffee and tea, regular and decaf                             liquids that you cannot  Plain Jell-O any favor except red or purple                                           see through such as: Fruit ices (not with fruit pulp)                                     milk, soups, orange juice  Iced Popsicles                                    All solid food Carbonated beverages, regular and diet                                    Cranberry, grape and apple juices Sports drinks like Gatorade Lightly seasoned clear broth or consume(fat free) Sugar, honey syrup   _____________________________________________________________________       Take these medicines the morning of surgery with A SIP OF WATER:  Famotidine (Pepcid AC), Cetirizine (Zyrtec) and your nasal spray as needed.    BRUSH YOUR TEETH MORNING OF  SURGERY AND RINSE YOUR MOUTH OUT, NO CHEWING GUM CANDY OR MINTS  DO NOT TAKE ANY DIABETIC MEDICATIONS DAY OF YOUR SURGERY  You may not have any metal on your body including hair pins and              piercings     Do not wear jewelry, make-up, lotions, powders or perfumes, deodorant             Do not wear nail polish on your fingernails.  Do not shave  48 hours prior to surgery.                Do not bring valuables to the hospital. [facility] IS NOT             RESPONSIBLE   FOR VALUABLES.  Contacts, dentures or bridgework may not be worn into surgery.  Leave suitcase in the car. After surgery it may be brought to your room.     Special Instructions: N/A              Please read over the following fact sheets you were given: _____________________________________________________________________  How to Manage Your Diabetes Before and After Surgery  Why is it important to control my blood sugar before and after surgery? . Improving blood sugar levels before and after surgery helps healing and can limit problems. . A way of improving blood sugar control is eating a healthy diet by: o  Eating less sugar and carbohydrates o  Increasing activity/exercise o  Talking with your doctor about reaching your blood sugar goals . High blood sugars (greater than 180 mg/dL) can raise your risk of infections and slow your recovery, so you will need to focus on controlling your diabetes during the weeks before surgery. . Make sure that the doctor who takes care of your diabetes knows about your planned surgery including the date and location.  How do I manage my blood sugar before surgery? . Check your blood sugar at least 4 times a day, starting 2 days before surgery, to make sure that the level is not too high or low. o Check your blood sugar the morning of your surgery when you wake up and every 2 hours until you get to the Short Stay unit. . If your blood sugar  is less than 70 mg/dL, you will need to treat for low blood sugar: o Do not take insulin. o Treat a low blood sugar (less than 70 mg/dL) with  cup of clear juice (cranberry or apple), 4 glucose tablets, OR glucose gel. o Recheck blood sugar in 15 minutes after treatment (to make sure it is greater than 70 mg/dL). If your blood sugar is not greater than 70 mg/dL on recheck, call [phone] for further instructions. . Report your blood sugar to the short stay nurse when you get to Short Stay.  . If you are admitted to the hospital after surgery: o Your blood sugar will be checked by the staff and you will probably be given insulin after surgery (instead of oral diabetes medicines) to make sure you have good blood sugar levels. o The goal for blood sugar control after surgery is 80-180 mg/dL.   WHAT DO I DO ABOUT MY DIABETES MEDICATION?  Marland Kitchen Do not take oral diabetes medicines (pills) the morning of surgery.  . THE DAY BEFORE SURGERY, take your usual Metformin            Reviewed and Endorsed by [facility] Patient Education Committee, August 2015           [facility] - Preparing for Surgery Before surgery, you can play an  important role.  Because skin is not sterile, your skin needs to be as free of germs as possible.  You can reduce the number of germs on your skin by washing with CHG (chlorahexidine gluconate) soap before surgery.  CHG is an antiseptic cleaner which kills germs and bonds with the skin to continue killing germs even after washing. Please DO NOT use if you have an allergy to CHG or antibacterial soaps.  If your skin becomes reddened/irritated stop using the CHG and inform your nurse when you arrive at Short Stay. Do not shave (including legs and underarms) for at least 48 hours prior to the first CHG shower.  You may shave your face/neck. Please follow these instructions carefully:  1.  Shower with CHG Soap the night before surgery and the  morning of Surgery.  2.  If you  choose to wash your hair, wash your hair first as usual with your  normal  shampoo.  3.  After you shampoo, rinse your hair and body thoroughly to remove the  shampoo.                           4.  Use CHG as you would any other liquid soap.  You can apply chg directly  to the skin and wash                       Gently with a scrungie or clean washcloth.  5.  Apply the CHG Soap to your body ONLY FROM THE NECK DOWN.   Do not use on face/ open                           Wound or open sores. Avoid contact with eyes, ears mouth and genitals (private parts).                       Wash face,  Genitals (private parts) with your normal soap.             6.  Wash thoroughly, paying special attention to the area where your surgery  will be performed.  7.  Thoroughly rinse your body with warm water from the neck down.  8.  DO NOT shower/wash with your normal soap after using and rinsing off  the CHG Soap.                9.  Pat yourself dry with a clean towel.            10.  Wear clean pajamas.            11.  Place clean sheets on your bed the night of your first shower and do not  sleep with pets. Day of Surgery : Do not apply any lotions/deodorants the morning of surgery.  Please wear clean clothes to the hospital/surgery center.  FAILURE TO FOLLOW THESE INSTRUCTIONS MAY RESULT IN THE CANCELLATION OF YOUR SURGERY PATIENT SIGNATURE_________________________________  NURSE SIGNATURE__________________________________  ________________________________________________________________________   [name]  An incentive spirometer is a tool that can help keep your lungs clear and active. This tool measures how well you are filling your lungs with each breath. Taking long deep breaths may help reverse or decrease the chance of developing breathing (pulmonary) problems (especially infection) following:  A long period of time when you are unable to move or be active. BEFORE THE PROCEDURE  If  the spirometer includes an indicator to show your best effort, your nurse or respiratory therapist will set it to a desired goal.  If possible, sit up straight or lean slightly forward. Try not to slouch.  Hold the incentive spirometer in an upright position. INSTRUCTIONS FOR USE  1. Sit on the edge of your bed if possible, or sit up as far as you can in bed or on a chair. 2. Hold the incentive spirometer in an upright position. 3. Breathe out normally. 4. Place the mouthpiece in your mouth and seal your lips tightly around it. 5. Breathe in slowly and as deeply as possible, raising the piston or the ball toward the top of the column. 6. Hold your breath for 3-5 seconds or for as long as possible. Allow the piston or ball to fall to the bottom of the column. 7. Remove the mouthpiece from your mouth and breathe out normally. 8. Rest for a few seconds and repeat Steps 1 through 7 at least 10 times every 1-2 hours when you are awake. Take your time and take a few normal breaths between deep breaths. 9. The spirometer may include an indicator to show your best effort. Use the indicator as a goal to work toward during each repetition. 10. After each set of 10 deep breaths, practice coughing to be sure your lungs are clear. If you have an incision (the cut made at the time of surgery), support your incision when coughing by placing a pillow or rolled up towels firmly against it. Once you are able to get out of bed, walk around indoors and cough well. You may stop using the incentive spirometer when instructed by your caregiver.  RISKS AND COMPLICATIONS  Take your time so you do not get dizzy or light-headed.  If you are in pain, you may need to take or ask for pain medication before doing incentive spirometry. It is harder to take a deep breath if you are having pain. AFTER USE  Rest and breathe slowly and easily.  It can be helpful to keep track of a log of your progress. Your caregiver can  provide you with a simple table to help with this. If you are using the spirometer at home, follow these instructions: SEEK MEDICAL CARE IF:   You are having difficultly using the spirometer.  You have trouble using the spirometer as often as instructed.  Your pain medication is not giving enough relief while using the spirometer.  You develop fever of 100.5 F (38.1 C) or higher. SEEK IMMEDIATE MEDICAL CARE IF:   You cough up bloody sputum that had not been present before.  You develop fever of 102 F (38.9 C) or greater.  You develop worsening pain at or near the incision site. MAKE SURE YOU:   Understand these instructions.  Will watch your condition.  Will get help right away if you are not doing well or get worse. Document Released: 10/21/2006 Document Revised: 09/02/2011 Document Reviewed: 12/22/2006 ExitCare Patient Information 2014 ExitCare, [place].   ________________________________________________________________________  WHAT IS A BLOOD TRANSFUSION? Blood Transfusion Information  A transfusion is the replacement of blood or some of its parts. Blood is made up of multiple cells which provide different functions.  Red blood cells carry oxygen and are used for blood loss replacement.  White blood cells fight against infection.  Platelets control bleeding.  Plasma helps clot blood.  Other blood products are available for specialized needs, such as hemophilia or other clotting  disorders. BEFORE THE TRANSFUSION  Who gives blood for transfusions?   Healthy volunteers who are fully evaluated to make sure their blood is safe. This is blood bank blood. Transfusion therapy is the safest it has ever been in the practice of medicine. Before blood is taken from a donor, a complete history is taken to make sure that person has no history of diseases nor engages in risky social behavior (examples are intravenous drug use or sexual activity with multiple partners). The  donor's travel history is screened to minimize risk of transmitting infections, such as malaria. The donated blood is tested for signs of infectious diseases, such as HIV and hepatitis. The blood is then tested to be sure it is compatible with you in order to minimize the chance of a transfusion reaction. If you or a relative donates blood, this is often done in anticipation of surgery and is not appropriate for emergency situations. It takes many days to process the donated blood. RISKS AND COMPLICATIONS Although transfusion therapy is very safe and saves many lives, the main dangers of transfusion include:   Getting an infectious disease.  Developing a transfusion reaction. This is an allergic reaction to something in the blood you were given. Every precaution is taken to prevent this. The decision to have a blood transfusion has been considered carefully by your caregiver before blood is given. Blood is not given unless the benefits outweigh the risks. AFTER THE TRANSFUSION  Right after receiving a blood transfusion, you will usually feel much better and more energetic. This is especially true if your red blood cells have gotten low (anemic). The transfusion raises the level of the red blood cells which carry oxygen, and this usually causes an energy increase.  The nurse administering the transfusion will monitor you carefully for complications. HOME CARE INSTRUCTIONS  No special instructions are needed after a transfusion. You may find your energy is better. Speak with your caregiver about any limitations on activity for underlying diseases you may have. SEEK MEDICAL CARE IF:   Your condition is not improving after your transfusion.  You develop redness or irritation at the intravenous (IV) site. SEEK IMMEDIATE MEDICAL CARE IF:  Any of the following symptoms occur over the next 12 hours:  Shaking chills.  You have a temperature by mouth above 102 F (38.9 C), not controlled by  medicine.  Chest, back, or muscle pain.  People around you feel you are not acting correctly or are confused.  Shortness of breath or difficulty breathing.  Dizziness and fainting.  You get a rash or develop hives.  You have a decrease in urine output.  Your urine turns a dark color or changes to pink, red, or brown. Any of the following symptoms occur over the next 10 days:  You have a temperature by mouth above 102 F (38.9 C), not controlled by medicine.  Shortness of breath.  Weakness after normal activity.  The white part of the eye turns yellow (jaundice).  You have a decrease in the amount of urine or are urinating less often.  Your urine turns a dark color or changes to pink, red, or brown. Document Released: 06/07/2000 Document Revised: 09/02/2011 Document Reviewed: 01/25/2008 [facility] Patient Information 2014 [place], [place].  _______________________________________________________________________

## 2019-04-13 ENCOUNTER — Other Ambulatory Visit: Payer: Self-pay

## 2019-04-13 ENCOUNTER — Encounter (HOSPITAL_COMMUNITY)
Admission: RE | Admit: 2019-04-13 | Discharge: 2019-04-13 | Disposition: A | Discharge status: Home / Self Care | Payer: Medicare HMO | Source: Ambulatory Visit | Attending: Orthopedic Surgery | Admitting: Orthopedic Surgery

## 2019-04-13 ENCOUNTER — Ambulatory Visit (HOSPITAL_COMMUNITY)
Admission: RE | Admit: 2019-04-13 | Discharge: 2019-04-13 | Disposition: A | Discharge status: Home / Self Care | Payer: Medicare HMO | Source: Ambulatory Visit | Attending: Orthopedic Surgery | Admitting: Orthopedic Surgery

## 2019-04-13 ENCOUNTER — Encounter (HOSPITAL_COMMUNITY): Payer: Self-pay

## 2019-04-13 DIAGNOSIS — Z01818 Encounter for other preprocedural examination: Secondary | ICD-10-CM | POA: Diagnosis not present

## 2019-04-13 DIAGNOSIS — E119 Type 2 diabetes mellitus without complications: Secondary | ICD-10-CM | POA: Insufficient documentation

## 2019-04-13 DIAGNOSIS — G4733 Obstructive sleep apnea (adult) (pediatric): Secondary | ICD-10-CM | POA: Diagnosis not present

## 2019-04-13 DIAGNOSIS — E785 Hyperlipidemia, unspecified: Secondary | ICD-10-CM | POA: Diagnosis not present

## 2019-04-13 DIAGNOSIS — I10 Essential (primary) hypertension: Secondary | ICD-10-CM | POA: Diagnosis not present

## 2019-04-13 DIAGNOSIS — Z79899 Other long term (current) drug therapy: Secondary | ICD-10-CM | POA: Diagnosis not present

## 2019-04-13 DIAGNOSIS — Z7984 Long term (current) use of oral hypoglycemic drugs: Secondary | ICD-10-CM | POA: Diagnosis not present

## 2019-04-13 DIAGNOSIS — M1711 Unilateral primary osteoarthritis, right knee: Secondary | ICD-10-CM | POA: Diagnosis not present

## 2019-04-13 DIAGNOSIS — M25861 Other specified joint disorders, right knee: Secondary | ICD-10-CM | POA: Diagnosis not present

## 2019-04-13 HISTORY — DX: Sleep apnea, unspecified: G47.30

## 2019-04-13 HISTORY — DX: Other complications of anesthesia, initial encounter: T88.59XA

## 2019-04-13 HISTORY — DX: Essential (primary) hypertension: I10

## 2019-04-13 LAB — BASIC METABOLIC PANEL
Anion gap: 8 (ref 5–15)
BUN: 11 mg/dL (ref 8–23)
CO2: 29 mmol/L (ref 22–32)
Calcium: 9.3 mg/dL (ref 8.9–10.3)
Chloride: 104 mmol/L (ref 98–111)
Creatinine, Ser: 0.78 mg/dL (ref 0.44–1.00)
GFR calc Af Amer: >60 mL/min (ref >60)
GFR calc non Af Amer: >60 mL/min (ref >60)
Glucose, Bld: 119 mg/dL — ABNORMAL HIGH (ref 70–99)
Potassium: 3.9 mmol/L (ref 3.5–5.1)
Sodium: 141 mmol/L (ref 135–145)

## 2019-04-13 LAB — URINALYSIS, ROUTINE W REFLEX MICROSCOPIC
Bilirubin Urine: NEGATIVE
Glucose, UA: NEGATIVE mg/dL
Ketones, ur: NEGATIVE mg/dL
Nitrite: NEGATIVE
Protein, ur: NEGATIVE mg/dL
Specific Gravity, Urine: 1.015 (ref 1.005–1.030)
pH: 5 (ref 5.0–8.0)

## 2019-04-13 LAB — ABO/RH: ABO/RH(D): O POS

## 2019-04-13 LAB — CBC WITH DIFFERENTIAL/PLATELET
Abs Immature Granulocytes: 0.01 10*3/uL (ref 0.00–0.07)
Basophils Absolute: 0 10*3/uL (ref 0.0–0.1)
Basophils Relative: 0 %
Eosinophils Absolute: 0.1 10*3/uL (ref 0.0–0.5)
Eosinophils Relative: 2 %
HCT: 41.9 % (ref 36.0–46.0)
Hemoglobin: 12.9 g/dL (ref 12.0–15.0)
Immature Granulocytes: 0 %
Lymphocytes Relative: 27 %
Lymphs Abs: 1.6 10*3/uL (ref 0.7–4.0)
MCH: 28.5 pg (ref 26.0–34.0)
MCHC: 30.8 g/dL (ref 30.0–36.0)
MCV: 92.7 fL (ref 80.0–100.0)
Monocytes Absolute: 0.4 10*3/uL (ref 0.1–1.0)
Monocytes Relative: 8 %
Neutro Abs: 3.6 10*3/uL (ref 1.7–7.7)
Neutrophils Relative %: 63 %
Platelets: 275 10*3/uL (ref 150–400)
RBC: 4.52 MIL/uL (ref 3.87–5.11)
RDW: 14.4 % (ref 11.5–15.5)
WBC: 5.7 10*3/uL (ref 4.0–10.5)
nRBC: 0 % (ref 0.0–0.2)

## 2019-04-13 LAB — PROTIME-INR
INR: 1 (ref 0.8–1.2)
Prothrombin Time: 13.3 seconds (ref 11.4–15.2)

## 2019-04-13 LAB — APTT: aPTT: 33 seconds (ref 24–36)

## 2019-04-13 LAB — HEMOGLOBIN A1C
Hgb A1c MFr Bld: 6.1 % — ABNORMAL HIGH (ref 4.8–5.6)
Mean Plasma Glucose: 128.37 mg/dL

## 2019-04-13 LAB — GLUCOSE, CAPILLARY: Glucose-Capillary: 103 mg/dL — ABNORMAL HIGH (ref 70–99)

## 2019-04-13 LAB — SURGICAL PCR SCREEN
MRSA, PCR: NEGATIVE
Staphylococcus aureus: NEGATIVE

## 2019-04-15 ENCOUNTER — Other Ambulatory Visit (HOSPITAL_COMMUNITY)
Admission: RE | Admit: 2019-04-15 | Discharge: 2019-04-15 | Disposition: A | Discharge status: Home / Self Care | Payer: Medicare HMO | Source: Ambulatory Visit | Attending: Orthopedic Surgery | Admitting: Orthopedic Surgery

## 2019-04-15 DIAGNOSIS — Z01812 Encounter for preprocedural laboratory examination: Secondary | ICD-10-CM | POA: Insufficient documentation

## 2019-04-15 DIAGNOSIS — Z20828 Contact with and (suspected) exposure to other viral communicable diseases: Secondary | ICD-10-CM | POA: Diagnosis not present

## 2019-04-16 DIAGNOSIS — M1711 Unilateral primary osteoarthritis, right knee: Secondary | ICD-10-CM | POA: Diagnosis present

## 2019-04-16 NOTE — H&P (Signed)
TOTAL KNEE ADMISSION H&P  Patient is being admitted for right total knee arthroplasty.  Subjective:  Chief Complaint:right knee pain.  HPI: [name], 73 y.o. female, has a history of pain and functional disability in the right knee due to arthritis and has failed non-surgical conservative treatments for greater than 12 weeks to includeNSAID's and/or analgesics, corticosteriod injections, use of assistive devices, weight reduction as appropriate and activity modification.  Onset of symptoms was gradual, starting 4 years ago with gradually worsening course since that time. The patient noted no past surgery on the right knee(s).  Patient currently rates pain in the right knee(s) at 10 out of 10 with activity. Patient has night pain, worsening of pain with activity and weight bearing, pain that interferes with activities of daily living, pain with passive range of motion, crepitus and joint swelling.  Patient has evidence of periarticular osteophytes, joint subluxation and joint space narrowing by imaging studies.   There is no active infection.  Patient Active Problem List   Diagnosis Date Noted  . Obesity, diabetes, and hypertension syndrome (HCC) 06/08/2018  . Bradycardia 06/08/2018  . Chronic fatigue 06/08/2018  . PVC (premature ventricular contraction) 08/03/2017  . Costochondritis 08/02/2017  . DOE (dyspnea on exertion) 08/01/2017  . Chest pain 05/28/2016  . Essential hypertension 05/28/2016  . Diabetes mellitus type 2 in obese (HCC) 05/28/2016  . UPPER RESPIRATORY INFECTION, VIRAL 03/22/2009  . ALLERGIC RHINITIS CAUSE UNSPECIFIED 11/04/2008  . Hyperlipidemia 04/11/2008  . COUGH 04/11/2008  . OBSTRUCTIVE SLEEP APNEA 03/21/2008  . CARPAL TUNNEL RELEASE, BILATERAL, HX OF 03/21/2008   Past Medical History:  Diagnosis Date  . Bladder infection   . Carpal tunnel syndrome   . Complication of anesthesia    Difficult to arouse  . Diabetes mellitus   . Hypertension   . Kidney  stone   . Kidney stones   . Sleep apnea     Past Surgical History:  Procedure Laterality Date  . ABDOMINAL HYSTERECTOMY     partial  . APPENDECTOMY    . CARPAL TUNNEL RELEASE Bilateral 1980  . HERNIA REPAIR    . TONSILLECTOMY      No current facility-administered medications for this encounter.    Current Outpatient Medications  Medication Sig Dispense Refill Last Dose  . aspirin EC 81 MG tablet Take 81 mg by mouth every Monday, Wednesday, and Friday. At 10 in the morning.     . cetirizine (ZYRTEC) 10 MG tablet Take 10 mg by mouth daily as needed for allergies.      . Cholecalciferol (VITAMIN D3) 50 MCG (2000 UT) TABS Take 2,000 Units by mouth daily at 12 noon.     . Coenzyme Q10 (COQ10) 100 MG CAPS Take 100 mg by mouth daily with lunch.      . famotidine (PEPCID AC MAXIMUM STRENGTH) 20 MG tablet Take 20 mg by mouth 2 (two) times daily.     . fluticasone (FLONASE) 50 MCG/ACT nasal spray Place 1 spray into both nostrils daily. (Patient taking differently: Place 1 spray into both nostrils daily as needed for allergies. ) 16 g 2   . furosemide (LASIX) 20 MG tablet Take 20 mg by mouth daily as needed (fluid).      . hydrochlorothiazide (MICROZIDE) 12.5 MG capsule Take 12.5 mg by mouth daily as needed (excess fluid/swelling).      Marland Kitchen. losartan (COZAAR) 25 MG tablet Take 25 mg by mouth 2 (two) times a week. Take one tablet (25 mg) by mouth  twice weekly - Tuesdays and Thursdays     . meloxicam (MOBIC) 15 MG tablet TAKE 1 TABLET BY MOUTH ONCE DAILY AS NEEDED FOR HIP PAIN (Patient taking differently: Take 15 mg by mouth daily as needed (hip pain.). TAKE 1 TABLET BY MOUTH ONCE DAILY AS NEEDED FOR HIP PAIN) 90 tablet 2   . metFORMIN (GLUCOPHAGE) 500 MG tablet Take 500 mg by mouth 2 (two) times daily with a meal.     . potassium chloride SA (K-DUR) 20 MEQ tablet Take 1 tablet (20 mEq total) by mouth daily as needed (with each dose of hydrochlorothiazide). (Patient taking differently: Take 20 mEq by  mouth every morning. ) 90 tablet 2   . pravastatin (PRAVACHOL) 80 MG tablet One tab po qweekly on Mondays (Patient taking differently: Take 80 mg by mouth every Monday. One tab po qweekly on Mondays) 12 tablet 2   . Vitamin D, Ergocalciferol, (DRISDOL) 1.25 MG (50000 UT) CAPS capsule TAKE 1 CAPSULE BY MOUTH ON TUESDAYS AND FRIDAYS (Patient not taking: Reported on 04/07/2019) 24 capsule 0 Not Taking at Unknown time   Allergies  Allergen Reactions  . Sulfa Antibiotics Itching  . Sulfonamide Derivatives Itching  . Tetanus Toxoids Swelling    Arm swelled at injection site    Social History   Tobacco Use  . Smoking status: Never Smoker  . Smokeless tobacco: Never Used  Substance Use Topics  . Alcohol use: No    Family History  Problem Relation Age of Onset  . Heart attack Father 13  . Stroke Father 57  . Diabetes Father   . Diabetes Brother   . Diabetes Brother   . Breast cancer Sister 66  . Diabetes Mother   . Hypertension Mother      Review of Systems  Constitutional: Negative.   HENT: Positive for sinus pain.   Eyes: Negative.   Respiratory: Positive for shortness of breath.   Cardiovascular: Positive for palpitations.       Htn  Gastrointestinal: Negative.   Genitourinary: Negative.   Musculoskeletal: Positive for joint pain and myalgias.  Skin: Negative.   Neurological: Negative.   Endo/Heme/Allergies: Negative.   Psychiatric/Behavioral: Negative.     Objective:  Physical Exam  Constitutional: She is oriented to person, place, and time. She appears well-developed and well-nourished.  HENT:  Head: Normocephalic and atraumatic.  Eyes: Pupils are equal, round, and reactive to light.  Neck: Normal range of motion. Neck supple.  Cardiovascular: Intact distal pulses.  Respiratory: Effort normal.  Musculoskeletal:        General: Tenderness present.     Comments: the patient has a range from roughly 5 to 100 on the right and 0-100 on the left.  She has  tenderness over the medial lateral joint lines.  No instability.  Obvious crepitance with range of motion.  Calves are soft and nontender.  Neurological: She is alert and oriented to person, place, and time.  Skin: Skin is warm and dry.  Psychiatric: She has a normal mood and affect. Her behavior is normal. Judgment and thought content normal.    Vital signs in last 24 hours:    Labs:   Estimated body mass index is 38.09 kg/m as calculated from the following:   Height as of 04/13/19: 5\' 3"  (1.6 m).   Weight as of 04/13/19: 97.5 kg.   Imaging Review Plain radiographs demonstrate  bilateral AP weightbearing, bilateral Rosenberg, lateral sunrise views of bilateral knees are taken and reviewed in office today.  This shows bilateral end-stage arthritis bone-on-bone medial compartment with periarticular osteophyte formation.    Assessment/Plan:  End stage arthritis, right knee   The patient history, physical examination, clinical judgment of the provider and imaging studies are consistent with end stage degenerative joint disease of the right knee(s) and total knee arthroplasty is deemed medically necessary. The treatment options including medical management, injection therapy arthroscopy and arthroplasty were discussed at length. The risks and benefits of total knee arthroplasty were presented and reviewed. The risks due to aseptic loosening, infection, stiffness, patella tracking problems, thromboembolic complications and other imponderables were discussed. The patient acknowledged the explanation, agreed to proceed with the plan and consent was signed. Patient is being admitted for inpatient treatment for surgery, pain control, PT, OT, prophylactic antibiotics, VTE prophylaxis, progressive ambulation and ADL's and discharge planning. The patient is planning to be discharged home with home health services     Patient's anticipated LOS is less than 2 midnights, meeting these  requirements: - Younger than 27 - Lives within 1 hour of care - Has a competent adult at home to recover with post-op recover - NO history of  - Chronic pain requiring opiods  - Diabetes  - Coronary Artery Disease  - Heart failure  - Heart attack  - Stroke  - DVT/VTE  - Cardiac arrhythmia  - Respiratory Failure/COPD  - Renal failure  - Anemia  - Advanced Liver disease

## 2019-04-18 LAB — NOVEL CORONAVIRUS, NAA (HOSP ORDER, SEND-OUT TO REF LAB; TAT 18-24 HRS): SARS-CoV-2, NAA: NOT DETECTED

## 2019-04-18 MED ORDER — TRANEXAMIC ACID 1000 MG/10ML IV SOLN
2000.0000 mg | INTRAVENOUS | Status: DC
  Filled 2019-04-18: qty 20

## 2019-04-18 MED ORDER — BUPIVACAINE LIPOSOME 1.3 % IJ SUSP
20.0000 mL | Freq: Once | INTRAMUSCULAR | Status: DC
  Filled 2019-04-18: qty 20

## 2019-04-19 ENCOUNTER — Encounter (HOSPITAL_COMMUNITY)
Admission: AD | Disposition: A | Discharge status: Home Health | Payer: Self-pay | Source: Home / Self Care | Attending: Orthopedic Surgery

## 2019-04-19 ENCOUNTER — Inpatient Hospital Stay (HOSPITAL_COMMUNITY)
Admission: AD | Admit: 2019-04-19 | Discharge: 2019-04-23 | DRG: 470 | Disposition: A | Discharge status: Home Health | Payer: Medicare HMO | Attending: Orthopedic Surgery | Admitting: Orthopedic Surgery

## 2019-04-19 ENCOUNTER — Other Ambulatory Visit: Payer: Self-pay | Admitting: Orthopedic Surgery

## 2019-04-19 ENCOUNTER — Other Ambulatory Visit: Payer: Self-pay

## 2019-04-19 ENCOUNTER — Ambulatory Visit (HOSPITAL_COMMUNITY): Payer: Medicare HMO | Admitting: Anesthesiology

## 2019-04-19 ENCOUNTER — Encounter (HOSPITAL_COMMUNITY): Payer: Self-pay | Admitting: Anesthesiology

## 2019-04-19 DIAGNOSIS — I110 Hypertensive heart disease with heart failure: Secondary | ICD-10-CM | POA: Diagnosis present

## 2019-04-19 DIAGNOSIS — Z803 Family history of malignant neoplasm of breast: Secondary | ICD-10-CM

## 2019-04-19 DIAGNOSIS — Z8249 Family history of ischemic heart disease and other diseases of the circulatory system: Secondary | ICD-10-CM

## 2019-04-19 DIAGNOSIS — Z7982 Long term (current) use of aspirin: Secondary | ICD-10-CM | POA: Diagnosis not present

## 2019-04-19 DIAGNOSIS — Z887 Allergy status to serum and vaccine status: Secondary | ICD-10-CM | POA: Diagnosis not present

## 2019-04-19 DIAGNOSIS — M1711 Unilateral primary osteoarthritis, right knee: Secondary | ICD-10-CM | POA: Diagnosis not present

## 2019-04-19 DIAGNOSIS — R5382 Chronic fatigue, unspecified: Secondary | ICD-10-CM | POA: Diagnosis present

## 2019-04-19 DIAGNOSIS — D62 Acute posthemorrhagic anemia: Secondary | ICD-10-CM | POA: Diagnosis not present

## 2019-04-19 DIAGNOSIS — G4733 Obstructive sleep apnea (adult) (pediatric): Secondary | ICD-10-CM | POA: Diagnosis not present

## 2019-04-19 DIAGNOSIS — Z833 Family history of diabetes mellitus: Secondary | ICD-10-CM | POA: Diagnosis not present

## 2019-04-19 DIAGNOSIS — Z882 Allergy status to sulfonamides status: Secondary | ICD-10-CM

## 2019-04-19 DIAGNOSIS — G8918 Other acute postprocedural pain: Secondary | ICD-10-CM | POA: Diagnosis not present

## 2019-04-19 DIAGNOSIS — E119 Type 2 diabetes mellitus without complications: Secondary | ICD-10-CM | POA: Diagnosis not present

## 2019-04-19 DIAGNOSIS — E785 Hyperlipidemia, unspecified: Secondary | ICD-10-CM | POA: Diagnosis present

## 2019-04-19 DIAGNOSIS — Z823 Family history of stroke: Secondary | ICD-10-CM

## 2019-04-19 DIAGNOSIS — Z96651 Presence of right artificial knee joint: Secondary | ICD-10-CM

## 2019-04-19 DIAGNOSIS — Z7984 Long term (current) use of oral hypoglycemic drugs: Secondary | ICD-10-CM | POA: Diagnosis not present

## 2019-04-19 DIAGNOSIS — I509 Heart failure, unspecified: Secondary | ICD-10-CM | POA: Diagnosis present

## 2019-04-19 DIAGNOSIS — Z79899 Other long term (current) drug therapy: Secondary | ICD-10-CM

## 2019-04-19 DIAGNOSIS — Z6838 Body mass index (BMI) 38.0-38.9, adult: Secondary | ICD-10-CM | POA: Diagnosis not present

## 2019-04-19 DIAGNOSIS — I10 Essential (primary) hypertension: Secondary | ICD-10-CM | POA: Diagnosis not present

## 2019-04-19 HISTORY — PX: TOTAL KNEE ARTHROPLASTY: SHX125

## 2019-04-19 LAB — GLUCOSE, CAPILLARY
Glucose-Capillary: 107 mg/dL — ABNORMAL HIGH (ref 70–99)
Glucose-Capillary: 118 mg/dL — ABNORMAL HIGH (ref 70–99)
Glucose-Capillary: 145 mg/dL — ABNORMAL HIGH (ref 70–99)
Glucose-Capillary: 157 mg/dL — ABNORMAL HIGH (ref 70–99)

## 2019-04-19 LAB — TYPE AND SCREEN
ABO/RH(D): O POS
Antibody Screen: NEGATIVE

## 2019-04-19 LAB — HEMOGLOBIN A1C
Hgb A1c MFr Bld: 6.1 % — ABNORMAL HIGH (ref 4.8–5.6)
Mean Plasma Glucose: 128.37 mg/dL

## 2019-04-19 SURGERY — ARTHROPLASTY, KNEE, TOTAL
Anesthesia: Spinal | Site: Knee | Laterality: Right

## 2019-04-19 MED ORDER — POLYETHYLENE GLYCOL 3350 17 G PO PACK
17.0000 g | PACK | Freq: Every day | ORAL | Status: DC | PRN
  Administered 2019-04-21 – 2019-04-22 (×2): 17 g via ORAL
  Filled 2019-04-19 (×2): qty 1

## 2019-04-19 MED ORDER — POTASSIUM CHLORIDE CRYS ER 20 MEQ PO TBCR: 20.0000 meq | EXTENDED_RELEASE_TABLET | Freq: Every day | ORAL | Status: DC | PRN

## 2019-04-19 MED ORDER — ONDANSETRON HCL 4 MG/2ML IJ SOLN
INTRAMUSCULAR | Status: DC | PRN
  Administered 2019-04-19: 4 mg via INTRAVENOUS

## 2019-04-19 MED ORDER — ROPIVACAINE HCL 7.5 MG/ML IJ SOLN
INTRAMUSCULAR | Status: DC | PRN
  Administered 2019-04-19: 20 mL via PERINEURAL

## 2019-04-19 MED ORDER — OXYCODONE HCL 5 MG PO TABS
5.0000 mg | ORAL_TABLET | ORAL | Status: DC | PRN
  Administered 2019-04-19 – 2019-04-20 (×2): 5 mg via ORAL
  Administered 2019-04-20 (×2): 10 mg via ORAL
  Administered 2019-04-21 (×2): 5 mg via ORAL
  Administered 2019-04-21 – 2019-04-22 (×5): 10 mg via ORAL
  Administered 2019-04-23: 5 mg via ORAL
  Administered 2019-04-23: 10 mg via ORAL
  Administered 2019-04-23: 5 mg via ORAL
  Administered 2019-04-23: 10 mg via ORAL
  Filled 2019-04-19 (×2): qty 2
  Filled 2019-04-19 (×4): qty 1
  Filled 2019-04-19 (×2): qty 2
  Filled 2019-04-19: qty 1
  Filled 2019-04-19 (×6): qty 2

## 2019-04-19 MED ORDER — BUPIVACAINE LIPOSOME 1.3 % IJ SUSP
INTRAMUSCULAR | Status: DC | PRN
  Administered 2019-04-19: 20 mL

## 2019-04-19 MED ORDER — SODIUM CHLORIDE (PF) 0.9 % IJ SOLN
INTRAMUSCULAR | Status: DC | PRN
  Administered 2019-04-19: 70 mL

## 2019-04-19 MED ORDER — PHENOL 1.4 % MT LIQD: 1.0000 | OROMUCOSAL | Status: DC | PRN

## 2019-04-19 MED ORDER — POVIDONE-IODINE 10 % EX SWAB: 2.0000 "application " | Freq: Once | CUTANEOUS | Status: DC

## 2019-04-19 MED ORDER — ASPIRIN EC 81 MG PO TBEC: 81.0000 mg | DELAYED_RELEASE_TABLET | Freq: Two times a day (BID) | ORAL | 0 refills | Status: DC

## 2019-04-19 MED ORDER — METOCLOPRAMIDE HCL 5 MG PO TABS: 5.0000 mg | ORAL_TABLET | Freq: Three times a day (TID) | ORAL | Status: DC | PRN

## 2019-04-19 MED ORDER — OXYCODONE-ACETAMINOPHEN 5-325 MG PO TABS: 1.0000 | ORAL_TABLET | ORAL | 0 refills | Status: DC | PRN

## 2019-04-19 MED ORDER — PANTOPRAZOLE SODIUM 40 MG PO TBEC
40.0000 mg | DELAYED_RELEASE_TABLET | Freq: Every day | ORAL | Status: DC
  Administered 2019-04-19 – 2019-04-23 (×5): 40 mg via ORAL
  Filled 2019-04-19 (×5): qty 1

## 2019-04-19 MED ORDER — MEPERIDINE HCL 50 MG/ML IJ SOLN: 6.2500 mg | INTRAMUSCULAR | Status: DC | PRN

## 2019-04-19 MED ORDER — BISACODYL 5 MG PO TBEC: 5.0000 mg | DELAYED_RELEASE_TABLET | Freq: Every day | ORAL | Status: DC | PRN

## 2019-04-19 MED ORDER — HYDROMORPHONE HCL 1 MG/ML IJ SOLN
INTRAMUSCULAR | Status: AC
  Administered 2019-04-19: 0.5 mg via INTRAVENOUS
  Filled 2019-04-19: qty 1

## 2019-04-19 MED ORDER — METOCLOPRAMIDE HCL 5 MG/ML IJ SOLN: 5.0000 mg | Freq: Three times a day (TID) | INTRAMUSCULAR | Status: DC | PRN

## 2019-04-19 MED ORDER — METFORMIN HCL 500 MG PO TABS
500.0000 mg | ORAL_TABLET | Freq: Two times a day (BID) | ORAL | Status: DC
  Administered 2019-04-19 – 2019-04-22 (×7): 500 mg via ORAL
  Filled 2019-04-19 (×8): qty 1

## 2019-04-19 MED ORDER — HYDROMORPHONE HCL 1 MG/ML IJ SOLN
0.2500 mg | INTRAMUSCULAR | Status: DC | PRN
  Administered 2019-04-19 (×4): 0.5 mg via INTRAVENOUS

## 2019-04-19 MED ORDER — TRANEXAMIC ACID 1000 MG/10ML IV SOLN
INTRAVENOUS | Status: DC | PRN
  Administered 2019-04-19: 2000 mg via TOPICAL

## 2019-04-19 MED ORDER — TIZANIDINE HCL 2 MG PO TABS: 2.0000 mg | ORAL_TABLET | Freq: Four times a day (QID) | ORAL | 0 refills | Status: DC | PRN

## 2019-04-19 MED ORDER — FENTANYL CITRATE (PF) 100 MCG/2ML IJ SOLN
INTRAMUSCULAR | Status: AC
  Filled 2019-04-19: qty 2

## 2019-04-19 MED ORDER — ONDANSETRON HCL 4 MG/2ML IJ SOLN
4.0000 mg | Freq: Four times a day (QID) | INTRAMUSCULAR | Status: DC | PRN
  Administered 2019-04-19: 19:00:00 4 mg via INTRAVENOUS
  Filled 2019-04-19: qty 2

## 2019-04-19 MED ORDER — MENTHOL 3 MG MT LOZG: 1.0000 | LOZENGE | OROMUCOSAL | Status: DC | PRN

## 2019-04-19 MED ORDER — LORATADINE 10 MG PO TABS
10.0000 mg | ORAL_TABLET | Freq: Every day | ORAL | Status: DC
  Administered 2019-04-19 – 2019-04-22 (×4): 10 mg via ORAL
  Filled 2019-04-19 (×5): qty 1

## 2019-04-19 MED ORDER — SODIUM CHLORIDE (PF) 0.9 % IJ SOLN
INTRAMUSCULAR | Status: AC
  Filled 2019-04-19: qty 100

## 2019-04-19 MED ORDER — DIPHENHYDRAMINE HCL 12.5 MG/5ML PO ELIX: 12.5000 mg | ORAL_SOLUTION | ORAL | Status: DC | PRN

## 2019-04-19 MED ORDER — KCL IN DEXTROSE-NACL 20-5-0.45 MEQ/L-%-% IV SOLN
INTRAVENOUS | Status: DC
  Administered 2019-04-19: 15:00:00 via INTRAVENOUS
  Filled 2019-04-19 (×2): qty 1000

## 2019-04-19 MED ORDER — CEFAZOLIN SODIUM-DEXTROSE 2-4 GM/100ML-% IV SOLN
2.0000 g | INTRAVENOUS | Status: AC
  Administered 2019-04-19: 2 g via INTRAVENOUS
  Filled 2019-04-19: qty 100

## 2019-04-19 MED ORDER — DEXAMETHASONE SODIUM PHOSPHATE 10 MG/ML IJ SOLN
INTRAMUSCULAR | Status: DC | PRN
  Administered 2019-04-19: 4 mg via INTRAVENOUS

## 2019-04-19 MED ORDER — EPHEDRINE 5 MG/ML INJ
INTRAVENOUS | Status: AC
  Filled 2019-04-19: qty 10

## 2019-04-19 MED ORDER — ONDANSETRON HCL 4 MG/2ML IJ SOLN: 4.0000 mg | Freq: Once | INTRAMUSCULAR | Status: DC | PRN

## 2019-04-19 MED ORDER — FLUTICASONE PROPIONATE 50 MCG/ACT NA SUSP
1.0000 | Freq: Every day | NASAL | Status: DC | PRN
  Filled 2019-04-19: qty 16

## 2019-04-19 MED ORDER — ONDANSETRON HCL 4 MG PO TABS: 4.0000 mg | ORAL_TABLET | Freq: Four times a day (QID) | ORAL | Status: DC | PRN

## 2019-04-19 MED ORDER — SODIUM CHLORIDE 0.9 % IR SOLN
Status: DC | PRN
  Administered 2019-04-19: 1000 mL

## 2019-04-19 MED ORDER — ACETAMINOPHEN 325 MG PO TABS
325.0000 mg | ORAL_TABLET | Freq: Four times a day (QID) | ORAL | Status: DC | PRN
  Administered 2019-04-22 – 2019-04-23 (×2): 650 mg via ORAL
  Filled 2019-04-19 (×2): qty 2

## 2019-04-19 MED ORDER — FENTANYL CITRATE (PF) 100 MCG/2ML IJ SOLN
INTRAMUSCULAR | Status: DC | PRN
  Administered 2019-04-19 (×2): 25 ug via INTRAVENOUS
  Administered 2019-04-19: 50 ug via INTRAVENOUS
  Administered 2019-04-19 (×2): 25 ug via INTRAVENOUS
  Administered 2019-04-19: 50 ug via INTRAVENOUS

## 2019-04-19 MED ORDER — METHOCARBAMOL 500 MG IVPB - SIMPLE MED
INTRAVENOUS | Status: AC
  Administered 2019-04-19: 500 mg via INTRAVENOUS
  Filled 2019-04-19: qty 50

## 2019-04-19 MED ORDER — GABAPENTIN 300 MG PO CAPS
300.0000 mg | ORAL_CAPSULE | Freq: Three times a day (TID) | ORAL | Status: DC
  Administered 2019-04-19 – 2019-04-23 (×12): 300 mg via ORAL
  Filled 2019-04-19 (×12): qty 1

## 2019-04-19 MED ORDER — HYDROMORPHONE HCL 1 MG/ML IJ SOLN
0.5000 mg | INTRAMUSCULAR | Status: DC | PRN
  Administered 2019-04-19: 20:00:00 0.5 mg via INTRAVENOUS
  Filled 2019-04-19: qty 1

## 2019-04-19 MED ORDER — METHOCARBAMOL 500 MG IVPB - SIMPLE MED
500.0000 mg | Freq: Four times a day (QID) | INTRAVENOUS | Status: DC | PRN
  Administered 2019-04-19: 12:00:00 500 mg via INTRAVENOUS
  Filled 2019-04-19: qty 50

## 2019-04-19 MED ORDER — ZOLPIDEM TARTRATE 5 MG PO TABS: 5.0000 mg | ORAL_TABLET | Freq: Every evening | ORAL | Status: DC | PRN

## 2019-04-19 MED ORDER — FLEET ENEMA 7-19 GM/118ML RE ENEM: 1.0000 | ENEMA | Freq: Once | RECTAL | Status: DC | PRN

## 2019-04-19 MED ORDER — METHOCARBAMOL 500 MG PO TABS
500.0000 mg | ORAL_TABLET | Freq: Four times a day (QID) | ORAL | Status: DC | PRN
  Administered 2019-04-19 – 2019-04-23 (×8): 500 mg via ORAL
  Filled 2019-04-19 (×8): qty 1

## 2019-04-19 MED ORDER — CHLORHEXIDINE GLUCONATE 4 % EX LIQD: 60.0000 mL | Freq: Once | CUTANEOUS | Status: DC

## 2019-04-19 MED ORDER — LACTATED RINGERS IV SOLN
INTRAVENOUS | Status: DC
  Administered 2019-04-19 (×2): via INTRAVENOUS

## 2019-04-19 MED ORDER — ALUM & MAG HYDROXIDE-SIMETH 200-200-20 MG/5ML PO SUSP: 30.0000 mL | ORAL | Status: DC | PRN

## 2019-04-19 MED ORDER — INSULIN ASPART 100 UNIT/ML ~~LOC~~ SOLN
0.0000 [IU] | Freq: Three times a day (TID) | SUBCUTANEOUS | Status: DC
  Administered 2019-04-19: 18:00:00 3 [IU] via SUBCUTANEOUS
  Administered 2019-04-21 (×2): 2 [IU] via SUBCUTANEOUS

## 2019-04-19 MED ORDER — BUPIVACAINE HCL (PF) 0.25 % IJ SOLN
INTRAMUSCULAR | Status: AC
  Filled 2019-04-19: qty 30

## 2019-04-19 MED ORDER — EPHEDRINE SULFATE-NACL 50-0.9 MG/10ML-% IV SOSY
PREFILLED_SYRINGE | INTRAVENOUS | Status: DC | PRN
  Administered 2019-04-19: 5 mg via INTRAVENOUS

## 2019-04-19 MED ORDER — TRANEXAMIC ACID-NACL 1000-0.7 MG/100ML-% IV SOLN
1000.0000 mg | INTRAVENOUS | Status: AC
  Administered 2019-04-19: 1000 mg via INTRAVENOUS
  Filled 2019-04-19: qty 100

## 2019-04-19 MED ORDER — FENTANYL CITRATE (PF) 100 MCG/2ML IJ SOLN
50.0000 ug | INTRAMUSCULAR | Status: DC
  Administered 2019-04-19: 09:00:00 100 ug via INTRAVENOUS
  Filled 2019-04-19: qty 2

## 2019-04-19 MED ORDER — FAMOTIDINE 20 MG PO TABS
20.0000 mg | ORAL_TABLET | Freq: Two times a day (BID) | ORAL | Status: DC
  Administered 2019-04-19 – 2019-04-23 (×9): 20 mg via ORAL
  Filled 2019-04-19 (×9): qty 1

## 2019-04-19 MED ORDER — ASPIRIN 81 MG PO CHEW
81.0000 mg | CHEWABLE_TABLET | Freq: Two times a day (BID) | ORAL | Status: DC
  Administered 2019-04-19 – 2019-04-23 (×8): 81 mg via ORAL
  Filled 2019-04-19 (×8): qty 1

## 2019-04-19 MED ORDER — TRANEXAMIC ACID-NACL 1000-0.7 MG/100ML-% IV SOLN
1000.0000 mg | Freq: Once | INTRAVENOUS | Status: AC
  Administered 2019-04-19: 1000 mg via INTRAVENOUS
  Filled 2019-04-19: qty 100

## 2019-04-19 MED ORDER — LOSARTAN POTASSIUM 25 MG PO TABS
25.0000 mg | ORAL_TABLET | ORAL | Status: DC
  Administered 2019-04-22: 09:00:00 25 mg via ORAL
  Filled 2019-04-19: qty 1

## 2019-04-19 MED ORDER — PROPOFOL 10 MG/ML IV BOLUS
INTRAVENOUS | Status: DC | PRN
  Administered 2019-04-19: 150 mg via INTRAVENOUS
  Administered 2019-04-19: 20 mg via INTRAVENOUS
  Administered 2019-04-19: 15 mg via INTRAVENOUS
  Administered 2019-04-19: 10 mg via INTRAVENOUS
  Administered 2019-04-19: 15 mg via INTRAVENOUS

## 2019-04-19 MED ORDER — MIDAZOLAM HCL 2 MG/2ML IJ SOLN
1.0000 mg | INTRAMUSCULAR | Status: DC
  Administered 2019-04-19: 09:00:00 2 mg via INTRAVENOUS
  Filled 2019-04-19: qty 2

## 2019-04-19 MED ORDER — HYDROCHLOROTHIAZIDE 12.5 MG PO CAPS: 12.5000 mg | ORAL_CAPSULE | Freq: Every day | ORAL | Status: DC | PRN

## 2019-04-19 MED ORDER — WATER FOR IRRIGATION, STERILE IR SOLN
Status: DC | PRN
  Administered 2019-04-19: 2000 mL

## 2019-04-19 MED ORDER — FUROSEMIDE 20 MG PO TABS: 20.0000 mg | ORAL_TABLET | Freq: Every day | ORAL | Status: DC | PRN

## 2019-04-19 MED ORDER — BUPIVACAINE HCL (PF) 0.25 % IJ SOLN
INTRAMUSCULAR | Status: DC | PRN
  Administered 2019-04-19: 30 mL

## 2019-04-19 MED ORDER — DOCUSATE SODIUM 100 MG PO CAPS
100.0000 mg | ORAL_CAPSULE | Freq: Two times a day (BID) | ORAL | Status: DC
  Administered 2019-04-19 – 2019-04-23 (×8): 100 mg via ORAL
  Filled 2019-04-19 (×8): qty 1

## 2019-04-19 SURGICAL SUPPLY — 48 items
ATTUNE PS FEM RT SZ 6 CEM KNEE (Femur) ×2 IMPLANT
ATTUNE PSRP INSR SZ6 5 KNEE (Insert) ×2 IMPLANT
BAG DECANTER FOR FLEXI CONT (MISCELLANEOUS) ×2 IMPLANT
BAG ZIPLOCK 12X15 (MISCELLANEOUS) ×2 IMPLANT
BASE TIBIAL ROT PLAT SZ 7 KNEE (Knees) ×1 IMPLANT
BLADE SAG 18X100X1.27 (BLADE) ×2 IMPLANT
BLADE SAW SGTL 11.0X1.19X90.0M (BLADE) ×2 IMPLANT
BLADE SURG SZ10 CARB STEEL (BLADE) ×4 IMPLANT
BNDG ELASTIC 6X10 VLCR STRL LF (GAUZE/BANDAGES/DRESSINGS) ×2 IMPLANT
BOWL SMART MIX CTS (DISPOSABLE) ×2 IMPLANT
CEMENT HV SMART SET (Cement) ×2 IMPLANT
COVER SURGICAL LIGHT HANDLE (MISCELLANEOUS) ×2 IMPLANT
COVER WAND RF STERILE (DRAPES) ×2 IMPLANT
CUFF TOURN SGL QUICK 34 (TOURNIQUET CUFF) ×1
CUFF TRNQT CYL 34X4.125X (TOURNIQUET CUFF) ×1 IMPLANT
DECANTER SPIKE VIAL GLASS SM (MISCELLANEOUS) ×6 IMPLANT
DRAPE U-SHAPE 47X51 STRL (DRAPES) ×2 IMPLANT
DRSG AQUACEL AG ADV 3.5X10 (GAUZE/BANDAGES/DRESSINGS) ×2 IMPLANT
DURAPREP 26ML APPLICATOR (WOUND CARE) ×2 IMPLANT
ELECT REM PT RETURN 15FT ADLT (MISCELLANEOUS) ×2 IMPLANT
GLOVE BIO SURGEON STRL SZ7.5 (GLOVE) ×2 IMPLANT
GLOVE BIO SURGEON STRL SZ8.5 (GLOVE) ×2 IMPLANT
GLOVE BIOGEL PI IND STRL 8 (GLOVE) ×1 IMPLANT
GLOVE BIOGEL PI IND STRL 9 (GLOVE) ×1 IMPLANT
GLOVE BIOGEL PI INDICATOR 8 (GLOVE) ×1
GLOVE BIOGEL PI INDICATOR 9 (GLOVE) ×1
GOWN STRL REUS W/TWL XL LVL3 (GOWN DISPOSABLE) ×4 IMPLANT
HANDPIECE INTERPULSE COAX TIP (DISPOSABLE) ×1
HOOD PEEL AWAY FLYTE STAYCOOL (MISCELLANEOUS) ×6 IMPLANT
KIT TURNOVER KIT A (KITS) IMPLANT
NEEDLE HYPO 21X1.5 SAFETY (NEEDLE) ×4 IMPLANT
NS IRRIG 1000ML POUR BTL (IV SOLUTION) ×2 IMPLANT
PACK ICE MAXI GEL EZY WRAP (MISCELLANEOUS) ×2 IMPLANT
PACK TOTAL KNEE CUSTOM (KITS) ×2 IMPLANT
PATELLA MEDIAL ATTUN 35MM KNEE (Knees) ×2 IMPLANT
PIN DRILL FIX HALF THREAD (BIT) ×2 IMPLANT
PIN STEINMAN FIXATION KNEE (PIN) ×2 IMPLANT
PROTECTOR NERVE ULNAR (MISCELLANEOUS) ×2 IMPLANT
SET HNDPC FAN SPRY TIP SCT (DISPOSABLE) ×1 IMPLANT
SUT VIC AB 1 CTX 36 (SUTURE) ×1
SUT VIC AB 1 CTX36XBRD ANBCTR (SUTURE) ×1 IMPLANT
SUT VIC AB 3-0 CT1 27 (SUTURE) ×3
SUT VIC AB 3-0 CT1 TAPERPNT 27 (SUTURE) ×3 IMPLANT
SYR CONTROL 10ML LL (SYRINGE) ×4 IMPLANT
TIBIAL BASE ROT PLAT SZ 7 KNEE (Knees) ×2 IMPLANT
TRAY FOLEY MTR SLVR 16FR STAT (SET/KITS/TRAYS/PACK) ×2 IMPLANT
WATER STERILE IRR 1000ML POUR (IV SOLUTION) ×4 IMPLANT
YANKAUER SUCT BULB TIP 10FT TU (MISCELLANEOUS) ×2 IMPLANT

## 2019-04-19 NOTE — Interval H&P Note (Signed)
History and Physical Interval Note:  04/19/2019 7:12 AM  [name]  has presented today for surgery, with the diagnosis of RIGHT KNEE OSTEOARTHRITIS.  The various methods of treatment have been discussed with the patient and family. After consideration of risks, benefits and other options for treatment, the patient has consented to  Procedure(s): RIGHT TOTAL KNEE ARTHROPLASTY (Right) as a surgical intervention.  The patient's history has been reviewed, patient examined, no change in status, stable for surgery.  I have reviewed the patient's chart and labs.  Questions were answered to the patient's satisfaction.     Kerin Salen

## 2019-04-19 NOTE — Op Note (Signed)
PATIENT ID:      [name]  MRN:     [id number] DOB/AGE:    1945-10-23 / 73 y.o.       OPERATIVE REPORT   DATE OF PROCEDURE:  04/19/2019      PREOPERATIVE DIAGNOSIS:   RIGHT KNEE OSTEOARTHRITIS      Estimated body mass index is 38.09 kg/m as calculated from the following:   Height as of this encounter: 5\' 3"  (1.6 m).   Weight as of this encounter: 97.5 kg.                                                       POSTOPERATIVE DIAGNOSIS:   Same                                                                     PROCEDURE:  Procedure(s): RIGHT TOTAL KNEE ARTHROPLASTY Using DepuyAttune RP implants #6R Femur, #7Tibia, 5 mm Attune RP bearing, 35 Patella    SURGEON: Nestor LewandowskyFrank J Bunny Kleist  ASSISTANT:   Tomi LikensEric K. [employer]ill[employer] PA-C   (Present and scrubbed throughout the case, critical for assistance with exposure, retraction, instrumentation, and closure.)        ANESTHESIA: GET, 20cc Exparel, 50cc 0.25% Marcaine EBL: 350 cc FLUID REPLACEMENT: 1600 cc crystaloid TOURNIQUET: DRAINS: None TRANEXAMIC ACID: 1gm IV, 2gm topical COMPLICATIONS:  None         INDICATIONS FOR PROCEDURE: The patient has  RIGHT KNEE OSTEOARTHRITIS, Var deformities, XR shows bone on bone arthritis, lateral subluxation of tibia. Patient has failed all conservative measures including anti-inflammatory medicines, narcotics, attempts at exercise and weight loss, cortisone injections and viscosupplementation.  Risks and benefits of surgery have been discussed, questions answered.   DESCRIPTION OF PROCEDURE: The patient identified by armband, received  IV antibiotics, in the holding area at [facility]. Patient taken to the operating room, appropriate anesthetic monitors were attached, and GET anesthesia was  induced. IV Tranexamic acid was given.Tourniquet applied high to the operative thigh. Lateral post and foot positioner applied to the table, the lower extremity was then prepped and draped in usual sterile fashion from the  toes to the tourniquet. Time-out procedure was performed. The skin and subcutaneous tissue along the incision was injected with 20 cc of a mixture of Exparel and Marcaine solution, using a 20-gauge by 1-1/2 inch needle. We began the operation, with the knee flexed 130 degrees, by making the anterior midline incision starting at handbreadth above the patella going over the patella 1 cm medial to and 4 cm distal to the tibial tubercle. Small bleeders in the skin and the subcutaneous tissue identified and cauterized. Transverse retinaculum was incised and reflected medially and a medial parapatellar arthrotomy was accomplished. the patella was everted and theprepatellar fat pad resected. The superficial medial collateral ligament was then elevated from anterior to posterior along the proximal flare of the tibia and anterior half of the menisci resected. The knee was hyperflexed exposing bone on bone arthritis. Peripheral and notch osteophytes as well as the cruciate ligaments were then resected. We continued to work our  way around posteriorly along the proximal tibia, and externally rotated the tibia subluxing it out from underneath the femur. A McHale PCL retractor was placed through the notch and a lateral Hohmann retractor placed, and we then entered the proximal tibia in line with the Depuy starter drill in line with the axis of the tibia followed by an intramedullary guide rod and 0-degree posterior slope cutting guide. The tibial cutting guide, 4 degree posterior sloped, was pinned into place allowing resection of 0 mm of bone medially and 11 mm of bone laterally. Satisfied with the tibial resection, we then entered the distal femur 2 mm anterior to the PCL origin with the intramedullary guide rod and applied the distal femoral cutting guide set at 9 mm, with 5 degrees of valgus. This was pinned along the epicondylar axis. At this point, the distal femoral cut was accomplished without difficulty. We then sized  for a #6R femoral component and pinned the guide in 3 degrees of external rotation. The chamfer cutting guide was pinned into place. The anterior, posterior, and chamfer cuts were accomplished without difficulty followed by the Attune RP box cutting guide and the box cut. We also removed posterior osteophytes from the posterior femoral condyles. The posterior capsule was injected with Exparel solution. The knee was brought into full extension. We checked our extension gap and fit a 5 mm bearing. Distracting in extension with a lamina spreader,  bleeders in the posterior capsule, Posterior medial and posterior lateral gutter were cauterized.  The transexamic acid-soaked sponge was then placed in the gap of the knee in extension. The knee was flexed 30. The posterior patella cut was accomplished with the 9.5 mm Attune cutting guide, sized for a 44mm dome, and the fixation pegs drilled.The knee was then once again hyperflexed exposing the proximal tibia. We sized for a # 7 tibial base plate, applied the smokestack and the conical reamer followed by the the Delta fin keel punch. We then hammered into place the Attune RP trial femoral component, drilled the lugs, inserted a  5 mm trial bearing, trial patellar button, and took the knee through range of motion from 0-130 degrees. Medial and lateral ligamentous stability was checked. No thumb pressure was required for patellar Tracking. The tourniquet was not used. All trial components were removed, mating surfaces irrigated with pulse lavage, and dried with suction and sponges. 10 cc of the Exparel solution was applied to the cancellus bone of the patella distal femur and proximal tibia.  After waiting 30 seconds, the bony surfaces were again, dried with sponges. A double batch of DePuy HV cement was mixed and applied to all bony metallic mating surfaces except for the posterior condyles of the femur itself. In order, we hammered into place the tibial tray and removed  excess cement, the femoral component and removed excess cement. The final Attune RP bearing was inserted, and the knee brought to full extension with compression. The patellar button was clamped into place, and excess cement removed. The knee was held at 30 flexion with compression, while the cement cured. The wound was irrigated out with normal saline solution pulse lavage. The rest of the Exparel was injected into the parapatellar arthrotomy, subcutaneous tissues, and periosteal tissues. The parapatellar arthrotomy was closed with running #1 Vicryl suture. The subcutaneous tissue with 0 and 2-0 undyed Vicryl suture, and the skin with running 3-0 SQ vicryl. An Aquacil and Ace wrap were applied. The patient was taken to recovery room without difficulty.  Nestor Lewandowsky 04/19/2019, 8:48 AM

## 2019-04-19 NOTE — Progress Notes (Signed)
AssistedDr. Ossey with right, ultrasound guided, adductor canal block. Side rails up, monitors on throughout procedure. See vital signs in flow sheet. Tolerated Procedure well.  

## 2019-04-19 NOTE — Evaluation (Signed)
Physical Therapy Evaluation Patient Details Name: [name] MRN: [id number] DOB: [birth year] Today's Date: 04/19/2019   History of Present Illness  s/p R TKA  Clinical Impression  Pt is s/p TKA resulting in the deficits listed below (see PT Problem List).  Pt able to get OOB to chair, requiring +2 assist d/t pain and lethargy, agreeable to OOB. Will follow in acute setting  Pt will benefit from skilled PT to increase their independence and safety with mobility to allow discharge to the venue listed below. '   Follow Up Recommendations Follow surgeon's recommendation for DC plan and follow-up therapies    Equipment Recommendations  Rolling walker with 5" wheels;3in1 (PT)    Recommendations for Other Services       Precautions / Restrictions Precautions Precautions: Knee;Fall Restrictions Weight Bearing Restrictions: No      Mobility  Bed Mobility Overal bed mobility: Needs Assistance Bed Mobility: Supine to Sit     Supine to sit: Min assist;Mod assist;+2 for safety/equipment     General bed mobility comments: assist with LEs and trunk, pt then with sudden LOB posteriorly (d/t pain) requiring +2 assist to recover  Transfers Overall transfer level: Needs assistance Equipment used: Rolling walker (2 wheeled) Transfers: Sit to/from [employer] Sit to Stand: Min assist;Mod assist;+2 physical assistance;+2 safety/equipment;From elevated surface Stand pivot transfers: Min assist;Mod assist;+2 physical assistance;+2 safety/equipment       General transfer comment: cues for hand placement and sequencing, pt requiring +2 assist for anterior superior wt shift and to take pivotal steps to chair  Ambulation/Gait             General Gait Details: pivotal steps only d/t lethargy  Stairs            Wheelchair Mobility    Modified Rankin (Stroke Patients Only)       Balance Overall balance assessment: Needs assistance Sitting-balance  support: Bilateral upper extremity supported;Feet supported Sitting balance-Leahy Scale: Fair       Standing balance-Leahy Scale: Poor                               Pertinent Vitals/Pain Pain Assessment: 0-10 Pain Location: right knee Pain Descriptors / Indicators: Grimacing;Sore Pain Intervention(s): Limited activity within patient's tolerance;Monitored during session;Repositioned    Home Living Family/patient expects to be discharged to:: Private residence Living Arrangements: Alone Available Help at Discharge: Family Type of Home: House Home Access: Stairs to enter   Secretary/administrator of Steps: 3 Home Layout: Two level Home Equipment: None      Prior Function Level of Independence: Independent               Hand Dominance        Extremity/Trunk Assessment   Upper Extremity Assessment Upper Extremity Assessment: Overall WFL for tasks assessed    Lower Extremity Assessment Lower Extremity Assessment: RLE deficits/detail RLE Deficits / Details: ankle WFL; knee extension with ~15 degree quad lag 2+/5, hip flexion 2+/5       Communication   Communication: No difficulties  Cognition Arousal/Alertness: Lethargic;Suspect due to medications Behavior During Therapy: [facility] for tasks assessed/performed Overall Cognitive Status: Within Functional Limits for tasks assessed                                        General Comments  Exercises Total Joint Exercises Ankle Circles/Pumps: AROM;5 reps;Limitations Ankle Circles/Pumps Limitations: lethargic   Assessment/Plan    PT Assessment Patient needs continued PT services  PT Problem List Decreased strength;Decreased activity tolerance;Decreased mobility;Pain;Decreased range of motion;Decreased balance;Decreased knowledge of use of DME       PT Treatment Interventions DME instruction;Gait training;Functional mobility training;Therapeutic activities;Patient/family  education;Therapeutic exercise;Stair training    PT Goals (Current goals can be found in the Care Plan section)  Acute Rehab PT Goals PT Goal Formulation: With patient Time For Goal Achievement: 04/26/19 Potential to Achieve Goals: Good    Frequency 7X/week   Barriers to discharge        Co-evaluation               AM-PAC PT "6 Clicks" Mobility  Outcome Measure Help needed turning from your back to your side while in a flat bed without using bedrails?: A Lot Help needed moving from lying on your back to sitting on the side of a flat bed without using bedrails?: A Lot Help needed moving to and from a bed to a chair (including a wheelchair)?: A Lot Help needed standing up from a chair using your arms (e.g., wheelchair or bedside chair)?: Total Help needed to walk in hospital room?: Total Help needed climbing 3-5 steps with a railing? : Total 6 Click Score: 9    End of Session Equipment Utilized During Treatment: Gait belt Activity Tolerance: Patient tolerated treatment well Patient left: in chair;with call bell/phone within reach;with chair alarm set   PT Visit Diagnosis: Difficulty in walking, not elsewhere classified (R26.2)    Time: 1740-1757 PT Time Calculation (min) (ACUTE ONLY): 17 min   Charges:   PT Evaluation $PT Eval Low Complexity: 1 Low          Kenyon Ana, PT  Pager: [phone] Acute Rehab Dept [facility]): [phone]   04/19/2019   [facility] 04/19/2019, 6:23 PM

## 2019-04-19 NOTE — Progress Notes (Signed)
Per pt and pt's daughter, she does not wear cpap at night and does not want machine.  She does wear O2 via nasal cannula at night.[name]

## 2019-04-19 NOTE — Discharge Instructions (Signed)

## 2019-04-19 NOTE — Anesthesia Postprocedure Evaluation (Signed)
Anesthesia Post Note  Patient: [name]  Procedure(s) Performed: RIGHT TOTAL KNEE ARTHROPLASTY (Right Knee)     Patient location during evaluation: PACU Anesthesia Type: Spinal Level of consciousness: awake and alert Pain management: pain level controlled Vital Signs Assessment: post-procedure vital signs reviewed and stable Respiratory status: spontaneous breathing, nonlabored ventilation, respiratory function stable and patient connected to nasal cannula oxygen Cardiovascular status: blood pressure returned to baseline and stable Postop Assessment: no apparent nausea or vomiting Anesthetic complications: no    Last Vitals:  Vitals:   04/19/19 1330 04/19/19 1400  BP: 131/63 138/83  Pulse: (!) 49 (!) 53  Resp: 12 16  Temp:    SpO2: 100% 92%    Last Pain:  Vitals:   04/19/19 1330  TempSrc:   PainSc: Asleep                 Kalandra Masters COKER

## 2019-04-19 NOTE — Care Plan (Signed)
Ortho Bundle Case Management Note  Patient Details  Name: [name] MRN: [id number] Date of Birth: [birth year]  Spoke with patient prior to surgery. She plans to discharge to home with family and HHPT. Rolling walker and 3n1 ordered for home use. HH referral to [facility]. She will transition to OPPT after follow up with MD. Patient and MD in agreement with plan.  Choice offered.                    DME Arranged:  Bedside commode, Walker rolling DME Agency:  [employer]  HH Arranged:  PT HH Agency:  [place] ([place])  Additional Comments: Please contact me with any questions of if this plan should need to change.  Ladell Heads,  [place] Specialist  [phone] 04/19/2019, 1:38 PM

## 2019-04-19 NOTE — Transfer of Care (Signed)
Immediate Anesthesia Transfer of Care Note  Patient: [name]  Procedure(s) Performed: RIGHT TOTAL KNEE ARTHROPLASTY (Right Knee)  Patient Location: PACU  Anesthesia Type:General  Level of Consciousness: drowsy and patient cooperative  Airway & Oxygen Therapy: Patient Spontanous Breathing and Patient connected to face mask oxygen  Post-op Assessment: Report given to RN and Post -op Vital signs reviewed and stable  Post vital signs: Reviewed and stable  Last Vitals:  Vitals Value Taken Time  BP 140/76 04/19/19 1145  Temp    Pulse 59 04/19/19 1145  Resp 10 04/19/19 1145  SpO2 100 % 04/19/19 1145  Vitals shown include unvalidated device data.  Last Pain:  Vitals:   04/19/19 0756  TempSrc:   PainSc: 6       Patients Stated Pain Goal: 5 (09/81/19 1478)  Complications: No apparent anesthesia complications

## 2019-04-19 NOTE — Anesthesia Procedure Notes (Signed)
Procedure Name: LMA Insertion Date/Time: 04/19/2019 9:58 AM Performed by: Lavina Hamman, CRNA Pre-anesthesia Checklist: Patient identified, Emergency Drugs available, Suction available and Patient being monitored Patient Re-evaluated:Patient Re-evaluated prior to induction Oxygen Delivery Method: Circle System Utilized Preoxygenation: Pre-oxygenation with 100% oxygen Induction Type: IV induction Ventilation: Mask ventilation without difficulty LMA: LMA with gastric port inserted LMA Size: 4.0 Number of attempts: 1 Airway Equipment and Method: Bite block Placement Confirmation: positive ETCO2 Tube secured with: Tape Dental Injury: Teeth and Oropharynx as per pre-operative assessment  Comments: Spinal attempts by Ossey, converted to LMA.

## 2019-04-19 NOTE — Anesthesia Preprocedure Evaluation (Signed)
Anesthesia Evaluation  Patient identified by MRN, date of birth, ID band Patient awake    Reviewed: Allergy & Precautions, NPO status , Patient's Chart, lab work & pertinent test results  Airway Mallampati: II  TM Distance: >3 FB Neck ROM: Full    Dental   Pulmonary sleep apnea ,    Pulmonary exam normal        Cardiovascular hypertension, Pt. on medications Normal cardiovascular exam     Neuro/Psych    GI/Hepatic   Endo/Other  diabetes, Type 2, Oral Hypoglycemic Agents  Renal/GU      Musculoskeletal   Abdominal   Peds  Hematology   Anesthesia Other Findings   Reproductive/Obstetrics                             Anesthesia Physical Anesthesia Plan  ASA: III  Anesthesia Plan: Spinal   Post-op Pain Management:  Regional for Post-op pain   Induction: Intravenous  PONV Risk Score and Plan: 2  Airway Management Planned: Simple Face Mask  Additional Equipment:   Intra-op Plan:   Post-operative Plan:   Informed Consent: I have reviewed the patients History and Physical, chart, labs and discussed the procedure including the risks, benefits and alternatives for the proposed anesthesia with the patient or authorized representative who has indicated his/her understanding and acceptance.       Plan Discussed with: CRNA and Surgeon  Anesthesia Plan Comments:         Anesthesia Quick Evaluation

## 2019-04-19 NOTE — Plan of Care (Signed)
Plan of care for post op day 0 discussed with daughter.    Will continue to monitor and assess patient.    [place], Therapist, sports

## 2019-04-19 NOTE — Care Plan (Signed)
Ortho Bundle Case Management Note  Patient Details  Name: [name] MRN: [id number] Date of Birth: December 13, 1945  Spoke with patient prior to surgery. She will discharge to home with her daughter to assist her. HHPT referral to [facility]. Rolling walker and 3n1 ordered for home use. She will transition to OPPT after follow up with MD in the office. Patient and MD in agreement with plan.  Choice offered.                    DME Arranged:  Bedside commode, Walker rolling DME Agency:  [employer]  HH Arranged:  PT HH Agency:  [place] ([place])  Additional Comments: Please contact me with any questions of if this plan should need to change.  Ladell Heads,  [place] [facility]opa[facility] Specialist  [phone] 04/19/2019, 9:10 AM

## 2019-04-19 NOTE — Anesthesia Procedure Notes (Signed)
Anesthesia Regional Block: Adductor canal block   Pre-Anesthetic Checklist: ,, timeout performed, Correct Patient, Correct Site, Correct Laterality, Correct Procedure, Correct Position, site marked, Risks and benefits discussed,  Surgical consent,  Pre-op evaluation,  At surgeon's request and post-op pain management  Laterality: Right  Prep: chloraprep       Needles:  Injection technique: Single-shot  Needle Type: Echogenic Stimulator Needle     Needle Length: 9cm  Needle Gauge: 21     Additional Needles:   Narrative:  Start time: 04/19/2019 8:57 AM End time: 04/19/2019 9:07 AM Injection made incrementally with aspirations every 5 mL.  Performed by: Personally  Anesthesiologist: Lillia Abed, MD  Additional Notes: Monitors applied. Patient sedated. Sterile prep and drape,hand hygiene and sterile gloves were used. Relevant anatomy identified.Needle position confirmed.Local anesthetic injected incrementally after negative aspiration. Local anesthetic spread visualized around nerve(s). Vascular puncture avoided. No complications. Image printed for medical record.The patient tolerated the procedure well.    Lillia Abed MD

## 2019-04-20 ENCOUNTER — Encounter (HOSPITAL_COMMUNITY): Payer: Self-pay | Admitting: Orthopedic Surgery

## 2019-04-20 DIAGNOSIS — Z882 Allergy status to sulfonamides status: Secondary | ICD-10-CM | POA: Diagnosis not present

## 2019-04-20 DIAGNOSIS — E119 Type 2 diabetes mellitus without complications: Secondary | ICD-10-CM | POA: Diagnosis present

## 2019-04-20 DIAGNOSIS — R5382 Chronic fatigue, unspecified: Secondary | ICD-10-CM | POA: Diagnosis present

## 2019-04-20 DIAGNOSIS — I110 Hypertensive heart disease with heart failure: Secondary | ICD-10-CM | POA: Diagnosis present

## 2019-04-20 DIAGNOSIS — Z803 Family history of malignant neoplasm of breast: Secondary | ICD-10-CM | POA: Diagnosis not present

## 2019-04-20 DIAGNOSIS — Z7984 Long term (current) use of oral hypoglycemic drugs: Secondary | ICD-10-CM | POA: Diagnosis not present

## 2019-04-20 DIAGNOSIS — Z833 Family history of diabetes mellitus: Secondary | ICD-10-CM | POA: Diagnosis not present

## 2019-04-20 DIAGNOSIS — Z79899 Other long term (current) drug therapy: Secondary | ICD-10-CM | POA: Diagnosis not present

## 2019-04-20 DIAGNOSIS — D62 Acute posthemorrhagic anemia: Secondary | ICD-10-CM | POA: Diagnosis not present

## 2019-04-20 DIAGNOSIS — Z7982 Long term (current) use of aspirin: Secondary | ICD-10-CM | POA: Diagnosis not present

## 2019-04-20 DIAGNOSIS — Z887 Allergy status to serum and vaccine status: Secondary | ICD-10-CM | POA: Diagnosis not present

## 2019-04-20 DIAGNOSIS — G4733 Obstructive sleep apnea (adult) (pediatric): Secondary | ICD-10-CM | POA: Diagnosis present

## 2019-04-20 DIAGNOSIS — I509 Heart failure, unspecified: Secondary | ICD-10-CM | POA: Diagnosis present

## 2019-04-20 DIAGNOSIS — Z6838 Body mass index (BMI) 38.0-38.9, adult: Secondary | ICD-10-CM | POA: Diagnosis not present

## 2019-04-20 DIAGNOSIS — M1711 Unilateral primary osteoarthritis, right knee: Secondary | ICD-10-CM | POA: Diagnosis present

## 2019-04-20 DIAGNOSIS — Z8249 Family history of ischemic heart disease and other diseases of the circulatory system: Secondary | ICD-10-CM | POA: Diagnosis not present

## 2019-04-20 DIAGNOSIS — Z823 Family history of stroke: Secondary | ICD-10-CM | POA: Diagnosis not present

## 2019-04-20 DIAGNOSIS — E785 Hyperlipidemia, unspecified: Secondary | ICD-10-CM | POA: Diagnosis present

## 2019-04-20 LAB — CBC
HCT: 35.7 % — ABNORMAL LOW (ref 36.0–46.0)
Hemoglobin: 10.8 g/dL — ABNORMAL LOW (ref 12.0–15.0)
MCH: 28.6 pg (ref 26.0–34.0)
MCHC: 30.3 g/dL (ref 30.0–36.0)
MCV: 94.4 fL (ref 80.0–100.0)
Platelets: 220 10*3/uL (ref 150–400)
RBC: 3.78 MIL/uL — ABNORMAL LOW (ref 3.87–5.11)
RDW: 14.3 % (ref 11.5–15.5)
WBC: 9.5 10*3/uL (ref 4.0–10.5)
nRBC: 0 % (ref 0.0–0.2)

## 2019-04-20 LAB — GLUCOSE, CAPILLARY
Glucose-Capillary: 98 mg/dL (ref 70–99)
Glucose-Capillary: 115 mg/dL — ABNORMAL HIGH (ref 70–99)
Glucose-Capillary: 116 mg/dL — ABNORMAL HIGH (ref 70–99)
Glucose-Capillary: 120 mg/dL — ABNORMAL HIGH (ref 70–99)

## 2019-04-20 LAB — BASIC METABOLIC PANEL
Anion gap: 7 (ref 5–15)
BUN: 10 mg/dL (ref 8–23)
CO2: 25 mmol/L (ref 22–32)
Calcium: 8.8 mg/dL — ABNORMAL LOW (ref 8.9–10.3)
Chloride: 107 mmol/L (ref 98–111)
Creatinine, Ser: 0.71 mg/dL (ref 0.44–1.00)
GFR calc Af Amer: >60 mL/min (ref >60)
GFR calc non Af Amer: >60 mL/min (ref >60)
Glucose, Bld: 120 mg/dL — ABNORMAL HIGH (ref 70–99)
Potassium: 4.2 mmol/L (ref 3.5–5.1)
Sodium: 139 mmol/L (ref 135–145)

## 2019-04-20 NOTE — Progress Notes (Signed)
Physical Therapy Treatment Patient Details Name: [name] MRN: [id number] DOB: 07-02-1945 Today's Date: 04/20/2019    History of Present Illness s/p R TKA    PT Comments    Pt assisted with short distance ambulation again this afternoon after fatigues quickly an requires mod assist for mobility. Pt assisted back to bed and performed LE exercises.    Follow Up Recommendations  Follow surgeon's recommendation for DC plan and follow-up therapies;Supervision/Assistance - 24 hour     Equipment Recommendations  Rolling walker with 5" wheels;3in1 (PT)    Recommendations for Other Services       Precautions / Restrictions Precautions Precautions: Knee;Fall Restrictions Weight Bearing Restrictions: No    Mobility  Bed Mobility Overal bed mobility: Needs Assistance Bed Mobility: Sit to Supine     Supine to sit: Min assist Sit to supine: Mod assist   General bed mobility comments: assist for LEs onto bed  Transfers Overall transfer level: Needs assistance Equipment used: Rolling walker (2 wheeled) Transfers: Sit to/from Stand Sit to Stand: Mod assist;+2 safety/equipment;+2 physical assistance Stand pivot transfers: Min assist;+2 safety/equipment;+2 physical assistance       General transfer comment: verbal cues for UE and LE positioning, pt required assist to rise, steady and control descent  Ambulation/Gait Ambulation/Gait assistance: Mod assist;+2 physical assistance;+2 safety/equipment;Min assist Gait Distance (Feet): 15 Feet Assistive device: Rolling walker (2 wheeled) Gait Pattern/deviations: Step-to pattern;Decreased stance time - right;Antalgic Gait velocity: decreased   General Gait Details: verbal cues for sequence, RW positioning, step length; pt fatigued quickly again and required recliner, pt requiring mod assist for safe descent as pt tends not to give much warning prior to sitting   Stairs             Wheelchair Mobility    Modified  Rankin (Stroke Patients Only)       Balance                                            Cognition Arousal/Alertness: Awake/alert Behavior During Therapy: WFL for tasks assessed/performed Overall Cognitive Status: Within Functional Limits for tasks assessed                                        Exercises Total Joint Exercises Ankle Circles/Pumps: AROM;10 reps;Both Quad Sets: AROM;Right;10 reps Heel Slides: AAROM;Right;10 reps Hip ABduction/ADduction: AAROM;Right;10 reps Straight Leg Raises: AAROM;Right;10 reps    General Comments        Pertinent Vitals/Pain Pain Assessment: 0-10 Pain Score: 6  Pain Location: right knee Pain Descriptors / Indicators: Grimacing;Sore;Aching Pain Intervention(s): Monitored during session;Patient requesting pain meds-RN notified;Repositioned;Ice applied    Home Living                      Prior Function            PT Goals (current goals can now be found in the care plan section) Progress towards PT goals: Progressing toward goals    Frequency    7X/week      PT Plan Current plan remains appropriate    Co-evaluation              AM-PAC PT "6 Clicks" Mobility   Outcome Measure  Help needed turning from your back to your side  while in a flat bed without using bedrails?: A Lot Help needed moving from lying on your back to sitting on the side of a flat bed without using bedrails?: A Lot Help needed moving to and from a bed to a chair (including a wheelchair)?: A Lot Help needed standing up from a chair using your arms (e.g., wheelchair or bedside chair)?: A Lot Help needed to walk in hospital room?: A Lot Help needed climbing 3-5 steps with a railing? : Total 6 Click Score: 11    End of Session Equipment Utilized During Treatment: Gait belt Activity Tolerance: Patient limited by fatigue Patient left: with call bell/phone within reach;in bed;with bed alarm set   PT Visit  Diagnosis: Difficulty in walking, not elsewhere classified (R26.2)     Time: 9735-3299 PT Time Calculation (min) (ACUTE ONLY): 19 min  Charges: $Therapeutic Exercise: 8-22 mins                     Carmelia Bake, PT, DPT Acute Rehabilitation Services Office: [phone] Pager: [phone]  Trena Platt 04/20/2019, 3:06 PM

## 2019-04-20 NOTE — Progress Notes (Signed)
PATIENT ID: LEGACI TARMAN  MRN: [id number]  DOB/AGE:  Mar 22, 1946 / 73 y.o.  1 Day Post-Op Procedure(s) (LRB): RIGHT TOTAL KNEE ARTHROPLASTY (Right)    PROGRESS NOTE Subjective: Patient is alert, oriented, no Nausea, no Vomiting, yes passing gas. Taking PO well. Denies SOB, Chest or Calf Pain. Using Incentive Spirometer, PAS in place. Ambulate in room, Patient reports pain as 3/10 .    Objective: Vital signs in last 24 hours: Vitals:   04/19/19 1704 04/19/19 2213 04/20/19 0148 04/20/19 0510  BP: 131/78 (Abnormal) 145/71 122/61 (Abnormal) 120/59  Pulse: (Abnormal) 51 (Abnormal) 58 (Abnormal) 58 64  Resp: 18 18 16 16   Temp: 98.2 F (36.8 C) 98.2 F (36.8 C) 98 F (36.7 C) 98.1 F (36.7 C)  TempSrc:  Oral    SpO2: 98% 98% 100% 100%  Weight:      Height:          Intake/Output from previous day: I/O last 3 completed shifts: In: 3090.9 [P.O.:420; I.V.:2470.9; IV Piggyback:200] Out: 4000 [Urine:3950; Blood:50]   Intake/Output this shift: No intake/output data recorded.   LABORATORY DATA: Recent Labs    04/19/19 1649 04/19/19 2215 04/20/19 0238 04/20/19 0730  WBC  --   --  9.5  --   HGB  --   --  10.8*  --   HCT  --   --  35.7*  --   PLT  --   --  220  --   NA  --   --  139  --   K  --   --  4.2  --   CL  --   --  107  --   CO2  --   --  25  --   BUN  --   --  10  --   CREATININE  --   --  0.71  --   GLUCOSE  --   --  120*  --   GLUCAP 145* 157*  --  98  CALCIUM  --   --  8.8*  --     Examination: Neurologically intact ABD soft Neurovascular intact Sensation intact distally Intact pulses distally Dorsiflexion/Plantar flexion intact Incision: dressing C/D/I No cellulitis present Compartment soft}  Assessment:   1 Day Post-Op Procedure(s) (LRB): RIGHT TOTAL KNEE ARTHROPLASTY (Right) ADDITIONAL DIAGNOSIS: Expected Acute Blood Loss Anemia, Diabetes, Hypertension and Sleep Apnea, morbid obesity  Patient's anticipated LOS is less than 2 midnights, meeting  these requirements: - Younger than 35 - Lives within 1 hour of care - Has a competent adult at home to recover with post-op recover - NO history of  - Chronic pain requiring opiods  - Diabetes  - Coronary Artery Disease  - Heart failure  - Heart attack  - Stroke  - DVT/VTE  - Cardiac arrhythmia  - Respiratory Failure/COPD  - Renal failure  - Anemia  - Advanced Liver disease       Plan: PT/OT WBAT, AROM and PROM  DVT Prophylaxis:  SCDx72hrs, ASA 81 mg BID x 2 weeks DISCHARGE PLAN: Home DISCHARGE NEEDS: HHPT, Walker and 3-in-1 comode seat     Kerin Salen 04/20/2019, 8:00 AM Patient ID: Selinda Michaels, female   DOB: 1945/11/18, 73 y.o.   MRN: [id number]

## 2019-04-20 NOTE — Progress Notes (Signed)
Physical Therapy Treatment Patient Details Name: [name] MRN: [id number] DOB: 1945-11-28 Today's Date: 04/20/2019    History of Present Illness s/p R TKA    PT Comments    Pt assisted with ambulating however tolerated short distance, reports fatigue and weakness limiting.  Pt then requested to use bathroom so BSC brought to recliner and pt assisted with transfer.  Pt requires at least mod assist at this time for mobility.    Follow Up Recommendations  Follow surgeon's recommendation for DC plan and follow-up therapies;Supervision/Assistance - 24 hour     Equipment Recommendations  Rolling walker with 5" wheels;3in1 (PT)    Recommendations for Other Services       Precautions / Restrictions Precautions Precautions: Knee;Fall Restrictions Weight Bearing Restrictions: No    Mobility  Bed Mobility Overal bed mobility: Needs Assistance Bed Mobility: Supine to Sit     Supine to sit: Min assist     General bed mobility comments: assist for R LE  Transfers Overall transfer level: Needs assistance Equipment used: Rolling walker (2 wheeled) Transfers: Sit to/from Stand Sit to Stand: Mod assist;+2 safety/equipment;+2 physical assistance;From elevated surface Stand pivot transfers: Min assist;+2 safety/equipment;+2 physical assistance       General transfer comment: verbal cues for UE and LE positioning, pt required assist to rise, steady and control descent  Ambulation/Gait Ambulation/Gait assistance: Mod assist;+2 physical assistance;+2 safety/equipment Gait Distance (Feet): 8 Feet Assistive device: Rolling walker (2 wheeled) Gait Pattern/deviations: Step-to pattern;Decreased stance time - right;Antalgic     General Gait Details: verbal cues for sequence, RW positioning, step length; pt fatigued quickly and required recliner, pt requiring mod assist for safe descent; pt reports continous lightheadedness however BP upon sitting in recliner was 120/60 mmHg and  HR 60 bpm   Stairs             Wheelchair Mobility    Modified Rankin (Stroke Patients Only)       Balance                                            Cognition Arousal/Alertness: Awake/alert Behavior During Therapy: WFL for tasks assessed/performed Overall Cognitive Status: Within Functional Limits for tasks assessed                                        Exercises      General Comments        Pertinent Vitals/Pain Pain Assessment: 0-10 Pain Score: 5  Pain Location: right knee Pain Descriptors / Indicators: Grimacing;Sore;Aching Pain Intervention(s): Monitored during session;Repositioned    Home Living                      Prior Function            PT Goals (current goals can now be found in the care plan section) Progress towards PT goals: Progressing toward goals    Frequency    7X/week      PT Plan Current plan remains appropriate    Co-evaluation              AM-PAC PT "6 Clicks" Mobility   Outcome Measure  Help needed turning from your back to your side while in a flat bed without using bedrails?: A Lot Help  needed moving from lying on your back to sitting on the side of a flat bed without using bedrails?: A Lot Help needed moving to and from a bed to a chair (including a wheelchair)?: A Lot Help needed standing up from a chair using your arms (e.g., wheelchair or bedside chair)?: A Lot Help needed to walk in hospital room?: A Lot Help needed climbing 3-5 steps with a railing? : Total 6 Click Score: 11    End of Session Equipment Utilized During Treatment: Gait belt Activity Tolerance: Patient tolerated treatment well Patient left: in chair;with call bell/phone within reach;with chair alarm set;with family/visitor present   PT Visit Diagnosis: Difficulty in walking, not elsewhere classified (R26.2)     Time: 1017-1040 PT Time Calculation (min) (ACUTE ONLY): 23 min  Charges:   $Gait Training: 8-22 mins $Therapeutic Activity: 8-22 mins                     Zenovia Jarred, PT, DPT Acute Rehabilitation Services Office: [phone] Pager: [phone]  [name] 04/20/2019, 1:03 PM

## 2019-04-21 LAB — CBC
HCT: 32.1 % — ABNORMAL LOW (ref 36.0–46.0)
Hemoglobin: 10 g/dL — ABNORMAL LOW (ref 12.0–15.0)
MCH: 28.9 pg (ref 26.0–34.0)
MCHC: 31.2 g/dL (ref 30.0–36.0)
MCV: 92.8 fL (ref 80.0–100.0)
Platelets: 217 10*3/uL (ref 150–400)
RBC: 3.46 MIL/uL — ABNORMAL LOW (ref 3.87–5.11)
RDW: 14.6 % (ref 11.5–15.5)
WBC: 11 10*3/uL — ABNORMAL HIGH (ref 4.0–10.5)
nRBC: 0 % (ref 0.0–0.2)

## 2019-04-21 LAB — GLUCOSE, CAPILLARY
Glucose-Capillary: 136 mg/dL — ABNORMAL HIGH (ref 70–99)
Glucose-Capillary: 96 mg/dL (ref 70–99)
Glucose-Capillary: 142 mg/dL — ABNORMAL HIGH (ref 70–99)
Glucose-Capillary: 103 mg/dL — ABNORMAL HIGH (ref 70–99)

## 2019-04-21 NOTE — TOC Transition Note (Signed)
Transition of Care [facility]) - CM/SW Discharge Note   Patient Details  Name: [name] MRN: [id number] Date of Birth: 1945/12/09  [facility]) CM/SW Contact:  Leeroy Cha, RN Phone Number: 04/21/2019, 10:05 AM   Clinical Narrative:     dcd to home with [facility] for [facility] and [facility] for dme  Final next level of care: [place] Barriers to Discharge: No Barriers Identified   Patient Goals and CMS Choice     Choice offered to / list presented to : Patient  Discharge Placement                       Discharge Plan and Services   Discharge Planning Services: CM Consult Post Acute Care Choice: Durable Medical Equipment, Home Health          DME Arranged: Bedside commode, Walker rolling DME Agency: [facility]       HH Arranged: PT [place] Agency: [facility] ([facility])        Social Determinants of Health (SDOH) Interventions     Readmission Risk Interventions No flowsheet data found.

## 2019-04-21 NOTE — Discharge Summary (Deleted)
Patient ID: Angelica KernsShirley A Stangl MRN: [id number] DOB/AGE: 1945/07/01 73 y.o.  Admit date: 04/19/2019 Discharge date: 04/21/2019  Admission Diagnoses:  Principal Problem:   Osteoarthritis of right knee Active Problems:   S/P TKR (total knee replacement), right   Discharge Diagnoses:  Same  Past Medical History:  Diagnosis Date  . Bladder infection   . Carpal tunnel syndrome   . Complication of anesthesia    Difficult to arouse  . Diabetes mellitus   . Hypertension   . Kidney stone   . Kidney stones   . Sleep apnea     Surgeries: Procedure(s): RIGHT TOTAL KNEE ARTHROPLASTY on 04/19/2019   Consultants:   Discharged Condition: Improved  Hospital Course: Angelica KernsShirley A Wyly is an 73 y.o. female who was admitted 04/19/2019 for operative treatment ofOsteoarthritis of right knee. Patient has severe unremitting pain that affects sleep, daily activities, and work/hobbies. After pre-op clearance the patient was taken to the operating room on 04/19/2019 and underwent  Procedure(s): RIGHT TOTAL KNEE ARTHROPLASTY.    Patient was given perioperative antibiotics:  Anti-infectives (From admission, onward)   Start     Dose/Rate Route Frequency Ordered Stop   04/19/19 0745  ceFAZolin (ANCEF) IVPB 2g/100 mL premix     2 g 200 mL/hr over 30 Minutes Intravenous On call to O.R. 04/19/19 [id number] 04/19/19 0934       Patient was given sequential compression devices, early ambulation, and chemoprophylaxis to prevent DVT.  Patient benefited maximally from hospital stay and there were no complications.    Recent vital signs:  Patient Vitals for the past 24 hrs:  BP Temp Temp src Pulse Resp SpO2 Height Weight  04/21/19 0608 128/67 98.3 F (36.8 C) Oral 86 17 90 % - -  04/20/19 2214 (!) 157/77 99.8 F (37.7 C) Oral 90 19 100 % - -  04/20/19 1613 - - - - - - 5\' 3"  (1.6 m) 97.5 kg  04/20/19 1349 135/66 98.4 F (36.9 C) Oral 62 16 100 % - -  04/20/19 0951 (!) 127/59 98.7 F (37.1 C) Oral 68 16  100 % - -     Recent laboratory studies:  Recent Labs    04/20/19 0238 04/21/19 0247  WBC 9.5 11.0*  HGB 10.8* 10.0*  HCT 35.7* 32.1*  PLT 220 217  NA 139  --   K 4.2  --   CL 107  --   CO2 25  --   BUN 10  --   CREATININE 0.71  --   GLUCOSE 120*  --   CALCIUM 8.8*  --      Discharge Medications:   Allergies as of 04/21/2019      Reactions   Sulfa Antibiotics Itching   Sulfonamide Derivatives Itching   Tetanus Toxoids Swelling   Arm swelled at injection site      Medication List    STOP taking these medications   meloxicam 15 MG tablet Commonly known as: MOBIC     TAKE these medications   aspirin EC 81 MG tablet Take 1 tablet (81 mg total) by mouth 2 (two) times daily. What changed:   when to take this  additional instructions   cetirizine 10 MG tablet Commonly known as: ZYRTEC Take 10 mg by mouth daily as needed for allergies.   CoQ10 100 MG Caps Take 100 mg by mouth daily with lunch.   fluticasone 50 MCG/ACT nasal spray Commonly known as: Flonase Place 1 spray into both nostrils daily. What changed:  when to take this  reasons to take this   furosemide 20 MG tablet Commonly known as: LASIX Take 20 mg by mouth daily as needed (fluid).   hydrochlorothiazide 12.5 MG capsule Commonly known as: MICROZIDE Take 12.5 mg by mouth daily as needed (excess fluid/swelling).   losartan 25 MG tablet Commonly known as: COZAAR Take 25 mg by mouth 2 (two) times a week. Take one tablet (25 mg) by mouth twice weekly - Tuesdays and Thursdays   metFORMIN 500 MG tablet Commonly known as: GLUCOPHAGE Take 500 mg by mouth 2 (two) times daily with a meal.   oxyCODONE-acetaminophen 5-325 MG tablet Commonly known as: PERCOCET/ROXICET Take 1 tablet by mouth every 4 (four) hours as needed for severe pain.   Pepcid AC Maximum Strength 20 MG tablet Generic drug: famotidine Take 20 mg by mouth 2 (two) times daily.   potassium chloride SA 20 MEQ  tablet Commonly known as: KLOR-CON Take 1 tablet (20 mEq total) by mouth daily as needed (with each dose of hydrochlorothiazide). What changed: when to take this   pravastatin 80 MG tablet Commonly known as: PRAVACHOL One tab po qweekly on Mondays What changed:   how much to take  how to take this  when to take this   tiZANidine 2 MG tablet Commonly known as: ZANAFLEX Take 1 tablet (2 mg total) by mouth every 6 (six) hours as needed.   Vitamin D (Ergocalciferol) 1.25 MG (50000 UT) Caps capsule Commonly known as: DRISDOL TAKE 1 CAPSULE BY MOUTH ON TUESDAYS AND FRIDAYS   Vitamin D3 50 MCG (2000 UT) Tabs Take 2,000 Units by mouth daily at 12 noon.            Durable Medical Equipment  (From admission, onward)         Start     Ordered   04/19/19 1355  DME Walker rolling  Once    Question:  Patient needs a walker to treat with the following condition  Answer:  Status post right knee replacement   04/19/19 1354   04/19/19 1355  DME 3 n 1  Once     04/19/19 1354           Discharge Care Instructions  (From admission, onward)         Start     Ordered   04/21/19 0000  Weight bearing as tolerated     04/21/19 0102          Diagnostic Studies: Dg Chest 2 View  Result Date: 04/13/2019 CLINICAL DATA:  Preop knee surgery history of hypertension and diabetes EXAM: CHEST - 2 VIEW COMPARISON:  04/05/2018 FINDINGS: The heart size and mediastinal contours are within normal limits. Both lungs are clear. Degenerative changes of the spine. IMPRESSION: No active cardiopulmonary disease. Electronically Signed   By: Donavan Foil M.D.   On: 04/13/2019 16:06    Disposition: Discharge disposition: 01-Home or Self Care       Discharge Instructions    Call MD / Call 911   Complete by: As directed    If you experience chest pain or shortness of breath, CALL 911 and be transported to the hospital emergency room.  If you develope a fever above 101 F, pus (white  drainage) or increased drainage or redness at the wound, or calf pain, call your surgeon's office.   Constipation Prevention   Complete by: As directed    Drink plenty of fluids.  Prune juice may be helpful.  You may  use a stool softener, such as Colace (over the counter) 100 mg twice a day.  Use MiraLax (over the counter) for constipation as needed.   Diet - low sodium heart healthy   Complete by: As directed    Driving restrictions   Complete by: As directed    No driving for 2 weeks   Increase activity slowly as tolerated   Complete by: As directed    Patient may shower   Complete by: As directed    You may shower without a dressing once there is no drainage.  Do not wash over the wound.  If drainage remains, cover wound with plastic wrap and then shower.   Weight bearing as tolerated   Complete by: As directed       Follow-up Information    Gean Birchwood, MD. Go on 05/04/2019.   Specialty: Orthopedic Surgery Why: Your appointment is scheduled for 9:15   Contact information: [address] [place] [address] [phone]        [facility] Follow up.   Why: You will be seen at home by HHPT for 5 visits prior to starting Outpatient physical therapy         [facility] Go on 05/04/2019.   Why: You are scheduled to start therapy after your MD appointment. Please go over to the therapy desk to complete your paperwork.  Contact information: Physical Therapy [address] [place] [address] [phone]            Signed: Dannielle Burn 04/21/2019, 8:09 AM

## 2019-04-21 NOTE — Progress Notes (Signed)
Physical Therapy Treatment Patient Details Name: [name] MRN: [id number] DOB: 24-Oct-1945 Today's Date: 04/21/2019    History of Present Illness s/p R TKA    PT Comments    Pt ambulated in hallway only 21 feet and continues to require at least min assist.  Pt tends to fatigue quickly and require recliner without much warning.  Pt performed a few exercises upon returning to supine.   Follow Up Recommendations  Follow surgeon's recommendation for DC plan and follow-up therapies;Supervision/Assistance - 24 hour     Equipment Recommendations  Rolling walker with 5" wheels;3in1 (PT)    Recommendations for Other Services       Precautions / Restrictions Precautions Precautions: Knee;Fall Restrictions Weight Bearing Restrictions: No    Mobility  Bed Mobility Overal bed mobility: Needs Assistance Bed Mobility: Sit to Supine     Supine to sit: Min guard Sit to supine: Min guard   General bed mobility comments: increased time and effort, pt used gait belt to self assist LE  Transfers Overall transfer level: Needs assistance Equipment used: Rolling walker (2 wheeled) Transfers: Sit to/from Stand Sit to Stand: Min assist Stand pivot transfers: Min assist       General transfer comment: verbal cues for UE and LE positioning, pt required assist to rise, steady and control descent  Ambulation/Gait Ambulation/Gait assistance: Min assist Gait Distance (Feet): 21 Feet Assistive device: Rolling walker (2 wheeled) Gait Pattern/deviations: Step-to pattern;Decreased stance time - right;Antalgic Gait velocity: decreased   General Gait Details: verbal cues for sequence, RW positioning, step length; pt fatigued quickly again and required recliner   Stairs             Wheelchair Mobility    Modified Rankin (Stroke Patients Only)       Balance                                            Cognition Arousal/Alertness: Awake/alert Behavior  During Therapy: WFL for tasks assessed/performed Overall Cognitive Status: Within Functional Limits for tasks assessed                                        Exercises Total Joint Exercises Ankle Circles/Pumps: AROM;10 reps;Both Quad Sets: AROM;Right;10 reps Heel Slides: AAROM;Right;10 reps Hip ABduction/ADduction: AAROM;Right;10 reps Straight Leg Raises: AAROM;Right;10 reps    General Comments        Pertinent Vitals/Pain Pain Assessment: 0-10 Pain Score: 6  Pain Location: right knee Pain Descriptors / Indicators: Grimacing;Sore;Aching Pain Intervention(s): Limited activity within patient's tolerance;Monitored during session;Repositioned;Ice applied    Home Living                      Prior Function            PT Goals (current goals can now be found in the care plan section) Progress towards PT goals: Progressing toward goals(slowly)    Frequency    7X/week      PT Plan Current plan remains appropriate    Co-evaluation              AM-PAC PT "6 Clicks" Mobility   Outcome Measure  Help needed turning from your back to your side while in a flat bed without using bedrails?: A Little Help needed  moving from lying on your back to sitting on the side of a flat bed without using bedrails?: A Little Help needed moving to and from a bed to a chair (including a wheelchair)?: A Little Help needed standing up from a chair using your arms (e.g., wheelchair or bedside chair)?: A Little Help needed to walk in hospital room?: A Lot Help needed climbing 3-5 steps with a railing? : Total 6 Click Score: 15    End of Session Equipment Utilized During Treatment: Gait belt Activity Tolerance: Patient limited by fatigue Patient left: in bed;with call bell/phone within reach;with bed alarm set   PT Visit Diagnosis: Difficulty in walking, not elsewhere classified (R26.2)     Time: 1437-1500 PT Time Calculation (min) (ACUTE ONLY): 23  min  Charges:  $Gait Training: 8-22 mins $Therapeutic Exercise: 8-22 mins                     Zenovia Jarred, PT, DPT Acute Rehabilitation Services Office: [phone] Pager: [phone]  Sarajane Jews 04/21/2019, 3:38 PM

## 2019-04-21 NOTE — Progress Notes (Signed)
Physical Therapy Treatment Patient Details Name: [name] MRN: [id number] DOB: Nov 03, 1945 Today's Date: 04/21/2019    History of Present Illness s/p R TKA    PT Comments    Pt continues to fatigue quickly and requires at least min assist for mobility.  Pt ambulated short distance and assisted to [facility].  Pt's daughter present and states she cannot physically assist pt upon d/c home.  Pt does not appear to safe to d/c home at this time.   Follow Up Recommendations  Follow surgeon's recommendation for DC plan and follow-up therapies;Supervision/Assistance - 24 hour     Equipment Recommendations  Rolling walker with 5" wheels;3in1 (PT)    Recommendations for Other Services       Precautions / Restrictions Precautions Precautions: Knee;Fall Restrictions Weight Bearing Restrictions: No    Mobility  Bed Mobility Overal bed mobility: Needs Assistance Bed Mobility: Supine to Sit     Supine to sit: Min guard     General bed mobility comments: increased time and effort, pt used gait belt to self assist LE  Transfers Overall transfer level: Needs assistance Equipment used: Rolling walker (2 wheeled) Transfers: Sit to/from Stand Sit to Stand: Min assist Stand pivot transfers: Min assist       General transfer comment: verbal cues for UE and LE positioning, pt required assist to rise, steady and control descent  Ambulation/Gait Ambulation/Gait assistance: Min assist Gait Distance (Feet): 10 Feet Assistive device: Rolling walker (2 wheeled) Gait Pattern/deviations: Step-to pattern;Decreased stance time - right;Antalgic Gait velocity: decreased   General Gait Details: verbal cues for sequence, RW positioning, step length; pt fatigued quickly again and required recliner   Stairs             Wheelchair Mobility    Modified Rankin (Stroke Patients Only)       Balance                                            Cognition  Arousal/Alertness: Awake/alert Behavior During Therapy: WFL for tasks assessed/performed Overall Cognitive Status: Within Functional Limits for tasks assessed                                        Exercises      General Comments        Pertinent Vitals/Pain Pain Assessment: 0-10 Pain Score: 6  Pain Location: right knee Pain Descriptors / Indicators: Grimacing;Sore;Aching Pain Intervention(s): Monitored during session;Repositioned    Home Living                      Prior Function            PT Goals (current goals can now be found in the care plan section) Progress towards PT goals: Progressing toward goals    Frequency    7X/week      PT Plan Current plan remains appropriate    Co-evaluation              AM-PAC PT "6 Clicks" Mobility   Outcome Measure  Help needed turning from your back to your side while in a flat bed without using bedrails?: A Little Help needed moving from lying on your back to sitting on the side of a flat bed without using bedrails?:  A Little Help needed moving to and from a bed to a chair (including a wheelchair)?: A Little Help needed standing up from a chair using your arms (e.g., wheelchair or bedside chair)?: A Little Help needed to walk in hospital room?: A Lot Help needed climbing 3-5 steps with a railing? : Total 6 Click Score: 15    End of Session Equipment Utilized During Treatment: Gait belt Activity Tolerance: Patient limited by fatigue Patient left: with call bell/phone within reach;in chair;with chair alarm set;with family/visitor present   PT Visit Diagnosis: Difficulty in walking, not elsewhere classified (R26.2)     Time: 1010-1030 PT Time Calculation (min) (ACUTE ONLY): 20 min  Charges:  $Gait Training: 8-22 mins                     Carmelia Bake, PT, [place] Office: [phone] Pager: [place] 04/21/2019, 12:59 PM

## 2019-04-21 NOTE — Progress Notes (Signed)
PATIENT ID: [name]  MRN: [id number]  DOB/AGE:  74-Jun-1947 / 74 y.o.  2 Days Post-Op Procedure(s) (LRB): RIGHT TOTAL KNEE ARTHROPLASTY (Right)    PROGRESS NOTE Subjective: Patient is alert, oriented, no Nausea, no Vomiting, yes passing gas. Taking PO well. Denies SOB, Chest or Calf Pain. Using Incentive Spirometer, PAS in place. Ambulate WBAT with pt walking 15 ft with therapy, Patient reports pain as 6/10.    Objective: Vital signs in last 24 hours: Vitals:   04/20/19 1349 04/20/19 1613 04/20/19 2214 04/21/19 0608  BP: 135/66  (!) 157/77 128/67  Pulse: 62  90 86  Resp: 16  19 17   Temp: 98.4 F (36.9 C)  99.8 F (37.7 C) 98.3 F (36.8 C)  TempSrc: Oral  Oral Oral  SpO2: 100%  100% 90%  Weight:  97.5 kg    Height:  5\' 3"  (1.6 m)        Intake/Output from previous day: I/O last 3 completed shifts: In: 2835.1 [P.O.:1380; I.V.:1455.1] Out: 5150 [Urine:5150]   Intake/Output this shift: No intake/output data recorded.   LABORATORY DATA: Recent Labs    04/20/19 0238  04/20/19 1631 04/20/19 2216 04/21/19 0247 04/21/19 0734  WBC 9.5  --   --   --  11.0*  --   HGB 10.8*  --   --   --  10.0*  --   HCT 35.7*  --   --   --  32.1*  --   PLT 220  --   --   --  217  --   NA 139  --   --   --   --   --   K 4.2  --   --   --   --   --   CL 107  --   --   --   --   --   CO2 25  --   --   --   --   --   BUN 10  --   --   --   --   --   CREATININE 0.71  --   --   --   --   --   GLUCOSE 120*  --   --   --   --   --   GLUCAP  --    < > 116* 120*  --  136*  CALCIUM 8.8*  --   --   --   --   --    < > = values in this interval not displayed.    Examination: Neurologically intact Neurovascular intact Sensation intact distally Intact pulses distally Dorsiflexion/Plantar flexion intact Incision: dressing C/D/I and scant drainage No cellulitis present Compartment soft}  Assessment:   2 Days Post-Op Procedure(s) (LRB): RIGHT TOTAL KNEE ARTHROPLASTY (Right) ADDITIONAL  DIAGNOSIS: Expected Acute Blood Loss Anemia, Diabetes, Hypertension and Sleep Apnea, morbid obesity Anticipated LOS equal to or greater than 2 midnights due to - Age 74 and older with one or more of the following:  - Obesity  - Expected need for hospital services (PT, OT, Nursing) required for safe  discharge  - Anticipated need for postoperative skilled nursing care or inpatient rehab  - Active co-morbidities: Diabetes OR   - Unanticipated findings during/Post Surgery: Slow post-op progression: GI, pain control, mobility      Plan: PT/OT WBAT, AROM and PROM  DVT Prophylaxis:  SCDx72hrs, ASA 81 mg BID x 2 weeks DISCHARGE PLAN: Home, later today once pt meets  therapy goals DISCHARGE NEEDS: HHPT, Walker and 3-in-1 comode seat     Joanell Rising 04/21/2019, 8:04 AM

## 2019-04-22 LAB — CBC
HCT: 32.9 % — ABNORMAL LOW (ref 36.0–46.0)
Hemoglobin: 10.2 g/dL — ABNORMAL LOW (ref 12.0–15.0)
MCH: 29.1 pg (ref 26.0–34.0)
MCHC: 31 g/dL (ref 30.0–36.0)
MCV: 93.7 fL (ref 80.0–100.0)
Platelets: 205 10*3/uL (ref 150–400)
RBC: 3.51 MIL/uL — ABNORMAL LOW (ref 3.87–5.11)
RDW: 14.6 % (ref 11.5–15.5)
WBC: 10.5 10*3/uL (ref 4.0–10.5)
nRBC: 0 % (ref 0.0–0.2)

## 2019-04-22 LAB — GLUCOSE, CAPILLARY
Glucose-Capillary: 114 mg/dL — ABNORMAL HIGH (ref 70–99)
Glucose-Capillary: 121 mg/dL — ABNORMAL HIGH (ref 70–99)
Glucose-Capillary: 98 mg/dL (ref 70–99)
Glucose-Capillary: 138 mg/dL — ABNORMAL HIGH (ref 70–99)

## 2019-04-22 NOTE — Discharge Summary (Addendum)
Patient ID: [name] MRN: [id number] DOB/AGE: 1945/11/09 73 y.o.  Admit date: 04/19/2019 Discharge date: 04/23/2019 Admission Diagnoses:  Principal Problem:   Osteoarthritis of right knee Active Problems:   S/P TKR (total knee replacement), right   Discharge Diagnoses:  Same  Past Medical History:  Diagnosis Date  . Bladder infection   . Carpal tunnel syndrome   . Complication of anesthesia    Difficult to arouse  . Diabetes mellitus   . Hypertension   . Kidney stone   . Kidney stones   . Sleep apnea     Surgeries: Procedure(s): RIGHT TOTAL KNEE ARTHROPLASTY on 04/19/2019   Consultants:   Discharged Condition: Improved  Hospital Course: BRIYANNA BILLINGHAM is an 73 y.o. female who was admitted 04/19/2019 for operative treatment ofOsteoarthritis of right knee. Patient has severe unremitting pain that affects sleep, daily activities, and work/hobbies. After pre-op clearance the patient was taken to the operating room on 04/19/2019 and underwent  Procedure(s): RIGHT TOTAL KNEE ARTHROPLASTY.    Patient was given perioperative antibiotics:  Anti-infectives (From admission, onward)   Start     Dose/Rate Route Frequency Ordered Stop   04/19/19 0745  ceFAZolin (ANCEF) IVPB 2g/100 mL premix     2 g 200 mL/hr over 30 Minutes Intravenous On call to O.R. 04/19/19 4627 04/19/19 0934       Patient was given sequential compression devices, early ambulation, and chemoprophylaxis to prevent DVT.  Patient benefited maximally from hospital stay and there were no complications.    Recent vital signs:  Patient Vitals for the past 24 hrs:  BP Temp Temp src Pulse Resp SpO2  04/22/19 0513 135/68 99.1 F (37.3 C) - 85 17 100 %  04/21/19 2131 (!) 143/72 99.7 F (37.6 C) - 98 18 95 %  04/21/19 1400 140/70 97.6 F (36.4 C) Oral 83 - 97 %  04/21/19 0844 - - - - - (!) 89 %     Recent laboratory studies:  Recent Labs    04/20/19 0238 04/21/19 0247 04/22/19 0255  WBC 9.5  11.0* 10.5  HGB 10.8* 10.0* 10.2*  HCT 35.7* 32.1* 32.9*  PLT 220 217 205  NA 139  --   --   K 4.2  --   --   CL 107  --   --   CO2 25  --   --   BUN 10  --   --   CREATININE 0.71  --   --   GLUCOSE 120*  --   --   CALCIUM 8.8*  --   --      Discharge Medications:   Allergies as of 04/22/2019      Reactions   Sulfa Antibiotics Itching   Sulfonamide Derivatives Itching   Tetanus Toxoids Swelling   Arm swelled at injection site      Medication List    STOP taking these medications   meloxicam 15 MG tablet Commonly known as: MOBIC     TAKE these medications   aspirin EC 81 MG tablet Take 1 tablet (81 mg total) by mouth 2 (two) times daily. What changed:   when to take this  additional instructions   cetirizine 10 MG tablet Commonly known as: ZYRTEC Take 10 mg by mouth daily as needed for allergies.   CoQ10 100 MG Caps Take 100 mg by mouth daily with lunch.   fluticasone 50 MCG/ACT nasal spray Commonly known as: Flonase Place 1 spray into both nostrils daily. What changed:  when to take this  reasons to take this   furosemide 20 MG tablet Commonly known as: LASIX Take 20 mg by mouth daily as needed (fluid).   hydrochlorothiazide 12.5 MG capsule Commonly known as: MICROZIDE Take 12.5 mg by mouth daily as needed (excess fluid/swelling).   losartan 25 MG tablet Commonly known as: COZAAR Take 25 mg by mouth 2 (two) times a week. Take one tablet (25 mg) by mouth twice weekly - Tuesdays and Thursdays   metFORMIN 500 MG tablet Commonly known as: GLUCOPHAGE Take 500 mg by mouth 2 (two) times daily with a meal.   oxyCODONE-acetaminophen 5-325 MG tablet Commonly known as: PERCOCET/ROXICET Take 1 tablet by mouth every 4 (four) hours as needed for severe pain.   Pepcid AC Maximum Strength 20 MG tablet Generic drug: famotidine Take 20 mg by mouth 2 (two) times daily.   potassium chloride SA 20 MEQ tablet Commonly known as: KLOR-CON Take 1 tablet (20  mEq total) by mouth daily as needed (with each dose of hydrochlorothiazide). What changed: when to take this   pravastatin 80 MG tablet Commonly known as: PRAVACHOL One tab po qweekly on Mondays What changed:   how much to take  how to take this  when to take this   tiZANidine 2 MG tablet Commonly known as: ZANAFLEX Take 1 tablet (2 mg total) by mouth every 6 (six) hours as needed.   Vitamin D (Ergocalciferol) 1.25 MG (50000 UT) Caps capsule Commonly known as: DRISDOL TAKE 1 CAPSULE BY MOUTH ON TUESDAYS AND FRIDAYS   Vitamin D3 50 MCG (2000 UT) Tabs Take 2,000 Units by mouth daily at 12 noon.            Durable Medical Equipment  (From admission, onward)         Start     Ordered   04/19/19 1355  DME Walker rolling  Once    Question:  Patient needs a walker to treat with the following condition  Answer:  Status post right knee replacement   04/19/19 1354   04/19/19 1355  DME 3 n 1  Once     04/19/19 1354           Discharge Care Instructions  (From admission, onward)         Start     Ordered   04/22/19 0000  Weight bearing as tolerated     04/22/19 0822   04/21/19 0000  Weight bearing as tolerated     04/21/19 7829          Diagnostic Studies: Dg Chest 2 View  Result Date: 04/13/2019 CLINICAL DATA:  Preop knee surgery history of hypertension and diabetes EXAM: CHEST - 2 VIEW COMPARISON:  04/05/2018 FINDINGS: The heart size and mediastinal contours are within normal limits. Both lungs are clear. Degenerative changes of the spine. IMPRESSION: No active cardiopulmonary disease. Electronically Signed   By: Jasmine Pang M.D.   On: 04/13/2019 16:06    Disposition: Discharge disposition: 01-Home or Self Care       Discharge Instructions    Call MD / Call 911   Complete by: As directed    If you experience chest pain or shortness of breath, CALL 911 and be transported to the hospital emergency room.  If you develope a fever above 101 F, pus  (white drainage) or increased drainage or redness at the wound, or calf pain, call your surgeon's office.   Call MD / Call 911   Complete by:  As directed    If you experience chest pain or shortness of breath, CALL 911 and be transported to the hospital emergency room.  If you develope a fever above 101 F, pus (white drainage) or increased drainage or redness at the wound, or calf pain, call your surgeon's office.   Constipation Prevention   Complete by: As directed    Drink plenty of fluids.  Prune juice may be helpful.  You may use a stool softener, such as Colace (over the counter) 100 mg twice a day.  Use MiraLax (over the counter) for constipation as needed.   Constipation Prevention   Complete by: As directed    Drink plenty of fluids.  Prune juice may be helpful.  You may use a stool softener, such as Colace (over the counter) 100 mg twice a day.  Use MiraLax (over the counter) for constipation as needed.   Diet - low sodium heart healthy   Complete by: As directed    Driving restrictions   Complete by: As directed    No driving for 2 weeks   Driving restrictions   Complete by: As directed    No driving for 2 weeks   Increase activity slowly as tolerated   Complete by: As directed    Increase activity slowly as tolerated   Complete by: As directed    Patient may shower   Complete by: As directed    You may shower without a dressing once there is no drainage.  Do not wash over the wound.  If drainage remains, cover wound with plastic wrap and then shower.   Patient may shower   Complete by: As directed    You may shower without a dressing once there is no drainage.  Do not wash over the wound.  If drainage remains, cover wound with plastic wrap and then shower.   Weight bearing as tolerated   Complete by: As directed    Weight bearing as tolerated   Complete by: As directed       Follow-up Information    Gean Birchwoodowan, Frank, MD. Go on 05/04/2019.   Specialty: Orthopedic  Surgery Why: Your appointment is scheduled for 9:15   Contact information: [address] [place] [address] [phone]        [facility] Follow up.   Why: You will be seen at home by HHPT for 5 visits prior to starting Outpatient physical therapy         [facility] Go on 05/04/2019.   Why: You are scheduled to start therapy after your MD appointment. Please go over to the therapy desk to complete your paperwork.  Contact information: Physical Therapy [address] [place] [address] [phone]            Signed: Dannielle Burnric Deserai Cansler 04/22/2019, 8:22 AM

## 2019-04-22 NOTE — Progress Notes (Signed)
PATIENT ID: [name]  MRN: [id number]  DOB/AGE:  [birth year] / 73 y.o.  3 Days Post-Op Procedure(s) (LRB): RIGHT TOTAL KNEE ARTHROPLASTY (Right)    PROGRESS NOTE Subjective: Patient is alert, oriented, no Nausea, no Vomiting, yes passing gas. Taking PO well. Denies SOB, Chest or Calf Pain. Using Incentive Spirometer, PAS in place. Ambulate WBAT with pt walking 21 ft, Patient reports pain as 6/10 .    Objective: Vital signs in last 24 hours: Vitals:   04/21/19 0844 04/21/19 1400 04/21/19 2131 04/22/19 0513  BP:  140/70 (!) 143/72 135/68  Pulse:  83 98 85  Resp:   18 17  Temp:  97.6 F (36.4 C) 99.7 F (37.6 C) 99.1 F (37.3 C)  TempSrc:  Oral    SpO2: (!) 89% 97% 95% 100%  Weight:      Height:          Intake/Output from previous day: I/O last 3 completed shifts: In: 1500 [P.O.:1500] Out: 1470 [Urine:1470]   Intake/Output this shift: Total I/O In: -  Out: 200 [Urine:200]   LABORATORY DATA: Recent Labs    04/20/19 0238  04/21/19 0247  04/21/19 1614 04/21/19 2132 04/22/19 0255 04/22/19 0735  WBC 9.5  --  11.0*  --   --   --  10.5  --   HGB 10.8*  --  10.0*  --   --   --  10.2*  --   HCT 35.7*  --  32.1*  --   --   --  32.9*  --   PLT 220  --  217  --   --   --  205  --   NA 139  --   --   --   --   --   --   --   K 4.2  --   --   --   --   --   --   --   CL 107  --   --   --   --   --   --   --   CO2 25  --   --   --   --   --   --   --   BUN 10  --   --   --   --   --   --   --   CREATININE 0.71  --   --   --   --   --   --   --   GLUCOSE 120*  --   --   --   --   --   --   --   GLUCAP  --    < >  --    < > 142* 103*  --  114*  CALCIUM 8.8*  --   --   --   --   --   --   --    < > = values in this interval not displayed.    Examination: Neurologically intact Neurovascular intact Sensation intact distally Intact pulses distally Dorsiflexion/Plantar flexion intact Incision: dressing C/D/I and scant drainage No cellulitis present Compartment  soft}  Assessment:   3 Days Post-Op Procedure(s) (LRB): RIGHT TOTAL KNEE ARTHROPLASTY (Right) ADDITIONAL DIAGNOSIS: Expected Acute Blood Loss Anemia, Diabetes, Hypertension and Sleep Apnea, morbid obesity Anticipated LOS equal to or greater than 2 midnights due to - Age 74 and older with one or more of the following:  - Obesity  - Expected need for  hospital services (PT, OT, Nursing) required for safe  discharge  - Anticipated need for postoperative skilled nursing care or inpatient rehab  - Active co-morbidities: Diabetes OR   - Unanticipated findings during/Post Surgery: Slow post-op progression: GI, pain control, mobility     Plan: PT/OT WBAT, AROM and PROM  DVT Prophylaxis:  SCDx72hrs, ASA 81 mg BID x 2 weeks DISCHARGE PLAN: Home today DISCHARGE NEEDS: HHPT, Walker and 3-in-1 comode seat     Joanell Rising 04/22/2019, 8:19 AM

## 2019-04-22 NOTE — Progress Notes (Signed)
Physical Therapy Treatment Patient Details Name: [name] MRN: [id number] DOB: 1945/11/28 Today's Date: 04/22/2019    History of Present Illness s/p R TKA    PT Comments    Pt continues cooperative but fatigues easily and progressing very slowly with mobility.  This date, pt ambulated increased distance in hall but with one episode balance loss requiring assist to prevent fall - pt c/o lightheadedness - BP 127/88 - RN aware.   Follow Up Recommendations  Follow surgeon's recommendation for DC plan and follow-up therapies;Supervision/Assistance - 24 hour     Equipment Recommendations  Rolling walker with 5" wheels;3in1 (PT)    Recommendations for Other Services       Precautions / Restrictions Precautions Precautions: Knee;Fall Restrictions Weight Bearing Restrictions: No Other Position/Activity Restrictions: WBAT    Mobility  Bed Mobility Overal bed mobility: Needs Assistance Bed Mobility: Supine to Sit;Sit to Supine     Supine to sit: Min guard Sit to supine: Min assist   General bed mobility comments: increased time, use of bedrails and use of belt to assist R LE; pt requiring increased assist into bed with bed set to height of bed at home  Transfers Overall transfer level: Needs assistance Equipment used: Rolling walker (2 wheeled) Transfers: Sit to/from Stand Sit to Stand: Min assist         General transfer comment: verbal cues for UE and LE positioning, pt required assist to rise, steady and control descent  Ambulation/Gait Ambulation/Gait assistance: Min assist Gait Distance (Feet): 58 Feet Assistive device: Rolling walker (2 wheeled) Gait Pattern/deviations: Step-to pattern;Decreased step length - right;Decreased step length - left;Shuffle;Trunk flexed;Decreased stride length Gait velocity: decreased   General Gait Details: cues for sequence, posture, position from RW and increased UE WB; pt with c/o lightheadedness and with loss of balance  requiring assist to correct - BP 127/88   Stairs             Wheelchair Mobility    Modified Rankin (Stroke Patients Only)       Balance Overall balance assessment: Needs assistance Sitting-balance support: No upper extremity supported;Feet supported Sitting balance-Leahy Scale: Good     Standing balance support: Bilateral upper extremity supported Standing balance-Leahy Scale: Poor                              Cognition Arousal/Alertness: Awake/alert Behavior During Therapy: WFL for tasks assessed/performed Overall Cognitive Status: Within Functional Limits for tasks assessed                                        Exercises      General Comments        Pertinent Vitals/Pain Pain Assessment: 0-10 Pain Score: 5  Pain Location: right knee Pain Descriptors / Indicators: Grimacing;Sore;Aching Pain Intervention(s): Limited activity within patient's tolerance;Monitored during session;Premedicated before session;Ice applied    Home Living                      Prior Function            PT Goals (current goals can now be found in the care plan section) Acute Rehab PT Goals PT Goal Formulation: With patient Time For Goal Achievement: 04/26/19 Potential to Achieve Goals: Good Progress towards PT goals: Progressing toward goals    Frequency    7X/week  PT Plan Current plan remains appropriate    Co-evaluation              AM-PAC PT "6 Clicks" Mobility   Outcome Measure  Help needed turning from your back to your side while in a flat bed without using bedrails?: A Little Help needed moving from lying on your back to sitting on the side of a flat bed without using bedrails?: A Little Help needed moving to and from a bed to a chair (including a wheelchair)?: A Little Help needed standing up from a chair using your arms (e.g., wheelchair or bedside chair)?: A Little Help needed to walk in hospital room?: A  Lot Help needed climbing 3-5 steps with a railing? : A Lot 6 Click Score: 16    End of Session Equipment Utilized During Treatment: Gait belt Activity Tolerance: Patient limited by fatigue;Patient limited by pain Patient left: in bed;with call bell/phone within reach;with bed alarm set Nurse Communication: Mobility status PT Visit Diagnosis: Difficulty in walking, not elsewhere classified (R26.2)     Time: 1455-1520 PT Time Calculation (min) (ACUTE ONLY): 25 min  Charges:  $Gait Training: 23-37 mins                     [place] Pager [phone] Office [phone]    Mariko Nowakowski 04/22/2019, 4:16 PM

## 2019-04-22 NOTE — Care Plan (Signed)
Following PT progression. Have notified [facility] that patient needs to be seen tomorrow at home for eval and treatment and have added HHOT for treatment as well. Patient has equipment to take home with her    Ladell Heads, [place]

## 2019-04-22 NOTE — Progress Notes (Signed)
Physical Therapy Treatment Patient Details Name: [name] MRN: [id number] DOB: September 01, 1945 Today's Date: 04/22/2019    History of Present Illness s/p R TKA    PT Comments    Pt continues cooperative but progressing very slowly with therex and mobility.  This date, pt ambulated 78' with RW and assist but with noted mild buckling by end 2* fatigue.   Follow Up Recommendations  Follow surgeon's recommendation for DC plan and follow-up therapies;Supervision/Assistance - 24 hour     Equipment Recommendations  Rolling walker with 5" wheels;3in1 (PT)    Recommendations for Other Services       Precautions / Restrictions Precautions Precautions: Knee;Fall Restrictions Weight Bearing Restrictions: No Other Position/Activity Restrictions: WBAT    Mobility  Bed Mobility Overal bed mobility: Needs Assistance Bed Mobility: Sit to Supine       Sit to supine: Min guard   General bed mobility comments: increased time and use of bedrails  Transfers Overall transfer level: Needs assistance Equipment used: Rolling walker (2 wheeled) Transfers: Sit to/from Stand Sit to Stand: Min assist         General transfer comment: verbal cues for UE and LE positioning, pt required assist to rise, steady and control descent  Ambulation/Gait Ambulation/Gait assistance: Min assist Gait Distance (Feet): 34 Feet Assistive device: Rolling walker (2 wheeled) Gait Pattern/deviations: Step-to pattern;Decreased step length - right;Decreased step length - left;Shuffle;Trunk flexed;Decreased stride length Gait velocity: decreased   General Gait Details: cues for sequence, posture, position from RW and increased UE WB; noted mild buckling at knees with increasing fatigue   Stairs             Wheelchair Mobility    Modified Rankin (Stroke Patients Only)       Balance Overall balance assessment: Needs assistance Sitting-balance support: No upper extremity supported;Feet  supported Sitting balance-Leahy Scale: Good     Standing balance support: Bilateral upper extremity supported Standing balance-Leahy Scale: Poor                              Cognition Arousal/Alertness: Awake/alert Behavior During Therapy: WFL for tasks assessed/performed Overall Cognitive Status: Within Functional Limits for tasks assessed                                        Exercises Total Joint Exercises Ankle Circles/Pumps: AROM;10 reps;Both Quad Sets: AROM;Right;10 reps;Supine Heel Slides: AAROM;Right;15 reps;Supine Straight Leg Raises: AAROM;Right;15 reps;Supine Goniometric ROM: AAROM R knee -5 - 30 with muscle guarding    General Comments        Pertinent Vitals/Pain Pain Assessment: 0-10 Pain Score: 6  Pain Location: right knee Pain Descriptors / Indicators: Grimacing;Sore;Aching Pain Intervention(s): Limited activity within patient's tolerance;Monitored during session;Premedicated before session;Ice applied    Home Living                      Prior Function            PT Goals (current goals can now be found in the care plan section) Acute Rehab PT Goals PT Goal Formulation: With patient Time For Goal Achievement: 04/26/19 Potential to Achieve Goals: Good Progress towards PT goals: Progressing toward goals    Frequency    7X/week      PT Plan Current plan remains appropriate    Co-evaluation  AM-PAC PT "6 Clicks" Mobility   Outcome Measure  Help needed turning from your back to your side while in a flat bed without using bedrails?: A Little Help needed moving from lying on your back to sitting on the side of a flat bed without using bedrails?: A Little Help needed moving to and from a bed to a chair (including a wheelchair)?: A Little Help needed standing up from a chair using your arms (e.g., wheelchair or bedside chair)?: A Little Help needed to walk in hospital room?: A Lot Help  needed climbing 3-5 steps with a railing? : A Lot 6 Click Score: 16    End of Session Equipment Utilized During Treatment: Gait belt Activity Tolerance: Patient limited by fatigue;Patient limited by pain Patient left: in bed;with call bell/phone within reach;with bed alarm set Nurse Communication: Mobility status PT Visit Diagnosis: Difficulty in walking, not elsewhere classified (R26.2)     Time: 3612-2449 PT Time Calculation (min) (ACUTE ONLY): 32 min  Charges:  $Gait Training: 8-22 mins $Therapeutic Exercise: 8-22 mins                     Mauro Kaufmann PT Acute Rehabilitation Services Pager [phone] Office [phone]    [name] 04/22/2019, 12:58 PM

## 2019-04-23 DIAGNOSIS — M1711 Unilateral primary osteoarthritis, right knee: Secondary | ICD-10-CM | POA: Diagnosis not present

## 2019-04-23 DIAGNOSIS — Z96651 Presence of right artificial knee joint: Secondary | ICD-10-CM | POA: Diagnosis not present

## 2019-04-23 LAB — GLUCOSE, CAPILLARY
Glucose-Capillary: 79 mg/dL (ref 70–99)
Glucose-Capillary: 104 mg/dL — ABNORMAL HIGH (ref 70–99)
Glucose-Capillary: 117 mg/dL — ABNORMAL HIGH (ref 70–99)
Glucose-Capillary: 125 mg/dL — ABNORMAL HIGH (ref 70–99)

## 2019-04-23 NOTE — Care Management Important Message (Signed)
Important Message  Patient Details IM Letter given to Kathrin Greathouse SW to present to the Patient Name: [name] MRN: [id number] Date of Birth: 09-22-1945   Medicare Important Message Given:  Yes     Kerin Salen 04/23/2019, 2:37 PM

## 2019-04-23 NOTE — Progress Notes (Signed)
PATIENT ID: [name]  MRN: [id number]  DOB/AGE:  74-Sep-1947 / 73 y.o.  4 Days Post-Op Procedure(s) (LRB): RIGHT TOTAL KNEE ARTHROPLASTY (Right)    PROGRESS NOTE Subjective: Patient is alert, oriented, no Nausea, no Vomiting, yes passing gas. Taking PO well. Denies SOB, Chest or Calf Pain. Using Incentive Spirometer, PAS in place. Ambulate WBAT with pt walking 54 ft and making slow progress, Patient reports pain as 6/10 With sharp pains in the tibia.    Objective: Vital signs in last 24 hours: Vitals:   04/22/19 0513 04/22/19 1418 04/22/19 2231 04/23/19 0638  BP: 135/68 122/61 136/62 99/63  Pulse: 85 84 91 72  Resp: 17 16 16 18   Temp: 99.1 F (37.3 C) 99.3 F (37.4 C) (!) 101.2 F (38.4 C) 98.6 F (37 C)  TempSrc:  Oral Oral Oral  SpO2: 100% 96% 98% 97%  Weight:      Height:          Intake/Output from previous day: I/O last 3 completed shifts: In: 1020 [P.O.:1020] Out: 400 [Urine:400]   Intake/Output this shift: No intake/output data recorded.   LABORATORY DATA: Recent Labs    04/21/19 0247  04/22/19 0255  04/22/19 1626 04/22/19 2302 04/23/19 0736  WBC 11.0*  --  10.5  --   --   --   --   HGB 10.0*  --  10.2*  --   --   --   --   HCT 32.1*  --  32.9*  --   --   --   --   PLT 217  --  205  --   --   --   --   GLUCAP  --    < >  --    < > 98 138* 79   < > = values in this interval not displayed.    Examination: Neurologically intact Neurovascular intact Sensation intact distally Intact pulses distally Dorsiflexion/Plantar flexion intact Incision: dressing C/D/I and scant drainage No cellulitis present Compartment soft}  Assessment:   4 Days Post-Op Procedure(s) (LRB): RIGHT TOTAL KNEE ARTHROPLASTY (Right) ADDITIONAL DIAGNOSIS: Expected Acute Blood Loss Anemia,  Diabetes, Hypertension and Sleep Apnea, morbid obesity Anticipated LOS equal to or greater than 2 midnights due to - Age 74 and older with one or more of the following:  - Obesity  -  Expected need for hospital services (PT, OT, Nursing) required for safe  discharge  - Anticipated need for postoperative skilled nursing care or inpatient rehab  - Active co-morbidities: Diabetes OR   - Unanticipated findings during/Post Surgery: Slow post-op progression: GI, pain control, mobility    Plan: PT/OT WBAT, AROM and PROM  DVT Prophylaxis:  SCDx72hrs, ASA 81 mg BID x 2 weeks DISCHARGE PLAN: Home DISCHARGE NEEDS: HHPT, Walker and 3-in-1 comode seat     Joanell Rising 04/23/2019, 10:23 AM

## 2019-04-23 NOTE — Progress Notes (Signed)
Physical Therapy Treatment Patient Details Name: [name] MRN: [id number] DOB: 03/13/1946 Today's Date: 04/23/2019    History of Present Illness s/p R TKA    PT Comments    Pt continues to progress slowly with mobility and with noted buckling at R knee with ambulation but no LOB - will attempt KI at next session to improve pt stability.  Follow Up Recommendations  Follow surgeon's recommendation for DC plan and follow-up therapies;Supervision/Assistance - 24 hour     Equipment Recommendations  Rolling walker with 5" wheels;3in1 (PT)    Recommendations for Other Services       Precautions / Restrictions Precautions Precautions: Knee;Fall Restrictions Weight Bearing Restrictions: No Other Position/Activity Restrictions: WBAT    Mobility  Bed Mobility Overal bed mobility: Needs Assistance Bed Mobility: Supine to Sit     Supine to sit: Supervision     General bed mobility comments: increased time, use of bedrails and belt  Transfers Overall transfer level: Needs assistance Equipment used: Rolling walker (2 wheeled) Transfers: Sit to/from Stand Sit to Stand: Min guard         General transfer comment: cues for LE management and use of UEs to self assist  Ambulation/Gait Ambulation/Gait assistance: Min assist Gait Distance (Feet): 51 Feet Assistive device: Rolling walker (2 wheeled) Gait Pattern/deviations: Step-to pattern;Decreased step length - right;Decreased step length - left;Shuffle;Trunk flexed;Decreased stride length     General Gait Details: cues for sequence, posture, position from RW and increased UE WB; pt with 2 episodes of knee buckling noted but with no LOB   Stairs             Wheelchair Mobility    Modified Rankin (Stroke Patients Only)       Balance Overall balance assessment: Needs assistance Sitting-balance support: No upper extremity supported;Feet supported Sitting balance-Leahy Scale: Good     Standing balance  support: Bilateral upper extremity supported Standing balance-Leahy Scale: Poor                              Cognition Arousal/Alertness: Awake/alert Behavior During Therapy: WFL for tasks assessed/performed Overall Cognitive Status: Within Functional Limits for tasks assessed                                        Exercises Total Joint Exercises Ankle Circles/Pumps: AROM;10 reps;Both Ankle Circles/Pumps Limitations: lethargic Quad Sets: AROM;Right;10 reps;Supine Heel Slides: AAROM;Right;15 reps;Supine Straight Leg Raises: AAROM;Right;15 reps;Supine Goniometric ROM: AAROM R knee -5 - 40    General Comments        Pertinent Vitals/Pain Pain Assessment: 0-10 Pain Score: 5  Pain Location: right knee Pain Descriptors / Indicators: Grimacing;Sore;Aching Pain Intervention(s): Limited activity within patient's tolerance;Monitored during session;Premedicated before session;Ice applied    Home Living                      Prior Function            PT Goals (current goals can now be found in the care plan section) Acute Rehab PT Goals Patient Stated Goal: Regain IND PT Goal Formulation: With patient Time For Goal Achievement: 04/26/19 Potential to Achieve Goals: Good Progress towards PT goals: Progressing toward goals    Frequency    7X/week      PT Plan Current plan remains appropriate    Co-evaluation  AM-PAC PT "6 Clicks" Mobility   Outcome Measure  Help needed turning from your back to your side while in a flat bed without using bedrails?: A Little Help needed moving from lying on your back to sitting on the side of a flat bed without using bedrails?: A Little Help needed moving to and from a bed to a chair (including a wheelchair)?: A Little Help needed standing up from a chair using your arms (e.g., wheelchair or bedside chair)?: A Little Help needed to walk in hospital room?: A Lot Help needed climbing  3-5 steps with a railing? : A Lot 6 Click Score: 16    End of Session Equipment Utilized During Treatment: Gait belt Activity Tolerance: Patient limited by fatigue;Patient limited by pain Patient left: in chair;with call bell/phone within reach;with chair alarm set Nurse Communication: Mobility status PT Visit Diagnosis: Difficulty in walking, not elsewhere classified (R26.2)     Time: 0973-5329 PT Time Calculation (min) (ACUTE ONLY): 36 min  Charges:  $Gait Training: 8-22 mins $Therapeutic Exercise: 8-22 mins                   [place] Pager [phone] Office [phone]    Hakim Minniefield 04/23/2019, 3:41 PM

## 2019-04-23 NOTE — Progress Notes (Signed)
Physical Therapy Treatment Patient Details Name: [name] MRN: [id number] DOB: 21-Jan-1946 Today's Date: 04/23/2019    History of Present Illness s/p R TKA    PT Comments    Pt up with R KI in place and with noted improved stability and confidence in movement.  Pt ambulated in hall and negotiated stairs in preparation for dc home this evening.  Written instruction and crutch provided for stairs.  Follow Up Recommendations  Follow surgeon's recommendation for DC plan and follow-up therapies;Supervision/Assistance - 24 hour     Equipment Recommendations  Rolling walker with 5" wheels;3in1 (PT)    Recommendations for Other Services       Precautions / Restrictions Precautions Precautions: Knee;Fall Restrictions Weight Bearing Restrictions: No Other Position/Activity Restrictions: WBAT    Mobility  Bed Mobility Overal bed mobility: Needs Assistance Bed Mobility: Supine to Sit     Supine to sit: Supervision     General bed mobility comments: Pt up in chair and requests back to same  Transfers Overall transfer level: Needs assistance Equipment used: Rolling walker (2 wheeled) Transfers: Sit to/from Stand Sit to Stand: Min guard;Supervision         General transfer comment: cues for LE management and use of UEs to self assist  Ambulation/Gait Ambulation/Gait assistance: Min assist;Min guard Gait Distance (Feet): 50 Feet Assistive device: Rolling walker (2 wheeled) Gait Pattern/deviations: Step-to pattern;Decreased step length - right;Decreased step length - left;Shuffle;Trunk flexed;Decreased stride length Gait velocity: decreased   General Gait Details: cues for sequence, posture, position from RW and increased UE WB; KI in place and pt notes increased confidence and improved stability   Stairs Stairs: Yes Stairs assistance: Min assist Stair Management: One rail Right;Step to pattern;Forwards;With crutches Number of Stairs: 4 General stair comments:  2 steps twice with rail and crutch; cues for sequence and foot/crutch placement; assist for balance   Wheelchair Mobility    Modified Rankin (Stroke Patients Only)       Balance Overall balance assessment: Needs assistance Sitting-balance support: No upper extremity supported;Feet supported Sitting balance-Leahy Scale: Good     Standing balance support: Bilateral upper extremity supported Standing balance-Leahy Scale: Poor                              Cognition Arousal/Alertness: Awake/alert Behavior During Therapy: WFL for tasks assessed/performed Overall Cognitive Status: Within Functional Limits for tasks assessed                                        Exercises Total Joint Exercises Ankle Circles/Pumps: AROM;10 reps;Both Ankle Circles/Pumps Limitations: lethargic Quad Sets: AROM;Right;10 reps;Supine Heel Slides: AAROM;Right;15 reps;Supine Straight Leg Raises: AAROM;Right;15 reps;Supine Goniometric ROM: AAROM R knee -5 - 40    General Comments        Pertinent Vitals/Pain Pain Assessment: 0-10 Pain Score: 5  Pain Location: right knee Pain Descriptors / Indicators: Grimacing;Sore;Aching Pain Intervention(s): Limited activity within patient's tolerance;Monitored during session;Premedicated before session    Home Living                      Prior Function            PT Goals (current goals can now be found in the care plan section) Acute Rehab PT Goals Patient Stated Goal: Regain IND PT Goal Formulation: With patient Time For  Goal Achievement: 04/26/19 Potential to Achieve Goals: Good Progress towards PT goals: Progressing toward goals    Frequency    7X/week      PT Plan Current plan remains appropriate    Co-evaluation              AM-PAC PT "6 Clicks" Mobility   Outcome Measure  Help needed turning from your back to your side while in a flat bed without using bedrails?: A Little Help needed  moving from lying on your back to sitting on the side of a flat bed without using bedrails?: A Little Help needed moving to and from a bed to a chair (including a wheelchair)?: A Little Help needed standing up from a chair using your arms (e.g., wheelchair or bedside chair)?: A Little Help needed to walk in hospital room?: A Little Help needed climbing 3-5 steps with a railing? : A Little 6 Click Score: 18    End of Session Equipment Utilized During Treatment: Gait belt;Right knee immobilizer Activity Tolerance: Patient tolerated treatment well;Patient limited by fatigue Patient left: in chair;with call bell/phone within reach;with chair alarm set Nurse Communication: Mobility status PT Visit Diagnosis: Difficulty in walking, not elsewhere classified (R26.2)     Time: 8882-8003 PT Time Calculation (min) (ACUTE ONLY): 30 min  Charges:  $Gait Training: 23-37 mins $Therapeutic Exercise: 8-22 mins                     Mauro Kaufmann PT Acute Rehabilitation Services Pager [phone] Office [phone]    [name] 04/23/2019, 3:47 PM

## 2019-04-24 DIAGNOSIS — E785 Hyperlipidemia, unspecified: Secondary | ICD-10-CM | POA: Diagnosis not present

## 2019-04-24 DIAGNOSIS — Z471 Aftercare following joint replacement surgery: Secondary | ICD-10-CM | POA: Diagnosis not present

## 2019-04-24 DIAGNOSIS — M940 Chondrocostal junction syndrome [Tietze]: Secondary | ICD-10-CM | POA: Diagnosis not present

## 2019-04-24 DIAGNOSIS — J309 Allergic rhinitis, unspecified: Secondary | ICD-10-CM | POA: Diagnosis not present

## 2019-04-24 DIAGNOSIS — Z96651 Presence of right artificial knee joint: Secondary | ICD-10-CM | POA: Diagnosis not present

## 2019-04-24 DIAGNOSIS — G4733 Obstructive sleep apnea (adult) (pediatric): Secondary | ICD-10-CM | POA: Diagnosis not present

## 2019-04-24 DIAGNOSIS — E669 Obesity, unspecified: Secondary | ICD-10-CM | POA: Diagnosis not present

## 2019-04-24 DIAGNOSIS — I493 Ventricular premature depolarization: Secondary | ICD-10-CM | POA: Diagnosis not present

## 2019-04-24 DIAGNOSIS — E119 Type 2 diabetes mellitus without complications: Secondary | ICD-10-CM | POA: Diagnosis not present

## 2019-04-24 DIAGNOSIS — I10 Essential (primary) hypertension: Secondary | ICD-10-CM | POA: Diagnosis not present

## 2019-04-27 DIAGNOSIS — Z96651 Presence of right artificial knee joint: Secondary | ICD-10-CM | POA: Diagnosis not present

## 2019-04-27 DIAGNOSIS — I10 Essential (primary) hypertension: Secondary | ICD-10-CM | POA: Diagnosis not present

## 2019-04-27 DIAGNOSIS — J309 Allergic rhinitis, unspecified: Secondary | ICD-10-CM | POA: Diagnosis not present

## 2019-04-27 DIAGNOSIS — Z471 Aftercare following joint replacement surgery: Secondary | ICD-10-CM | POA: Diagnosis not present

## 2019-04-27 DIAGNOSIS — E119 Type 2 diabetes mellitus without complications: Secondary | ICD-10-CM | POA: Diagnosis not present

## 2019-04-27 DIAGNOSIS — E785 Hyperlipidemia, unspecified: Secondary | ICD-10-CM | POA: Diagnosis not present

## 2019-04-27 DIAGNOSIS — G4733 Obstructive sleep apnea (adult) (pediatric): Secondary | ICD-10-CM | POA: Diagnosis not present

## 2019-04-27 DIAGNOSIS — I493 Ventricular premature depolarization: Secondary | ICD-10-CM | POA: Diagnosis not present

## 2019-04-27 DIAGNOSIS — M940 Chondrocostal junction syndrome [Tietze]: Secondary | ICD-10-CM | POA: Diagnosis not present

## 2019-04-27 DIAGNOSIS — E669 Obesity, unspecified: Secondary | ICD-10-CM | POA: Diagnosis not present

## 2019-04-28 DIAGNOSIS — I493 Ventricular premature depolarization: Secondary | ICD-10-CM | POA: Diagnosis not present

## 2019-04-28 DIAGNOSIS — Z96651 Presence of right artificial knee joint: Secondary | ICD-10-CM | POA: Diagnosis not present

## 2019-04-28 DIAGNOSIS — I10 Essential (primary) hypertension: Secondary | ICD-10-CM | POA: Diagnosis not present

## 2019-04-28 DIAGNOSIS — E119 Type 2 diabetes mellitus without complications: Secondary | ICD-10-CM | POA: Diagnosis not present

## 2019-04-28 DIAGNOSIS — E785 Hyperlipidemia, unspecified: Secondary | ICD-10-CM | POA: Diagnosis not present

## 2019-04-28 DIAGNOSIS — J309 Allergic rhinitis, unspecified: Secondary | ICD-10-CM | POA: Diagnosis not present

## 2019-04-28 DIAGNOSIS — M940 Chondrocostal junction syndrome [Tietze]: Secondary | ICD-10-CM | POA: Diagnosis not present

## 2019-04-28 DIAGNOSIS — G4733 Obstructive sleep apnea (adult) (pediatric): Secondary | ICD-10-CM | POA: Diagnosis not present

## 2019-04-28 DIAGNOSIS — Z471 Aftercare following joint replacement surgery: Secondary | ICD-10-CM | POA: Diagnosis not present

## 2019-04-28 DIAGNOSIS — E669 Obesity, unspecified: Secondary | ICD-10-CM | POA: Diagnosis not present

## 2019-04-30 DIAGNOSIS — Z96651 Presence of right artificial knee joint: Secondary | ICD-10-CM | POA: Diagnosis not present

## 2019-04-30 DIAGNOSIS — E785 Hyperlipidemia, unspecified: Secondary | ICD-10-CM | POA: Diagnosis not present

## 2019-04-30 DIAGNOSIS — I10 Essential (primary) hypertension: Secondary | ICD-10-CM | POA: Diagnosis not present

## 2019-04-30 DIAGNOSIS — E119 Type 2 diabetes mellitus without complications: Secondary | ICD-10-CM | POA: Diagnosis not present

## 2019-04-30 DIAGNOSIS — G4733 Obstructive sleep apnea (adult) (pediatric): Secondary | ICD-10-CM | POA: Diagnosis not present

## 2019-04-30 DIAGNOSIS — I493 Ventricular premature depolarization: Secondary | ICD-10-CM | POA: Diagnosis not present

## 2019-04-30 DIAGNOSIS — J309 Allergic rhinitis, unspecified: Secondary | ICD-10-CM | POA: Diagnosis not present

## 2019-04-30 DIAGNOSIS — E669 Obesity, unspecified: Secondary | ICD-10-CM | POA: Diagnosis not present

## 2019-04-30 DIAGNOSIS — Z471 Aftercare following joint replacement surgery: Secondary | ICD-10-CM | POA: Diagnosis not present

## 2019-04-30 DIAGNOSIS — M940 Chondrocostal junction syndrome [Tietze]: Secondary | ICD-10-CM | POA: Diagnosis not present

## 2019-05-04 DIAGNOSIS — M6281 Muscle weakness (generalized): Secondary | ICD-10-CM | POA: Diagnosis not present

## 2019-05-04 DIAGNOSIS — M1711 Unilateral primary osteoarthritis, right knee: Secondary | ICD-10-CM | POA: Diagnosis not present

## 2019-05-04 DIAGNOSIS — Z96651 Presence of right artificial knee joint: Secondary | ICD-10-CM | POA: Diagnosis not present

## 2019-05-04 DIAGNOSIS — M25661 Stiffness of right knee, not elsewhere classified: Secondary | ICD-10-CM | POA: Diagnosis not present

## 2019-05-05 DIAGNOSIS — G4733 Obstructive sleep apnea (adult) (pediatric): Secondary | ICD-10-CM | POA: Diagnosis not present

## 2019-05-05 DIAGNOSIS — I10 Essential (primary) hypertension: Secondary | ICD-10-CM | POA: Diagnosis not present

## 2019-05-05 DIAGNOSIS — R0602 Shortness of breath: Secondary | ICD-10-CM | POA: Diagnosis not present

## 2019-05-05 DIAGNOSIS — R0600 Dyspnea, unspecified: Secondary | ICD-10-CM | POA: Diagnosis not present

## 2019-05-06 DIAGNOSIS — M6281 Muscle weakness (generalized): Secondary | ICD-10-CM | POA: Diagnosis not present

## 2019-05-06 DIAGNOSIS — M25661 Stiffness of right knee, not elsewhere classified: Secondary | ICD-10-CM | POA: Diagnosis not present

## 2019-05-12 DIAGNOSIS — M25661 Stiffness of right knee, not elsewhere classified: Secondary | ICD-10-CM | POA: Diagnosis not present

## 2019-05-12 DIAGNOSIS — M6281 Muscle weakness (generalized): Secondary | ICD-10-CM | POA: Diagnosis not present

## 2019-05-14 ENCOUNTER — Telehealth: Payer: Self-pay

## 2019-05-14 DIAGNOSIS — M25661 Stiffness of right knee, not elsewhere classified: Secondary | ICD-10-CM | POA: Diagnosis not present

## 2019-05-14 DIAGNOSIS — M6281 Muscle weakness (generalized): Secondary | ICD-10-CM | POA: Diagnosis not present

## 2019-05-14 NOTE — Telephone Encounter (Signed)
The pt was notified that Dr. Baird Cancer wanted to remind the pt to take the Meloxicam as needed, that long term use can increase the pt's risk for a cardiovascular events.

## 2019-05-18 DIAGNOSIS — M6281 Muscle weakness (generalized): Secondary | ICD-10-CM | POA: Diagnosis not present

## 2019-05-18 DIAGNOSIS — M25661 Stiffness of right knee, not elsewhere classified: Secondary | ICD-10-CM | POA: Diagnosis not present

## 2019-05-24 DIAGNOSIS — M25661 Stiffness of right knee, not elsewhere classified: Secondary | ICD-10-CM | POA: Diagnosis not present

## 2019-05-24 DIAGNOSIS — M6281 Muscle weakness (generalized): Secondary | ICD-10-CM | POA: Diagnosis not present

## 2019-05-26 DIAGNOSIS — M25661 Stiffness of right knee, not elsewhere classified: Secondary | ICD-10-CM | POA: Diagnosis not present

## 2019-05-26 DIAGNOSIS — M6281 Muscle weakness (generalized): Secondary | ICD-10-CM | POA: Diagnosis not present

## 2019-05-31 DIAGNOSIS — M6281 Muscle weakness (generalized): Secondary | ICD-10-CM | POA: Diagnosis not present

## 2019-05-31 DIAGNOSIS — M25661 Stiffness of right knee, not elsewhere classified: Secondary | ICD-10-CM | POA: Diagnosis not present

## 2019-06-01 DIAGNOSIS — M7061 Trochanteric bursitis, right hip: Secondary | ICD-10-CM | POA: Diagnosis not present

## 2019-06-04 DIAGNOSIS — G4733 Obstructive sleep apnea (adult) (pediatric): Secondary | ICD-10-CM | POA: Diagnosis not present

## 2019-06-04 DIAGNOSIS — R0602 Shortness of breath: Secondary | ICD-10-CM | POA: Diagnosis not present

## 2019-06-04 DIAGNOSIS — I10 Essential (primary) hypertension: Secondary | ICD-10-CM | POA: Diagnosis not present

## 2019-06-04 DIAGNOSIS — R0600 Dyspnea, unspecified: Secondary | ICD-10-CM | POA: Diagnosis not present

## 2019-06-08 DIAGNOSIS — M6281 Muscle weakness (generalized): Secondary | ICD-10-CM | POA: Diagnosis not present

## 2019-06-08 DIAGNOSIS — M25661 Stiffness of right knee, not elsewhere classified: Secondary | ICD-10-CM | POA: Diagnosis not present

## 2019-06-09 ENCOUNTER — Encounter: Payer: Medicare HMO | Admitting: Internal Medicine

## 2019-06-09 ENCOUNTER — Encounter: Payer: Self-pay | Admitting: Internal Medicine

## 2019-06-09 ENCOUNTER — Ambulatory Visit: Payer: Medicare HMO | Admitting: Internal Medicine

## 2019-06-09 ENCOUNTER — Ambulatory Visit (INDEPENDENT_AMBULATORY_CARE_PROVIDER_SITE_OTHER): Payer: Medicare HMO | Admitting: Internal Medicine

## 2019-06-09 ENCOUNTER — Ambulatory Visit (INDEPENDENT_AMBULATORY_CARE_PROVIDER_SITE_OTHER): Payer: Medicare HMO

## 2019-06-09 ENCOUNTER — Other Ambulatory Visit: Payer: Self-pay

## 2019-06-09 VITALS — BP 110/66 | HR 63 | Temp 98.0°F | Ht 62.8 in | Wt 212.2 lb

## 2019-06-09 DIAGNOSIS — I10 Essential (primary) hypertension: Secondary | ICD-10-CM | POA: Diagnosis not present

## 2019-06-09 DIAGNOSIS — E1165 Type 2 diabetes mellitus with hyperglycemia: Secondary | ICD-10-CM | POA: Diagnosis not present

## 2019-06-09 DIAGNOSIS — Z6837 Body mass index (BMI) 37.0-37.9, adult: Secondary | ICD-10-CM

## 2019-06-09 DIAGNOSIS — Z0000 Encounter for general adult medical examination without abnormal findings: Secondary | ICD-10-CM

## 2019-06-09 DIAGNOSIS — Z23 Encounter for immunization: Secondary | ICD-10-CM | POA: Diagnosis not present

## 2019-06-09 DIAGNOSIS — R8290 Unspecified abnormal findings in urine: Secondary | ICD-10-CM | POA: Diagnosis not present

## 2019-06-09 LAB — POCT UA - MICROALBUMIN
Albumin/Creatinine Ratio, Urine, POC: <30
Creatinine, POC: 200 mg/dL
Microalbumin Ur, POC: 10 mg/L

## 2019-06-09 LAB — POCT URINALYSIS DIPSTICK
Bilirubin, UA: NEGATIVE
Glucose, UA: NEGATIVE
Ketones, UA: NEGATIVE
Nitrite, UA: NEGATIVE
Protein, UA: NEGATIVE
Spec Grav, UA: 1.02 (ref 1.010–1.025)
Urobilinogen, UA: 0.2 E.U./dL
pH, UA: 5.5 (ref 5.0–8.0)

## 2019-06-09 MED ORDER — PNEUMOCOCCAL 13-VAL CONJ VACC IM SUSP: 0.5000 mL | INTRAMUSCULAR | 0 refills | Status: AC

## 2019-06-09 MED ORDER — NYSTATIN 100000 UNIT/GM EX POWD: 1.0000 "application " | Freq: Two times a day (BID) | CUTANEOUS | 1 refills | Status: DC

## 2019-06-09 NOTE — Patient Instructions (Signed)
[name] , Thank you for taking time to come for your Medicare Wellness Visit. I appreciate your ongoing commitment to your health goals. Please review the following plan we discussed and let me know if I can assist you in the future.   Screening recommendations/referrals: Colonoscopy: 04/2015 Mammogram: 11/2018 Bone Density: 09/2015 Recommended yearly ophthalmology/optometry visit for glaucoma screening and checkup Recommended yearly dental visit for hygiene and checkup  Vaccinations: Influenza vaccine: declines Pneumococcal vaccine: declines Tdap vaccine: declines Shingles vaccine: declines    Advanced directives: Please bring a copy of your POA (Power of Attorney) and/or Living Will to your next appointment.    Conditions/risks identified: obesity  Next appointment: 08/10/2019 at 10:15   Preventive Care 65 Years and Older, Female Preventive care refers to lifestyle choices and visits with your health care provider that can promote health and wellness. What does preventive care include?  A yearly physical exam. This is also called an annual well check.  Dental exams once or twice a year.  Routine eye exams. Ask your health care provider how often you should have your eyes checked.  Personal lifestyle choices, including:  Daily care of your teeth and gums.  Regular physical activity.  Eating a healthy diet.  Avoiding tobacco and drug use.  Limiting alcohol use.  Practicing safe sex.  Taking low-dose aspirin every day.  Taking vitamin and mineral supplements as recommended by your health care provider. What happens during an annual well check? The services and screenings done by your health care provider during your annual well check will depend on your age, overall health, lifestyle risk factors, and family history of disease. Counseling  Your health care provider may ask you questions about your:  Alcohol use.  Tobacco use.  Drug use.  Emotional  well-being.  Home and relationship well-being.  Sexual activity.  Eating habits.  History of falls.  Memory and ability to understand (cognition).  Work and work Statistician.  Reproductive health. Screening  You may have the following tests or measurements:  Height, weight, and BMI.  Blood pressure.  Lipid and cholesterol levels. These may be checked every 5 years, or more frequently if you are over 64 years old.  Skin check.  Lung cancer screening. You may have this screening every year starting at age 38 if you have a 30-pack-year history of smoking and currently smoke or have quit within the past 15 years.  Fecal occult blood test (FOBT) of the stool. You may have this test every year starting at age 47.  Flexible sigmoidoscopy or colonoscopy. You may have a sigmoidoscopy every 5 years or a colonoscopy every 10 years starting at age 32.  Hepatitis C blood test.  Hepatitis B blood test.  Sexually transmitted disease (STD) testing.  Diabetes screening. This is done by checking your blood sugar (glucose) after you have not eaten for a while (fasting). You may have this done every 1-3 years.  Bone density scan. This is done to screen for osteoporosis. You may have this done starting at age 38.  Mammogram. This may be done every 1-2 years. Talk to your health care provider about how often you should have regular mammograms. Talk with your health care provider about your test results, treatment options, and if necessary, the need for more tests. Vaccines  Your health care provider may recommend certain vaccines, such as:  Influenza vaccine. This is recommended every year.  Tetanus, diphtheria, and acellular pertussis (Tdap, Td) vaccine. You may need a Td booster  every 10 years.  Zoster vaccine. You may need this after age 80.  Pneumococcal 13-valent conjugate (PCV13) vaccine. One dose is recommended after age 88.  Pneumococcal polysaccharide (PPSV23) vaccine. One  dose is recommended after age 62. Talk to your health care provider about which screenings and vaccines you need and how often you need them. This information is not intended to replace advice given to you by your health care provider. Make sure you discuss any questions you have with your health care provider. Document Released: 07/07/2015 Document Revised: 02/28/2016 Document Reviewed: 04/11/2015 Elsevier Interactive Patient Education  2017 [place] Prevention in the Home Falls can cause injuries. They can happen to people of all ages. There are many things you can do to make your home safe and to help prevent falls. What can I do on the outside of my home?  Regularly fix the edges of walkways and driveways and fix any cracks.  Remove anything that might make you trip as you walk through a door, such as a raised step or threshold.  Trim any bushes or trees on the path to your home.  Use bright outdoor lighting.  Clear any walking paths of anything that might make someone trip, such as rocks or tools.  Regularly check to see if handrails are loose or broken. Make sure that both sides of any steps have handrails.  Any raised decks and porches should have guardrails on the edges.  Have any leaves, snow, or ice cleared regularly.  Use sand or salt on walking paths during winter.  Clean up any spills in your garage right away. This includes oil or grease spills. What can I do in the bathroom?  Use night lights.  Install grab bars by the toilet and in the tub and shower. Do not use towel bars as grab bars.  Use non-skid mats or decals in the tub or shower.  If you need to sit down in the shower, use a plastic, non-slip stool.  Keep the floor dry. Clean up any water that spills on the floor as soon as it happens.  Remove soap buildup in the tub or shower regularly.  Attach bath mats securely with double-sided non-slip rug tape.  Do not have throw rugs and other  things on the floor that can make you trip. What can I do in the bedroom?  Use night lights.  Make sure that you have a light by your bed that is easy to reach.  Do not use any sheets or blankets that are too big for your bed. They should not hang down onto the floor.  Have a firm chair that has side arms. You can use this for support while you get dressed.  Do not have throw rugs and other things on the floor that can make you trip. What can I do in the kitchen?  Clean up any spills right away.  Avoid walking on wet floors.  Keep items that you use a lot in easy-to-reach places.  If you need to reach something above you, use a strong step stool that has a grab bar.  Keep electrical cords out of the way.  Do not use floor polish or wax that makes floors slippery. If you must use wax, use non-skid floor wax.  Do not have throw rugs and other things on the floor that can make you trip. What can I do with my stairs?  Do not leave any items on the stairs.  Make  sure that there are handrails on both sides of the stairs and use them. Fix handrails that are broken or loose. Make sure that handrails are as long as the stairways.  Check any carpeting to make sure that it is firmly attached to the stairs. Fix any carpet that is loose or worn.  Avoid having throw rugs at the top or bottom of the stairs. If you do have throw rugs, attach them to the floor with carpet tape.  Make sure that you have a light switch at the top of the stairs and the bottom of the stairs. If you do not have them, ask someone to add them for you. What else can I do to help prevent falls?  Wear shoes that:  Do not have high heels.  Have rubber bottoms.  Are comfortable and fit you well.  Are closed at the toe. Do not wear sandals.  If you use a stepladder:  Make sure that it is fully opened. Do not climb a closed stepladder.  Make sure that both sides of the stepladder are locked into place.  Ask  someone to hold it for you, if possible.  Clearly mark and make sure that you can see:  Any grab bars or handrails.  First and last steps.  Where the edge of each step is.  Use tools that help you move around (mobility aids) if they are needed. These include:  Canes.  Walkers.  Scooters.  Crutches.  Turn on the lights when you go into a dark area. Replace any light bulbs as soon as they burn out.  Set up your furniture so you have a clear path. Avoid moving your furniture around.  If any of your floors are uneven, fix them.  If there are any pets around you, be aware of where they are.  Review your medicines with your doctor. Some medicines can make you feel dizzy. This can increase your chance of falling. Ask your doctor what other things that you can do to help prevent falls. This information is not intended to replace advice given to you by your health care provider. Make sure you discuss any questions you have with your health care provider. Document Released: 04/06/2009 Document Revised: 11/16/2015 Document Reviewed: 07/15/2014 Elsevier Interactive Patient Education  2017 [employer].

## 2019-06-09 NOTE — Progress Notes (Signed)
This visit occurred during the SARS-CoV-2 public health emergency.  Safety protocols were in place, including screening questions prior to the visit, additional usage of staff PPE, and extensive cleaning of exam room while observing appropriate contact time as indicated for disinfecting solutions.  Subjective:   [name] is a 73 y.o. female who presents for Medicare Annual (Subsequent) preventive examination.  Review of Systems:  n/a Cardiac Risk Factors include: advanced age (>57men, >74 women);diabetes mellitus;hypertension;sedentary lifestyle;obesity (BMI >30kg/m2)     Objective:     Vitals: BP 110/66 (BP Location: Left Arm, Patient Position: Sitting)   Pulse 63   Temp 98 F (36.7 C) (Oral)   Ht 5' 2.8" (1.595 m)   Wt 212 lb 3.2 oz (96.3 kg)   BMI 37.83 kg/m   Body mass index is 37.83 kg/m.  Advanced Directives 06/09/2019 04/20/2019 04/19/2019 04/19/2019 04/13/2019 06/04/2018 08/01/2017  Does Patient Have a Medical Advance Directive? Yes No No No No Yes Yes  Type of Estate agent of [place];Living will - - - - Midwife;Living will -  Does patient want to make changes to medical advance directive? - - - - - No - Patient declined -  Copy of Healthcare Power of Attorney in Chart? No - copy requested - - - - No - copy requested -  Would patient like information on creating a medical advance directive? - No - Patient declined No - Patient declined No - Patient declined No - Patient declined - -    Tobacco Social History   Tobacco Use  Smoking Status Never Smoker  Smokeless Tobacco Never Used     Counseling given: Not Answered   Clinical Intake:  Pre-visit preparation completed: Yes  Pain : 0-10 Pain Score: 7  Pain Type: Chronic pain Pain Location: Knee Pain Orientation: Left Pain Radiating Towards: none Pain Descriptors / Indicators: Discomfort Pain Onset: More than a month ago Pain Relieving Factors: wrap helps  some  Pain Relieving Factors: wrap helps some  Nutritional Status: BMI > 30  Obese Nutritional Risks: None Diabetes: Yes CBG done?: No Did pt. bring in CBG monitor from home?: No  How often do you need to have someone help you when you read instructions, pamphlets, or other written materials from your doctor or pharmacy?: 1 - Never What is the last grade level you completed in school?: year college  Interpreter Needed?: No  Information entered by :: NAllen LPN  Past Medical History:  Diagnosis Date  . Bladder infection   . Carpal tunnel syndrome   . Complication of anesthesia    Difficult to arouse  . Diabetes mellitus   . Hypertension   . Kidney stone   . Kidney stones   . Sleep apnea    Past Surgical History:  Procedure Laterality Date  . ABDOMINAL HYSTERECTOMY     partial  . APPENDECTOMY    . CARPAL TUNNEL RELEASE Bilateral 1980  . HERNIA REPAIR    . TONSILLECTOMY    . TOTAL KNEE ARTHROPLASTY Right 04/19/2019   Procedure: RIGHT TOTAL KNEE ARTHROPLASTY;  Surgeon: Gean Birchwood, MD;  Location: [facility];  Service: Orthopedics;  Laterality: Right;   Family History  Problem Relation Age of Onset  . Heart attack Father 46  . Stroke Father 78  . Diabetes Father   . Diabetes Brother   . Diabetes Brother   . Breast cancer Sister 39  . Diabetes Mother   . Hypertension Mother    Social History  Socioeconomic History  . Marital status: Widowed    Spouse name: Not on file  . Number of children: 2  . Years of education: Not on file  . Highest education level: Not on file  Occupational History  . Occupation: retired  Tobacco Use  . Smoking status: Never Smoker  . Smokeless tobacco: Never Used  Substance and Sexual Activity  . Alcohol use: No  . Drug use: No  . Sexual activity: Not Currently  Other Topics Concern  . Not on file  Social History Narrative  . Not on file   Social Determinants of Health   Financial Resource Strain: Low Risk   . Difficulty of  Paying Living Expenses: Not hard at all  Food Insecurity: No Food Insecurity  . Worried About Programme researcher, broadcasting/film/videounning Out of Food in the Last Year: Never true  . Ran Out of Food in the Last Year: Never true  Transportation Needs: No Transportation Needs  . Lack of Transportation (Medical): No  . Lack of Transportation (Non-Medical): No  Physical Activity: Insufficiently Active  . Days of Exercise per Week: 2 days  . Minutes of Exercise per Session: 60 min  Stress: No Stress Concern Present  . Feeling of Stress : Only a little  Social Connections:   . Frequency of Communication with Friends and Family: Not on file  . Frequency of Social Gatherings with Friends and Family: Not on file  . Attends Religious Services: Not on file  . Active Member of Clubs or Organizations: Not on file  . Attends BankerClub or Organization Meetings: Not on file  . Marital Status: Not on file    Outpatient Encounter Medications as of 06/09/2019  Medication Sig  . aspirin EC 81 MG tablet Take 1 tablet (81 mg total) by mouth 2 (two) times daily.  . cetirizine (ZYRTEC) 10 MG tablet Take 10 mg by mouth daily as needed for allergies.   . Cholecalciferol (VITAMIN D3) 125 MCG (5000 UT) CAPS Take 1 capsule by mouth daily at 12 noon.   . Coenzyme Q10 (COQ10) 100 MG CAPS Take 100 mg by mouth daily with lunch.   . famotidine (PEPCID AC MAXIMUM STRENGTH) 20 MG tablet Take 20 mg by mouth 2 (two) times daily.  . fluticasone (FLONASE) 50 MCG/ACT nasal spray Place 1 spray into both nostrils daily. (Patient taking differently: Place 1 spray into both nostrils daily as needed for allergies. )  . furosemide (LASIX) 20 MG tablet Take 20 mg by mouth daily as needed (fluid).   . hydrochlorothiazide (MICROZIDE) 12.5 MG capsule Take 12.5 mg by mouth daily as needed (excess fluid/swelling).   Marland Kitchen. losartan (COZAAR) 25 MG tablet Take 25 mg by mouth 2 (two) times a week. Take one tablet (25 mg) by mouth twice weekly - Tuesdays and Thursdays  . metFORMIN  (GLUCOPHAGE) 500 MG tablet Take 500 mg by mouth 2 (two) times daily with a meal.  . oxyCODONE-acetaminophen (PERCOCET/ROXICET) 5-325 MG tablet Take 1 tablet by mouth every 4 (four) hours as needed for severe pain.  . potassium chloride SA (K-DUR) 20 MEQ tablet Take 1 tablet (20 mEq total) by mouth daily as needed (with each dose of hydrochlorothiazide). (Patient taking differently: Take 20 mEq by mouth every morning. )  . pravastatin (PRAVACHOL) 80 MG tablet One tab po qweekly on Mondays (Patient taking differently: Take 80 mg by mouth every Monday. One tab po qweekly on Mondays)  . tiZANidine (ZANAFLEX) 2 MG tablet Take 1 tablet (2 mg total) by mouth  every 6 (six) hours as needed.  . Vitamin D, Ergocalciferol, (DRISDOL) 1.25 MG (50000 UT) CAPS capsule TAKE 1 CAPSULE BY MOUTH ON TUESDAYS AND FRIDAYS (Patient not taking: Reported on 04/07/2019)   No facility-administered encounter medications on file as of 06/09/2019.    Activities of Daily Living In your present state of health, do you have any difficulty performing the following activities: 06/09/2019 04/20/2019  Hearing? N -  Vision? N -  Difficulty concentrating or making decisions? N -  Walking or climbing stairs? Y -  Comment with knee -  Dressing or bathing? N -  Doing errands, shopping? N N  Preparing Food and eating ? N -  Using the Toilet? N -  In the past six months, have you accidently leaked urine? Y -  Comment takes fluid pills -  Do you have problems with loss of bowel control? N -  Managing your Medications? N -  Managing your Finances? N -  Housekeeping or managing your Housekeeping? N -  Some recent data might be hidden    Patient Care Team: Glendale Chard, MD as PCP - General (Internal Medicine)    Assessment:   This is a routine wellness examination for [place].  Exercise Activities and Dietary recommendations Current Exercise Habits: Home exercise routine, Time (Minutes): 60, Frequency (Times/Week): 2, Weekly  Exercise (Minutes/Week): 120  Goals    . DIET - INCREASE WATER INTAKE (pt-stated)    . Exercise 150 min/wk Moderate Activity (pt-stated)    . Patient Stated     06/09/2019, wants to get knee replacement done       Fall Risk Fall Risk  06/09/2019 09/02/2018 07/02/2018 06/04/2018 06/04/2018  Falls in the past year? 0 0 0 0 0  Risk for fall due to : Medication side effect - - Medication side effect -  Follow up Falls evaluation completed;Education provided;Falls prevention discussed - - - -   Is the patient's home free of loose throw rugs in walkways, pet beds, electrical cords, etc?   yes      Grab bars in the bathroom? no      Handrails on the stairs?   yes      Adequate lighting?   yes  Timed Get Up and Go performed: n/a  Depression Screen PHQ 2/9 Scores 06/09/2019 09/02/2018 07/02/2018 06/04/2018  PHQ - 2 Score 0 0 0 0  PHQ- 9 Score 3 - - -     Cognitive Function     6CIT Screen 06/09/2019 06/04/2018  What Year? 0 points 0 points  What month? 0 points 0 points  What time? 0 points 3 points  Count back from 20 0 points 0 points  Months in reverse 2 points 2 points  Repeat phrase 4 points 2 points  Total Score 6 7     There is no immunization history on file for this patient.  Qualifies for Shingles Vaccine? yes  Screening Tests Health Maintenance  Topic Date Due  . FOOT EXAM  06/05/2019  . INFLUENZA VACCINE  09/22/2019 (Originally 01/23/2019)  . TETANUS/TDAP  06/08/2020 (Originally 11/16/1964)  . PNA vac Low Risk Adult (1 of 2 - PCV13) 06/08/2020 (Originally 11/15/2009)  . HEMOGLOBIN A1C  10/18/2019  . OPHTHALMOLOGY EXAM  02/18/2020  . MAMMOGRAM  12/17/2020  . COLONOSCOPY  05/14/2025  . DEXA SCAN  Completed  . Hepatitis C Screening  Completed    Cancer Screenings: Lung: Low Dose CT Chest recommended if Age 107-80 years, 30 pack-year currently smoking OR  have quit w/in 15years. Patient does not qualify. Breast:  Up to date on Mammogram? Yes   Up to date of Bone  Density/Dexa? Yes Colorectal: up to date  Additional Screenings: : Hepatitis C Screening: 09/30/2012     Plan:    Patient wants to get second knee replacement done.   I have personally reviewed and noted the following in the patient's chart:   . Medical and social history . Use of alcohol, tobacco or illicit drugs  . Current medications and supplements . Functional ability and status . Nutritional status . Physical activity . Advanced directives . List of other physicians . Hospitalizations, surgeries, and ER visits in previous 12 months . Vitals . Screenings to include cognitive, depression, and falls . Referrals and appointments  In addition, I have reviewed and discussed with patient certain preventive protocols, quality metrics, and best practice recommendations. A written personalized care plan for preventive services as well as general preventive health recommendations were provided to patient.     Barb Merino, LPN  [phone]

## 2019-06-09 NOTE — Patient Instructions (Signed)
Health Maintenance, Female Adopting a healthy lifestyle and getting preventive care are important in promoting health and wellness. Ask your health care provider about:  The right schedule for you to have regular tests and exams.  Things you can do on your own to prevent diseases and keep yourself healthy. What should I know about diet, weight, and exercise? Eat a healthy diet   Eat a diet that includes plenty of vegetables, fruits, low-fat dairy products, and lean protein.  Do not eat a lot of foods that are high in solid fats, added sugars, or sodium. Maintain a healthy weight Body mass index (BMI) is used to identify weight problems. It estimates body fat based on height and weight. Your health care provider can help determine your BMI and help you achieve or maintain a healthy weight. Get regular exercise Get regular exercise. This is one of the most important things you can do for your health. Most adults should:  Exercise for at least 150 minutes each week. The exercise should increase your heart rate and make you sweat (moderate-intensity exercise).  Do strengthening exercises at least twice a week. This is in addition to the moderate-intensity exercise.  Spend less time sitting. Even light physical activity can be beneficial. Watch cholesterol and blood lipids Have your blood tested for lipids and cholesterol at 74 years of age, then have this test every 5 years. Have your cholesterol levels checked more often if:  Your lipid or cholesterol levels are high.  You are older than 74 years of age.  You are at high risk for heart disease. What should I know about cancer screening? Depending on your health history and family history, you may need to have cancer screening at various ages. This may include screening for:  Breast cancer.  Cervical cancer.  Colorectal cancer.  Skin cancer.  Lung cancer. What should I know about heart disease, diabetes, and high blood  pressure? Blood pressure and heart disease  High blood pressure causes heart disease and increases the risk of stroke. This is more likely to develop in people who have high blood pressure readings, are of African descent, or are overweight.  Have your blood pressure checked: ? Every 3-5 years if you are 18-39 years of age. ? Every year if you are 40 years old or older. Diabetes Have regular diabetes screenings. This checks your fasting blood sugar level. Have the screening done:  Once every three years after age 40 if you are at a normal weight and have a low risk for diabetes.  More often and at a younger age if you are overweight or have a high risk for diabetes. What should I know about preventing infection? Hepatitis B If you have a higher risk for hepatitis B, you should be screened for this virus. Talk with your health care provider to find out if you are at risk for hepatitis B infection. Hepatitis C Testing is recommended for:  Everyone born from 1945 through 1965.  Anyone with known risk factors for hepatitis C. Sexually transmitted infections (STIs)  Get screened for STIs, including gonorrhea and chlamydia, if: ? You are sexually active and are younger than 74 years of age. ? You are older than 74 years of age and your health care provider tells you that you are at risk for this type of infection. ? Your sexual activity has changed since you were last screened, and you are at increased risk for chlamydia or gonorrhea. Ask your health care provider if   you are at risk.  Ask your health care provider about whether you are at high risk for HIV. Your health care provider may recommend a prescription medicine to help prevent HIV infection. If you choose to take medicine to prevent HIV, you should first get tested for HIV. You should then be tested every 3 months for as long as you are taking the medicine. Pregnancy  If you are about to stop having your period (premenopausal) and  you may become pregnant, seek counseling before you get pregnant.  Take 400 to 800 micrograms (mcg) of folic acid every day if you become pregnant.  Ask for birth control (contraception) if you want to prevent pregnancy. Osteoporosis and menopause Osteoporosis is a disease in which the bones lose minerals and strength with aging. This can result in bone fractures. If you are 65 years old or older, or if you are at risk for osteoporosis and fractures, ask your health care provider if you should:  Be screened for bone loss.  Take a calcium or vitamin D supplement to lower your risk of fractures.  Be given hormone replacement therapy (HRT) to treat symptoms of menopause. Follow these instructions at home: Lifestyle  Do not use any products that contain nicotine or tobacco, such as cigarettes, e-cigarettes, and chewing tobacco. If you need help quitting, ask your health care provider.  Do not use street drugs.  Do not share needles.  Ask your health care provider for help if you need support or information about quitting drugs. Alcohol use  Do not drink alcohol if: ? Your health care provider tells you not to drink. ? You are pregnant, may be pregnant, or are planning to become pregnant.  If you drink alcohol: ? Limit how much you use to 0-1 drink a day. ? Limit intake if you are breastfeeding.  Be aware of how much alcohol is in your drink. In the [place] one drink equals one 12 oz bottle of beer (355 mL), one 5 oz glass of wine (148 mL), or one 1 oz glass of hard liquor (44 mL). General instructions  Schedule regular health, dental, and eye exams.  Stay current with your vaccines.  Tell your health care provider if: ? You often feel depressed. ? You have ever been abused or do not feel safe at home. Summary  Adopting a healthy lifestyle and getting preventive care are important in promoting health and wellness.  Follow your health care provider's instructions about healthy  diet, exercising, and getting tested or screened for diseases.  Follow your health care provider's instructions on monitoring your cholesterol and blood pressure. This information is not intended to replace advice given to you by your health care provider. Make sure you discuss any questions you have with your health care provider. Document Released: 12/24/2010 Document Revised: 06/03/2018 Document Reviewed: 06/03/2018 Elsevier Patient Education  2020 [employer]  

## 2019-06-10 DIAGNOSIS — R8290 Unspecified abnormal findings in urine: Secondary | ICD-10-CM | POA: Diagnosis not present

## 2019-06-10 DIAGNOSIS — Z01411 Encounter for gynecological examination (general) (routine) with abnormal findings: Secondary | ICD-10-CM | POA: Diagnosis not present

## 2019-06-10 DIAGNOSIS — N760 Acute vaginitis: Secondary | ICD-10-CM | POA: Diagnosis not present

## 2019-06-12 LAB — URINE CULTURE

## 2019-06-14 ENCOUNTER — Encounter: Payer: Self-pay | Admitting: Internal Medicine

## 2019-06-15 ENCOUNTER — Other Ambulatory Visit: Payer: Self-pay | Admitting: Internal Medicine

## 2019-06-15 DIAGNOSIS — M6281 Muscle weakness (generalized): Secondary | ICD-10-CM | POA: Diagnosis not present

## 2019-06-15 DIAGNOSIS — M25661 Stiffness of right knee, not elsewhere classified: Secondary | ICD-10-CM | POA: Diagnosis not present

## 2019-06-15 MED ORDER — NITROFURANTOIN MONOHYD MACRO 100 MG PO CAPS: 100.0000 mg | ORAL_CAPSULE | Freq: Two times a day (BID) | ORAL | 0 refills | Status: AC

## 2019-06-17 DIAGNOSIS — M6281 Muscle weakness (generalized): Secondary | ICD-10-CM | POA: Diagnosis not present

## 2019-06-17 DIAGNOSIS — M25661 Stiffness of right knee, not elsewhere classified: Secondary | ICD-10-CM | POA: Diagnosis not present

## 2019-06-19 NOTE — Progress Notes (Signed)
This visit occurred during the SARS-CoV-2 public health emergency.  Safety protocols were in place, including screening questions prior to the visit, additional usage of staff PPE, and extensive cleaning of exam room while observing appropriate contact time as indicated for disinfecting solutions.  Subjective:     Patient ID: [name] , female    DOB: 1946/03/31 , 73 y.o.   MRN: [id number]   Chief Complaint  Patient presents with  . Annual Exam  . Hypertension  . Diabetes    HPI  She is here today for a full physical examination. She is followed by GYN for her pelvic exams. She has no specific concerns or complaints at this time.   Hypertension This is a chronic problem. The current episode started more than 1 year ago. The problem has been gradually improving since onset. The problem is controlled. Pertinent negatives include no blurred vision, chest pain, palpitations or shortness of breath. Risk factors for coronary artery disease include diabetes mellitus, dyslipidemia, obesity, post-menopausal state and sedentary lifestyle. The current treatment provides moderate improvement. Compliance problems include exercise.   Diabetes She presents for her follow-up diabetic visit. She has type 2 diabetes mellitus. Her disease course has been stable. There are no hypoglycemic associated symptoms. Tremors:  Pertinent negatives for diabetes include no blurred vision and no chest pain. There are no hypoglycemic complications. Risk factors for coronary artery disease include dyslipidemia, diabetes mellitus, hypertension, obesity, post-menopausal and sedentary lifestyle. She is following a diabetic diet. She participates in exercise intermittently. Her breakfast blood glucose is taken between 8-9 am. Her breakfast blood glucose range is generally 110-130 mg/dl. An ACE inhibitor/angiotensin II receptor blocker is being taken. Eye exam is current.     Past Medical History:  Diagnosis Date  .  Bladder infection   . Carpal tunnel syndrome   . Complication of anesthesia    Difficult to arouse  . Diabetes mellitus   . Hypertension   . Kidney stone   . Kidney stones   . Sleep apnea      Family History  Problem Relation Age of Onset  . Heart attack Father 52  . Stroke Father 32  . Diabetes Father   . Diabetes Brother   . Diabetes Brother   . Breast cancer Sister 88  . Diabetes Mother   . Hypertension Mother      Current Outpatient Medications:  .  aspirin EC 81 MG tablet, Take 1 tablet (81 mg total) by mouth 2 (two) times daily., Disp: 60 tablet, Rfl: 0 .  cetirizine (ZYRTEC) 10 MG tablet, Take 10 mg by mouth daily as needed for allergies. , Disp: , Rfl:  .  Cholecalciferol (VITAMIN D3) 125 MCG (5000 UT) CAPS, Take 1 capsule by mouth daily at 12 noon. , Disp: , Rfl:  .  Coenzyme Q10 (COQ10) 100 MG CAPS, Take 100 mg by mouth daily with lunch. , Disp: , Rfl:  .  famotidine (PEPCID AC MAXIMUM STRENGTH) 20 MG tablet, Take 20 mg by mouth 2 (two) times daily., Disp: , Rfl:  .  fluticasone (FLONASE) 50 MCG/ACT nasal spray, Place 1 spray into both nostrils daily. (Patient taking differently: Place 1 spray into both nostrils daily as needed for allergies. ), Disp: 16 g, Rfl: 2 .  furosemide (LASIX) 20 MG tablet, Take 20 mg by mouth daily as needed (fluid). , Disp: , Rfl:  .  hydrochlorothiazide (MICROZIDE) 12.5 MG capsule, Take 12.5 mg by mouth daily as needed (excess fluid/swelling). ,  Disp: , Rfl:  .  losartan (COZAAR) 25 MG tablet, Take 25 mg by mouth 2 (two) times a week. Take one tablet (25 mg) by mouth twice weekly - Tuesdays and Thursdays, Disp: , Rfl:  .  metFORMIN (GLUCOPHAGE) 500 MG tablet, Take 500 mg by mouth 2 (two) times daily with a meal., Disp: , Rfl:  .  oxyCODONE-acetaminophen (PERCOCET/ROXICET) 5-325 MG tablet, Take 1 tablet by mouth every 4 (four) hours as needed for severe pain., Disp: 30 tablet, Rfl: 0 .  potassium chloride SA (K-DUR) 20 MEQ tablet, Take 1  tablet (20 mEq total) by mouth daily as needed (with each dose of hydrochlorothiazide). (Patient taking differently: Take 20 mEq by mouth every morning. ), Disp: 90 tablet, Rfl: 2 .  pravastatin (PRAVACHOL) 80 MG tablet, One tab po qweekly on Mondays (Patient taking differently: Take 80 mg by mouth every Monday. One tab po qweekly on Mondays), Disp: 12 tablet, Rfl: 2 .  tiZANidine (ZANAFLEX) 2 MG tablet, Take 1 tablet (2 mg total) by mouth every 6 (six) hours as needed., Disp: 60 tablet, Rfl: 0 .  nitrofurantoin, macrocrystal-monohydrate, (MACROBID) 100 MG capsule, Take 1 capsule (100 mg total) by mouth 2 (two) times daily for 7 days., Disp: 14 capsule, Rfl: 0 .  nystatin (MYCOSTATIN/NYSTOP) powder, Apply 1 application topically 2 (two) times daily. To affected area(s), Disp: 60 g, Rfl: 1 .  Vitamin D, Ergocalciferol, (DRISDOL) 1.25 MG (50000 UT) CAPS capsule, TAKE 1 CAPSULE BY MOUTH ON TUESDAYS AND FRIDAYS (Patient not taking: Reported on 04/07/2019), Disp: 24 capsule, Rfl: 0   Allergies  Allergen Reactions  . Sulfa Antibiotics Itching  . Sulfonamide Derivatives Itching  . Tetanus Toxoids Swelling    Arm swelled at injection site     The patient states she uses post menopausal status for birth control. Last LMP was No LMP recorded. Patient has had a hysterectomy.. Negative for Dysmenorrhea Negative for: breast discharge, breast lump(s), breast pain and breast self exam. Associated symptoms include abnormal vaginal bleeding. Pertinent negatives include abnormal bleeding (hematology), anxiety, decreased libido, depression, difficulty falling sleep, dyspareunia, history of infertility, nocturia, sexual dysfunction, sleep disturbances, urinary incontinence, urinary urgency, vaginal discharge and vaginal itching. Diet regular.The patient states her exercise level is  intermittent.   . The patient's tobacco use is:  Social History   Tobacco Use  Smoking Status Never Smoker  Smokeless Tobacco Never  Used  . She has been exposed to passive smoke. The patient's alcohol use is:  Social History   Substance and Sexual Activity  Alcohol Use No    Review of Systems  Constitutional: Negative.   HENT: Negative.   Eyes: Negative.  Negative for blurred vision.  Respiratory: Negative.  Negative for shortness of breath.   Cardiovascular: Negative.  Negative for chest pain and palpitations.  Endocrine: Negative.   Genitourinary: Negative.   Musculoskeletal: Negative.   Skin: Negative.   Allergic/Immunologic: Negative.   Neurological: Negative.  Tremors:   Hematological: Negative.   Psychiatric/Behavioral: Negative.      Today's Vitals   06/09/19 1109  BP: 110/66  Pulse: 63  Temp: 98 F (36.7 C)  TempSrc: Oral  Weight: 212 lb 3.2 oz (96.3 kg)  Height: 5' 2.8" (1.595 m)  PainSc: 7   PainLoc: Knee   Body mass index is 37.83 kg/m.   Objective:  Physical Exam Vitals and nursing note reviewed.  Constitutional:      Appearance: Normal appearance. She is obese.  HENT:  Head: Normocephalic and atraumatic.     Right Ear: Tympanic membrane, ear canal and external ear normal.     Left Ear: Tympanic membrane, ear canal and external ear normal.     Nose:     Comments: Deferred, masked    Mouth/Throat:     Comments: Deferred, masked Eyes:     Extraocular Movements: Extraocular movements intact.     Conjunctiva/sclera: Conjunctivae normal.     Pupils: Pupils are equal, round, and reactive to light.  Cardiovascular:     Rate and Rhythm: Normal rate and regular rhythm.     Pulses: Normal pulses.          Dorsalis pedis pulses are 2+ on the right side and 2+ on the left side.     Heart sounds: Normal heart sounds.  Pulmonary:     Effort: Pulmonary effort is normal.     Breath sounds: Normal breath sounds.  Chest:     Breasts: Tanner Score is 5.        Right: Normal.        Left: Normal.  Abdominal:     General: Bowel sounds are normal.     Palpations: Abdomen is soft.      Comments: Obese, difficult to assess organomegaly.   Genitourinary:    Comments: deferred Musculoskeletal:        General: Normal range of motion.     Cervical back: Normal range of motion and neck supple.  Feet:     Right foot:     Protective Sensation: 5 sites tested. 5 sites sensed.     Skin integrity: Callus and dry skin present.     Toenail Condition: Right toenails are normal.     Left foot:     Protective Sensation: 5 sites tested. 5 sites sensed.     Skin integrity: Callus and dry skin present.     Toenail Condition: Left toenails are normal.  Skin:    General: Skin is warm and dry.  Neurological:     General: No focal deficit present.     Mental Status: She is alert and oriented to person, place, and time.  Psychiatric:        Mood and Affect: Mood normal.        Behavior: Behavior normal.         Assessment And Plan:     1. Routine general medical examination at health care facility  A full exam was performed.  Importance of monthly self breast exams was discussed with the patient. PATIENT HAS BEEN ADVISED TO GET 30-45 MINUTES REGULAR EXERCISE NO LESS THAN FOUR TO FIVE DAYS PER WEEK - BOTH WEIGHTBEARING EXERCISES AND AEROBIC ARE RECOMMENDED.  SHE IS ADVISED TO FOLLOW A HEALTHY DIET WITH AT LEAST SIX FRUITS/VEGGIES PER DAY, DECREASE INTAKE OF RED MEAT, AND TO INCREASE FISH INTAKE TO TWO DAYS PER WEEK.  MEATS/FISH SHOULD NOT BE FRIED, BAKED OR BROILED IS PREFERABLE.  I SUGGEST WEARING SPF 50 SUNSCREEN ON EXPOSED PARTS AND ESPECIALLY WHEN IN THE DIRECT SUNLIGHT FOR AN EXTENDED PERIOD OF TIME.  PLEASE AVOID FAST FOOD RESTAURANTS AND INCREASE YOUR WATER INTAKE.   2. Essential hypertension, benign  Chronic, well controlled. She will continue with current meds. She is encouraged to avoid adding salt to her foods. EKG performed, no new changes noted. She is encouraged to incorporate more exercise into her daily routine.   - EKG 12-Lead  3. Uncontrolled type 2 diabetes  mellitus with hyperglycemia (HCC)  Diabetic  foot exam was performed. I DISCUSSED WITH THE PATIENT AT LENGTH REGARDING THE GOALS OF GLYCEMIC CONTROL AND POSSIBLE LONG-TERM COMPLICATIONS.  I  ALSO STRESSED THE IMPORTANCE OF COMPLIANCE WITH HOME GLUCOSE MONITORING, DIETARY RESTRICTIONS INCLUDING AVOIDANCE OF SUGARY DRINKS/PROCESSED FOODS,  ALONG WITH REGULAR EXERCISE.  I  ALSO STRESSED THE IMPORTANCE OF ANNUAL EYE EXAMS, SELF FOOT CARE AND COMPLIANCE WITH OFFICE VISITS. - POCT Urinalysis Dipstick (81002) - POCT UA - Microalbumin  4. Abnormal urine  Urinalysis performed. I will send urine off for culture.   - Culture, Urine  5. Immunization due   She was given rx Prevnar-13. She is encouraged to notify me when this is administered at the pharmacy.    6. Class 2 severe obesity due to excess calories with serious comorbidity and body mass index (BMI) of 37.0 to 37.9 in adult [facility](HCC)  She is encouraged to strive for BMI less than 32 to decrease cardiac risk. Importance of regular exercise was discussed with the patient. She is encouraged to aim for 30 minutes five days per week.    Gwynneth Alimentobyn N Tiyana Galla, MD    THE PATIENT IS ENCOURAGED TO PRACTICE SOCIAL DISTANCING DUE TO THE COVID-19 PANDEMIC.

## 2019-06-22 DIAGNOSIS — M6281 Muscle weakness (generalized): Secondary | ICD-10-CM | POA: Diagnosis not present

## 2019-06-22 DIAGNOSIS — M25661 Stiffness of right knee, not elsewhere classified: Secondary | ICD-10-CM | POA: Diagnosis not present

## 2019-06-24 DIAGNOSIS — M25661 Stiffness of right knee, not elsewhere classified: Secondary | ICD-10-CM | POA: Diagnosis not present

## 2019-06-24 DIAGNOSIS — M6281 Muscle weakness (generalized): Secondary | ICD-10-CM | POA: Diagnosis not present

## 2019-06-27 ENCOUNTER — Encounter: Payer: Self-pay | Admitting: Internal Medicine

## 2019-06-28 ENCOUNTER — Telehealth: Payer: Self-pay

## 2019-06-28 NOTE — Telephone Encounter (Signed)
The pt said that she got the message from MyChart that Dr. Allyne Gee agrees with the pt's gyn.  The pt said that she was prescribed Estradiol cream from her GYN to use for 2 weeks and that she is itching in her vaginal area.  The pt said that the cream isn't helping.  The pt was told to let her GYN that the cream isn't helping with her itch.

## 2019-06-29 DIAGNOSIS — M6281 Muscle weakness (generalized): Secondary | ICD-10-CM | POA: Diagnosis not present

## 2019-06-29 DIAGNOSIS — M25661 Stiffness of right knee, not elsewhere classified: Secondary | ICD-10-CM | POA: Diagnosis not present

## 2019-07-01 DIAGNOSIS — M6281 Muscle weakness (generalized): Secondary | ICD-10-CM | POA: Diagnosis not present

## 2019-07-01 DIAGNOSIS — M25661 Stiffness of right knee, not elsewhere classified: Secondary | ICD-10-CM | POA: Diagnosis not present

## 2019-07-05 DIAGNOSIS — G4733 Obstructive sleep apnea (adult) (pediatric): Secondary | ICD-10-CM | POA: Diagnosis not present

## 2019-07-05 DIAGNOSIS — R0600 Dyspnea, unspecified: Secondary | ICD-10-CM | POA: Diagnosis not present

## 2019-07-05 DIAGNOSIS — R0602 Shortness of breath: Secondary | ICD-10-CM | POA: Diagnosis not present

## 2019-07-05 DIAGNOSIS — I10 Essential (primary) hypertension: Secondary | ICD-10-CM | POA: Diagnosis not present

## 2019-07-21 DIAGNOSIS — Z20822 Contact with and (suspected) exposure to covid-19: Secondary | ICD-10-CM | POA: Diagnosis not present

## 2019-08-05 DIAGNOSIS — R0602 Shortness of breath: Secondary | ICD-10-CM | POA: Diagnosis not present

## 2019-08-05 DIAGNOSIS — I10 Essential (primary) hypertension: Secondary | ICD-10-CM | POA: Diagnosis not present

## 2019-08-05 DIAGNOSIS — R0600 Dyspnea, unspecified: Secondary | ICD-10-CM | POA: Diagnosis not present

## 2019-08-05 DIAGNOSIS — G4733 Obstructive sleep apnea (adult) (pediatric): Secondary | ICD-10-CM | POA: Diagnosis not present

## 2019-08-06 DIAGNOSIS — N3289 Other specified disorders of bladder: Secondary | ICD-10-CM | POA: Diagnosis not present

## 2019-08-06 DIAGNOSIS — M545 Low back pain: Secondary | ICD-10-CM | POA: Diagnosis not present

## 2019-08-06 DIAGNOSIS — N760 Acute vaginitis: Secondary | ICD-10-CM | POA: Diagnosis not present

## 2019-08-10 ENCOUNTER — Other Ambulatory Visit: Payer: Self-pay

## 2019-08-10 ENCOUNTER — Encounter: Payer: Self-pay | Admitting: Internal Medicine

## 2019-08-10 ENCOUNTER — Ambulatory Visit (INDEPENDENT_AMBULATORY_CARE_PROVIDER_SITE_OTHER): Payer: Medicare HMO | Admitting: Internal Medicine

## 2019-08-10 VITALS — BP 126/80 | HR 56 | Temp 98.4°F | Ht 62.8 in | Wt 215.8 lb

## 2019-08-10 DIAGNOSIS — Z6838 Body mass index (BMI) 38.0-38.9, adult: Secondary | ICD-10-CM

## 2019-08-10 DIAGNOSIS — E1165 Type 2 diabetes mellitus with hyperglycemia: Secondary | ICD-10-CM | POA: Diagnosis not present

## 2019-08-10 DIAGNOSIS — I10 Essential (primary) hypertension: Secondary | ICD-10-CM | POA: Diagnosis not present

## 2019-08-10 DIAGNOSIS — Z887 Allergy status to serum and vaccine status: Secondary | ICD-10-CM | POA: Diagnosis not present

## 2019-08-10 NOTE — Progress Notes (Signed)
This visit occurred during the SARS-CoV-2 public health emergency.  Safety protocols were in place, including screening questions prior to the visit, additional usage of staff PPE, and extensive cleaning of exam room while observing appropriate contact time as indicated for disinfecting solutions.  Subjective:     Patient ID: [name] , female    DOB: 04-Oct-1945 , 74 y.o.   MRN: [id number]   Chief Complaint  Patient presents with  . Diabetes  . Hypertension    HPI  She is here today for diabetes/high blood pressure check.   Diabetes She presents for her follow-up diabetic visit. She has type 2 diabetes mellitus. Her disease course has been stable. There are no hypoglycemic associated symptoms. Pertinent negatives for diabetes include no blurred vision and no chest pain. There are no hypoglycemic complications. Risk factors for coronary artery disease include dyslipidemia, diabetes mellitus, hypertension, obesity, post-menopausal and sedentary lifestyle. Her breakfast blood glucose is taken between 8-9 am. Her breakfast blood glucose range is generally 90-110 mg/dl. An ACE inhibitor/angiotensin II receptor blocker is being taken. Eye exam is current.  Hypertension This is a chronic problem. The current episode started more than 1 year ago. The problem has been gradually improving since onset. The problem is controlled. Pertinent negatives include no blurred vision, chest pain, palpitations or shortness of breath.     Past Medical History:  Diagnosis Date  . Bladder infection   . Carpal tunnel syndrome   . Complication of anesthesia    Difficult to arouse  . Diabetes mellitus   . Hypertension   . Kidney stone   . Kidney stones   . Sleep apnea      Family History  Problem Relation Age of Onset  . Heart attack Father 29  . Stroke Father 107  . Diabetes Father   . Diabetes Brother   . Diabetes Brother   . Breast cancer Sister 54  . Diabetes Mother   . Hypertension  Mother      Current Outpatient Medications:  .  aspirin EC 81 MG tablet, Take 1 tablet (81 mg total) by mouth 2 (two) times daily., Disp: 60 tablet, Rfl: 0 .  cetirizine (ZYRTEC) 10 MG tablet, Take 10 mg by mouth daily as needed for allergies. , Disp: , Rfl:  .  Cholecalciferol (VITAMIN D3) 50 MCG (2000 UT) capsule, Take 1 capsule by mouth daily at 12 noon. , Disp: , Rfl:  .  Coenzyme Q10 (COQ10) 100 MG CAPS, Take 100 mg by mouth daily with lunch. , Disp: , Rfl:  .  famotidine (PEPCID AC MAXIMUM STRENGTH) 20 MG tablet, Take 20 mg by mouth 2 (two) times daily., Disp: , Rfl:  .  fluticasone (FLONASE) 50 MCG/ACT nasal spray, Place 1 spray into both nostrils daily. (Patient taking differently: Place 1 spray into both nostrils daily as needed for allergies. ), Disp: 16 g, Rfl: 2 .  furosemide (LASIX) 20 MG tablet, Take 20 mg by mouth daily as needed (fluid). , Disp: , Rfl:  .  hydrochlorothiazide (MICROZIDE) 12.5 MG capsule, Take 12.5 mg by mouth daily as needed (excess fluid/swelling). , Disp: , Rfl:  .  losartan (COZAAR) 25 MG tablet, Take 25 mg by mouth 2 (two) times a week. Take one tablet (25 mg) by mouth twice weekly - Tuesdays and Thursdays, Disp: , Rfl:  .  metFORMIN (GLUCOPHAGE) 500 MG tablet, Take 500 mg by mouth 2 (two) times daily with a meal., Disp: , Rfl:  .  nystatin (  MYCOSTATIN/NYSTOP) powder, Apply 1 application topically 2 (two) times daily. To affected area(s), Disp: 60 g, Rfl: 1 .  potassium chloride SA (K-DUR) 20 MEQ tablet, Take 1 tablet (20 mEq total) by mouth daily as needed (with each dose of hydrochlorothiazide). (Patient taking differently: Take 20 mEq by mouth every morning. ), Disp: 90 tablet, Rfl: 2 .  pravastatin (PRAVACHOL) 80 MG tablet, One tab po qweekly on Mondays (Patient taking differently: Take 80 mg by mouth every Monday. One tab po qweekly on Mondays), Disp: 12 tablet, Rfl: 2   Allergies  Allergen Reactions  . Haemophilus Influenzae Vaccines     Nausea, pain,  body weakness  . Sulfa Antibiotics Itching  . Sulfonamide Derivatives Itching  . Tetanus Toxoids Swelling    Arm swelled at injection site     Review of Systems  Constitutional: Negative.   Eyes: Negative for blurred vision.  Respiratory: Negative.  Negative for shortness of breath.   Cardiovascular: Negative.  Negative for chest pain and palpitations.  Gastrointestinal: Negative.   Neurological: Negative.   Psychiatric/Behavioral: Negative.      Today's Vitals   08/10/19 1010  BP: 126/80  Pulse: (!) 56  Temp: 98.4 F (36.9 C)  TempSrc: Oral  Weight: 215 lb 12.8 oz (97.9 kg)  Height: 5' 2.8" (1.595 m)  PainSc: 3   PainLoc: Back   Body mass index is 38.47 kg/m.   Objective:  Physical Exam Vitals and nursing note reviewed.  Constitutional:      Appearance: Normal appearance. She is obese.  HENT:     Head: Normocephalic and atraumatic.  Cardiovascular:     Rate and Rhythm: Normal rate and regular rhythm.     Heart sounds: Normal heart sounds.  Pulmonary:     Effort: Pulmonary effort is normal.     Breath sounds: Normal breath sounds.  Skin:    General: Skin is warm.  Neurological:     General: No focal deficit present.     Mental Status: She is alert.  Psychiatric:        Mood and Affect: Mood normal.        Behavior: Behavior normal.         Assessment And Plan:     1. Uncontrolled type 2 diabetes mellitus with hyperglycemia ([place])  I will check labs as listed below.  Importance of regular exercise was discussed with the patient. She is to aim for 30 minutes five days per week. I willl make medication changes as needed.    - CMP14+EGFR - Lipid panel - Hemoglobin A1c  2. Essential hypertension, benign  Chronic, well controlled. She will continue with current meds. She is encouraged to avoid adding salt to her foods.   3. Class 2 severe obesity due to excess calories with serious comorbidity in adult, BMI 38 ([place])  She is encouraged to strive for  BMI less than 30 to decrease cardiac risk.  Again, she is encouraged to exercise 30 minutes five days per week (minimally). She reports she is unable to walk in her neighborhood, so she is advised to walk her driveway and around her home when weather permits.   4. History of influenza vaccine allergy  Pt advised that her reaction to the flu vaccine is actually a SIDE EFFECT and not an allergy. she experienced nausea, body pain and weakness after the flu vaccine. She does not feel comfortable getting COVID vaccine.  I suggest she see allergist prior to getting COVID vaccine. She also notes  allergy to tetanus vaccine (but this is also a side effect, arm swelling).  She does not wish to pursue vaccine at this time. Pt advised that it is important to be vaccinated, especially if an allergist deems it safe for her to move forward.    [name], MD    THE PATIENT IS ENCOURAGED TO PRACTICE SOCIAL DISTANCING DUE TO THE COVID-19 PANDEMIC.

## 2019-08-10 NOTE — Patient Instructions (Signed)
Oscillococcinum   Exercising to Lose Weight Exercise is structured, repetitive physical activity to improve fitness and health. Getting regular exercise is important for everyone. It is especially important if you are overweight. Being overweight increases your risk of heart disease, stroke, diabetes, high blood pressure, and several types of cancer. Reducing your calorie intake and exercising can help you lose weight. Exercise is usually categorized as moderate or vigorous intensity. To lose weight, most people need to do a certain amount of moderate-intensity or vigorous-intensity exercise each week. Moderate-intensity exercise  Moderate-intensity exercise is any activity that gets you moving enough to burn at least three times more energy (calories) than if you were sitting. Examples of moderate exercise include:  Walking a mile in 15 minutes.  Doing light yard work.  Biking at an easy pace. Most people should get at least 150 minutes (2 hours and 30 minutes) a week of moderate-intensity exercise to maintain their body weight. Vigorous-intensity exercise Vigorous-intensity exercise is any activity that gets you moving enough to burn at least six times more calories than if you were sitting. When you exercise at this intensity, you should be working hard enough that you are not able to carry on a conversation. Examples of vigorous exercise include:  Running.  Playing a team sport, such as football, basketball, and soccer.  Jumping rope. Most people should get at least 75 minutes (1 hour and 15 minutes) a week of vigorous-intensity exercise to maintain their body weight. How can exercise affect me? When you exercise enough to burn more calories than you eat, you lose weight. Exercise also reduces body fat and builds muscle. The more muscle you have, the more calories you burn. Exercise also:  Improves mood.  Reduces stress and tension.  Improves your overall fitness, flexibility,  and endurance.  Increases bone strength. The amount of exercise you need to lose weight depends on:  Your age.  The type of exercise.  Any health conditions you have.  Your overall physical ability. Talk to your health care provider about how much exercise you need and what types of activities are safe for you. What actions can I take to lose weight? Nutrition   Make changes to your diet as told by your health care provider or diet and nutrition specialist (dietitian). This may include: ? Eating fewer calories. ? Eating more protein. ? Eating less unhealthy fats. ? Eating a diet that includes fresh fruits and vegetables, whole grains, low-fat dairy products, and lean protein. ? Avoiding foods with added fat, salt, and sugar.  Drink plenty of water while you exercise to prevent dehydration or heat stroke. Activity  Choose an activity that you enjoy and set realistic goals. Your health care provider can help you make an exercise plan that works for you.  Exercise at a moderate or vigorous intensity most days of the week. ? The intensity of exercise may vary from person to person. You can tell how intense a workout is for you by paying attention to your breathing and heartbeat. Most people will notice their breathing and heartbeat get faster with more intense exercise.  Do resistance training twice each week, such as: ? Push-ups. ? Sit-ups. ? Lifting weights. ? Using resistance bands.  Getting short amounts of exercise can be just as helpful as long structured periods of exercise. If you have trouble finding time to exercise, try to include exercise in your daily routine. ? Get up, stretch, and walk around every 30 minutes throughout the day. ?  Go for a walk during your lunch break. ? Park your car farther away from your destination. ? If you take public transportation, get off one stop early and walk the rest of the way. ? Make phone calls while standing up and walking  around. ? Take the stairs instead of elevators or escalators.  Wear comfortable clothes and shoes with good support.  Do not exercise so much that you hurt yourself, feel dizzy, or get very short of breath. Where to find more information  U.S. Department of Health and Human Services: ThisPath.fi  Centers for Disease Control and Prevention (CDC): FootballExhibition.com.br Contact a health care provider:  Before starting a new exercise program.  If you have questions or concerns about your weight.  If you have a medical problem that keeps you from exercising. Get help right away if you have any of the following while exercising:  Injury.  Dizziness.  Difficulty breathing or shortness of breath that does not go away when you stop exercising.  Chest pain.  Rapid heartbeat. Summary  Being overweight increases your risk of heart disease, stroke, diabetes, high blood pressure, and several types of cancer.  Losing weight happens when you burn more calories than you eat.  Reducing the amount of calories you eat in addition to getting regular moderate or vigorous exercise each week helps you lose weight. This information is not intended to replace advice given to you by your health care provider. Make sure you discuss any questions you have with your health care provider. Document Revised: 06/23/2017 Document Reviewed: 06/23/2017 Elsevier Patient Education  2020 [employer].

## 2019-08-11 LAB — HEMOGLOBIN A1C
Est. average glucose Bld gHb Est-mCnc: 128 mg/dL
Hgb A1c MFr Bld: 6.1 % — ABNORMAL HIGH (ref 4.8–5.6)

## 2019-08-11 LAB — LIPID PANEL
Chol/HDL Ratio: 2.7 ratio (ref 0.0–4.4)
Cholesterol, Total: 205 mg/dL — ABNORMAL HIGH (ref 100–199)
HDL: 75 mg/dL (ref >39)
LDL Chol Calc (NIH): 117 mg/dL — ABNORMAL HIGH (ref 0–99)
Triglycerides: 70 mg/dL (ref 0–149)
VLDL Cholesterol Cal: 13 mg/dL (ref 5–40)

## 2019-08-11 LAB — CMP14+EGFR
ALT: 7 IU/L (ref 0–32)
AST: 12 IU/L (ref 0–40)
Albumin/Globulin Ratio: 1.4 (ref 1.2–2.2)
Albumin: 3.9 g/dL (ref 3.7–4.7)
Alkaline Phosphatase: 103 IU/L (ref 39–117)
BUN/Creatinine Ratio: 17 (ref 12–28)
BUN: 12 mg/dL (ref 8–27)
Bilirubin Total: 0.3 mg/dL (ref 0.0–1.2)
CO2: 24 mmol/L (ref 20–29)
Calcium: 9.3 mg/dL (ref 8.7–10.3)
Chloride: 107 mmol/L — ABNORMAL HIGH (ref 96–106)
Creatinine, Ser: 0.72 mg/dL (ref 0.57–1.00)
GFR calc Af Amer: 95 mL/min/{1.73_m2} (ref >59)
GFR calc non Af Amer: 83 mL/min/{1.73_m2} (ref >59)
Globulin, Total: 2.7 g/dL (ref 1.5–4.5)
Glucose: 103 mg/dL — ABNORMAL HIGH (ref 65–99)
Potassium: 4.2 mmol/L (ref 3.5–5.2)
Sodium: 145 mmol/L — ABNORMAL HIGH (ref 134–144)
Total Protein: 6.6 g/dL (ref 6.0–8.5)

## 2019-08-12 ENCOUNTER — Telehealth: Payer: Self-pay

## 2019-08-12 NOTE — Telephone Encounter (Signed)
-----   Message from Dorothyann Peng, MD sent at 08/12/2019  9:05 AM EST ----- Here are your lab results:  Your kidney function is normal. However, your sodium level is slightly elevated- be sure to increase your water intake. This is a sign of slight dehydration.   Your LDL, bad cholesterol is 117 - as someone with diabetes, it should be less than 70. Are you willing to try cholesterol medication twice weekly?    Your a1c is 6.1, this is stable.   Please let me know if you have any questions or concerns. Stay safe!   Sincerely,    Robyn N. Allyne Gee, MD

## 2019-08-23 ENCOUNTER — Encounter: Payer: Self-pay | Admitting: Internal Medicine

## 2019-08-25 ENCOUNTER — Other Ambulatory Visit: Payer: Self-pay

## 2019-08-25 MED ORDER — PRAVASTATIN SODIUM 80 MG PO TABS: ORAL_TABLET | ORAL | 2 refills | Status: DC

## 2019-08-26 ENCOUNTER — Other Ambulatory Visit: Payer: Self-pay | Admitting: Internal Medicine

## 2019-09-01 ENCOUNTER — Telehealth: Payer: Self-pay | Admitting: *Deleted

## 2019-09-01 NOTE — Telephone Encounter (Signed)
Pt called with concerns about receiving her COVID vaccine because she is on medication for UTI; she has approximately 4 days left; explained to pt that vaccine is for COVID virus, and UTI is treated with antibiotics;  pt informed that she should not receive the vaccine at this time if she is having COVID symptoms, or recent vaccination such as shingles; pt encouraged to discuss concerns with her PCP, and she may call back to reschedule vaccine scheduled 09/02/19; pt also encouraged to visit CDC web site; she verbalized understanding.

## 2019-09-02 ENCOUNTER — Ambulatory Visit: Payer: Medicare HMO | Attending: Internal Medicine

## 2019-09-02 DIAGNOSIS — G4733 Obstructive sleep apnea (adult) (pediatric): Secondary | ICD-10-CM | POA: Diagnosis not present

## 2019-09-02 DIAGNOSIS — Z23 Encounter for immunization: Secondary | ICD-10-CM

## 2019-09-02 DIAGNOSIS — R0600 Dyspnea, unspecified: Secondary | ICD-10-CM | POA: Diagnosis not present

## 2019-09-02 DIAGNOSIS — R0602 Shortness of breath: Secondary | ICD-10-CM | POA: Diagnosis not present

## 2019-09-02 DIAGNOSIS — I10 Essential (primary) hypertension: Secondary | ICD-10-CM | POA: Diagnosis not present

## 2019-09-02 NOTE — Progress Notes (Addendum)
   Covid-19 Vaccination Clinic  Name:  [name]    MRN: [id number] DOB: 1945/10/26  09/02/2019  [name] was observed post Covid-19 immunization for 60 minutes without incident. She was provided with Vaccine Information Sheet and instruction to access the V-Safe system.   [name] was instructed to call 911 with any severe reactions post vaccine: Marland Kitchen Difficulty breathing  . Swelling of face and throat  . A fast heartbeat  . A bad rash all over body  . Dizziness and weakness   Immunizations Administered    Name Date Dose VIS Date Route   Pfizer COVID-19 Vaccine 09/02/2019  8:37 AM 0.3 mL 06/04/2019 Intramuscular   Manufacturer: [employer], [employer]   Lot: [id number]   NDC: [address]-5      Patient received first dose of Pfizer vaccine on 09/02/2019 at 08:37 am. Patient began feeling swelling of the tongue around 08:45 am. Benadryl 12.5 mg given. VS within normal limits. Patient swelling decreased post benadryl. Post vital signs within normal limits. Patient was instructed to call 911 with any severe reaction. Patient verbalized understand.    08:47-Pre VS- temp 97.8, BP 141/84, HR 55 and   100% on room air  10:05a- Post VS temp 98.1, BP 132/79, HR 56 and 100% on room air.

## 2019-09-27 ENCOUNTER — Ambulatory Visit: Payer: Medicare HMO | Attending: Internal Medicine

## 2019-09-27 DIAGNOSIS — Z23 Encounter for immunization: Secondary | ICD-10-CM

## 2019-09-27 NOTE — Progress Notes (Signed)
   Covid-19 Vaccination Clinic  Name:  [name]    MRN: [id number] DOB: 25-Sep-1945  09/27/2019  [name] was observed post Covid-19 immunization for 30 minutes based on pre-vaccination screening without incident. She was provided with Vaccine Information Sheet and instruction to access the V-Safe system.   [name] was instructed to call 911 with any severe reactions post vaccine: Marland Kitchen Difficulty breathing  . Swelling of face and throat  . A fast heartbeat  . A bad rash all over body  . Dizziness and weakness   Immunizations Administered    Name Date Dose VIS Date Route   Pfizer COVID-19 Vaccine 09/27/2019 10:15 AM 0.3 mL 06/04/2019 Intramuscular   Manufacturer: [employer], [employer]   Lot: [id number]   NDC: [address]-5

## 2019-10-03 DIAGNOSIS — R0602 Shortness of breath: Secondary | ICD-10-CM | POA: Diagnosis not present

## 2019-10-03 DIAGNOSIS — R0600 Dyspnea, unspecified: Secondary | ICD-10-CM | POA: Diagnosis not present

## 2019-10-03 DIAGNOSIS — G4733 Obstructive sleep apnea (adult) (pediatric): Secondary | ICD-10-CM | POA: Diagnosis not present

## 2019-10-03 DIAGNOSIS — I10 Essential (primary) hypertension: Secondary | ICD-10-CM | POA: Diagnosis not present

## 2019-10-07 ENCOUNTER — Encounter: Payer: Self-pay | Admitting: Internal Medicine

## 2019-10-07 ENCOUNTER — Other Ambulatory Visit: Payer: Self-pay

## 2019-10-07 ENCOUNTER — Ambulatory Visit (INDEPENDENT_AMBULATORY_CARE_PROVIDER_SITE_OTHER): Payer: Medicare HMO | Admitting: Internal Medicine

## 2019-10-07 VITALS — BP 140/80 | HR 60 | Temp 97.6°F | Ht 62.8 in | Wt 218.6 lb

## 2019-10-07 DIAGNOSIS — R300 Dysuria: Secondary | ICD-10-CM | POA: Diagnosis not present

## 2019-10-07 DIAGNOSIS — L308 Other specified dermatitis: Secondary | ICD-10-CM

## 2019-10-07 DIAGNOSIS — N390 Urinary tract infection, site not specified: Secondary | ICD-10-CM

## 2019-10-07 DIAGNOSIS — Z882 Allergy status to sulfonamides status: Secondary | ICD-10-CM

## 2019-10-07 LAB — POCT URINALYSIS DIPSTICK
Bilirubin, UA: NEGATIVE
Glucose, UA: NEGATIVE
Ketones, UA: NEGATIVE
Nitrite, UA: NEGATIVE
Protein, UA: POSITIVE — AB
Spec Grav, UA: 1.02 (ref 1.010–1.025)
Urobilinogen, UA: 0.2 E.U./dL
pH, UA: 5 (ref 5.0–8.0)

## 2019-10-07 MED ORDER — PHENAZOPYRIDINE HCL 100 MG PO TABS: 100.0000 mg | ORAL_TABLET | Freq: Three times a day (TID) | ORAL | 0 refills | Status: AC | PRN

## 2019-10-07 MED ORDER — NITROFURANTOIN MONOHYD MACRO 100 MG PO CAPS: 100.0000 mg | ORAL_CAPSULE | Freq: Two times a day (BID) | ORAL | 0 refills | Status: AC

## 2019-10-07 MED ORDER — TRIAMCINOLONE ACETONIDE 0.1 % EX CREA: TOPICAL_CREAM | CUTANEOUS | 0 refills | Status: DC

## 2019-10-08 NOTE — Progress Notes (Signed)
This visit occurred during the SARS-CoV-2 public health emergency.  Safety protocols were in place, including screening questions prior to the visit, additional usage of staff PPE, and extensive cleaning of exam room while observing appropriate contact time as indicated for disinfecting solutions.  Subjective:     Patient ID: [name] , female    DOB: [birth year] , 74 y.o.   MRN: [id number]   Chief Complaint  Patient presents with  . Dysuria    HPI  She is here today for further evaluation of possible UTI. She also has been evaluated for similar sx by GYN in the past 2 months. She is not currently sexually active.   Dysuria  This is a recurrent problem. The current episode started in the past 7 days. The problem occurs every urination. The problem has been unchanged. The quality of the pain is described as burning. The pain is at a severity of 5/10. The pain is moderate. There has been no fever. She is not sexually active. Associated symptoms include urgency. Pertinent negatives include no discharge or flank pain. The treatment provided no relief.     Past Medical History:  Diagnosis Date  . Bladder infection   . Carpal tunnel syndrome   . Complication of anesthesia    Difficult to arouse  . Diabetes mellitus   . Hypertension   . Kidney stone   . Kidney stones   . Sleep apnea      Family History  Problem Relation Age of Onset  . Heart attack Father 64  . Stroke Father 39  . Diabetes Father   . Diabetes Brother   . Diabetes Brother   . Breast cancer Sister 76  . Diabetes Mother   . Hypertension Mother      Current Outpatient Medications:  .  Ascorbic Acid (VITAMIN C) 1000 MG tablet, Take 1,000 mg by mouth daily., Disp: , Rfl:  .  Bacillus Coagulans-Inulin (PROBIOTIC FORMULA PO), Take by mouth., Disp: , Rfl:  .  aspirin EC 81 MG tablet, Take 1 tablet (81 mg total) by mouth 2 (two) times daily., Disp: 60 tablet, Rfl: 0 .  cetirizine (ZYRTEC) 10 MG tablet, Take 10  mg by mouth daily as needed for allergies. , Disp: , Rfl:  .  Cholecalciferol (VITAMIN D3) 50 MCG (2000 UT) capsule, Take 1 capsule by mouth daily at 12 noon. , Disp: , Rfl:  .  Coenzyme Q10 (COQ10) 100 MG CAPS, Take 100 mg by mouth daily with lunch. , Disp: , Rfl:  .  famotidine (PEPCID AC MAXIMUM STRENGTH) 20 MG tablet, Take 20 mg by mouth 2 (two) times daily., Disp: , Rfl:  .  fluticasone (FLONASE) 50 MCG/ACT nasal spray, Place 1 spray into both nostrils daily. (Patient taking differently: Place 1 spray into both nostrils daily as needed for allergies. ), Disp: 16 g, Rfl: 2 .  furosemide (LASIX) 20 MG tablet, Take 20 mg by mouth daily as needed (fluid). , Disp: , Rfl:  .  hydrochlorothiazide (MICROZIDE) 12.5 MG capsule, Take 12.5 mg by mouth daily as needed (excess fluid/swelling). , Disp: , Rfl:  .  losartan (COZAAR) 25 MG tablet, Take 25 mg by mouth 2 (two) times a week. Take one tablet (25 mg) by mouth twice weekly - Tuesdays and Thursdays, Disp: , Rfl:  .  metFORMIN (GLUCOPHAGE) 500 MG tablet, Take 500 mg by mouth 2 (two) times daily with a meal., Disp: , Rfl:  .  nitrofurantoin, macrocrystal-monohydrate, (MACROBID) 100 MG capsule,  Take 1 capsule (100 mg total) by mouth 2 (two) times daily for 5 days., Disp: 10 capsule, Rfl: 0 .  nystatin (MYCOSTATIN/NYSTOP) powder, Apply 1 application topically 2 (two) times daily. To affected area(s), Disp: 60 g, Rfl: 1 .  phenazopyridine (PYRIDIUM) 100 MG tablet, Take 1 tablet (100 mg total) by mouth 3 (three) times daily as needed for up to 5 days for pain., Disp: 6 tablet, Rfl: 0 .  potassium chloride SA (K-DUR) 20 MEQ tablet, Take 1 tablet (20 mEq total) by mouth daily as needed (with each dose of hydrochlorothiazide). (Patient taking differently: Take 20 mEq by mouth every morning. ), Disp: 90 tablet, Rfl: 2 .  pravastatin (PRAVACHOL) 80 MG tablet, One tablet twice weekly on Monday and Thursday., Disp: 24 tablet, Rfl: 2 .  triamcinolone cream (KENALOG)  0.1 %, APPLY TO AFFECTED AREA TWICE DAILY AS NEEDED, Disp: 45 g, Rfl: 0   Allergies  Allergen Reactions  . Haemophilus Influenzae Vaccines     Nausea, pain, body weakness  . Sulfa Antibiotics Itching  . Sulfonamide Derivatives Itching  . Tetanus Toxoids Swelling    Arm swelled at injection site     Review of Systems  Constitutional: Negative.   Respiratory: Negative.   Cardiovascular: Negative.   Gastrointestinal: Negative.   Genitourinary: Positive for dysuria and urgency. Negative for flank pain.  Skin: Positive for rash.       At end of visit, she adds that she has scaly rash and needs cream for it  Neurological: Negative.   Psychiatric/Behavioral: Negative.      Today's Vitals   10/07/19 1144  BP: 140/80  Pulse: 60  Temp: 97.6 F (36.4 C)  Weight: 218 lb 9.6 oz (99.2 kg)  Height: 5' 2.8" (1.595 m)   Body mass index is 38.97 kg/m.   Objective:  Physical Exam Vitals and nursing note reviewed.  Constitutional:      Appearance: Normal appearance.  HENT:     Head: Normocephalic and atraumatic.  Cardiovascular:     Rate and Rhythm: Normal rate and regular rhythm.     Heart sounds: Normal heart sounds.  Pulmonary:     Effort: Pulmonary effort is normal.     Breath sounds: Normal breath sounds.  Genitourinary:    Comments: No suprapubic tenderness Skin:    General: Skin is warm.     Comments: Scattered areas of hyperpigmented, scaly rashes. No erythema noted. No vesicular lesions noted.   Neurological:     General: No focal deficit present.     Mental Status: She is alert.  Psychiatric:        Mood and Affect: Mood normal.        Behavior: Behavior normal.         Assessment And Plan:     1. Dysuria  Recurrent. I will check urinalysis.   - POCT Urinalysis Dipstick (81002)  2. Recurrent UTI  She agrees to Urology evaluation. Referral placed. She was also given rx macrobid 100mg  twice daily x 5 days. She is encouraged to take full abx course.   -  Ambulatory referral to Urology - Culture, Urine  3. Allergy to sulfa drugs  This was taken into consideration when choosing abx for treating her UTI.   4. Other eczema  She was given rx triamcinolone cream to apply to affected area twice daily as needed.   [name], MD    THE PATIENT IS ENCOURAGED TO PRACTICE SOCIAL DISTANCING DUE TO THE COVID-19 PANDEMIC.

## 2019-10-12 DIAGNOSIS — M25561 Pain in right knee: Secondary | ICD-10-CM | POA: Diagnosis not present

## 2019-10-12 DIAGNOSIS — Z96651 Presence of right artificial knee joint: Secondary | ICD-10-CM | POA: Diagnosis not present

## 2019-10-12 DIAGNOSIS — M25562 Pain in left knee: Secondary | ICD-10-CM | POA: Diagnosis not present

## 2019-10-12 DIAGNOSIS — M1712 Unilateral primary osteoarthritis, left knee: Secondary | ICD-10-CM | POA: Diagnosis not present

## 2019-11-02 DIAGNOSIS — R0602 Shortness of breath: Secondary | ICD-10-CM | POA: Diagnosis not present

## 2019-11-02 DIAGNOSIS — I10 Essential (primary) hypertension: Secondary | ICD-10-CM | POA: Diagnosis not present

## 2019-11-02 DIAGNOSIS — G4733 Obstructive sleep apnea (adult) (pediatric): Secondary | ICD-10-CM | POA: Diagnosis not present

## 2019-11-02 DIAGNOSIS — R0600 Dyspnea, unspecified: Secondary | ICD-10-CM | POA: Diagnosis not present

## 2019-11-16 ENCOUNTER — Telehealth: Payer: Self-pay | Admitting: Internal Medicine

## 2019-11-16 NOTE — Chronic Care Management (AMB) (Signed)
  Chronic Care Management   Note  11/16/2019 Name: [name] MRN: [id number] DOB: 1946/01/20  [name] is a 74 y.o. year old female who is a primary care patient of Glendale Chard, MD. I reached out to Selinda Michaels by phone today in response to a referral sent by [name]'s health plan.     [name] was given information about Chronic Care Management services today including:  1. CCM service includes personalized support from designated clinical staff supervised by her physician, including individualized plan of care and coordination with other care providers 2. 24/7 contact phone numbers for assistance for urgent and routine care needs. 3. Service will only be billed when office clinical staff spend 20 minutes or more in a month to coordinate care. 4. Only one practitioner may furnish and bill the service in a calendar month. 5. The patient may stop CCM services at any time (effective at the end of the month) by phone call to the office staff. 6. The patient will be responsible for cost sharing (co-pay) of up to 20% of the service fee (after annual deductible is met).  Patient agreed to services and verbal consent obtained.   Follow up plan: Telephone appointment with care management team member scheduled for:12/17/2019  [facility]  [place], [place] [address] Direct Dial: [email or web address] Website: [email or web address]

## 2019-11-18 DIAGNOSIS — R102 Pelvic and perineal pain: Secondary | ICD-10-CM | POA: Diagnosis not present

## 2019-11-18 DIAGNOSIS — N3021 Other chronic cystitis with hematuria: Secondary | ICD-10-CM | POA: Diagnosis not present

## 2019-12-03 DIAGNOSIS — G4733 Obstructive sleep apnea (adult) (pediatric): Secondary | ICD-10-CM | POA: Diagnosis not present

## 2019-12-03 DIAGNOSIS — R0600 Dyspnea, unspecified: Secondary | ICD-10-CM | POA: Diagnosis not present

## 2019-12-03 DIAGNOSIS — I10 Essential (primary) hypertension: Secondary | ICD-10-CM | POA: Diagnosis not present

## 2019-12-03 DIAGNOSIS — R0602 Shortness of breath: Secondary | ICD-10-CM | POA: Diagnosis not present

## 2019-12-09 ENCOUNTER — Other Ambulatory Visit: Payer: Self-pay

## 2019-12-09 ENCOUNTER — Encounter: Payer: Self-pay | Admitting: Internal Medicine

## 2019-12-09 ENCOUNTER — Ambulatory Visit (INDEPENDENT_AMBULATORY_CARE_PROVIDER_SITE_OTHER): Payer: Medicare HMO | Admitting: Internal Medicine

## 2019-12-09 VITALS — BP 110/76 | HR 60 | Temp 98.2°F | Ht 62.6 in | Wt 218.4 lb

## 2019-12-09 DIAGNOSIS — M545 Low back pain, unspecified: Secondary | ICD-10-CM

## 2019-12-09 DIAGNOSIS — Z6839 Body mass index (BMI) 39.0-39.9, adult: Secondary | ICD-10-CM

## 2019-12-09 DIAGNOSIS — I10 Essential (primary) hypertension: Secondary | ICD-10-CM

## 2019-12-09 DIAGNOSIS — E1165 Type 2 diabetes mellitus with hyperglycemia: Secondary | ICD-10-CM | POA: Diagnosis not present

## 2019-12-09 NOTE — Progress Notes (Signed)
This visit occurred during the SARS-CoV-2 public health emergency.  Safety protocols were in place, including screening questions prior to the visit, additional usage of staff PPE, and extensive cleaning of exam room while observing appropriate contact time as indicated for disinfecting solutions.  Subjective:     Patient ID: [name] , female    DOB: 11/29/1945 , 74 y.o.   MRN: [id number]   Chief Complaint  Patient presents with  . Diabetes  . Hypertension    HPI  She presents today for DM/HTN f/u.  She reports compliance with meds.   Diabetes She presents for her follow-up diabetic visit. She has type 2 diabetes mellitus. Her disease course has been stable. There are no hypoglycemic associated symptoms. Pertinent negatives for diabetes include no blurred vision and no chest pain. There are no hypoglycemic complications. Risk factors for coronary artery disease include dyslipidemia, diabetes mellitus, hypertension, obesity, post-menopausal and sedentary lifestyle. She is following a generally healthy diet. Her home blood glucose trend is fluctuating minimally. Her breakfast blood glucose is taken between 8-9 am. Her breakfast blood glucose range is generally 110-130 mg/dl. An ACE inhibitor/angiotensin II receptor blocker is being taken. Eye exam is current.  Hypertension This is a chronic problem. The current episode started more than 1 year ago. The problem has been gradually improving since onset. The problem is controlled. Pertinent negatives include no blurred vision, chest pain, palpitations or shortness of breath. Risk factors for coronary artery disease include diabetes mellitus, dyslipidemia, obesity, post-menopausal state and sedentary lifestyle. Past treatments include angiotensin blockers and diuretics.     Past Medical History:  Diagnosis Date  . Bladder infection   . Carpal tunnel syndrome   . Complication of anesthesia    Difficult to arouse  . Diabetes mellitus    . Hypertension   . Kidney stone   . Kidney stones   . Sleep apnea      Family History  Problem Relation Age of Onset  . Heart attack Father 40  . Stroke Father 71  . Diabetes Father   . Diabetes Brother   . Diabetes Brother   . Breast cancer Sister 53  . Diabetes Mother   . Hypertension Mother      Current Outpatient Medications:  .  Ascorbic Acid (VITAMIN C) 1000 MG tablet, Take 1,000 mg by mouth daily., Disp: , Rfl:  .  aspirin EC 81 MG tablet, Take 1 tablet (81 mg total) by mouth 2 (two) times daily., Disp: 60 tablet, Rfl: 0 .  Bacillus Coagulans-Inulin (PROBIOTIC FORMULA PO), Take by mouth., Disp: , Rfl:  .  cetirizine (ZYRTEC) 10 MG tablet, Take 10 mg by mouth daily as needed for allergies. , Disp: , Rfl:  .  Cholecalciferol (VITAMIN D3) 50 MCG (2000 UT) capsule, Take 1 capsule by mouth daily at 12 noon. , Disp: , Rfl:  .  Coenzyme Q10 (COQ10) 100 MG CAPS, Take 100 mg by mouth daily with lunch. , Disp: , Rfl:  .  famotidine (PEPCID AC MAXIMUM STRENGTH) 20 MG tablet, Take 20 mg by mouth 2 (two) times daily., Disp: , Rfl:  .  furosemide (LASIX) 20 MG tablet, Take 20 mg by mouth daily as needed (fluid). , Disp: , Rfl:  .  hydrochlorothiazide (MICROZIDE) 12.5 MG capsule, Take 12.5 mg by mouth daily as needed (excess fluid/swelling). , Disp: , Rfl:  .  losartan (COZAAR) 25 MG tablet, Take 25 mg by mouth 2 (two) times a week. Take one tablet (25  mg) by mouth twice weekly - Tuesdays and Thursdays, Disp: , Rfl:  .  metFORMIN (GLUCOPHAGE) 500 MG tablet, Take 500 mg by mouth 2 (two) times daily with a meal., Disp: , Rfl:  .  nystatin (MYCOSTATIN/NYSTOP) powder, Apply 1 application topically 2 (two) times daily. To affected area(s), Disp: 60 g, Rfl: 1 .  potassium chloride SA (K-DUR) 20 MEQ tablet, Take 1 tablet (20 mEq total) by mouth daily as needed (with each dose of hydrochlorothiazide). (Patient taking differently: Take 20 mEq by mouth every morning. ), Disp: 90 tablet, Rfl: 2 .   pravastatin (PRAVACHOL) 80 MG tablet, One tablet twice weekly on Monday and Thursday., Disp: 24 tablet, Rfl: 2 .  triamcinolone cream (KENALOG) 0.1 %, APPLY TO AFFECTED AREA TWICE DAILY AS NEEDED, Disp: 45 g, Rfl: 0 .  carvedilol (COREG) 3.125 MG tablet, carvedilol 3.125 mg tablet  Take 1 tablet twice a day by oral route., Disp: , Rfl:  .  estradiol (ESTRACE) 0.1 MG/GM vaginal cream, estradiol 0.01% (0.1 mg/gram) vaginal cream  USE A SMALL PEA SIZED AMOUNT ON VULVA DAILY FOR 2 WEEKS, Disp: , Rfl:  .  fluticasone (FLONASE) 50 MCG/ACT nasal spray, Place 1 spray into both nostrils daily. (Patient taking differently: Place 1 spray into both nostrils daily as needed for allergies. ), Disp: 16 g, Rfl: 2 .  meloxicam (MOBIC) 15 MG tablet, Take 15 mg by mouth daily as needed., Disp: , Rfl:  .  Omega-3 Fatty Acids (FISH OIL) 1000 MG CAPS, omega-3 acid ethyl esters 1 gram capsule  Take 2 capsules twice a day by oral route., Disp: , Rfl:    Allergies  Allergen Reactions  . Haemophilus Influenzae Vaccines     Nausea, pain, body weakness  . Sulfa Antibiotics Itching  . Sulfonamide Derivatives Itching  . Tetanus Toxoids Swelling    Arm swelled at injection site     Review of Systems  Constitutional: Negative.   Eyes: Negative for blurred vision.  Respiratory: Negative.  Negative for shortness of breath.   Cardiovascular: Negative.  Negative for chest pain and palpitations.  Gastrointestinal: Negative.   Musculoskeletal: Positive for back pain.       She c/o acute exacerbation of chronic back pain. Her sx have worsened over the past several months. She denies fall/trauma. She feels stiff upon awakening. She lives in 2 level home, but does not need to use stairs daily. Sx appear to have worsened since having TKR, performed Oct 2020. Denies LE weakness/paresthesias, bowel/urinary incontinence. No perianal numbness.   Neurological: Negative.   Psychiatric/Behavioral: Negative.      Today's Vitals    12/09/19 1044  BP: 110/76  Pulse: 60  Temp: 98.2 F (36.8 C)  TempSrc: Oral  Weight: 218 lb 6.4 oz (99.1 kg)  Height: 5' 2.6" (1.59 m)   Body mass index is 39.18 kg/m.   Objective:  Physical Exam Vitals and nursing note reviewed.  Constitutional:      Appearance: Normal appearance. She is obese.  HENT:     Head: Normocephalic and atraumatic.  Cardiovascular:     Rate and Rhythm: Normal rate and regular rhythm.     Heart sounds: Normal heart sounds.  Pulmonary:     Effort: Pulmonary effort is normal.     Breath sounds: Normal breath sounds.  Musculoskeletal:     Comments: No spinal tenderness.  No flank pain to percussion. There is right lower back pain to deep palpation. Neg straight leg test  Skin:  General: Skin is warm.  Neurological:     General: No focal deficit present.     Mental Status: She is alert.  Psychiatric:        Mood and Affect: Mood normal.        Behavior: Behavior normal.         Assessment And Plan:     1. Uncontrolled type 2 diabetes mellitus with hyperglycemia (HCC)  Chronic, I will check labs as listed below.  I will adjust meds as needed. Encouraged to avoid sugary beverages.   - BMP8+EGFR - Hemoglobin A1c  2. Essential hypertension, benign  Chronic, well controlled. She will continue with current meds. She is encouraged to avoid adding salt to her foods.   3. Acute right-sided low back pain without sciatica  She agrees to PT evaluation for further evaluation/treatment. She was advised that this will help to build her core and gluteal strength. She is encouraged to perform exercises given to her as well. Importance of daily activity was discussed with the patient.   - Ambulatory referral to Physical Therapy  4. Class 2 severe obesity due to excess calories with serious comorbidity and body mass index (BMI) of 39.0 to 39.9 in adult [facility])  She is encouraged to strive for BMI less than 33 to decrease cardiac risk. She reports having  a long driveway, so she is encouraged to walk it daily. Advised to stay well hydrated and to walk early am or late pm due to the summer heat.   [name], MD    THE PATIENT IS ENCOURAGED TO PRACTICE SOCIAL DISTANCING DUE TO THE COVID-19 PANDEMIC.

## 2019-12-09 NOTE — Patient Instructions (Addendum)
Acute Back Pain, Adult Acute back pain is sudden and usually short-lived. It is often caused by an injury to the muscles and tissues in the back. The injury may result from:  A muscle or ligament getting overstretched or torn (strained). Ligaments are tissues that connect bones to each other. Lifting something improperly can cause a back strain.  Wear and tear (degeneration) of the spinal disks. Spinal disks are circular tissue that provides cushioning between the bones of the spine (vertebrae).  Twisting motions, such as while playing sports or doing yard work.  A hit to the back.  Arthritis. You may have a physical exam, lab tests, and imaging tests to find the cause of your pain. Acute back pain usually goes away with rest and home care. Follow these instructions at home: Managing pain, stiffness, and swelling  Take over-the-counter and prescription medicines only as told by your health care provider.  Your health care provider may recommend applying ice during the first 24-48 hours after your pain starts. To do this: ? Put ice in a plastic bag. ? Place a towel between your skin and the bag. ? Leave the ice on for 20 minutes, 2-3 times a day.  If directed, apply heat to the affected area as often as told by your health care provider. Use the heat source that your health care provider recommends, such as a moist heat pack or a heating pad. ? Place a towel between your skin and the heat source. ? Leave the heat on for 20-30 minutes. ? Remove the heat if your skin turns bright red. This is especially important if you are unable to feel pain, heat, or cold. You have a greater risk of getting burned. Activity   Do not stay in bed. Staying in bed for more than 1-2 days can delay your recovery.  Sit up and stand up straight. Avoid leaning forward when you sit, or hunching over when you stand. ? If you work at a desk, sit close to it so you do not need to lean over. Keep your chin tucked  in. Keep your neck drawn back, and keep your elbows bent at a right angle. Your arms should look like the letter "L." ? Sit high and close to the steering wheel when you drive. Add lower back (lumbar) support to your car seat, if needed.  Take short walks on even surfaces as soon as you are able. Try to increase the length of time you walk each day.  Do not sit, drive, or stand in one place for more than 30 minutes at a time. Sitting or standing for long periods of time can put stress on your back.  Do not drive or use heavy machinery while taking prescription pain medicine.  Use proper lifting techniques. When you bend and lift, use positions that put less stress on your back: ? Bend your knees. ? Keep the load close to your body. ? Avoid twisting.  Exercise regularly as told by your health care provider. Exercising helps your back heal faster and helps prevent back injuries by keeping muscles strong and flexible.  Work with a physical therapist to make a safe exercise program, as recommended by your health care provider. Do any exercises as told by your physical therapist. Lifestyle  Maintain a healthy weight. Extra weight puts stress on your back and makes it difficult to have good posture.  Avoid activities or situations that make you feel anxious or stressed. Stress and anxiety increase muscle   tension and can make back pain worse. Learn ways to manage anxiety and stress, such as through exercise. General instructions  Sleep on a firm mattress in a comfortable position. Try lying on your side with your knees slightly bent. If you lie on your back, put a pillow under your knees.  Follow your treatment plan as told by your health care provider. This may include: ? Cognitive or behavioral therapy. ? Acupuncture or massage therapy. ? Meditation or yoga. Contact a health care provider if:  You have pain that is not relieved with rest or medicine.  You have increasing pain going down  into your legs or buttocks.  Your pain does not improve after 2 weeks.  You have pain at night.  You lose weight without trying.  You have a fever or chills. Get help right away if:  You develop new bowel or bladder control problems.  You have unusual weakness or numbness in your arms or legs.  You develop nausea or vomiting.  You develop abdominal pain.  You feel faint. Summary  Acute back pain is sudden and usually short-lived.  Use proper lifting techniques. When you bend and lift, use positions that put less stress on your back.  Take over-the-counter and prescription medicines and apply heat or ice as directed by your health care provider. This information is not intended to replace advice given to you by your health care provider. Make sure you discuss any questions you have with your health care provider. Document Revised: 09/29/2018 Document Reviewed: 01/22/2017 Elsevier Patient Education  [place].       Diabetes Mellitus and Exercise Exercising regularly is important for your overall health, especially when you have diabetes (diabetes mellitus). Exercising is not only about losing weight. It has many other health benefits, such as increasing muscle strength and bone density and reducing body fat and stress. This leads to improved fitness, flexibility, and endurance, all of which result in better overall health. Exercise has additional benefits for people with diabetes, including:  Reducing appetite.  Helping to lower and control blood glucose.  Lowering blood pressure.  Helping to control amounts of fatty substances (lipids) in the blood, such as cholesterol and triglycerides.  Helping the body to respond better to insulin (improving insulin sensitivity).  Reducing how much insulin the body needs.  Decreasing the risk for heart disease by: ? Lowering cholesterol and triglyceride levels. ? Increasing the levels of good cholesterol. ? Lowering  blood glucose levels. What is my activity plan? Your health care provider or certified diabetes educator can help you make a plan for the type and frequency of exercise (activity plan) that works for you. Make sure that you:  Do at least 150 minutes of moderate-intensity or vigorous-intensity exercise each week. This could be brisk walking, biking, or water aerobics. ? Do stretching and strength exercises, such as yoga or weightlifting, at least 2 times a week. ? Spread out your activity over at least 3 days of the week.  Get some form of physical activity every day. ? Do not go more than 2 days in a row without some kind of physical activity. ? Avoid being inactive for more than 30 minutes at a time. Take frequent breaks to walk or stretch.  Choose a type of exercise or activity that you enjoy, and set realistic goals.  Start slowly, and gradually increase the intensity of your exercise over time. What do I need to know about managing my diabetes?   Check  your blood glucose before and after exercising. ? If your blood glucose is 240 mg/dL (28.7 mmol/L) or higher before you exercise, check your urine for ketones. If you have ketones in your urine, do not exercise until your blood glucose returns to normal. ? If your blood glucose is 100 mg/dL (5.6 mmol/L) or lower, eat a snack containing 15-20 grams of carbohydrate. Check your blood glucose 15 minutes after the snack to make sure that your level is above 100 mg/dL (5.6 mmol/L) before you start your exercise.  Know the symptoms of low blood glucose (hypoglycemia) and how to treat it. Your risk for hypoglycemia increases during and after exercise. Common symptoms of hypoglycemia can include: ? Hunger. ? Anxiety. ? Sweating and feeling clammy. ? Confusion. ? Dizziness or feeling light-headed. ? Increased heart rate or palpitations. ? Blurry vision. ? Tingling or numbness around the mouth, lips, or tongue. ? Tremors or  shakes. ? Irritability.  Keep a rapid-acting carbohydrate snack available before, during, and after exercise to help prevent or treat hypoglycemia.  Avoid injecting insulin into areas of the body that are going to be exercised. For example, avoid injecting insulin into: ? The arms, when playing tennis. ? The legs, when jogging.  Keep records of your exercise habits. Doing this can help you and your health care provider adjust your diabetes management plan as needed. Write down: ? Food that you eat before and after you exercise. ? Blood glucose levels before and after you exercise. ? The type and amount of exercise you have done. ? When your insulin is expected to peak, if you use insulin. Avoid exercising at times when your insulin is peaking.  When you start a new exercise or activity, work with your health care provider to make sure the activity is safe for you, and to adjust your insulin, medicines, or food intake as needed.  Drink plenty of water while you exercise to prevent dehydration or heat stroke. Drink enough fluid to keep your urine clear or pale yellow. Summary  Exercising regularly is important for your overall health, especially when you have diabetes (diabetes mellitus).  Exercising has many health benefits, such as increasing muscle strength and bone density and reducing body fat and stress.  Your health care provider or certified diabetes educator can help you make a plan for the type and frequency of exercise (activity plan) that works for you.  When you start a new exercise or activity, work with your health care provider to make sure the activity is safe for you, and to adjust your insulin, medicines, or food intake as needed. This information is not intended to replace advice given to you by your health care provider. Make sure you discuss any questions you have with your health care provider. Document Revised: 01/02/2017 Document Reviewed: 11/20/2015 Elsevier  Patient Education  2020 [employer].

## 2019-12-10 LAB — BMP8+EGFR
BUN/Creatinine Ratio: 15 (ref 12–28)
BUN: 11 mg/dL (ref 8–27)
CO2: 24 mmol/L (ref 20–29)
Calcium: 9.2 mg/dL (ref 8.7–10.3)
Chloride: 107 mmol/L — ABNORMAL HIGH (ref 96–106)
Creatinine, Ser: 0.71 mg/dL (ref 0.57–1.00)
GFR calc Af Amer: 96 mL/min/{1.73_m2} (ref >59)
GFR calc non Af Amer: 84 mL/min/{1.73_m2} (ref >59)
Glucose: 101 mg/dL — ABNORMAL HIGH (ref 65–99)
Potassium: 4.8 mmol/L (ref 3.5–5.2)
Sodium: 143 mmol/L (ref 134–144)

## 2019-12-10 LAB — HEMOGLOBIN A1C
Est. average glucose Bld gHb Est-mCnc: 123 mg/dL
Hgb A1c MFr Bld: 5.9 % — ABNORMAL HIGH (ref 4.8–5.6)

## 2019-12-17 ENCOUNTER — Ambulatory Visit: Payer: Medicare HMO

## 2019-12-17 ENCOUNTER — Other Ambulatory Visit: Payer: Self-pay

## 2019-12-17 DIAGNOSIS — I10 Essential (primary) hypertension: Secondary | ICD-10-CM

## 2019-12-17 DIAGNOSIS — E7800 Pure hypercholesterolemia, unspecified: Secondary | ICD-10-CM

## 2019-12-17 DIAGNOSIS — E1165 Type 2 diabetes mellitus with hyperglycemia: Secondary | ICD-10-CM

## 2019-12-17 NOTE — Chronic Care Management (AMB) (Signed)
Chronic Care Management Pharmacy  Name: [name]  MRN: [id number] DOB: Oct 24, 1945  Chief Complaint/ HPI  [name],  74 y.o. , female presents for their Initial CCM visit with the clinical pharmacist via telephone due to COVID-19 Pandemic.  PCP : Glendale Chard, MD  Their chronic conditions include: Hypertension, Obstructive sleep apnea, Osteoarthritis, Type 2 diabetes mellitus, Hyperlipidemia  Office Visits: 12/09/19 OV: Presented for HTN and DM follow up. Pt reported medication compliance. Complained of chronic back pain that has worsened over the last few months. Labs ordered (BMP8+EGFR, HgbA1c). DM and HTN chronic and well controlled, continue current medications. Referred to Physical therapy for evaluation and treatment of low back pain. Advised to stay well hydrated and walk daily.   10/07/19 OV: Presented for further evaluation of possible UTI. Evaluated for similar symptoms by gynecology in the past 2 months. Not currently sexually active. Dysuria present with every urination (burning). Urgency also present. Pt also mentioned scaly rash that she needed cream for. Urinalysis and urine culture ordered. Referred to urology for recurrent UTI. Started on Macrobid 148m twice daily for 5 days. Given Rx for triamcinolone cream to apply twice daily as needed to rash.  08/12/19 Telephone call:  HgbA1c is 6.1. Kidney function is normal. LDL is 117 (goal <70). Start twice weekly pravastatin 819m Sodium is slightly elevated, increase water intake.   08/10/19 OV: Presented for HTN and DM follow up. Labs ordered (CMP14+EGFR, Lipid panel, HgbA1c). Exercise goal of 30 minutes 5 times per week discussed. HTN well controlled, continue current medications. Pt advised that her reaction to the flu vaccine (nausea, body pain, and weakness) are actually side effects and not an allergy. Pt also mentioned allergy to tetanus vaccine (arm swelling) which is also a side effect and not an allergy. Pt  does not wish to get COVID vaccine at this time. Pt advised the importance of vaccination and seeing an allergist prior to vaccination to ensure safety.   06/09/19 OV: Presented for full physical examination and HTN/DM follow up. EKG performed, no new changes noted. HTN well controlled, continue current medications. Diabetic foot exam performed. Labs ordered (UA, UA- Microalbumin, Urine culture). Given Rx for Prevnar13 to be administered at local pharmacy. Macrobid 10082mwice daily for 7 days started on 12/22 for positive urine culture.   Consult Visits: 08/06/19 Gynecology OV w/ Dr. HorPhilis Pique2/17/20 Gynecology OV w/ Dr. HorPhilis PiqueCM Encounters: N/A  Medications: Outpatient Encounter Medications as of 12/17/2019  Medication Sig  . Ascorbic Acid (VITAMIN C) 1000 MG tablet Take 1,000 mg by mouth daily.  . aMarland Kitchenpirin EC 81 MG tablet Take 1 tablet (81 mg total) by mouth 2 (two) times daily. (Patient taking differently: Take 81 mg by mouth every other day. )  . Bacillus Coagulans-Inulin (PROBIOTIC FORMULA PO) Take 30 mg by mouth daily.   . cetirizine (ZYRTEC) 10 MG tablet Take 10 mg by mouth daily as needed for allergies.   . Cholecalciferol (VITAMIN D3) 50 MCG (2000 UT) capsule Take 1 capsule by mouth daily at 12 noon.   . Coenzyme Q10 (COQ10) 100 MG CAPS Take 100 mg by mouth daily with lunch.   . estradiol (ESTRACE) 0.1 MG/GM vaginal cream estradiol 0.01% (0.1 mg/gram) vaginal cream  USE A SMALL PEA SIZED AMOUNT ON VULVA DAILY FOR 2 WEEKS  . famotidine (PEPCID AC MAXIMUM STRENGTH) 20 MG tablet Take 20 mg by mouth 2 (two) times daily. As needed  . furosemide (LASIX) 20 MG tablet Take  20 mg by mouth daily as needed (fluid).   . hydrochlorothiazide (MICROZIDE) 12.5 MG capsule Take 12.5 mg by mouth daily as needed (excess fluid/swelling).   Marland Kitchen losartan (COZAAR) 25 MG tablet Take 25 mg by mouth 2 (two) times a week. Take one tablet (25 mg) by mouth twice weekly - Tuesdays and Thursdays  . metFORMIN  (GLUCOPHAGE) 500 MG tablet Take 500 mg by mouth 2 (two) times daily with a meal.  . potassium chloride SA (K-DUR) 20 MEQ tablet Take 1 tablet (20 mEq total) by mouth daily as needed (with each dose of hydrochlorothiazide). (Patient taking differently: Take 20 mEq by mouth every morning. )  . pravastatin (PRAVACHOL) 80 MG tablet One tablet twice weekly on Monday and Thursday.  . triamcinolone cream (KENALOG) 0.1 % APPLY TO AFFECTED AREA TWICE DAILY AS NEEDED  . carvedilol (COREG) 3.125 MG tablet carvedilol 3.125 mg tablet  Take 1 tablet twice a day by oral route. (Patient not taking: Reported on 12/17/2019)  . fluticasone (FLONASE) 50 MCG/ACT nasal spray Place 1 spray into both nostrils daily. (Patient taking differently: Place 1 spray into both nostrils daily as needed for allergies. )  . meloxicam (MOBIC) 15 MG tablet Take 15 mg by mouth daily as needed. (Patient not taking: Reported on 12/17/2019)  . nystatin (MYCOSTATIN/NYSTOP) powder Apply 1 application topically 2 (two) times daily. To affected area(s) (Patient not taking: Reported on 12/17/2019)  . Omega-3 Fatty Acids (FISH OIL) 1000 MG CAPS omega-3 acid ethyl esters 1 gram capsule  Take 2 capsules twice a day by oral route. (Patient not taking: Reported on 12/17/2019)   No facility-administered encounter medications on file as of 12/17/2019.    Current Diagnosis/Assessment:  SDOH Interventions     Most Recent Value  SDOH Interventions  Financial Strain Interventions Intervention Not Indicated      Goals Addressed            This Visit's Progress   . Pharmacy Care Plan       CARE PLAN ENTRY (see longitudinal plan of care for additional care plan information)  Current Barriers:  . Chronic Disease Management support, education, and care coordination needs related to Hypertension, Hyperlipidemia, and Diabetes   Hypertension BP Readings from Last 3 Encounters:  12/09/19 110/76  10/07/19 140/80  08/10/19 126/80   . Pharmacist  Clinical Goal(s): o Over the next 180 days, patient will work with PharmD and providers to maintain BP goal <130/80 . Current regimen:   Furosemide '20mg'$  daily as needed  Hydrochlorothiazide 12.'5mg'$  daily as needed  Losartan '25mg'$  twice weekly  Potassium 2mq daily as needed with HCTZ . Interventions: o Provided dietary and exercise recommendations . Patient self care activities - Over the next 180 days, patient will: o Check BP weekly, document, and provide at future appointments o Obtain a blood pressure monitor from insurance o Ensure daily salt intake < 2300 mg/day  Hyperlipidemia Lab Results  Component Value Date/Time   LDLCALC 117 (H) 08/10/2019 10:50 AM   . Pharmacist Clinical Goal(s): o Over the next 90 days, patient will work with PharmD and providers to achieve LDL goal < 70 . Current regimen:  . Pravastatin '80mg'$  twice weekly . CoQ10 '100mg'$  daily . Interventions: o Provided dietary and exercise recommendations o Discussed prior statin therapy (patient does not recall any) o Monitor next lipid panel. Consider increasing statin if LDL still elevated . Patient self care activities - Over the next 90 days, patient will: o Take cholesterol medication daily as  directed o Try water aerobics  Diabetes Lab Results  Component Value Date/Time   HGBA1C 5.9 (H) 12/09/2019 11:19 AM   HGBA1C 6.1 (H) 08/10/2019 10:50 AM   . Pharmacist Clinical Goal(s): o Over the next 180 days, patient will work with PharmD and providers to maintain A1c goal <7% . Current regimen:  o Metformin 596m twice daily with a meal . Interventions: o Provided dietary and exercise recommendations . Patient self care activities - Over the next 180 days, patient will: o Check blood sugar every 2-3 days, document, and provide at future appointments o Contact provider with any episodes of hypoglycemia o Try water aerobics  Medication management . Pharmacist Clinical Goal(s): o Over the next 90 days,  patient will work with PharmD and providers to maintain optimal medication adherence . Current pharmacy: [facility] . Interventions o Comprehensive medication review performed. o Continue current medication management strategy . Patient self care activities - Over the next 90 days, patient will: o Focus on medication adherence by continued use of pill box o Take medications as prescribed o Report any questions or concerns to PharmD and/or provider(s)  Initial goal documentation        Diabetes   Recent Relevant Labs: Lab Results  Component Value Date/Time   HGBA1C 5.9 (H) 12/09/2019 11:19 AM   HGBA1C 6.1 (H) 08/10/2019 10:50 AM   MICROALBUR 10 06/09/2019 04:57 PM    Checking BG: Every 2-3 days  Recent BG (some FBG, pre-meal, HS) Readings: 97, 100 (usually <107) Patient has failed these meds in past: N/A Patient is currently controlled on the following medications:   Metformin 5082mtwice daily with a meal  Aspirin 8129mvery other day  Last diabetic Foot exam: 06/09/19 Last diabetic Eye exam: Lab Results  Component Value Date/Time   HMDIABEYEEXA No Retinopathy 02/18/2019 12:00 AM    We discussed:  Diet extensively  Doesn't eat early (breakfast around 11AM-12PM): grits, eggs, bacon, toast, cereal  Tuna salad, vegetables, not much meat (sometimes chicken or salmon); sometimes fried fish (twice a month)  5-16 oz glasses of water daily  Diluted sweet tea, lemonade, minimal sodas  Occasionally metformin makes her stomach feel queasy but not often  Exercise extensively  Chair exercises every once in a while  Works in the yard  Knee/hip prevents her from doing as much as she would like (one leg is longer than the other and causes some irritation)  Starting physical therapy next week  Recommend water aerobics  Aspirin dose decreased to every other day due to bruising  Plan Continue current medications    Hypertension   Office blood pressures are  BP  Readings from Last 3 Encounters:  12/09/19 110/76  10/07/19 140/80  08/10/19 126/80   Patient has failed these meds in the past: Atenolol Patient is currently controlled on the following medications:   Furosemide 21m26mily as needed  Hydrochlorothiazide 12.5mg 37mly as needed  Losartan 25mg 54me weekly  Potassium 21mEq 62my as needed with HCTZ  Patient checks BP at home Never, does not have BP monitor at home  Patient home BP readings are ranging: None to provide  We discussed: . Diet and exercise extensively  Tries to avoid salt  Pt mentioned intermittent hand tingling/itching and asked if it could be from her medications  Told pt that it could be from HCTZ or furosemide, but that is not a common side effect  Plan Continue current medications  Discuss pt obtaining BP monitor through insurance or DME  Hyperlipidemia   LDL goal < 70  Lipid Panel     Component Value Date/Time   CHOL 205 (H) 08/10/2019 1050   TRIG 70 08/10/2019 1050   HDL 75 08/10/2019 1050   LDLCALC 117 (H) 08/10/2019 1050    Hepatic Function Latest Ref Rng & Units 08/10/2019 12/31/2018 09/02/2018  Total Protein 6.0 - 8.5 g/dL 6.6 6.4 6.4  Albumin 3.7 - 4.7 g/dL 3.9 3.9 4.2  AST 0 - 40 IU/L _0 ALT 0 - 32 IU/L _1 Alk Phosphatase 39 - 117 IU/L 103 94 91  Total Bilirubin 0.0 - 1.2 mg/dL 0.3 <0.2 0.3  Bilirubin, Direct <=0.2 mg/dL - - -     The 10-year ASCVD risk score Mikey Bussing DC Jr., et al., 2013) is: 30.7%   Values used to calculate the score:     Age: 15 years     Sex: Female     Is Non-Hispanic African American: Yes     Diabetic: Yes     Tobacco smoker: No     Systolic Blood Pressure: 144 mmHg     Is BP treated: Yes     HDL Cholesterol: 75 mg/dL     Total Cholesterol: 205 mg/dL   Patient has failed these meds in past: [place] Patient is currently uncontrolled on the following medications:  . Pravastatin 66m twice weekly . CoQ10 1050mdaily  We discussed: . Diet and  exercise extensively . Pt states that she does not remember being on cholesterol medications prior to taking pravastatin 8069mwice daily . Denies any side effects from pravastatin  Plan Continue current medications  Monitor lipid panel. Discuss with PCP increasing statin if no improvement on next lipid panel  Osteopenia / Osteoporosis   Last DEXA Scan: Not on file  Vit D, 25-Hydroxy  Date Value Ref Range Status  12/31/2018 40.6 30.0 - 100.0 ng/mL Final    Comment:    Vitamin D deficiency has been defined by the Institute of Medicine and an Endocrine Society practice guideline as a level of serum 25-OH vitamin D less than 20 ng/mL (1,2). The Endocrine Society went on to further define vitamin D insufficiency as a level between 21 and 29 ng/mL (2). 1. IOM (Institute of Medicine). 2010. Dietary reference    intakes for calcium and D. [place]    [employer]. Holick MF, Binkley [place], Bischoff-Ferrari HA, et al.    Evaluation, treatment, and prevention of vitamin D    deficiency: an Endocrine Society clinical practice    guideline. JCEM. 2011 Jul; 96(7):1911-30.     Patient is not a candidate for pharmacologic treatment  Patient has failed these meds in past: N/A Patient is currently controlled on the following medications:   Cholecalciferol 2000 units daily  Plan Continue current medications Request DEXA scan  Vaccines   Reviewed and discussed patient's vaccination history.    Immunization History  Administered Date(s) Administered  . PFIZER SARS-COV-2 Vaccination 09/02/2019, 09/27/2019   Does not want to get Prevnar vaccine due to reactions to tetanus and flu shots  Plan Had nausea, throat felt funny when taking COVID vaccine. Had to stay for observation for an hour and had to get Benadryl.   Medication Management   Pt uses [facility] for all medications Uses pill box? Yes Pt endorses 100% compliance  We discussed:   Importance  of daily medication adherence  Plan Continue current medication management strategy  Verify pharmacy preferred status  on insurance Discuss medication synchronization and adherence packaging at follow up  Follow up: 4 month phone visit  Jannette Fogo, PharmD Clinical Pharmacist [facility] [phone]

## 2019-12-20 ENCOUNTER — Ambulatory Visit: Payer: Self-pay

## 2019-12-20 ENCOUNTER — Telehealth: Payer: Self-pay

## 2019-12-20 DIAGNOSIS — Z1231 Encounter for screening mammogram for malignant neoplasm of breast: Secondary | ICD-10-CM | POA: Diagnosis not present

## 2019-12-20 DIAGNOSIS — I10 Essential (primary) hypertension: Secondary | ICD-10-CM

## 2019-12-20 DIAGNOSIS — E1165 Type 2 diabetes mellitus with hyperglycemia: Secondary | ICD-10-CM

## 2019-12-20 LAB — HM MAMMOGRAPHY

## 2019-12-20 NOTE — Chronic Care Management (AMB) (Signed)
  Chronic Care Management   Outreach Note  12/20/2019 Name: [name] MRN: [id number] DOB: 05/22/1946  Referred by: Dorothyann Peng, MD Reason for referral : Care Coordination   SW placed an unsuccessful outbound call to the patient to conduct an SDOH (social determinants of health) screen. SW left a HIPAA compliant voice message requesting a return call.  Follow Up Plan: The care management team will reach out to the patient again over the next 14 days.   Bevelyn Ngo, BSW, CDP Social Worker, Certified Dementia Practitioner [facility] Care Management [phone]

## 2019-12-21 DIAGNOSIS — M545 Low back pain: Secondary | ICD-10-CM | POA: Diagnosis not present

## 2019-12-21 DIAGNOSIS — M6281 Muscle weakness (generalized): Secondary | ICD-10-CM | POA: Diagnosis not present

## 2019-12-21 DIAGNOSIS — M25551 Pain in right hip: Secondary | ICD-10-CM | POA: Diagnosis not present

## 2019-12-21 DIAGNOSIS — R262 Difficulty in walking, not elsewhere classified: Secondary | ICD-10-CM | POA: Diagnosis not present

## 2019-12-23 DIAGNOSIS — M545 Low back pain: Secondary | ICD-10-CM | POA: Diagnosis not present

## 2019-12-23 DIAGNOSIS — R262 Difficulty in walking, not elsewhere classified: Secondary | ICD-10-CM | POA: Diagnosis not present

## 2019-12-23 DIAGNOSIS — M6281 Muscle weakness (generalized): Secondary | ICD-10-CM | POA: Diagnosis not present

## 2019-12-23 DIAGNOSIS — M25551 Pain in right hip: Secondary | ICD-10-CM | POA: Diagnosis not present

## 2019-12-29 ENCOUNTER — Encounter: Payer: Self-pay | Admitting: Internal Medicine

## 2019-12-30 ENCOUNTER — Ambulatory Visit: Payer: Medicare HMO

## 2019-12-30 DIAGNOSIS — E1165 Type 2 diabetes mellitus with hyperglycemia: Secondary | ICD-10-CM

## 2019-12-30 DIAGNOSIS — I10 Essential (primary) hypertension: Secondary | ICD-10-CM

## 2019-12-30 NOTE — Chronic Care Management (AMB) (Signed)
Chronic Care Management    Social Work Follow Up Note  12/30/2019 Name: [name] MRN: [id number] DOB: 12-17-1945  [name] is a 74 y.o. year old female who is a primary care patient of Dorothyann Peng, MD. The CCM team was consulted for assistance with care coordination.   Review of patient status, including review of consultants reports, other relevant assessments, and collaboration with appropriate care team members and the patient's provider was performed as part of comprehensive patient evaluation and provision of chronic care management services.    SDOH (Social Determinants of Health) assessments performed: Yes, no acute challenges at this time. SDOH Interventions     Most Recent Value  SDOH Interventions  Food Insecurity Interventions Intervention Not Indicated  Housing Interventions Intervention Not Indicated  Transportation Interventions Intervention Not Indicated       Outpatient Encounter Medications as of 12/30/2019  Medication Sig   Ascorbic Acid (VITAMIN C) 1000 MG tablet Take 1,000 mg by mouth daily.   aspirin EC 81 MG tablet Take 1 tablet (81 mg total) by mouth 2 (two) times daily. (Patient taking differently: Take 81 mg by mouth every other day. )   Bacillus Coagulans-Inulin (PROBIOTIC FORMULA PO) Take by mouth.   carvedilol (COREG) 3.125 MG tablet carvedilol 3.125 mg tablet  Take 1 tablet twice a day by oral route. (Patient not taking: Reported on 12/17/2019)   cetirizine (ZYRTEC) 10 MG tablet Take 10 mg by mouth daily as needed for allergies.    Cholecalciferol (VITAMIN D3) 50 MCG (2000 UT) capsule Take 1 capsule by mouth daily at 12 noon.    Coenzyme Q10 (COQ10) 100 MG CAPS Take 100 mg by mouth daily with lunch.    estradiol (ESTRACE) 0.1 MG/GM vaginal cream estradiol 0.01% (0.1 mg/gram) vaginal cream  USE A SMALL PEA SIZED AMOUNT ON VULVA DAILY FOR 2 WEEKS   famotidine (PEPCID AC MAXIMUM STRENGTH) 20 MG tablet Take 20 mg by mouth 2 (two) times  daily.   fluticasone (FLONASE) 50 MCG/ACT nasal spray Place 1 spray into both nostrils daily. (Patient taking differently: Place 1 spray into both nostrils daily as needed for allergies. )   furosemide (LASIX) 20 MG tablet Take 20 mg by mouth daily as needed (fluid).    hydrochlorothiazide (MICROZIDE) 12.5 MG capsule Take 12.5 mg by mouth daily as needed (excess fluid/swelling).    losartan (COZAAR) 25 MG tablet Take 25 mg by mouth 2 (two) times a week. Take one tablet (25 mg) by mouth twice weekly - Tuesdays and Thursdays   meloxicam (MOBIC) 15 MG tablet Take 15 mg by mouth daily as needed. (Patient not taking: Reported on 12/17/2019)   metFORMIN (GLUCOPHAGE) 500 MG tablet Take 500 mg by mouth 2 (two) times daily with a meal.   nystatin (MYCOSTATIN/NYSTOP) powder Apply 1 application topically 2 (two) times daily. To affected area(s) (Patient not taking: Reported on 12/17/2019)   Omega-3 Fatty Acids (FISH OIL) 1000 MG CAPS omega-3 acid ethyl esters 1 gram capsule  Take 2 capsules twice a day by oral route. (Patient not taking: Reported on 12/17/2019)   potassium chloride SA (K-DUR) 20 MEQ tablet Take 1 tablet (20 mEq total) by mouth daily as needed (with each dose of hydrochlorothiazide). (Patient taking differently: Take 20 mEq by mouth every morning. )   pravastatin (PRAVACHOL) 80 MG tablet One tablet twice weekly on Monday and Thursday.   triamcinolone cream (KENALOG) 0.1 % APPLY TO AFFECTED AREA TWICE DAILY AS NEEDED   No facility-administered  encounter medications on file as of 12/30/2019.    Follow Up Plan: No SW follow up planned at this time. The patient is encouraged to contact SW as needed with future resource needs. The patient will remain active with embedded PharmD.   Bevelyn Ngo, BSW, CDP Social Worker, Certified Dementia Practitioner [facility] Care Management [phone]

## 2020-01-02 DIAGNOSIS — R0602 Shortness of breath: Secondary | ICD-10-CM | POA: Diagnosis not present

## 2020-01-02 DIAGNOSIS — G4733 Obstructive sleep apnea (adult) (pediatric): Secondary | ICD-10-CM | POA: Diagnosis not present

## 2020-01-02 DIAGNOSIS — I10 Essential (primary) hypertension: Secondary | ICD-10-CM | POA: Diagnosis not present

## 2020-01-02 DIAGNOSIS — R0600 Dyspnea, unspecified: Secondary | ICD-10-CM | POA: Diagnosis not present

## 2020-01-04 DIAGNOSIS — M6281 Muscle weakness (generalized): Secondary | ICD-10-CM | POA: Diagnosis not present

## 2020-01-04 DIAGNOSIS — M545 Low back pain: Secondary | ICD-10-CM | POA: Diagnosis not present

## 2020-01-04 DIAGNOSIS — R262 Difficulty in walking, not elsewhere classified: Secondary | ICD-10-CM | POA: Diagnosis not present

## 2020-01-04 DIAGNOSIS — M25551 Pain in right hip: Secondary | ICD-10-CM | POA: Diagnosis not present

## 2020-01-06 DIAGNOSIS — R262 Difficulty in walking, not elsewhere classified: Secondary | ICD-10-CM | POA: Diagnosis not present

## 2020-01-06 DIAGNOSIS — M25551 Pain in right hip: Secondary | ICD-10-CM | POA: Diagnosis not present

## 2020-01-06 DIAGNOSIS — M6281 Muscle weakness (generalized): Secondary | ICD-10-CM | POA: Diagnosis not present

## 2020-01-06 DIAGNOSIS — M545 Low back pain: Secondary | ICD-10-CM | POA: Diagnosis not present

## 2020-01-10 ENCOUNTER — Ambulatory Visit: Payer: Self-pay

## 2020-01-10 ENCOUNTER — Other Ambulatory Visit: Payer: Self-pay

## 2020-01-10 ENCOUNTER — Telehealth: Payer: Medicare HMO

## 2020-01-10 DIAGNOSIS — I10 Essential (primary) hypertension: Secondary | ICD-10-CM

## 2020-01-10 DIAGNOSIS — E1165 Type 2 diabetes mellitus with hyperglycemia: Secondary | ICD-10-CM

## 2020-01-10 NOTE — Patient Instructions (Signed)
Visit Information  Goals Addressed            This Visit's Progress   . Pharmacy Care Plan       CARE PLAN ENTRY (see longitudinal plan of care for additional care plan information)  Current Barriers:  . Chronic Disease Management support, education, and care coordination needs related to Hypertension, Hyperlipidemia, and Diabetes   Hypertension BP Readings from Last 3 Encounters:  12/09/19 110/76  10/07/19 140/80  08/10/19 126/80   . Pharmacist Clinical Goal(s): o Over the next 180 days, patient will work with PharmD and providers to maintain BP goal <130/80 . Current regimen:   Furosemide 20mg  daily as needed  Hydrochlorothiazide 12.5mg  daily as needed  Losartan 25mg  twice weekly  Potassium daily as needed with HCTZ . Interventions: o Provided dietary and exercise recommendations . Patient self care activities - Over the next 180 days, patient will: o Check BP weekly, document, and provide at future appointments o Obtain a blood pressure monitor from insurance o Ensure daily salt intake < 2300 mg/day  Hyperlipidemia Lab Results  Component Value Date/Time   LDLCALC 117 (H) 08/10/2019 10:50 AM   . Pharmacist Clinical Goal(s): o Over the next 90 days, patient will work with PharmD and providers to achieve LDL goal < 70 . Current regimen:  . Pravastatin 80mg  twice weekly . CoQ10 100mg  daily . Interventions: o Provided dietary and exercise recommendations o Discussed prior statin therapy (patient does not recall any) o Monitor next lipid panel. Consider increasing statin if LDL still elevated . Patient self care activities - Over the next 90 days, patient will: o Take cholesterol medication daily as directed o Try water aerobics  Diabetes Lab Results  Component Value Date/Time   HGBA1C 5.9 (H) 12/09/2019 11:19 AM   HGBA1C 6.1 (H) 08/10/2019 10:50 AM   . Pharmacist Clinical Goal(s): o Over the next 180 days, patient will work with PharmD and providers  to maintain A1c goal <7% . Current regimen:  o Metformin 500mg  twice daily with a meal . Interventions: o Provided dietary and exercise recommendations . Patient self care activities - Over the next 180 days, patient will: o Check blood sugar every 2-3 days, document, and provide at future appointments o Contact provider with any episodes of hypoglycemia o Try water aerobics  Medication management . Pharmacist Clinical Goal(s): o Over the next 90 days, patient will work with PharmD and providers to maintain optimal medication adherence . Current pharmacy: [facility] . Interventions o Comprehensive medication review performed. o Continue current medication management strategy . Patient self care activities - Over the next 90 days, patient will: o Focus on medication adherence by continued use of pill box o Take medications as prescribed o Report any questions or concerns to PharmD and/or provider(s)  Initial goal documentation        [name] was given information about Chronic Care Management services today including:  1. CCM service includes personalized support from designated clinical staff supervised by her physician, including individualized plan of care and coordination with other care providers 2. 24/7 contact phone numbers for assistance for urgent and routine care needs. 3. Standard insurance, coinsurance, copays and deductibles apply for chronic care management only during months in which we provide at least 20 minutes of these services. Most insurances cover these services at 100%, however patients may be responsible for any copay, coinsurance and/or deductible if applicable. This service may help you avoid the need for more expensive face-to-face services. 4.  Only one practitioner may furnish and bill the service in a calendar month. 5. The patient may stop CCM services at any time (effective at the end of the month) by phone call to the office staff.  Patient agreed to  services and verbal consent obtained.   Patient verbalizes understanding of instructions provided today.  Telephone follow up appointment with pharmacy team member scheduled for: 04/18/20 @ 3:30 PM  Beryle Flock, PharmD Clinical Pharmacist [facility] [phone]

## 2020-01-11 DIAGNOSIS — M545 Low back pain: Secondary | ICD-10-CM | POA: Diagnosis not present

## 2020-01-11 DIAGNOSIS — M25551 Pain in right hip: Secondary | ICD-10-CM | POA: Diagnosis not present

## 2020-01-11 DIAGNOSIS — M6281 Muscle weakness (generalized): Secondary | ICD-10-CM | POA: Diagnosis not present

## 2020-01-11 DIAGNOSIS — R262 Difficulty in walking, not elsewhere classified: Secondary | ICD-10-CM | POA: Diagnosis not present

## 2020-01-11 NOTE — Chronic Care Management (AMB) (Signed)
  Chronic Care Management   Outreach Note  01/11/2020 Name: [name] MRN: [id number] DOB: 1946-01-22  Referred by: Dorothyann Peng, MD Reason for referral : Chronic Care Management (RQ Initial RN CM Call )   An unsuccessful telephone outreach was attempted today. The patient was referred to the case management team for assistance with care management and care coordination.   Follow Up Plan: A HIPPA compliant phone message was left for the patient providing contact information and requesting a return call.  Telephone follow up appointment with care management team member scheduled for: 02/23/20  Delsa Sale, RN, BSN, CCM Care Management Coordinator [facility] Care Management/[facility]  Direct Phone: [phone]

## 2020-01-13 DIAGNOSIS — M6281 Muscle weakness (generalized): Secondary | ICD-10-CM | POA: Diagnosis not present

## 2020-01-13 DIAGNOSIS — M545 Low back pain: Secondary | ICD-10-CM | POA: Diagnosis not present

## 2020-01-13 DIAGNOSIS — M25551 Pain in right hip: Secondary | ICD-10-CM | POA: Diagnosis not present

## 2020-01-13 DIAGNOSIS — R262 Difficulty in walking, not elsewhere classified: Secondary | ICD-10-CM | POA: Diagnosis not present

## 2020-01-17 DIAGNOSIS — N3021 Other chronic cystitis with hematuria: Secondary | ICD-10-CM | POA: Diagnosis not present

## 2020-01-17 DIAGNOSIS — R102 Pelvic and perineal pain: Secondary | ICD-10-CM | POA: Diagnosis not present

## 2020-01-18 DIAGNOSIS — M545 Low back pain: Secondary | ICD-10-CM | POA: Diagnosis not present

## 2020-01-18 DIAGNOSIS — R262 Difficulty in walking, not elsewhere classified: Secondary | ICD-10-CM | POA: Diagnosis not present

## 2020-01-18 DIAGNOSIS — M6281 Muscle weakness (generalized): Secondary | ICD-10-CM | POA: Diagnosis not present

## 2020-01-18 DIAGNOSIS — M25551 Pain in right hip: Secondary | ICD-10-CM | POA: Diagnosis not present

## 2020-01-20 DIAGNOSIS — R262 Difficulty in walking, not elsewhere classified: Secondary | ICD-10-CM | POA: Diagnosis not present

## 2020-01-20 DIAGNOSIS — M6281 Muscle weakness (generalized): Secondary | ICD-10-CM | POA: Diagnosis not present

## 2020-01-20 DIAGNOSIS — M545 Low back pain: Secondary | ICD-10-CM | POA: Diagnosis not present

## 2020-01-20 DIAGNOSIS — M25551 Pain in right hip: Secondary | ICD-10-CM | POA: Diagnosis not present

## 2020-01-25 DIAGNOSIS — M6281 Muscle weakness (generalized): Secondary | ICD-10-CM | POA: Diagnosis not present

## 2020-01-25 DIAGNOSIS — M25551 Pain in right hip: Secondary | ICD-10-CM | POA: Diagnosis not present

## 2020-01-25 DIAGNOSIS — R262 Difficulty in walking, not elsewhere classified: Secondary | ICD-10-CM | POA: Diagnosis not present

## 2020-01-25 DIAGNOSIS — M545 Low back pain: Secondary | ICD-10-CM | POA: Diagnosis not present

## 2020-02-02 DIAGNOSIS — R0602 Shortness of breath: Secondary | ICD-10-CM | POA: Diagnosis not present

## 2020-02-02 DIAGNOSIS — R0600 Dyspnea, unspecified: Secondary | ICD-10-CM | POA: Diagnosis not present

## 2020-02-02 DIAGNOSIS — G4733 Obstructive sleep apnea (adult) (pediatric): Secondary | ICD-10-CM | POA: Diagnosis not present

## 2020-02-02 DIAGNOSIS — I10 Essential (primary) hypertension: Secondary | ICD-10-CM | POA: Diagnosis not present

## 2020-02-07 DIAGNOSIS — R69 Illness, unspecified: Secondary | ICD-10-CM | POA: Diagnosis not present

## 2020-02-23 ENCOUNTER — Other Ambulatory Visit: Payer: Self-pay

## 2020-02-23 ENCOUNTER — Encounter: Payer: Self-pay | Admitting: Cardiology

## 2020-02-23 ENCOUNTER — Telehealth: Payer: Self-pay

## 2020-02-23 ENCOUNTER — Ambulatory Visit: Payer: Medicare HMO | Admitting: Cardiology

## 2020-02-23 ENCOUNTER — Telehealth: Payer: Medicare HMO

## 2020-02-23 VITALS — BP 126/71 | HR 64 | Resp 16 | Ht 62.0 in | Wt 222.0 lb

## 2020-02-23 DIAGNOSIS — R252 Cramp and spasm: Secondary | ICD-10-CM

## 2020-02-23 DIAGNOSIS — R002 Palpitations: Secondary | ICD-10-CM | POA: Diagnosis not present

## 2020-02-23 DIAGNOSIS — E7800 Pure hypercholesterolemia, unspecified: Secondary | ICD-10-CM | POA: Diagnosis not present

## 2020-02-23 DIAGNOSIS — R1900 Intra-abdominal and pelvic swelling, mass and lump, unspecified site: Secondary | ICD-10-CM | POA: Diagnosis not present

## 2020-02-23 DIAGNOSIS — R0609 Other forms of dyspnea: Secondary | ICD-10-CM | POA: Diagnosis not present

## 2020-02-23 DIAGNOSIS — R0600 Dyspnea, unspecified: Secondary | ICD-10-CM

## 2020-02-23 MED ORDER — ROSUVASTATIN CALCIUM 10 MG PO TABS: 10.0000 mg | ORAL_TABLET | Freq: Every day | ORAL | 2 refills | Status: DC

## 2020-02-23 NOTE — Telephone Encounter (Cosign Needed)
  Chronic Care Management   Outreach Note  02/23/2020 Name: [name] MRN: [id number] DOB: [birth year]  Referred by: Dorothyann Peng, MD Reason for referral : Chronic Care Management (RQ #2 Initial RN CM Call )   A second unsuccessful telephone outreach was attempted today. The patient was referred to the case management team for assistance with care management and care coordination.   Follow Up Plan: Telephone follow up appointment with care management team member scheduled for: 04/11/20  Delsa Sale, RN, BSN, CCM Care Management Coordinator [facility] Care Management/[facility]  Direct Phone: [phone]

## 2020-02-23 NOTE — Progress Notes (Signed)
Primary Physician/Referring:  Dorothyann Peng, MD  Patient ID: [name], female    DOB: 1946-03-14, 74 y.o.   MRN: [id number]  Chief Complaint  Patient presents with  . Palpitations  . low heart rate  . Follow-up    HPI: [name]  is a 74 y.o. female  with  diabetes mellitus, hypertension, hyperlipidemia, morbid obesity, nocturnal hypoxemia presently on oxygen supplementation, chronic dyspnea and palpitations. She also has GERD and hiatal hernia.   She presents here for annual visit, she underwent knee replacement October 2020, since then she has noticed decreased physical capacity and also worsening dyspnea on exertion and palpitations with exertion activity.  No associated chest tightness or chest heaviness.  No dizziness or syncope.  She has occasional night cramps.  Past Medical History:  Diagnosis Date  . Bladder infection   . Carpal tunnel syndrome   . Complication of anesthesia    Difficult to arouse  . Diabetes mellitus   . Hypertension   . Kidney stone   . Kidney stones   . Sleep apnea     Past Surgical History:  Procedure Laterality Date  . ABDOMINAL HYSTERECTOMY     partial  . APPENDECTOMY    . CARPAL TUNNEL RELEASE Bilateral 1980  . HERNIA REPAIR    . TONSILLECTOMY    . TOTAL KNEE ARTHROPLASTY Right 04/19/2019   Procedure: RIGHT TOTAL KNEE ARTHROPLASTY;  Surgeon: Gean Birchwood, MD;  Location: [facility];  Service: Orthopedics;  Laterality: Right;    Social History   Tobacco Use  . Smoking status: Never Smoker  . Smokeless tobacco: Never Used  Substance Use Topics  . Alcohol use: No   Marital Status: Widowed   Review of Systems  Constitutional: Negative for malaise/fatigue.  Cardiovascular: Positive for dyspnea on exertion and palpitations. Negative for claudication, leg swelling (chronic and stable) and syncope.  Hematologic/Lymphatic: Does not bruise/bleed easily.  Musculoskeletal: Positive for joint pain. Negative for joint swelling.   All other systems reviewed and are negative.  Objective  Blood pressure 126/71, pulse 64, resp. rate 16, height 5\' 2"  (1.575 m), weight 222 lb (100.7 kg), SpO2 96 %. Body mass index is 40.6 kg/m.  Vitals with BMI 02/23/2020 12/09/2019 10/07/2019  Height 5\' 2"  5' 2.6" 5' 2.8"  Weight 222 lbs 218 lbs 6 oz 218 lbs 10 oz  BMI 40.59 39.19 38.98  Systolic 126 110 10/09/2019  Diastolic 71 76 80  Pulse 64 60 60    Physical Exam Constitutional:      Appearance: She is obese.  Cardiovascular:     Rate and Rhythm: Normal rate and regular rhythm.     Pulses:          Carotid pulses are 2+ on the right side and 2+ on the left side.      Femoral pulses are 2+ on the right side and 2+ on the left side.      Popliteal pulses are 2+ on the right side and 2+ on the left side.       Dorsalis pedis pulses are 2+ on the right side and 2+ on the left side.       Posterior tibial pulses are 2+ on the right side and 0 on the left side.     Heart sounds: Normal heart sounds. No murmur heard.  No gallop.      Comments: No leg edema, no JVD. Pulsatile abdominal mass measures 4 cm. Pulmonary:     Effort: Pulmonary  effort is normal.     Breath sounds: Normal breath sounds.  Abdominal:     General: Bowel sounds are normal.     Palpations: Abdomen is soft.    Radiology: No results found.  Laboratory examination:    CMP Latest Ref Rng & Units 12/09/2019 08/10/2019 04/20/2019  Glucose 65 - 99 mg/dL 850(Y) 774(J) 287(O)  BUN 8 - 27 mg/dL 11 12 10   Creatinine 0.57 - 1.00 mg/dL 6.76 7.20  Sodium 134 - 144 mmol/L 143 145(H) 139  Potassium 3.5 - 5.2 mmol/L 4.8 4.2 4.2  Chloride 96 - 106 mmol/L 107(H) 107(H) 107  CO2 20 - 29 mmol/L 24 24 25   Calcium 8.7 - 10.3 mg/dL 9.2 9.3 9.47)  Total Protein 6.0 - 8.5 g/dL - 6.6 -  Total Bilirubin 0.0 - 1.2 mg/dL - 0.3 -  Alkaline Phos 39 - 117 IU/L - 103 -  AST 0 - 40 IU/L - 12 -  ALT 0 - 32 IU/L - 7 -   CBC Latest Ref Rng & Units 04/22/2019 04/21/2019 04/20/2019   WBC 4.0 - 10.5 K/uL 10.5 11.0(H) 9.5  Hemoglobin 12.0 - 15.0 g/dL 10.2(L) 10.0(L) 10.8(L)  Hematocrit 36 - 46 % 32.9(L) 32.1(L) 35.7(L)  Platelets 150 - 400 K/uL 205 217 220   Lipid Panel Recent Labs    08/10/19 1050  CHOL 205*  TRIG 70  LDLCALC 117*  HDL 75  CHOLHDL 2.7    HEMOGLOBIN A1C Lab Results  Component Value Date   HGBA1C 5.9 (H) 12/09/2019   MPG 128.37 04/19/2019   TSH No results for input(s): TSH in the last 8760 hours.   Outpatient Medications Prior to Visit  Medication Sig Dispense Refill  . Ascorbic Acid (VITAMIN C) 1000 MG tablet Take 1,000 mg by mouth daily.    12/11/2019 aspirin EC 81 MG tablet Take 1 tablet (81 mg total) by mouth 2 (two) times daily. (Patient taking differently: Take 81 mg by mouth every other day. ) 60 tablet 0  . Bacillus Coagulans-Inulin (PROBIOTIC FORMULA PO) Take 30 mg by mouth daily.     . carvedilol (COREG) 3.125 MG tablet carvedilol 3.125 mg tablet  Take 1 tablet twice a day by oral route.    . cetirizine (ZYRTEC) 10 MG tablet Take 10 mg by mouth daily as needed for allergies.     . Cholecalciferol (VITAMIN D3) 50 MCG (2000 UT) capsule Take 1 capsule by mouth daily at 12 noon.     . Coenzyme Q10 (COQ10) 100 MG CAPS Take 100 mg by mouth daily with lunch.     . estradiol (ESTRACE) 0.1 MG/GM vaginal cream estradiol 0.01% (0.1 mg/gram) vaginal cream  USE A SMALL PEA SIZED AMOUNT ON VULVA DAILY FOR 2 WEEKS    . famotidine (PEPCID AC MAXIMUM STRENGTH) 20 MG tablet Take 20 mg by mouth 2 (two) times daily. As needed    . fluticasone (FLONASE) 50 MCG/ACT nasal spray Place 1 spray into both nostrils daily. (Patient taking differently: Place 1 spray into both nostrils daily as needed for allergies. ) 16 g 2  . furosemide (LASIX) 20 MG tablet Take 20 mg by mouth daily as needed (fluid).     . hydrochlorothiazide (MICROZIDE) 12.5 MG capsule Take 12.5 mg by mouth daily as needed (excess fluid/swelling).     04/21/2019 losartan (COZAAR) 25 MG tablet Take 25 mg by  mouth 2 (two) times a week. Take one tablet (25 mg) by mouth twice weekly - Tuesdays and Thursdays    .  meloxicam (MOBIC) 15 MG tablet Take 15 mg by mouth daily as needed.     . metFORMIN (GLUCOPHAGE) 500 MG tablet Take 500 mg by mouth 2 (two) times daily with a meal.    . potassium chloride SA (K-DUR) 20 MEQ tablet Take 1 tablet (20 mEq total) by mouth daily as needed (with each dose of hydrochlorothiazide). (Patient taking differently: Take 20 mEq by mouth every morning. ) 90 tablet 2  . triamcinolone cream (KENALOG) 0.1 % APPLY TO AFFECTED AREA TWICE DAILY AS NEEDED 45 g 0  . pravastatin (PRAVACHOL) 80 MG tablet One tablet twice weekly on Monday and Thursday. 24 tablet 2  . nystatin (MYCOSTATIN/NYSTOP) powder Apply 1 application topically 2 (two) times daily. To affected area(s) 60 g 1  . Omega-3 Fatty Acids (FISH OIL) 1000 MG CAPS omega-3 acid ethyl esters 1 gram capsule  Take 2 capsules twice a day by oral route. (Patient not taking: Reported on 12/17/2019)     No facility-administered medications prior to visit.    Cardiac Studies:   ABI 04/01/2013: This exam reveals normal perfusion of both the lower extremities with bilateral ABI of 1.04.  Echocardiogram 08/26/2017: Left ventricle cavity is normal in size. Mild concentric hypertrophy of the left ventricle. Normal global wall motion. Normal diastolic filling pattern, normal LAP. Calculated EF 66%. Left atrial cavity is mild to moderately dilated. LA is much larger than the measured AP diameter 4.1 cm.  Right atrial cavity is mild to moderately dilated. Trace aortic regurgitation. Mild to moderate tricuspid regurgitation. Mild pulmonary hypertension. Estimated pulmonary artery systolic pressure 32  mmHg IVC is dilated with respiratory variation. Suggests elevated central venous pressure. Compared to the study done on 04/09/2012, biatrial enlargement and mild to moderate tricuspid regurgitation is new.  Lexiscan myoview stress test  08/18/2017:  1. Pharmacologic stress testing was performed with intravenous administration of .4 mg of Lexiscan over a 10-15 seconds infusion. Patient was hypertensive through out the study, max BP 160/88 mmHg. Stress symptoms included dyspnea, nausea. Exercise capacity not assessed. Stress EKG is non diagnostic for ischemia as it is a pharmacologic stress.  2. The overall quality of the study is good.  Left ventricular cavity is noted to be normal on the rest and stress studies.  Review of the raw data in a rotational cine format reveals breast attenuation, with imaging performed in sitting position. Gated SPECT images reveal normal myocardial thickening and wall motion.  The left ventricular ejection fraction was calculated or visually estimated to be 62%.  REST and STRESS images demonstrate medium sized area of moderately decreased tracer uptake in the basal inferoseptal, mid inferoseptal and apical inferior segments of the left ventricle. Defect improves on stress images. Defects are likely related to breast attenuation. Superimposed ischemia in this region cannot be excluded.  3. This is a low risk study.  EKG:  EKG 02/23/2020: Sinus bradycardia at rate of 59 bpm, left atrial enlargement, normal axis, LVH.  Nonspecific T abnormality.  No significant change from 06/09/2019.  Assessment   Palpitations - Plan: EKG 12-Lead  Dyspnea on exertion - Plan: PCV ECHOCARDIOGRAM COMPLETE  Muscle cramps at night  Pulsatile abdominal mass - Plan: PCV AORTA DUPLEX  Pure hypercholesterolemia - Plan: rosuvastatin (CRESTOR) 10 MG tablet, Lipid Panel With LDL/HDL Ratio   Medications Discontinued During This Encounter  Medication Reason  . nystatin (MYCOSTATIN/NYSTOP) powder Patient Preference  . Omega-3 Fatty Acids (FISH OIL) 1000 MG CAPS Patient Preference  . pravastatin (PRAVACHOL) 80 MG tablet Ineffective  Meds ordered this encounter  Medications  . rosuvastatin (CRESTOR) 10 MG tablet    Sig:  Take 1 tablet (10 mg total) by mouth daily.    Dispense:  30 tablet    Refill:  2    Discontinue Pravastatin   Recommendations:   Angelica KernsShirley A [name]  is a 74 y.o. female  with  diabetes mellitus, hypertension, hyperlipidemia, morbid obesity, nocturnal hypoxemia presently on oxygen supplementation, chronic dyspnea and palpitations. She also has GERD and hiatal hernia.   She presents here for annual visit, she underwent knee replacement October 2020, since then she has noticed decreased physical capacity and also worsening dyspnea on exertion and palpitations with exertion activity.  I suspect deconditioning to be the etiology of her dyspnea and palpitations.  Advised her to try to lose at least 10 pounds in weight over the next 2 to 3 months, advised her to increase her physical activity as tolerated to get symptoms of dyspnea and palpitations to improve her physical conditioning.  Do not suspect progression of coronary disease or new onset of coronary artery disease.  She has had a nuclear stress test in 2019 which was negative.  Unless her symptoms persist then we can consider further cardiac evaluation.  With regard to dyspnea on exertion, along with deconditioning, would like to exclude pulmonary hypertension in view of obesity and hypertension and age.  Will repeat echocardiogram.  Her leg cramps are probably related to diabetic peripheral neuropathy.  Do not suspect significant PAD although her left PT pulses absent.  She has a prominent abdominal aortic pulsation, will exclude AAA, obtain duplex of the abdomen for aortic aneurysm.  I reviewed her labs, lipids are not well controlled in view of diabetic state.  She has not been able to tolerate pravastatin due to itching, will discontinue this and will switch her to Crestor 10 mg daily, will recheck lipids in 2 months.  I would like to see her back in 3 months for follow-up.   40 minute encounter.    Yates DecampJay Exilda Wilhite, MD, [facility] IncFACC 02/23/2020, 11:54  AM Office: [phone]

## 2020-02-25 ENCOUNTER — Ambulatory Visit: Payer: Medicare HMO

## 2020-02-25 ENCOUNTER — Other Ambulatory Visit: Payer: Self-pay

## 2020-02-25 DIAGNOSIS — R0609 Other forms of dyspnea: Secondary | ICD-10-CM

## 2020-02-25 DIAGNOSIS — R0600 Dyspnea, unspecified: Secondary | ICD-10-CM

## 2020-03-02 ENCOUNTER — Encounter: Payer: Self-pay | Admitting: Internal Medicine

## 2020-03-03 ENCOUNTER — Ambulatory Visit: Payer: Medicare HMO | Admitting: Cardiology

## 2020-03-04 DIAGNOSIS — R0600 Dyspnea, unspecified: Secondary | ICD-10-CM | POA: Diagnosis not present

## 2020-03-04 DIAGNOSIS — R0602 Shortness of breath: Secondary | ICD-10-CM | POA: Diagnosis not present

## 2020-03-04 DIAGNOSIS — I10 Essential (primary) hypertension: Secondary | ICD-10-CM | POA: Diagnosis not present

## 2020-03-04 DIAGNOSIS — G4733 Obstructive sleep apnea (adult) (pediatric): Secondary | ICD-10-CM | POA: Diagnosis not present

## 2020-03-06 ENCOUNTER — Ambulatory Visit: Payer: Medicare HMO

## 2020-03-06 ENCOUNTER — Other Ambulatory Visit: Payer: Self-pay

## 2020-03-06 DIAGNOSIS — R1900 Intra-abdominal and pelvic swelling, mass and lump, unspecified site: Secondary | ICD-10-CM | POA: Diagnosis not present

## 2020-03-08 ENCOUNTER — Telehealth: Payer: Self-pay

## 2020-03-08 NOTE — Chronic Care Management (AMB) (Signed)
Chronic Care Management Pharmacy Assistant   Name: [name]  MRN: [id number] DOB: Feb 04, 1946  Reason for Encounter: Disease State / Diabetes and Hypertension  Patient Questions:  1.  Have you seen any other providers since your last visit? Yes , 7/08/221 - Bevelyn Ngo, BSW, CDP - Care Management, 02/23/2020- Dr Yates Decamp , Cardiologist.   2.  Any changes in your medicines or health? Yes, discontinue Pravastatin due to itching , started Crestor 10 mg daily per Dr Jacinto Halim Cardiologist on 02/23/2020.    PCP : Dorothyann Peng, MD  Allergies:   Allergies  Allergen Reactions  . Pravastatin Hives  . Haemophilus Influenzae Vaccines     Nausea, pain, body weakness  . Sulfa Antibiotics Itching  . Sulfonamide Derivatives Itching  . Tetanus Toxoids Swelling    Arm swelled at injection site    Medications: Outpatient Encounter Medications as of 03/08/2020  Medication Sig  . Ascorbic Acid (VITAMIN C) 1000 MG tablet Take 1,000 mg by mouth daily.  Marland Kitchen aspirin EC 81 MG tablet Take 1 tablet (81 mg total) by mouth 2 (two) times daily. (Patient taking differently: Take 81 mg by mouth every other day. )  . Bacillus Coagulans-Inulin (PROBIOTIC FORMULA PO) Take 30 mg by mouth daily.   . carvedilol (COREG) 3.125 MG tablet carvedilol 3.125 mg tablet  Take 1 tablet twice a day by oral route.  . cetirizine (ZYRTEC) 10 MG tablet Take 10 mg by mouth daily as needed for allergies.   . Cholecalciferol (VITAMIN D3) 50 MCG (2000 UT) capsule Take 1 capsule by mouth daily at 12 noon.   . Coenzyme Q10 (COQ10) 100 MG CAPS Take 100 mg by mouth daily with lunch.   . estradiol (ESTRACE) 0.1 MG/GM vaginal cream estradiol 0.01% (0.1 mg/gram) vaginal cream  USE A SMALL PEA SIZED AMOUNT ON VULVA DAILY FOR 2 WEEKS  . famotidine (PEPCID AC MAXIMUM STRENGTH) 20 MG tablet Take 20 mg by mouth 2 (two) times daily. As needed  . fluticasone (FLONASE) 50 MCG/ACT nasal spray Place 1 spray into both nostrils daily. (Patient  taking differently: Place 1 spray into both nostrils daily as needed for allergies. )  . furosemide (LASIX) 20 MG tablet Take 20 mg by mouth daily as needed (fluid).   . hydrochlorothiazide (MICROZIDE) 12.5 MG capsule Take 12.5 mg by mouth daily as needed (excess fluid/swelling).   Marland Kitchen losartan (COZAAR) 25 MG tablet Take 25 mg by mouth 2 (two) times a week. Take one tablet (25 mg) by mouth twice weekly - Tuesdays and Thursdays  . meloxicam (MOBIC) 15 MG tablet Take 15 mg by mouth daily as needed.   . metFORMIN (GLUCOPHAGE) 500 MG tablet Take 500 mg by mouth 2 (two) times daily with a meal.  . potassium chloride SA (K-DUR) 20 MEQ tablet Take 1 tablet (20 mEq total) by mouth daily as needed (with each dose of hydrochlorothiazide). (Patient taking differently: Take 20 mEq by mouth every morning. )  . rosuvastatin (CRESTOR) 10 MG tablet Take 1 tablet (10 mg total) by mouth daily.  Marland Kitchen triamcinolone cream (KENALOG) 0.1 % APPLY TO AFFECTED AREA TWICE DAILY AS NEEDED   No facility-administered encounter medications on file as of 03/08/2020.    Current Diagnosis: Patient Active Problem List   Diagnosis Date Noted  . S/P TKR (total knee replacement), right 04/19/2019  . Osteoarthritis of right knee 04/16/2019  . Obesity, diabetes, and hypertension syndrome (HCC) 06/08/2018  . Bradycardia 06/08/2018  . Chronic fatigue 06/08/2018  .  PVC (premature ventricular contraction) 08/03/2017  . Costochondritis 08/02/2017  . DOE (dyspnea on exertion) 08/01/2017  . Chest pain 05/28/2016  . Essential hypertension 05/28/2016  . Diabetes mellitus type 2 in obese (HCC) 05/28/2016  . UPPER RESPIRATORY INFECTION, VIRAL 03/22/2009  . ALLERGIC RHINITIS CAUSE UNSPECIFIED 11/04/2008  . Hyperlipidemia 04/11/2008  . COUGH 04/11/2008  . OBSTRUCTIVE SLEEP APNEA 03/21/2008  . CARPAL TUNNEL RELEASE, BILATERAL, HX OF 03/21/2008    Recent Relevant Labs: Lab Results  Component Value Date/Time   HGBA1C 5.9 (H) 12/09/2019  11:19 AM   HGBA1C 6.1 (H) 08/10/2019 10:50 AM   MICROALBUR 10 06/09/2019 04:57 PM    Kidney Function Lab Results  Component Value Date/Time   CREATININE 0.71 12/09/2019 11:19 AM   CREATININE 0.72 08/10/2019 10:50 AM   GFRNONAA 84 12/09/2019 11:19 AM   GFRAA 96 12/09/2019 11:19 AM    . Current antihyperglycemic regimen:  o Metformin 500 mg bid . What recent interventions/DTPs have been made to improve glycemic control:  o Patient has changed diet, baked foods, not eating at restaurants, eating at home more . Checking her blood sugars daily, sometimes two times a day. . Have there been any recent hospitalizations or ED visits since last visit with CPP? No . Patient denies hypoglycemic symptoms, including Pale, Sweaty, Shaky, Hungry, Nervous/irritable and Vision changes . Patient denies hyperglycemic symptoms, including blurry vision, excessive thirst, fatigue, polyuria and weakness . How often are you checking your blood sugar? twice daily . What are your blood sugars ranging?  o Fasting: 97 - 03/08/2020 o Before meals: none o After meals: none o Bedtime: 86 - 03/08/2020 . During the week, how often does your blood glucose drop below 70? Never . Are you checking your feet daily/regularly? Yes , daily.  Patient states that she has no sores,swelling, or  pain.  Adherence Review: Is the patient currently on a STATIN medication? Yes  Crestor 10 mg one tablet a day Is the patient currently on ACE/ARB medication? Yes Losartan 25 mg one tablet twice weekly , Monday and Thursday Does the patient have >5 day gap between last estimated fill dates? No   Reviewed chart prior to disease state call. Spoke with patient regarding BP  Recent Office Vitals: BP Readings from Last 3 Encounters:  02/23/20 126/71  12/09/19 110/76  10/07/19 140/80   Pulse Readings from Last 3 Encounters:  02/23/20 64  12/09/19 60  10/07/19 60    Wt Readings from Last 3 Encounters:  02/23/20 222 lb (100.7 kg)    12/09/19 218 lb 6.4 oz (99.1 kg)  10/07/19 218 lb 9.6 oz (99.2 kg)     Kidney Function Lab Results  Component Value Date/Time   CREATININE 0.71 12/09/2019 11:19 AM   CREATININE 0.72 08/10/2019 10:50 AM   GFRNONAA 84 12/09/2019 11:19 AM   GFRAA 96 12/09/2019 11:19 AM    BMP Latest Ref Rng & Units 12/09/2019 08/10/2019 04/20/2019  Glucose 65 - 99 mg/dL 791(T) 056(P) 794(I)  BUN 8 - 27 mg/dL 11 12 10   Creatinine 0.57 - 1.00 mg/dL 0.16 5.53  BUN/Creat Ratio 12 - 28 15 17  -  Sodium 134 - 144 mmol/L 143 145(H) 139  Potassium 3.5 - 5.2 mmol/L 4.8 4.2 4.2  Chloride 96 - 106 mmol/L 107(H) 107(H) 107  CO2 20 - 29 mmol/L 24 24 25   Calcium 8.7 - 10.3 mg/dL 9.2 9.3 7.48)    . Current antihypertensive regimen:  o Losartan 25 mg one tablet twice a week ,  Hydrochlorothiazide 12.5 mg one tablet a day, Carvedilol 3.125 one tablet twice a day. . How often are you checking your Blood Pressure? No blood pressure machine . Current home BP readings: Patient states that she has not been taking blood pressure at home, last time blood pressure reading was at Cardiologist office on 02/23/2020 -126/71.  . What recent interventions/DTPs have been made by any provider to improve Blood Pressure control since last CPP Visit:  Exercise, increase water intake.  . Any recent hospitalizations or ED visits since last visit with CPP? No . What diet changes have been made to improve Blood Pressure Control?  o Patient is eating healthier, baked food and patient is watching her salt intake.  . What exercise is being done to improve your Blood Pressure Control?  o Patient states that she loves the outdoors, and she spends a lot of time outside, states that she does as much as she can, patient had right knee replacement 03/2019, and states that her left knee is starting to cause her pain.  Patient has not been able to do aerobics, because gym is closed due to Covid.  Adherence Review: Is the patient currently on  ACE/ARB medication? Yes Losartan 25 mg twice a week Monday and Thursday Does the patient have >5 day gap between last estimated fill dates? No   Goals: Patient is meeting her goals with Diabetes management . Patient is not meeting her goals with Hypertension - not checking blood pressure , does not have machine yet.   Follow-Up:  Pharmacist Review -  Patient is in need of a blood pressure monitor .  Beryle Flock, CPP notified.  Jon Gills, [facility] Clinical Pharmacist Assistant [phone]

## 2020-03-16 ENCOUNTER — Ambulatory Visit: Payer: Medicare HMO | Admitting: Podiatry

## 2020-03-17 ENCOUNTER — Telehealth: Payer: Self-pay

## 2020-03-17 NOTE — Telephone Encounter (Signed)
I returned the pt's call to advise her to try xyzal at night and a nasal spray to help with her sinus issues and that she should go and get a covid test also to rule out covid.

## 2020-03-27 ENCOUNTER — Ambulatory Visit
Admission: EM | Admit: 2020-03-27 | Discharge: 2020-03-27 | Disposition: A | Discharge status: Home / Self Care | Payer: Medicare HMO | Attending: Emergency Medicine | Admitting: Emergency Medicine

## 2020-03-27 ENCOUNTER — Other Ambulatory Visit: Payer: Self-pay

## 2020-03-27 DIAGNOSIS — H60501 Unspecified acute noninfective otitis externa, right ear: Secondary | ICD-10-CM

## 2020-03-27 MED ORDER — NEOMYCIN-POLYMYXIN-HC 3.5-10000-1 OT SOLN: 3.0000 [drp] | Freq: Three times a day (TID) | OTIC | 0 refills | Status: DC

## 2020-03-27 NOTE — Discharge Instructions (Addendum)
Use eardrops as prescribed for the next week. Return for worsening ear pain, swelling, discharge, bleeding, decreased hearing, development of jaw pain/swelling, fever.  Do NOT use Q-tips as these can cause your ear wax to get stuck, the tips may break off and become a foreign body requiring additional medical care, or puncture your eardrum.  Helpful prevention tip: Use a solution of equal parts isopropyl (rubbing) alcohol and white vinegar (acetic acid) in both ears after swimming. 

## 2020-03-27 NOTE — ED Provider Notes (Signed)
[facility]    CSN: [id number] Arrival date & time: 03/27/20  1032      History   Chief Complaint Chief Complaint  Patient presents with  . Otalgia    x 1 week    HPI [name] is a 74 y.o. female  Presenting for right ear pain x1 week.  States it stings and burns occasionally.  Has noted some lymphadenopathy as well.  Denies sore throat, dental pain, fever, change in hearing, tinnitus, dizziness.  No discharge or trauma, travel.  Past Medical History:  Diagnosis Date  . Bladder infection   . Carpal tunnel syndrome   . Complication of anesthesia    Difficult to arouse  . Diabetes mellitus   . Hypertension   . Kidney stone   . Kidney stones   . Sleep apnea     Patient Active Problem List   Diagnosis Date Noted  . S/P TKR (total knee replacement), right 04/19/2019  . Osteoarthritis of right knee 04/16/2019  . Obesity, diabetes, and hypertension syndrome (HCC) 06/08/2018  . Bradycardia 06/08/2018  . Chronic fatigue 06/08/2018  . PVC (premature ventricular contraction) 08/03/2017  . Costochondritis 08/02/2017  . DOE (dyspnea on exertion) 08/01/2017  . Chest pain 05/28/2016  . Essential hypertension 05/28/2016  . Diabetes mellitus type 2 in obese (HCC) 05/28/2016  . UPPER RESPIRATORY INFECTION, VIRAL 03/22/2009  . ALLERGIC RHINITIS CAUSE UNSPECIFIED 11/04/2008  . Hyperlipidemia 04/11/2008  . COUGH 04/11/2008  . OBSTRUCTIVE SLEEP APNEA 03/21/2008  . CARPAL TUNNEL RELEASE, BILATERAL, HX OF 03/21/2008    Past Surgical History:  Procedure Laterality Date  . ABDOMINAL HYSTERECTOMY     partial  . APPENDECTOMY    . CARPAL TUNNEL RELEASE Bilateral 1980  . HERNIA REPAIR    . TONSILLECTOMY    . TOTAL KNEE ARTHROPLASTY Right 04/19/2019   Procedure: RIGHT TOTAL KNEE ARTHROPLASTY;  Surgeon: Gean Birchwood, MD;  Location: [facility];  Service: Orthopedics;  Laterality: Right;    OB History   No obstetric history on file.      Home Medications     Prior to Admission medications   Medication Sig Start Date End Date Taking? Authorizing Provider  aspirin EC 81 MG tablet Take 1 tablet (81 mg total) by mouth 2 (two) times daily. Patient taking differently: Take 81 mg by mouth every other day.  04/19/19  Yes Dannielle Burn K, PA-C  Bacillus Coagulans-Inulin (PROBIOTIC FORMULA PO) Take 30 mg by mouth daily.    Yes [provider]  carvedilol (COREG) 3.125 MG tablet carvedilol 3.125 mg tablet  Take 1 tablet twice a day by oral route.   Yes [provider]  cetirizine (ZYRTEC) 10 MG tablet Take 10 mg by mouth daily as needed for allergies.    Yes [provider]  Cholecalciferol (VITAMIN D3) 50 MCG (2000 UT) capsule Take 1 capsule by mouth daily at 12 noon.    Yes [provider]  Coenzyme Q10 (COQ10) 100 MG CAPS Take 100 mg by mouth daily with lunch.    Yes [provider]  estradiol (ESTRACE) 0.1 MG/GM vaginal cream estradiol 0.01% (0.1 mg/gram) vaginal cream  USE A SMALL PEA SIZED AMOUNT ON VULVA DAILY FOR 2 WEEKS   Yes [provider]  furosemide (LASIX) 20 MG tablet Take 20 mg by mouth daily as needed (fluid).    Yes [provider]  hydrochlorothiazide (MICROZIDE) 12.5 MG capsule Take 12.5 mg by mouth daily as needed (excess fluid/swelling).  Yes [provider]  losartan (COZAAR) 25 MG tablet Take 25 mg by mouth 2 (two) times a week. Take one tablet (25 mg) by mouth twice weekly - Tuesdays and Thursdays   Yes [provider]  meloxicam (MOBIC) 15 MG tablet Take 15 mg by mouth daily as needed.  06/26/19  Yes [provider]  metFORMIN (GLUCOPHAGE) 500 MG tablet Take 500 mg by mouth 2 (two) times daily with a meal.   Yes [provider]  potassium chloride SA (K-DUR) 20 MEQ tablet Take 1 tablet (20 mEq total) by mouth daily as needed (with each dose of hydrochlorothiazide). Patient taking differently: Take 20 mEq by mouth every morning.  12/31/18   Yes Dorothyann Peng, MD  rosuvastatin (CRESTOR) 10 MG tablet Take 1 tablet (10 mg total) by mouth daily. 02/23/20 05/23/20 Yes Yates Decamp, MD  triamcinolone cream (KENALOG) 0.1 % APPLY TO AFFECTED AREA TWICE DAILY AS NEEDED 10/07/19  Yes Dorothyann Peng, MD  Ascorbic Acid (VITAMIN C) 1000 MG tablet Take 1,000 mg by mouth daily.    [provider]  famotidine (PEPCID AC MAXIMUM STRENGTH) 20 MG tablet Take 20 mg by mouth 2 (two) times daily. As needed    [provider]  fluticasone (FLONASE) 50 MCG/ACT nasal spray Place 1 spray into both nostrils daily. Patient taking differently: Place 1 spray into both nostrils daily as needed for allergies.  09/02/18 02/23/20  Dorothyann Peng, MD  neomycin-polymyxin-hydrocortisone (CORTISPORIN) OTIC solution Place 3 drops into the right ear 3 (three) times daily. 03/27/20   Hall-Potvin, [place], PA-C    Family History Family History  Problem Relation Age of Onset  . Heart attack Father 62  . Stroke Father 46  . Diabetes Father   . Diabetes Brother   . Diabetes Brother   . Breast cancer Sister 27  . Diabetes Mother   . Hypertension Mother     Social History Social History   Tobacco Use  . Smoking status: Never Smoker  . Smokeless tobacco: Never Used  Vaping Use  . Vaping Use: Never used  Substance Use Topics  . Alcohol use: No  . Drug use: No     Allergies   Pravastatin, Haemophilus influenzae vaccines, Sulfa antibiotics, Sulfonamide derivatives, and Tetanus toxoids   Review of Systems As per HPI   Physical Exam Triage Vital Signs ED Triage Vitals  Enc Vitals Group     BP      Pulse      Resp      Temp      Temp src      SpO2      Weight      Height      Head Circumference      Peak Flow      Pain Score      Pain Loc      Pain Edu?      Excl. in GC?    No data found.  Updated Vital Signs BP (!) 147/78 (BP Location: Right Arm)   Pulse (!) 59   Temp 98.2 F (36.8 C) (Oral)   Resp 18   SpO2 97%   Visual  Acuity Right Eye Distance:   Left Eye Distance:   Bilateral Distance:    Right Eye Near:   Left Eye Near:    Bilateral Near:     Physical Exam Constitutional:      General: She is not in acute distress. HENT:     Head: Normocephalic and  atraumatic.     Right Ear: Tympanic membrane normal.     Left Ear: Tympanic membrane, ear canal and external ear normal.     Ears:     Comments: Right ear with tragal tenderness.  Mild EAC swelling and erythema.  No discharge, foreign body. Eyes:     General: No scleral icterus.    Pupils: Pupils are equal, round, and reactive to light.  Cardiovascular:     Rate and Rhythm: Normal rate.  Pulmonary:     Effort: Pulmonary effort is normal.  Skin:    Coloration: Skin is not jaundiced or pale.  Neurological:     Mental Status: She is alert and oriented to person, place, and time.      UC Treatments / Results  Labs (all labs ordered are listed, but only abnormal results are displayed) Labs Reviewed - No data to display  EKG   Radiology No results found.  Procedures Procedures (including critical care time)  Medications Ordered in UC Medications - No data to display  Initial Impression / Assessment and Plan / UC Course  I have reviewed the triage vital signs and the nursing notes.  Pertinent labs & imaging results that were available during my care of the patient were reviewed by me and considered in my medical decision making (see chart for details).     We will treat for AOE as outlined below.  Return precautions discussed, pt verbalized understanding and is agreeable to plan. Final Clinical Impressions(s) / UC Diagnoses   Final diagnoses:  Acute otitis externa of right ear, unspecified type     Discharge Instructions     Use eardrops as prescribed for the next week. Return for worsening ear pain, swelling, discharge, bleeding, decreased hearing, development of jaw pain/swelling, fever.  Do NOT use Q-tips as these can  cause your ear wax to get stuck, the tips may break off and become a foreign body requiring additional medical care, or puncture your eardrum.  Helpful prevention tip: Use a solution of equal parts isopropyl (rubbing) alcohol and white vinegar (acetic acid) in both ears after swimming.    ED Prescriptions    Medication Sig Dispense Auth. Provider   neomycin-polymyxin-hydrocortisone (CORTISPORIN) OTIC solution Place 3 drops into the right ear 3 (three) times daily. 10 mL Hall-Potvin, [place], PA-C     PDMP not reviewed this encounter.   Hall-Potvin, [place], [place] 03/27/20 1213

## 2020-03-27 NOTE — ED Triage Notes (Signed)
Pt states she is having right ear pain x 1 week that is stinging and burning. Pt states she has had a cough x 1 year and ear pain may be related. Pt is aox4 and ambulatory.

## 2020-04-03 DIAGNOSIS — H6983 Other specified disorders of Eustachian tube, bilateral: Secondary | ICD-10-CM | POA: Diagnosis not present

## 2020-04-03 DIAGNOSIS — J343 Hypertrophy of nasal turbinates: Secondary | ICD-10-CM | POA: Diagnosis not present

## 2020-04-03 DIAGNOSIS — H6240 Otitis externa in other diseases classified elsewhere, unspecified ear: Secondary | ICD-10-CM | POA: Diagnosis not present

## 2020-04-03 DIAGNOSIS — I10 Essential (primary) hypertension: Secondary | ICD-10-CM | POA: Diagnosis not present

## 2020-04-03 DIAGNOSIS — G4733 Obstructive sleep apnea (adult) (pediatric): Secondary | ICD-10-CM | POA: Diagnosis not present

## 2020-04-03 DIAGNOSIS — R0600 Dyspnea, unspecified: Secondary | ICD-10-CM | POA: Diagnosis not present

## 2020-04-03 DIAGNOSIS — R0602 Shortness of breath: Secondary | ICD-10-CM | POA: Diagnosis not present

## 2020-04-03 DIAGNOSIS — H6993 Unspecified Eustachian tube disorder, bilateral: Secondary | ICD-10-CM | POA: Insufficient documentation

## 2020-04-03 DIAGNOSIS — J302 Other seasonal allergic rhinitis: Secondary | ICD-10-CM | POA: Diagnosis not present

## 2020-04-03 DIAGNOSIS — B369 Superficial mycosis, unspecified: Secondary | ICD-10-CM | POA: Insufficient documentation

## 2020-04-07 ENCOUNTER — Ambulatory Visit: Payer: Medicare HMO | Admitting: Podiatry

## 2020-04-07 DIAGNOSIS — L259 Unspecified contact dermatitis, unspecified cause: Secondary | ICD-10-CM | POA: Insufficient documentation

## 2020-04-07 DIAGNOSIS — B369 Superficial mycosis, unspecified: Secondary | ICD-10-CM | POA: Diagnosis not present

## 2020-04-07 DIAGNOSIS — L239 Allergic contact dermatitis, unspecified cause: Secondary | ICD-10-CM | POA: Diagnosis not present

## 2020-04-07 DIAGNOSIS — H6240 Otitis externa in other diseases classified elsewhere, unspecified ear: Secondary | ICD-10-CM | POA: Diagnosis not present

## 2020-04-10 ENCOUNTER — Ambulatory Visit (INDEPENDENT_AMBULATORY_CARE_PROVIDER_SITE_OTHER): Payer: Medicare HMO | Admitting: Internal Medicine

## 2020-04-10 ENCOUNTER — Encounter: Payer: Self-pay | Admitting: Internal Medicine

## 2020-04-10 ENCOUNTER — Other Ambulatory Visit: Payer: Self-pay

## 2020-04-10 VITALS — BP 124/78 | HR 53 | Temp 98.2°F | Ht 62.6 in | Wt 215.6 lb

## 2020-04-10 DIAGNOSIS — I10 Essential (primary) hypertension: Secondary | ICD-10-CM | POA: Diagnosis not present

## 2020-04-10 DIAGNOSIS — E7800 Pure hypercholesterolemia, unspecified: Secondary | ICD-10-CM | POA: Diagnosis not present

## 2020-04-10 DIAGNOSIS — Z79899 Other long term (current) drug therapy: Secondary | ICD-10-CM

## 2020-04-10 DIAGNOSIS — Z6838 Body mass index (BMI) 38.0-38.9, adult: Secondary | ICD-10-CM | POA: Diagnosis not present

## 2020-04-10 DIAGNOSIS — E1165 Type 2 diabetes mellitus with hyperglycemia: Secondary | ICD-10-CM

## 2020-04-10 NOTE — Patient Instructions (Signed)
Diabetes Mellitus and Foot Care Foot care is an important part of your health, especially when you have diabetes. Diabetes may cause you to have problems because of poor blood flow (circulation) to your feet and legs, which can cause your skin to:  Become thinner and drier.  Break more easily.  Heal more slowly.  Peel and crack. You may also have nerve damage (neuropathy) in your legs and feet, causing decreased feeling in them. This means that you may not notice minor injuries to your feet that could lead to more serious problems. Noticing and addressing any potential problems early is the best way to prevent future foot problems. How to care for your feet Foot hygiene  Wash your feet daily with warm water and mild soap. Do not use hot water. Then, pat your feet and the areas between your toes until they are completely dry. Do not soak your feet as this can dry your skin.  Trim your toenails straight across. Do not dig under them or around the cuticle. File the edges of your nails with an emery board or nail file.  Apply a moisturizing lotion or petroleum jelly to the skin on your feet and to dry, brittle toenails. Use lotion that does not contain alcohol and is unscented. Do not apply lotion between your toes. Shoes and socks  Wear clean socks or stockings every day. Make sure they are not too tight. Do not wear knee-high stockings since they may decrease blood flow to your legs.  Wear shoes that fit properly and have enough cushioning. Always look in your shoes before you put them on to be sure there are no objects inside.  To break in new shoes, wear them for just a few hours a day. This prevents injuries on your feet. Wounds, scrapes, corns, and calluses  Check your feet daily for blisters, cuts, bruises, sores, and redness. If you cannot see the bottom of your feet, use a mirror or ask someone for help.  Do not cut corns or calluses or try to remove them with medicine.  If you  find a minor scrape, cut, or break in the skin on your feet, keep it and the skin around it clean and dry. You may clean these areas with mild soap and water. Do not clean the area with peroxide, alcohol, or iodine.  If you have a wound, scrape, corn, or callus on your foot, look at it several times a day to make sure it is healing and not infected. Check for: ? Redness, swelling, or pain. ? Fluid or blood. ? Warmth. ? Pus or a bad smell. General instructions  Do not cross your legs. This may decrease blood flow to your feet.  Do not use heating pads or hot water bottles on your feet. They may burn your skin. If you have lost feeling in your feet or legs, you may not know this is happening until it is too late.  Protect your feet from hot and cold by wearing shoes, such as at the beach or on hot pavement.  Schedule a complete foot exam at least once a year (annually) or more often if you have foot problems. If you have foot problems, report any cuts, sores, or bruises to your health care provider immediately. Contact a health care provider if:  You have a medical condition that increases your risk of infection and you have any cuts, sores, or bruises on your feet.  You have an injury that is not   healing.  You have redness on your legs or feet.  You feel burning or tingling in your legs or feet.  You have pain or cramps in your legs and feet.  Your legs or feet are numb.  Your feet always feel cold.  You have pain around a toenail. Get help right away if:  You have a wound, scrape, corn, or callus on your foot and: ? You have pain, swelling, or redness that gets worse. ? You have fluid or blood coming from the wound, scrape, corn, or callus. ? Your wound, scrape, corn, or callus feels warm to the touch. ? You have pus or a bad smell coming from the wound, scrape, corn, or callus. ? You have a fever. ? You have a red line going up your leg. Summary  Check your feet every day  for cuts, sores, red spots, swelling, and blisters.  Moisturize feet and legs daily.  Wear shoes that fit properly and have enough cushioning.  If you have foot problems, report any cuts, sores, or bruises to your health care provider immediately.  Schedule a complete foot exam at least once a year (annually) or more often if you have foot problems. This information is not intended to replace advice given to you by your health care provider. Make sure you discuss any questions you have with your health care provider. Document Revised: 03/03/2019 Document Reviewed: 07/12/2016 Elsevier Patient Education  2020 [employer]  

## 2020-04-10 NOTE — Progress Notes (Signed)
I,Katawbba Wiggins,acting as a Education administrator for [name], MD.,have documented all relevant documentation on the behalf of [name], MD,as directed by  [name], MD while in the presence of [name], MD.  This visit occurred during the SARS-CoV-2 public health emergency.  Safety protocols were in place, including screening questions prior to the visit, additional usage of staff PPE, and extensive cleaning of exam room while observing appropriate contact time as indicated for disinfecting solutions.  Subjective:     Patient ID: [name] , female    DOB: 01/22/1946 , 74 y.o.   MRN: [id number]   Chief Complaint  Patient presents with  . Diabetes  . Hypertension  . Hyperlipidemia    HPI  She presents today for diabetes, blood pressure, and cholesterol follow-up. She was started on rosuvastatin 55m daily by Dr. GEinar Gip her cardiologist Sept 2021. She denies having any issues with the medication.   Diabetes She presents for her follow-up diabetic visit. She has type 2 diabetes mellitus. Her disease course has been stable. There are no hypoglycemic associated symptoms. Pertinent negatives for diabetes include no blurred vision and no chest pain. There are no hypoglycemic complications. Risk factors for coronary artery disease include dyslipidemia, diabetes mellitus, hypertension, obesity, post-menopausal and sedentary lifestyle. She is following a generally healthy diet. Her home blood glucose trend is fluctuating minimally. Her breakfast blood glucose is taken between 8-9 am. Her breakfast blood glucose range is generally 110-130 mg/dl. An ACE inhibitor/angiotensin II receptor blocker is being taken. Eye exam is current.  Hypertension This is a chronic problem. The current episode started more than 1 year ago. The problem has been gradually improving since onset. The problem is controlled. Pertinent negatives include no blurred vision, chest pain, palpitations or  shortness of breath. Risk factors for coronary artery disease include diabetes mellitus, dyslipidemia, obesity, post-menopausal state and sedentary lifestyle. Past treatments include angiotensin blockers and diuretics.  Hyperlipidemia This is a chronic problem. The current episode started more than 1 year ago. The problem is uncontrolled. Exacerbating diseases include diabetes and obesity. Pertinent negatives include no chest pain or shortness of breath. Current antihyperlipidemic treatment includes statins. Compliance problems include adherence to exercise.  Risk factors for coronary artery disease include diabetes mellitus, dyslipidemia, obesity, post-menopausal and a sedentary lifestyle.     Past Medical History:  Diagnosis Date  . Bladder infection   . Carpal tunnel syndrome   . Complication of anesthesia    Difficult to arouse  . Diabetes mellitus   . Hypertension   . Kidney stone   . Kidney stones   . Sleep apnea      Family History  Problem Relation Age of Onset  . Heart attack Father 434 . Stroke Father 721 . Diabetes Father   . Diabetes Brother   . Diabetes Brother   . Breast cancer Sister 550 . Diabetes Mother   . Hypertension Mother      Current Outpatient Medications:  .  acetic acid-hydrocortisone (VOSOL-HC) OTIC solution, 4 drops in the right ear nightly for 2 weeks then as needed for itching., Disp: , Rfl:  .  Ascorbic Acid (VITAMIN C) 1000 MG tablet, Take 1,000 mg by mouth daily., Disp: , Rfl:  .  aspirin EC 81 MG tablet, Take 1 tablet (81 mg total) by mouth 2 (two) times daily. (Patient taking differently: Take 81 mg by mouth every other day. ), Disp: 60 tablet, Rfl: 0 .  Bacillus Coagulans-Inulin (  PROBIOTIC FORMULA PO), Take 30 mg by mouth daily. , Disp: , Rfl:  .  carvedilol (COREG) 3.125 MG tablet, carvedilol 3.125 mg tablet  Take 1 tablet twice a day by oral route., Disp: , Rfl:  .  cetirizine (ZYRTEC) 10 MG tablet, Take 10 mg by mouth daily as needed for  allergies. , Disp: , Rfl:  .  Cholecalciferol (VITAMIN D3) 50 MCG (2000 UT) capsule, Take 1 capsule by mouth daily at 12 noon. , Disp: , Rfl:  .  clotrimazole-betamethasone (LOTRISONE) cream, Apply topically., Disp: , Rfl:  .  Coenzyme Q10 (COQ10) 100 MG CAPS, Take 100 mg by mouth daily with lunch. , Disp: , Rfl:  .  estradiol (ESTRACE) 0.1 MG/GM vaginal cream, estradiol 0.01% (0.1 mg/gram) vaginal cream  USE A SMALL PEA SIZED AMOUNT ON VULVA DAILY FOR 2 WEEKS, Disp: , Rfl:  .  famotidine (PEPCID AC MAXIMUM STRENGTH) 20 MG tablet, Take 20 mg by mouth 2 (two) times daily. As needed, Disp: , Rfl:  .  furosemide (LASIX) 20 MG tablet, Take 20 mg by mouth daily as needed (fluid). , Disp: , Rfl:  .  hydrochlorothiazide (MICROZIDE) 12.5 MG capsule, Take 12.5 mg by mouth daily as needed (excess fluid/swelling). , Disp: , Rfl:  .  losartan (COZAAR) 25 MG tablet, Take 25 mg by mouth 2 (two) times a week. Take one tablet (25 mg) by mouth twice weekly - Tuesdays and Thursdays, Disp: , Rfl:  .  meloxicam (MOBIC) 15 MG tablet, Take 15 mg by mouth daily as needed. , Disp: , Rfl:  .  metFORMIN (GLUCOPHAGE) 500 MG tablet, Take 500 mg by mouth 2 (two) times daily with a meal., Disp: , Rfl:  .  neomycin-polymyxin-hydrocortisone (CORTISPORIN) OTIC solution, Place 3 drops into the right ear 3 (three) times daily., Disp: 10 mL, Rfl: 0 .  potassium chloride SA (K-DUR) 20 MEQ tablet, Take 1 tablet (20 mEq total) by mouth daily as needed (with each dose of hydrochlorothiazide). (Patient taking differently: Take 20 mEq by mouth every morning. ), Disp: 90 tablet, Rfl: 2 .  rosuvastatin (CRESTOR) 10 MG tablet, Take 1 tablet (10 mg total) by mouth daily., Disp: 30 tablet, Rfl: 2 .  triamcinolone cream (KENALOG) 0.1 %, APPLY TO AFFECTED AREA TWICE DAILY AS NEEDED, Disp: 45 g, Rfl: 0 .  fluticasone (FLONASE) 50 MCG/ACT nasal spray, Place 1 spray into both nostrils daily. (Patient taking differently: Place 1 spray into both nostrils  daily as needed for allergies. ), Disp: 16 g, Rfl: 2   Allergies  Allergen Reactions  . Pravastatin Hives  . Haemophilus Influenzae Vaccines     Nausea, pain, body weakness  . Sulfa Antibiotics Itching  . Sulfonamide Derivatives Itching  . Tetanus Toxoids Swelling    Arm swelled at injection site     Review of Systems  Constitutional: Negative.   Eyes: Negative for blurred vision.  Respiratory: Negative.  Negative for shortness of breath.   Cardiovascular: Negative.  Negative for chest pain and palpitations.  Gastrointestinal: Negative.   Psychiatric/Behavioral: Negative.   All other systems reviewed and are negative.    Today's Vitals   04/10/20 1101  BP: 124/78  Pulse: (!) 53  Temp: 98.2 F (36.8 C)  TempSrc: Oral  Weight: 215 lb 9.6 oz (97.8 kg)  Height: 5' 2.6" (1.59 m)  PainSc: 6   PainLoc: Knee   Body mass index is 38.68 kg/m.  Wt Readings from Last 3 Encounters:  04/10/20 215 lb 9.6 oz (  97.8 kg)  02/23/20 222 lb (100.7 kg)  12/09/19 218 lb 6.4 oz (99.1 kg)   Objective:  Physical Exam Vitals and nursing note reviewed.  Constitutional:      Appearance: Normal appearance. She is obese.  HENT:     Head: Normocephalic and atraumatic.  Cardiovascular:     Rate and Rhythm: Normal rate and regular rhythm.     Heart sounds: Normal heart sounds.  Pulmonary:     Breath sounds: Normal breath sounds.  Skin:    General: Skin is warm.  Neurological:     General: No focal deficit present.     Mental Status: She is alert and oriented to person, place, and time.         Assessment And Plan:     1. Uncontrolled type 2 diabetes mellitus with hyperglycemia (HCC) Comments: Chronic, I will check labs as listed below. She will continue wtih current meds for now. She will rto in Jan 2022 for her next physical exam.  - BMP8+EGFR - Hemoglobin A1c  2. Essential hypertension, benign Comments: Chronic, well controlled. She will continue with current meds.   3. Pure  hypercholesterolemia Comments: Chronic. I will check repeat lipid panel today and forward labs to Dr. Einar Gip for his review.  - Lipid panel  4. Class 2 severe obesity due to excess calories with serious comorbidity and body mass index (BMI) of 38.0 to 38.9 in adult [place]) Comments: Chronic, encouraged to strive for BMI less than 30 to decrease cardiac risk. Advised to aim for at least 150 minutes per week of exercise.   5. Drug therapy - ALT    Patient was given opportunity to ask questions. Patient verbalized understanding of the plan and was able to repeat key elements of the plan. All questions were answered to their satisfaction.  [name], MD   I, [name], MD, have reviewed all documentation for this visit. The documentation on 04/10/20 for the exam, diagnosis, procedures, and orders are all accurate and complete.  THE PATIENT IS ENCOURAGED TO PRACTICE SOCIAL DISTANCING DUE TO THE COVID-19 PANDEMIC.

## 2020-04-11 ENCOUNTER — Telehealth: Payer: Self-pay

## 2020-04-11 ENCOUNTER — Telehealth: Payer: Medicare HMO

## 2020-04-11 LAB — BMP8+EGFR
BUN/Creatinine Ratio: 16 (ref 12–28)
BUN: 12 mg/dL (ref 8–27)
CO2: 26 mmol/L (ref 20–29)
Calcium: 9.5 mg/dL (ref 8.7–10.3)
Chloride: 105 mmol/L (ref 96–106)
Creatinine, Ser: 0.75 mg/dL (ref 0.57–1.00)
GFR calc Af Amer: 90 mL/min/{1.73_m2} (ref >59)
GFR calc non Af Amer: 78 mL/min/{1.73_m2} (ref >59)
Glucose: 104 mg/dL — ABNORMAL HIGH (ref 65–99)
Potassium: 4.3 mmol/L (ref 3.5–5.2)
Sodium: 144 mmol/L (ref 134–144)

## 2020-04-11 LAB — LIPID PANEL
Chol/HDL Ratio: 2.5 ratio (ref 0.0–4.4)
Cholesterol, Total: 187 mg/dL (ref 100–199)
HDL: 76 mg/dL (ref >39)
LDL Chol Calc (NIH): 101 mg/dL — ABNORMAL HIGH (ref 0–99)
Triglycerides: 51 mg/dL (ref 0–149)
VLDL Cholesterol Cal: 10 mg/dL (ref 5–40)

## 2020-04-11 LAB — HEMOGLOBIN A1C
Est. average glucose Bld gHb Est-mCnc: 128 mg/dL
Hgb A1c MFr Bld: 6.1 % — ABNORMAL HIGH (ref 4.8–5.6)

## 2020-04-11 LAB — ALT: ALT: 10 IU/L (ref 0–32)

## 2020-04-11 NOTE — Telephone Encounter (Cosign Needed)
  Chronic Care Management   Outreach Note  04/11/2020 Name: [name] MRN: [id number] DOB: [birth year]  Referred by: Dorothyann Peng, MD Reason for referral : Chronic Care Management (#3 Initial RNCM Call )   Third unsuccessful telephone outreach was attempted today. The patient was referred to the case management team for assistance with care management and care coordination. The patient's primary care provider has been notified of our unsuccessful attempts to make or maintain contact with the patient. The care management team is pleased to engage with this patient at any time in the future should he/she be interested in assistance from the care management team.   Follow Up Plan: We have been unable to make contact with the patient for follow up. The care management team is available to follow up with the patient after provider conversation with the patient regarding recommendation for care management engagement and subsequent re-referral to the care management team.   Delsa Sale, RN, BSN, CCM Care Management Coordinator [facility]  Direct Phone: [phone]

## 2020-04-13 DIAGNOSIS — R6881 Early satiety: Secondary | ICD-10-CM | POA: Diagnosis not present

## 2020-04-13 DIAGNOSIS — Z1211 Encounter for screening for malignant neoplasm of colon: Secondary | ICD-10-CM | POA: Diagnosis not present

## 2020-04-13 DIAGNOSIS — K219 Gastro-esophageal reflux disease without esophagitis: Secondary | ICD-10-CM | POA: Diagnosis not present

## 2020-04-18 ENCOUNTER — Telehealth: Payer: Self-pay

## 2020-04-18 NOTE — Chronic Care Management (AMB) (Deleted)
Chronic Care Management Pharmacy  Name: [name]  MRN: [id number] DOB: 23-Dec-1945  Chief Complaint/ HPI  [name],  74 y.o. , female presents for their Follow-Up CCM visit with the clinical pharmacist via telephone due to COVID-19 Pandemic.  PCP : Glendale Chard, MD  Medications: Outpatient Encounter Medications as of 04/18/2020  Medication Sig  . acetic acid-hydrocortisone (VOSOL-HC) OTIC solution 4 drops in the right ear nightly for 2 weeks then as needed for itching.  . Ascorbic Acid (VITAMIN C) 1000 MG tablet Take 1,000 mg by mouth daily.  Marland Kitchen aspirin EC 81 MG tablet Take 1 tablet (81 mg total) by mouth 2 (two) times daily. (Patient taking differently: Take 81 mg by mouth every other day. )  . Bacillus Coagulans-Inulin (PROBIOTIC FORMULA PO) Take 30 mg by mouth daily.   . carvedilol (COREG) 3.125 MG tablet carvedilol 3.125 mg tablet  Take 1 tablet twice a day by oral route.  . cetirizine (ZYRTEC) 10 MG tablet Take 10 mg by mouth daily as needed for allergies.   . Cholecalciferol (VITAMIN D3) 50 MCG (2000 UT) capsule Take 1 capsule by mouth daily at 12 noon.   . clotrimazole-betamethasone (LOTRISONE) cream Apply topically.  . Coenzyme Q10 (COQ10) 100 MG CAPS Take 100 mg by mouth daily with lunch.   . estradiol (ESTRACE) 0.1 MG/GM vaginal cream estradiol 0.01% (0.1 mg/gram) vaginal cream  USE A SMALL PEA SIZED AMOUNT ON VULVA DAILY FOR 2 WEEKS  . famotidine (PEPCID AC MAXIMUM STRENGTH) 20 MG tablet Take 20 mg by mouth 2 (two) times daily. As needed  . fluticasone (FLONASE) 50 MCG/ACT nasal spray Place 1 spray into both nostrils daily. (Patient taking differently: Place 1 spray into both nostrils daily as needed for allergies. )  . furosemide (LASIX) 20 MG tablet Take 20 mg by mouth daily as needed (fluid).   . hydrochlorothiazide (MICROZIDE) 12.5 MG capsule Take 12.5 mg by mouth daily as needed (excess fluid/swelling).   Marland Kitchen losartan (COZAAR) 25 MG tablet Take 25 mg by  mouth 2 (two) times a week. Take one tablet (25 mg) by mouth twice weekly - Tuesdays and Thursdays  . meloxicam (MOBIC) 15 MG tablet Take 15 mg by mouth daily as needed.   . metFORMIN (GLUCOPHAGE) 500 MG tablet Take 500 mg by mouth 2 (two) times daily with a meal.  . neomycin-polymyxin-hydrocortisone (CORTISPORIN) OTIC solution Place 3 drops into the right ear 3 (three) times daily.  . potassium chloride SA (K-DUR) 20 MEQ tablet Take 1 tablet (20 mEq total) by mouth daily as needed (with each dose of hydrochlorothiazide). (Patient taking differently: Take 20 mEq by mouth every morning. )  . rosuvastatin (CRESTOR) 10 MG tablet Take 1 tablet (10 mg total) by mouth daily.  Marland Kitchen triamcinolone cream (KENALOG) 0.1 % APPLY TO AFFECTED AREA TWICE DAILY AS NEEDED   No facility-administered encounter medications on file as of 04/18/2020.    Current Diagnosis/Assessment:   Goals Addressed   None     Diabetes   Recent Relevant Labs: Lab Results  Component Value Date/Time   HGBA1C 6.1 (H) 04/10/2020 11:49 AM   HGBA1C 5.9 (H) 12/09/2019 11:19 AM   MICROALBUR 10 06/09/2019 04:57 PM    Checking BG:   Recent BG: Patient has failed these meds in past: N/A Patient is currently controlled on the following medications:   Metformin 573m twice daily with a meal  Aspirin 829mevery other day  Last diabetic Foot exam: 06/09/19 Last diabetic Eye  exam: Lab Results  Component Value Date/Time   HMDIABEYEEXA No Retinopathy 02/18/2019 12:00 AM    We discussed:  Diet extensively  Doesn't eat early (breakfast around 11AM-12PM): grits, eggs, bacon, toast, cereal  Tuna salad, vegetables, not much meat (sometimes chicken or salmon); sometimes fried fish (twice a month)  5-16 oz glasses of water daily  Diluted sweet tea, lemonade, minimal sodas  Occasionally metformin makes her stomach feel queasy but not often  Exercise extensively  Chair exercises every once in a while  Works in the  yard  Knee/hip prevents her from doing as much as she would like (one leg is longer than the other and causes some irritation)  Starting physical therapy next week  Recommend water aerobics  Aspirin dose decreased to every other day due to bruising  Plan Continue current medications    Hypertension   Office blood pressures are  BP Readings from Last 3 Encounters:  04/10/20 124/78  03/27/20 (!) 147/78  02/23/20 126/71   Patient has failed these meds in the past: Atenolol Patient is currently controlled on the following medications:   Furosemide 44m daily as needed  Hydrochlorothiazide 12.580mdaily as needed  Losartan 2533mwice weekly  Potassium 49m49maily as needed with HCTZ  Patient checks BP at home Never, does not have BP monitor at home  Patient home BP readings are ranging: None to provide  We discussed: . Diet and exercise extensively  Tries to avoid salt  Pt mentioned intermittent hand tingling/itching and asked if it could be from her medications  Told pt that it could be from HCTZ or furosemide, but that is not a common side effect  Plan Continue current medications  Discuss pt obtaining BP monitor through insurance or DME   Hyperlipidemia   LDL goal < 70  Lipid Panel     Component Value Date/Time   CHOL 187 04/10/2020 1149   TRIG 51 04/10/2020 1149   HDL 76 04/10/2020 1149   LDLCALC 101 (H) 04/10/2020 1149    Hepatic Function Latest Ref Rng & Units 04/10/2020 08/10/2019 12/31/2018  Total Protein 6.0 - 8.5 g/dL - 6.6 6.4  Albumin 3.7 - 4.7 g/dL - 3.9 3.9  AST 0 - 40 IU/L - 12 14  ALT 0 - 32 IU/L '10 7 10  ' Alk Phosphatase 39 - 117 IU/L - 103 94  Total Bilirubin 0.0 - 1.2 mg/dL - 0.3 <0.2  Bilirubin, Direct <=0.2 mg/dL - - -     The 10-year ASCVD risk score (GofMikey BussingJr., et al., 2013) is: 33.3%   Values used to calculate the score:     Age: 61 y35rs     Sex: Female     Is Non-Hispanic African American: Yes     Diabetic: Yes     Tobacco  smoker: No     Systolic Blood Pressure: 124 283g     Is BP treated: Yes     HDL Cholesterol: 76 mg/dL     Total Cholesterol: 187 mg/dL   Patient has failed these meds in past: [place] Patient is currently uncontrolled on the following medications:  . Rosuvastatin 10mg64mly . CoQ10 100mg 55my  We discussed: . Diet and exercise extensively . Pt states that she does not remember being on cholesterol medications prior to taking pravastatin 80mg t53m daily . Denies any side effects from pravastatin  Plan Continue current medications    Medication Management   [facility] Uses pill box?  Yes Pt endorses 100% compliance  We discussed:   Importance of daily medication adherence  Plan Continue current medication management strategy  Verify pharmacy preferred status on insurance Discuss medication synchronization and adherence packaging at follow up  Follow up: 4 month phone visit  Beverly Milch, PharmD Clinical Pharmacist [place] [phone]

## 2020-04-19 ENCOUNTER — Encounter: Payer: Self-pay | Admitting: Podiatry

## 2020-04-19 ENCOUNTER — Other Ambulatory Visit: Payer: Self-pay

## 2020-04-19 ENCOUNTER — Ambulatory Visit: Payer: Medicare HMO | Admitting: Podiatry

## 2020-04-19 DIAGNOSIS — E119 Type 2 diabetes mellitus without complications: Secondary | ICD-10-CM

## 2020-04-19 NOTE — Progress Notes (Signed)
Subjective: [name] presents today referred by Dorothyann Peng, MD for diabetic foot evaluation.  Patient relates multiple year history of diabetes.  Patient denies any history of foot wounds.  Patient denies any history of numbness, tingling, burning, pins/needles sensations.  Past Medical History:  Diagnosis Date  . Bladder infection   . Carpal tunnel syndrome   . Complication of anesthesia    Difficult to arouse  . Diabetes mellitus   . Hypertension   . Kidney stone   . Kidney stones   . Sleep apnea     Patient Active Problem List   Diagnosis Date Noted  . S/P TKR (total knee replacement), right 04/19/2019  . Osteoarthritis of right knee 04/16/2019  . Obesity, diabetes, and hypertension syndrome (HCC) 06/08/2018  . Bradycardia 06/08/2018  . Chronic fatigue 06/08/2018  . PVC (premature ventricular contraction) 08/03/2017  . Costochondritis 08/02/2017  . DOE (dyspnea on exertion) 08/01/2017  . Chest pain 05/28/2016  . Essential hypertension 05/28/2016  . Diabetes mellitus type 2 in obese (HCC) 05/28/2016  . UPPER RESPIRATORY INFECTION, VIRAL 03/22/2009  . ALLERGIC RHINITIS CAUSE UNSPECIFIED 11/04/2008  . Hyperlipidemia 04/11/2008  . COUGH 04/11/2008  . OBSTRUCTIVE SLEEP APNEA 03/21/2008  . CARPAL TUNNEL RELEASE, BILATERAL, HX OF 03/21/2008    Past Surgical History:  Procedure Laterality Date  . ABDOMINAL HYSTERECTOMY     partial  . APPENDECTOMY    . CARPAL TUNNEL RELEASE Bilateral 1980  . HERNIA REPAIR    . TONSILLECTOMY    . TOTAL KNEE ARTHROPLASTY Right 04/19/2019   Procedure: RIGHT TOTAL KNEE ARTHROPLASTY;  Surgeon: Gean Birchwood, MD;  Location: [facility];  Service: Orthopedics;  Laterality: Right;    Current Outpatient Medications on File Prior to Visit  Medication Sig Dispense Refill  . acetic acid-hydrocortisone (VOSOL-HC) OTIC solution 4 drops in the right ear nightly for 2 weeks then as needed for itching.    . Ascorbic Acid (VITAMIN C) 1000 MG  tablet Take 1,000 mg by mouth daily.    Marland Kitchen aspirin EC 81 MG tablet Take 1 tablet (81 mg total) by mouth 2 (two) times daily. (Patient taking differently: Take 81 mg by mouth every other day. ) 60 tablet 0  . Bacillus Coagulans-Inulin (PROBIOTIC FORMULA PO) Take 30 mg by mouth daily.     . carvedilol (COREG) 3.125 MG tablet carvedilol 3.125 mg tablet  Take 1 tablet twice a day by oral route.    . cetirizine (ZYRTEC) 10 MG tablet Take 10 mg by mouth daily as needed for allergies.     . Cholecalciferol (VITAMIN D3) 50 MCG (2000 UT) capsule Take 1 capsule by mouth daily at 12 noon.     . clotrimazole-betamethasone (LOTRISONE) cream Apply topically.    . Coenzyme Q10 (COQ10) 100 MG CAPS Take 100 mg by mouth daily with lunch.     . estradiol (ESTRACE) 0.1 MG/GM vaginal cream estradiol 0.01% (0.1 mg/gram) vaginal cream  USE A SMALL PEA SIZED AMOUNT ON VULVA DAILY FOR 2 WEEKS    . famotidine (PEPCID AC MAXIMUM STRENGTH) 20 MG tablet Take 20 mg by mouth 2 (two) times daily. As needed    . furosemide (LASIX) 20 MG tablet Take 20 mg by mouth daily as needed (fluid).     . hydrochlorothiazide (MICROZIDE) 12.5 MG capsule Take 12.5 mg by mouth daily as needed (excess fluid/swelling).     Marland Kitchen losartan (COZAAR) 25 MG tablet Take 25 mg by mouth 2 (two) times a week. Take one tablet (25  mg) by mouth twice weekly - Tuesdays and Thursdays    . meloxicam (MOBIC) 15 MG tablet Take 15 mg by mouth daily as needed.     . metFORMIN (GLUCOPHAGE) 500 MG tablet Take 500 mg by mouth 2 (two) times daily with a meal.    . neomycin-polymyxin-hydrocortisone (CORTISPORIN) OTIC solution Place 3 drops into the right ear 3 (three) times daily. 10 mL 0  . potassium chloride SA (K-DUR) 20 MEQ tablet Take 1 tablet (20 mEq total) by mouth daily as needed (with each dose of hydrochlorothiazide). (Patient taking differently: Take 20 mEq by mouth every morning. ) 90 tablet 2  . rosuvastatin (CRESTOR) 10 MG tablet Take 1 tablet (10 mg total) by  mouth daily. 30 tablet 2  . triamcinolone cream (KENALOG) 0.1 % APPLY TO AFFECTED AREA TWICE DAILY AS NEEDED 45 g 0  . fluticasone (FLONASE) 50 MCG/ACT nasal spray Place 1 spray into both nostrils daily. (Patient taking differently: Place 1 spray into both nostrils daily as needed for allergies. ) 16 g 2   No current facility-administered medications on file prior to visit.     Allergies  Allergen Reactions  . Pravastatin Hives  . Haemophilus Influenzae Vaccines     Nausea, pain, body weakness  . Sulfa Antibiotics Itching  . Sulfonamide Derivatives Itching  . Tetanus Toxoids Swelling    Arm swelled at injection site    Social History   Occupational History  . Occupation: retired  Tobacco Use  . Smoking status: Never Smoker  . Smokeless tobacco: Never Used  Vaping Use  . Vaping Use: Never used  Substance and Sexual Activity  . Alcohol use: No  . Drug use: No  . Sexual activity: Not Currently    Family History  Problem Relation Age of Onset  . Heart attack Father 56  . Stroke Father 33  . Diabetes Father   . Diabetes Brother   . Diabetes Brother   . Breast cancer Sister 91  . Diabetes Mother   . Hypertension Mother     Immunization History  Administered Date(s) Administered  . PFIZER SARS-COV-2 Vaccination 09/02/2019, 09/27/2019    Review of systems: Positive Findings in bold print.  Constitutional:  chills, fatigue, fever, sweats, weight change Communication: Nurse, learning disability, sign Presenter, broadcasting, hand writing, iPad/Android device Head: headaches, head injury Eyes: changes in vision, eye pain, glaucoma, cataracts, macular degeneration, diplopia, glare,  light sensitivity, eyeglasses or contacts, blindness Ears nose mouth throat: hearing impaired, hearing aids,  ringing in ears, deaf, sign language,  vertigo, nosebleeds,  rhinitis,  cold sores, snoring, swollen glands Cardiovascular: HTN, edema, arrhythmia, pacemaker in place, defibrillator in place, chest  pain/tightness, chronic anticoagulation, blood clot, heart failure, MI Peripheral Vascular: leg cramps, varicose veins, blood clots, lymphedema, varicosities Respiratory:  asthma, difficulty breathing, denies congestion, SOB, wheezing, cough, emphysema Gastrointestinal: change in appetite or weight, abdominal pain, constipation, diarrhea, nausea, vomiting, vomiting blood, change in bowel habits, abdominal pain, jaundice, rectal bleeding, hemorrhoids, GERD Genitourinary:  nocturia,  pain on urination, polyuria,  blood in urine, Foley catheter, urinary urgency, ESRD on hemodialysis Musculoskeletal: amputation, cramping, stiff joints, painful joints, decreased joint motion, fractures, OA, gout, hemiplegia, paraplegia, uses cane, wheelchair bound, uses walker, uses rollator Skin: +changes in toenails, color change, dryness, itching, mole changes,  rash, wound(s) Neurological: headaches, numbness in feet, paresthesias in feet, burning in feet, fainting,  seizures, change in speech, migraines, memory problems/poor historian, cerebral palsy, weakness, paralysis, CVA, TIA Endocrine: diabetes, hypothyroidism, hyperthyroidism,  goiter, dry mouth,  flushing, heat intolerance, cold intolerance,  excessive thirst, denies polyuria,  nocturia Hematological:  easy bleeding, excessive bleeding, easy bruising, enlarged lymph nodes, on long term blood thinner, history of past transusions Allergy/immunological:  hives, eczema, frequent infections, multiple drug allergies, seasonal allergies, transplant recipient, multiple food allergies Psychiatric:  anxiety, depression, mood disorder, suicidal ideations, hallucinations, insomnia  Objective: There were no vitals filed for this visit. Vascular Examination: Capillary refill time less than 3 x 10 digits.  Dorsalis pedis pulses 2 out of 4.  Posterior tibial pulses 2 out of 4.  Digital hair not present x 10 digits.  Skin temperature gradient WNL b/l.  Dermatological  Examination: Skin with normal turgor, texture and tone b/l  Toenails 1-5 b/l discolored, thick, dystrophic with subungual debris and pain with palpation to nailbeds due to thickness of nails.  Musculoskeletal: Muscle strength 5/5 to all LE muscle groups.  Bilateral HAV deformity noted with hammertoe contractures 2 through 5 bilaterally.  Pain on palpation to them.  Neurological: Sensation intact with 10 gram monofilament.  Vibratory sensation intact.  Assessment: 1. NIDDM 2. Encounter for diabetic foot examination  Plan: 1. Discussed diabetic foot care principles. Literature dispensed on today. 2. Patient to continue soft, supportive shoe gear daily. 3. Patient to report any pedal injuries to medical professional immediately. 4. Follow up one year. 5. Patient/POA to call should there be a concern in the interim. 6. Given the presence of bunions and hammertoes in the setting of excessive pressure from the shoes leading to high risk ulceration given that patient is a diabetic, I believe patient will benefit from diabetic shoes.  This will help evenly distribute the pressure on the plantar aspect of the foot as well as prevent ulcerations on the hammertoe contractures. 7. Should be scheduled see rick for diabetic shoes/insoles

## 2020-04-27 DIAGNOSIS — Z471 Aftercare following joint replacement surgery: Secondary | ICD-10-CM | POA: Diagnosis not present

## 2020-04-27 DIAGNOSIS — Z96651 Presence of right artificial knee joint: Secondary | ICD-10-CM | POA: Diagnosis not present

## 2020-04-27 DIAGNOSIS — M25561 Pain in right knee: Secondary | ICD-10-CM | POA: Diagnosis not present

## 2020-04-27 DIAGNOSIS — R69 Illness, unspecified: Secondary | ICD-10-CM | POA: Diagnosis not present

## 2020-05-01 ENCOUNTER — Other Ambulatory Visit: Payer: Self-pay | Admitting: Orthopedic Surgery

## 2020-05-01 DIAGNOSIS — Z96651 Presence of right artificial knee joint: Secondary | ICD-10-CM

## 2020-05-01 DIAGNOSIS — M25561 Pain in right knee: Secondary | ICD-10-CM

## 2020-05-02 ENCOUNTER — Ambulatory Visit: Payer: Medicare HMO | Admitting: Orthotics

## 2020-05-02 ENCOUNTER — Other Ambulatory Visit: Payer: Self-pay

## 2020-05-02 DIAGNOSIS — E1169 Type 2 diabetes mellitus with other specified complication: Secondary | ICD-10-CM

## 2020-05-02 DIAGNOSIS — E669 Obesity, unspecified: Secondary | ICD-10-CM

## 2020-05-02 DIAGNOSIS — M2011 Hallux valgus (acquired), right foot: Secondary | ICD-10-CM

## 2020-05-02 NOTE — Progress Notes (Signed)

## 2020-05-04 DIAGNOSIS — R0600 Dyspnea, unspecified: Secondary | ICD-10-CM | POA: Diagnosis not present

## 2020-05-04 DIAGNOSIS — R0602 Shortness of breath: Secondary | ICD-10-CM | POA: Diagnosis not present

## 2020-05-04 DIAGNOSIS — H6091 Unspecified otitis externa, right ear: Secondary | ICD-10-CM | POA: Diagnosis not present

## 2020-05-04 DIAGNOSIS — G4733 Obstructive sleep apnea (adult) (pediatric): Secondary | ICD-10-CM | POA: Diagnosis not present

## 2020-05-04 DIAGNOSIS — I10 Essential (primary) hypertension: Secondary | ICD-10-CM | POA: Diagnosis not present

## 2020-05-09 DIAGNOSIS — H10413 Chronic giant papillary conjunctivitis, bilateral: Secondary | ICD-10-CM | POA: Diagnosis not present

## 2020-05-09 DIAGNOSIS — E119 Type 2 diabetes mellitus without complications: Secondary | ICD-10-CM | POA: Diagnosis not present

## 2020-05-09 DIAGNOSIS — H25813 Combined forms of age-related cataract, bilateral: Secondary | ICD-10-CM | POA: Diagnosis not present

## 2020-05-09 DIAGNOSIS — H353131 Nonexudative age-related macular degeneration, bilateral, early dry stage: Secondary | ICD-10-CM | POA: Diagnosis not present

## 2020-05-09 DIAGNOSIS — H43813 Vitreous degeneration, bilateral: Secondary | ICD-10-CM | POA: Diagnosis not present

## 2020-05-09 DIAGNOSIS — H04123 Dry eye syndrome of bilateral lacrimal glands: Secondary | ICD-10-CM | POA: Diagnosis not present

## 2020-05-09 LAB — HM DIABETES EYE EXAM

## 2020-05-15 ENCOUNTER — Encounter (HOSPITAL_COMMUNITY)
Admission: RE | Admit: 2020-05-15 | Discharge: 2020-05-15 | Disposition: A | Discharge status: Home / Self Care | Payer: Medicare HMO | Source: Ambulatory Visit | Attending: Orthopedic Surgery | Admitting: Orthopedic Surgery

## 2020-05-15 ENCOUNTER — Ambulatory Visit (HOSPITAL_COMMUNITY)
Admission: RE | Admit: 2020-05-15 | Discharge: 2020-05-15 | Disposition: A | Discharge status: Home / Self Care | Payer: Medicare HMO | Source: Ambulatory Visit | Attending: Orthopedic Surgery | Admitting: Orthopedic Surgery

## 2020-05-15 ENCOUNTER — Other Ambulatory Visit: Payer: Self-pay

## 2020-05-15 DIAGNOSIS — M25561 Pain in right knee: Secondary | ICD-10-CM | POA: Insufficient documentation

## 2020-05-15 DIAGNOSIS — Z96651 Presence of right artificial knee joint: Secondary | ICD-10-CM | POA: Diagnosis present

## 2020-05-15 MED ORDER — TECHNETIUM TC 99M MEDRONATE IV KIT
20.0000 | PACK | Freq: Once | INTRAVENOUS | Status: AC | PRN
  Administered 2020-05-15: 20 via INTRAVENOUS

## 2020-05-17 DIAGNOSIS — N3021 Other chronic cystitis with hematuria: Secondary | ICD-10-CM | POA: Diagnosis not present

## 2020-05-17 DIAGNOSIS — R102 Pelvic and perineal pain: Secondary | ICD-10-CM | POA: Diagnosis not present

## 2020-05-25 ENCOUNTER — Other Ambulatory Visit: Payer: Self-pay

## 2020-05-25 ENCOUNTER — Encounter: Payer: Self-pay | Admitting: Cardiology

## 2020-05-25 ENCOUNTER — Ambulatory Visit: Payer: Medicare HMO | Admitting: Cardiology

## 2020-05-25 VITALS — BP 132/74 | HR 62 | Resp 14 | Ht 62.6 in | Wt 220.0 lb

## 2020-05-25 DIAGNOSIS — R002 Palpitations: Secondary | ICD-10-CM

## 2020-05-25 DIAGNOSIS — R0609 Other forms of dyspnea: Secondary | ICD-10-CM

## 2020-05-25 DIAGNOSIS — Z96651 Presence of right artificial knee joint: Secondary | ICD-10-CM | POA: Diagnosis not present

## 2020-05-25 DIAGNOSIS — G4734 Idiopathic sleep related nonobstructive alveolar hypoventilation: Secondary | ICD-10-CM | POA: Diagnosis not present

## 2020-05-25 DIAGNOSIS — I700 Atherosclerosis of aorta: Secondary | ICD-10-CM

## 2020-05-25 DIAGNOSIS — E7800 Pure hypercholesterolemia, unspecified: Secondary | ICD-10-CM

## 2020-05-25 DIAGNOSIS — R0600 Dyspnea, unspecified: Secondary | ICD-10-CM

## 2020-05-25 DIAGNOSIS — Z471 Aftercare following joint replacement surgery: Secondary | ICD-10-CM | POA: Diagnosis not present

## 2020-05-25 MED ORDER — ROSUVASTATIN CALCIUM 20 MG PO TABS: 20.0000 mg | ORAL_TABLET | Freq: Every day | ORAL | 3 refills | Status: DC

## 2020-05-25 NOTE — Progress Notes (Signed)
Primary Physician/Referring:  Dorothyann PengSanders, Robyn, MD  Patient ID: [name], female    DOB: [birth year], 74 y.o.   MRN: [id number]  No chief complaint on file.   HPI: [name]  is a 74 y.o. female  with  diabetes mellitus, hypertension, hyperlipidemia, morbid obesity, nocturnal hypoxemia presently on oxygen supplementation, chronic dyspnea and palpitations. She also has GERD and hiatal hernia.   Over the past 6 to 8 months she has noticed worsening dyspnea, I will repeated her echocardiogram and she now presents for follow-up.  No significant change in her symptoms.  No PND or orthopnea.  No leg edema.  Denies chest pain, has occasional palpitations that are chronic.  Past Medical History:  Diagnosis Date  . Bladder infection   . Carpal tunnel syndrome   . Complication of anesthesia    Difficult to arouse  . Diabetes mellitus   . Hypertension   . Kidney stone   . Kidney stones   . Sleep apnea     Past Surgical History:  Procedure Laterality Date  . ABDOMINAL HYSTERECTOMY     partial  . APPENDECTOMY    . CARPAL TUNNEL RELEASE Bilateral 1980  . HERNIA REPAIR    . TONSILLECTOMY    . TOTAL KNEE ARTHROPLASTY Right 04/19/2019   Procedure: RIGHT TOTAL KNEE ARTHROPLASTY;  Surgeon: Gean Birchwoodowan, Frank, MD;  Location: [facility];  Service: Orthopedics;  Laterality: Right;    Social History   Tobacco Use  . Smoking status: Never Smoker  . Smokeless tobacco: Never Used  Substance Use Topics  . Alcohol use: No   Marital Status: Widowed   Review of Systems  Constitutional: Negative for malaise/fatigue.  Cardiovascular: Positive for dyspnea on exertion and palpitations. Negative for claudication, leg swelling (chronic and stable) and syncope.  Hematologic/Lymphatic: Does not bruise/bleed easily.  Musculoskeletal: Positive for joint pain. Negative for joint swelling.  All other systems reviewed and are negative.  Objective  Blood pressure 132/74, pulse 62, resp. rate 14, height  5' 2.6" (1.59 m), weight 220 lb (99.8 kg), SpO2 97 %. Body mass index is 39.47 kg/m.  Vitals with BMI 05/25/2020 04/10/2020 03/27/2020  Height 5' 2.6" 5' 2.6" -  Weight 220 lbs 215 lbs 10 oz -  BMI 39.47 38.68 -  Systolic 132 124 147147  Diastolic 74 78 78  Pulse 62 53 59    Physical Exam Constitutional:      Appearance: She is obese.  Cardiovascular:     Rate and Rhythm: Normal rate and regular rhythm.     Pulses:          Carotid pulses are 2+ on the right side and 2+ on the left side.      Femoral pulses are 2+ on the right side and 2+ on the left side.      Popliteal pulses are 2+ on the right side and 2+ on the left side.       Dorsalis pedis pulses are 2+ on the right side and 2+ on the left side.       Posterior tibial pulses are 2+ on the right side and 0 on the left side.     Heart sounds: Normal heart sounds. No murmur heard.  No gallop.      Comments: Trace leg edema, no JVD. Pulmonary:     Effort: Pulmonary effort is normal.     Breath sounds: Normal breath sounds.  Abdominal:     General: Bowel sounds are normal.  Palpations: Abdomen is soft.    Radiology: No results found.  Laboratory examination:    CMP Latest Ref Rng & Units 04/10/2020 12/09/2019 08/10/2019  Glucose 65 - 99 mg/dL 814(G) 818(H) 631(S)  BUN 8 - 27 mg/dL 12 11 12   Creatinine 0.57 - 1.00 mg/dL 9.70 2.63  Sodium 134 - 144 mmol/L 144 143 145(H)  Potassium 3.5 - 5.2 mmol/L 4.3 4.8 4.2  Chloride 96 - 106 mmol/L 105 107(H) 107(H)  CO2 20 - 29 mmol/L 26 24 24   Calcium 8.7 - 10.3 mg/dL 9.5 9.2 9.3  Total Protein 6.0 - 8.5 g/dL - - 6.6  Total Bilirubin 0.0 - 1.2 mg/dL - - 0.3  Alkaline Phos 39 - 117 IU/L - - 103  AST 0 - 40 IU/L - - 12  ALT 0 - 32 IU/L 10 - 7   CBC Latest Ref Rng & Units 04/22/2019 04/21/2019 04/20/2019  WBC 4.0 - 10.5 K/uL 10.5 11.0(H) 9.5  Hemoglobin 12.0 - 15.0 g/dL 10.2(L) 10.0(L) 10.8(L)  Hematocrit 36 - 46 % 32.9(L) 32.1(L) 35.7(L)  Platelets 150 - 400 K/uL 205 217  220   Lipid Panel Recent Labs    08/10/19 1050 04/10/20 1149  CHOL 205* 187  TRIG 70 51  LDLCALC 117* 101*  HDL 75 76  CHOLHDL 2.7 2.5    HEMOGLOBIN 08/12/19 Lab Results  Component Value Date   HGBA1C 6.1 (H) 04/10/2020   MPG 128.37 04/19/2019   TSH No results for input(s): TSH in the last 8760 hours.   Outpatient Medications Prior to Visit  Medication Sig Dispense Refill  . acetic acid-hydrocortisone (VOSOL-HC) OTIC solution 4 drops in the right ear nightly for 2 weeks then as needed for itching.    . Ascorbic Acid (VITAMIN C) 1000 MG tablet Take 1,000 mg by mouth daily.    04/12/2020 aspirin EC 81 MG tablet Take 1 tablet (81 mg total) by mouth 2 (two) times daily. (Patient taking differently: Take 81 mg by mouth every other day. ) 60 tablet 0  . Bacillus Coagulans-Inulin (PROBIOTIC FORMULA PO) Take 30 mg by mouth daily.     . carvedilol (COREG) 3.125 MG tablet carvedilol 3.125 mg tablet  Take 1 tablet twice a day by oral route.    . cetirizine (ZYRTEC) 10 MG tablet Take 10 mg by mouth daily as needed for allergies.     . Cholecalciferol (VITAMIN D3) 50 MCG (2000 UT) capsule Take 1 capsule by mouth daily at 12 noon.     . clotrimazole-betamethasone (LOTRISONE) cream Apply topically.    . Coenzyme Q10 (COQ10) 100 MG CAPS Take 100 mg by mouth daily with lunch.     . estradiol (ESTRACE) 0.1 MG/GM vaginal cream estradiol 0.01% (0.1 mg/gram) vaginal cream  USE A SMALL PEA SIZED AMOUNT ON VULVA DAILY FOR 2 WEEKS    . famotidine (PEPCID AC MAXIMUM STRENGTH) 20 MG tablet Take 20 mg by mouth 2 (two) times daily. As needed    . fluticasone (FLONASE) 50 MCG/ACT nasal spray Place 1 spray into both nostrils daily. (Patient taking differently: Place 1 spray into both nostrils daily as needed for allergies. ) 16 g 2  . furosemide (LASIX) 20 MG tablet Take 20 mg by mouth daily as needed (fluid).     . hydrochlorothiazide (MICROZIDE) 12.5 MG capsule Take 12.5 mg by mouth daily as needed (excess  fluid/swelling).     04/21/2019 losartan (COZAAR) 25 MG tablet Take 25 mg by mouth 2 (two) times a week.  Take one tablet (25 mg) by mouth twice weekly - Tuesdays and Thursdays    . meloxicam (MOBIC) 15 MG tablet Take 15 mg by mouth daily as needed.     . metFORMIN (GLUCOPHAGE) 500 MG tablet Take 500 mg by mouth 2 (two) times daily with a meal.    . potassium chloride SA (K-DUR) 20 MEQ tablet Take 1 tablet (20 mEq total) by mouth daily as needed (with each dose of hydrochlorothiazide). (Patient taking differently: Take 20 mEq by mouth every morning. ) 90 tablet 2  . rosuvastatin (CRESTOR) 10 MG tablet Take 1 tablet (10 mg total) by mouth daily. 30 tablet 2  . neomycin-polymyxin-hydrocortisone (CORTISPORIN) OTIC solution Place 3 drops into the right ear 3 (three) times daily. 10 mL 0  . triamcinolone cream (KENALOG) 0.1 % APPLY TO AFFECTED AREA TWICE DAILY AS NEEDED 45 g 0   No facility-administered medications prior to visit.    Cardiac Studies:   ABI 04/01/2013: This exam reveals normal perfusion of both the lower extremities with bilateral ABI of 1.04.  Abdominal Aortic Duplex  03/06/2020: Mild heterogeneous plaque noted in the abdominal aorta. The maximum aorta (sac) diameter is 2.26 cm (dist). with minimal ectasia.  No AAA observed.  Bilateral internal iliac artery velocity mildly elevated suggesting <50% stenosis.  Lexiscan myoview stress test 08/18/2017:  1. Pharmacologic stress testing was performed with intravenous administration of .4 mg of Lexiscan over a 10-15 seconds infusion. Patient was hypertensive through out the study, max BP 160/88 mmHg. Stress symptoms included dyspnea, nausea. Exercise capacity not assessed. Stress EKG is non diagnostic for ischemia as it is a pharmacologic stress.  2. The overall quality of the study is good.  Left ventricular cavity is noted to be normal on the rest and stress studies.  Review of the raw data in a rotational cine format reveals breast attenuation,  with imaging performed in sitting position. Gated SPECT images reveal normal myocardial thickening and wall motion.  The left ventricular ejection fraction was calculated or visually estimated to be 62%.  REST and STRESS images demonstrate medium sized area of moderately decreased tracer uptake in the basal inferoseptal, mid inferoseptal and apical inferior segments of the left ventricle. Defect improves on stress images. Defects are likely related to breast attenuation. Superimposed ischemia in this region cannot be excluded.  3. This is a low risk study.  Echocardiogram 02/25/2020: Normal LV systolic function with visual EF 60-65%. Left ventricle cavity is normal in size. Mild concentric hypertrophy of the left ventricle. Normal global wall motion. Normal diastolic filling pattern, normal LAP. Calculated EF 57%. Mild (Grade I) mitral regurgitation. Mild tricuspid regurgitation. No evidence of pulmonary hypertension. Mild pulmonic regurgitation. IVC is dilated with respiratory variation. No significant change compared to prior study dated 08/26/2017.   EKG:  EKG 02/23/2020: Sinus bradycardia at rate of 59 bpm, left atrial enlargement, normal axis, LVH.  Nonspecific T abnormality.  No significant change from 06/09/2019.  Assessment   Palpitations  Dyspnea on exertion  Pure hypercholesterolemia - Plan: rosuvastatin (CRESTOR) 20 MG tablet  Abdominal aortic atherosclerosis (HCC)  Nocturnal hypoxemia   Medications Discontinued During This Encounter  Medication Reason  . neomycin-polymyxin-hydrocortisone (CORTISPORIN) OTIC solution Change in therapy  . triamcinolone cream (KENALOG) 0.1 % Patient Preference  . rosuvastatin (CRESTOR) 10 MG tablet Reorder    Meds ordered this encounter  Medications  . rosuvastatin (CRESTOR) 20 MG tablet    Sig: Take 1 tablet (20 mg total) by mouth daily.  Dispense:  90 tablet    Refill:  3    Discontinue Pravastatin   Recommendations:   SWAYZEE WADLEY  is a 74 y.o. female  with  diabetes mellitus, hypertension, hyperlipidemia, morbid obesity, nocturnal hypoxemia presently on oxygen supplementation, chronic dyspnea and palpitations. She also has GERD and hiatal hernia.  She underwent knee replacement October 2020, since then she has noticed decreased physical capacity and also worsening dyspnea on exertion and palpitations with exertion activity.  On her last office visit 2 months ago, I will obtain an echocardiogram to evaluate for pulmonary tension, mitral regurgitation, fortunately echocardiogram is very stable and there is no evidence of pulmonary hypertension and there is only mild MR.  She had not tolerated pravastatin however she is able to tolerate Crestor 10 mg, will increase it to 20 mg daily.  If she does not tolerate 20 mg she can go back to 10 mg.  In view of diabetes mellitus, I was trying to be aggressive with LDL control, LDL is improved to 100, would like to try to get this down to closer to 70 if possible, this is in view of abdominal aortic atherosclerosis as well.  The prominent abdominal pulsation was felt previously not felt today, aortic duplex fortunately does not reveal AAA.  I suspect deconditioning to be the etiology of her dyspnea and palpitations.  She appears to be motivated in increasing her physical activity.  She has had a negative nuclear stress test in 2019.  Stable from cardiac standpoint, I will see her back on a as needed basis.   Yates Decamp, MD, [facility] 05/25/2020, 10:58 AM Office: [phone]

## 2020-06-03 DIAGNOSIS — R0602 Shortness of breath: Secondary | ICD-10-CM | POA: Diagnosis not present

## 2020-06-03 DIAGNOSIS — R0600 Dyspnea, unspecified: Secondary | ICD-10-CM | POA: Diagnosis not present

## 2020-06-03 DIAGNOSIS — G4733 Obstructive sleep apnea (adult) (pediatric): Secondary | ICD-10-CM | POA: Diagnosis not present

## 2020-06-03 DIAGNOSIS — I10 Essential (primary) hypertension: Secondary | ICD-10-CM | POA: Diagnosis not present

## 2020-06-08 ENCOUNTER — Ambulatory Visit (INDEPENDENT_AMBULATORY_CARE_PROVIDER_SITE_OTHER): Payer: Medicare HMO

## 2020-06-08 ENCOUNTER — Other Ambulatory Visit: Payer: Self-pay

## 2020-06-08 VITALS — Ht 62.0 in | Wt 212.0 lb

## 2020-06-08 DIAGNOSIS — Z0000 Encounter for general adult medical examination without abnormal findings: Secondary | ICD-10-CM | POA: Diagnosis not present

## 2020-06-08 MED ORDER — FUROSEMIDE 20 MG PO TABS
20.0000 mg | ORAL_TABLET | Freq: Every day | ORAL | 1 refills | Status: DC | PRN
Start: 2020-06-08 — End: 2023-01-22

## 2020-06-08 MED ORDER — LOSARTAN POTASSIUM 25 MG PO TABS
25.0000 mg | ORAL_TABLET | ORAL | 3 refills | Status: DC
Start: 2020-06-08 — End: 2021-11-16

## 2020-06-08 MED ORDER — METFORMIN HCL 500 MG PO TABS
500.0000 mg | ORAL_TABLET | Freq: Two times a day (BID) | ORAL | 1 refills | Status: DC
Start: 2020-06-08 — End: 2021-03-15

## 2020-06-08 NOTE — Progress Notes (Signed)
I connected with [name] today by telephone and verified that I am speaking with the correct person using two identifiers. Location patient: home Location provider: work Persons participating in the virtual visit: Donita Newland, Elisha Ponder LPN.   I discussed the limitations, risks, security and privacy concerns of performing an evaluation and management service by telephone and the availability of in person appointments. I also discussed with the patient that there may be a patient responsible charge related to this service. The patient expressed understanding and verbally consented to this telephonic visit.    Interactive audio and video telecommunications were attempted between this provider and patient, however failed, due to patient having technical difficulties OR patient did not have access to video capability.  We continued and completed visit with audio only.     Vital signs may be patient reported or missing  Subjective:   [name] is a 74 y.o. female who presents for Medicare Annual (Subsequent) preventive examination.  Review of Systems     Cardiac Risk Factors include: advanced age (>11men, >67 women);diabetes mellitus;hypertension;sedentary lifestyle;obesity (BMI >30kg/m2)     Objective:    Today's Vitals   06/08/20 1209  Weight: 212 lb (96.2 kg)  Height:  (1.575 m)   Body mass index is 38.78 kg/m.  Advanced Directives 06/08/2020 06/09/2019 04/20/2019 04/19/2019 04/19/2019 04/13/2019 06/04/2018  Does Patient Have a Medical Advance Directive? Yes Yes No No No No Yes  Type of Estate agent of [place];Living will Healthcare Power of [place];Living will - - - - Midwife;Living will  Does patient want to make changes to medical advance directive? - - - - - - No - Patient declined  Copy of Healthcare Power of Attorney in Chart? No - copy requested No - copy requested - - - - No - copy requested  Would  patient like information on creating a medical advance directive? - - No - Patient declined No - Patient declined No - Patient declined No - Patient declined -    Current Medications (verified) Outpatient Encounter Medications as of 06/08/2020  Medication Sig  . acetic acid-hydrocortisone (VOSOL-HC) OTIC solution 4 drops in the right ear nightly for 2 weeks then as needed for itching.  . Ascorbic Acid (VITAMIN C) 1000 MG tablet Take 1,000 mg by mouth daily.  Marland Kitchen aspirin EC 81 MG tablet Take 1 tablet (81 mg total) by mouth 2 (two) times daily. (Patient taking differently: Take 81 mg by mouth every other day.)  . Bacillus Coagulans-Inulin (PROBIOTIC FORMULA PO) Take 30 mg by mouth daily.   . carvedilol (COREG) 3.125 MG tablet carvedilol 3.125 mg tablet  Take 1 tablet twice a day by oral route.  . Cholecalciferol (VITAMIN D3) 50 MCG (2000 UT) capsule Take 1 capsule by mouth daily at 12 noon.   . clotrimazole-betamethasone (LOTRISONE) cream Apply topically.  . Coenzyme Q10 (COQ10) 100 MG CAPS Take 100 mg by mouth daily with lunch.   . estradiol (ESTRACE) 0.1 MG/GM vaginal cream estradiol 0.01% (0.1 mg/gram) vaginal cream  USE A SMALL PEA SIZED AMOUNT ON VULVA DAILY FOR 2 WEEKS  . famotidine (PEPCID) 20 MG tablet Take 20 mg by mouth 2 (two) times daily. As needed  . furosemide (LASIX) 20 MG tablet Take 20 mg by mouth daily as needed (fluid).   . hydrochlorothiazide (MICROZIDE) 12.5 MG capsule Take 12.5 mg by mouth daily as needed (excess fluid/swelling).   Marland Kitchen loratadine (CLARITIN) 10 MG tablet Take 10 mg by  mouth daily.  Marland Kitchen losartan (COZAAR) 25 MG tablet Take 25 mg by mouth 2 (two) times a week. Take one tablet (25 mg) by mouth twice weekly - Tuesdays and Thursdays  . meloxicam (MOBIC) 15 MG tablet Take 15 mg by mouth daily as needed.   . metFORMIN (GLUCOPHAGE) 500 MG tablet Take 500 mg by mouth 2 (two) times daily with a meal.  . potassium chloride SA (K-DUR) 20 MEQ tablet Take 1 tablet (20 mEq  total) by mouth daily as needed (with each dose of hydrochlorothiazide). (Patient taking differently: Take 20 mEq by mouth every morning.)  . rosuvastatin (CRESTOR) 20 MG tablet Take 1 tablet (20 mg total) by mouth daily.  . cetirizine (ZYRTEC) 10 MG tablet Take 10 mg by mouth daily as needed for allergies.  (Patient not taking: Reported on 06/08/2020)  . fluticasone (FLONASE) 50 MCG/ACT nasal spray Place 1 spray into both nostrils daily. (Patient taking differently: Place 1 spray into both nostrils daily as needed for allergies. )   No facility-administered encounter medications on file as of 06/08/2020.    Allergies (verified) Pravastatin, Haemophilus influenzae vaccines, Sulfa antibiotics, Sulfonamide derivatives, and Tetanus toxoids   History: Past Medical History:  Diagnosis Date  . Bladder infection   . Carpal tunnel syndrome   . Complication of anesthesia    Difficult to arouse  . Diabetes mellitus   . Hypertension   . Kidney stone   . Kidney stones   . Sleep apnea    Past Surgical History:  Procedure Laterality Date  . ABDOMINAL HYSTERECTOMY     partial  . APPENDECTOMY    . CARPAL TUNNEL RELEASE Bilateral 1980  . HERNIA REPAIR    . TONSILLECTOMY    . TOTAL KNEE ARTHROPLASTY Right 04/19/2019   Procedure: RIGHT TOTAL KNEE ARTHROPLASTY;  Surgeon: Gean Birchwood, MD;  Location: [facility];  Service: Orthopedics;  Laterality: Right;   Family History  Problem Relation Age of Onset  . Heart attack Father 77  . Stroke Father 74  . Diabetes Father   . Diabetes Brother   . Diabetes Brother   . Breast cancer Sister 56  . Diabetes Mother   . Hypertension Mother    Social History   Socioeconomic History  . Marital status: Widowed    Spouse name: Not on file  . Number of children: 2  . Years of education: Not on file  . Highest education level: Not on file  Occupational History  . Occupation: retired  Tobacco Use  . Smoking status: Never Smoker  . Smokeless tobacco:  Never Used  Vaping Use  . Vaping Use: Never used  Substance and Sexual Activity  . Alcohol use: No  . Drug use: No  . Sexual activity: Not Currently  Other Topics Concern  . Not on file  Social History Narrative  . Not on file   Social Determinants of Health   Financial Resource Strain: Low Risk   . Difficulty of Paying Living Expenses: Not hard at all  Food Insecurity: No Food Insecurity  . Worried About Programme researcher, broadcasting/film/video in the Last Year: Never true  . Ran Out of Food in the Last Year: Never true  Transportation Needs: No Transportation Needs  . Lack of Transportation (Medical): No  . Lack of Transportation (Non-Medical): No  Physical Activity: Inactive  . Days of Exercise per Week: 0 days  . Minutes of Exercise per Session: 0 min  Stress: No Stress Concern Present  . Feeling of  Stress : Not at all  Social Connections: Not on file    Tobacco Counseling Counseling given: Not Answered   Clinical Intake:  Pre-visit preparation completed: Yes  Pain : No/denies pain     Nutritional Status: BMI > 30  Obese Nutritional Risks: None Diabetes: Yes  How often do you need to have someone help you when you read instructions, pamphlets, or other written materials from your doctor or pharmacy?: 1 - Never What is the last grade level you completed in school?: 1 1/2 college  Diabetic? Yes Nutrition Risk Assessment:  Has the patient had any N/V/D within the last 2 months?  No  Does the patient have any non-healing wounds?  No  Has the patient had any unintentional weight loss or weight gain?  No   Diabetes:  Is the patient diabetic?  Yes  If diabetic, was a CBG obtained today?  No  Did the patient bring in their glucometer from home?  Yes  How often do you monitor your CBG's? Twice daily.   Financial Strains and Diabetes Management:  Are you having any financial strains with the device, your supplies or your medication? No .  Does the patient want to be seen by  Chronic Care Management for management of their diabetes?  No  Would the patient like to be referred to a Nutritionist or for Diabetic Management?  No   Diabetic Exams:  Diabetic Eye Exam: Overdue for diabetic eye exam. Pt has been advised about the importance in completing this exam. Patient advised to call and schedule an eye exam. Diabetic Foot Exam: Overdue, Pt has been advised about the importance in completing this exam. Pt is scheduled for diabetic foot exam on next appointment.   Interpreter Needed?: No  Information entered by :: NAllen LPN   Activities of Daily Living In your present state of health, do you have any difficulty performing the following activities: 06/08/2020 06/09/2019  Hearing? N N  Vision? Y N  Comment a little blurry, has cataracts -  Difficulty concentrating or making decisions? N N  Walking or climbing stairs? N Y  Comment - with knee  Dressing or bathing? N N  Doing errands, shopping? N N  Preparing Food and eating ? N N  Using the Toilet? N N  In the past six months, have you accidently leaked urine? N Y  Comment sometimes due to fluid pills takes fluid pills  Do you have problems with loss of bowel control? N N  Managing your Medications? N N  Managing your Finances? N N  Housekeeping or managing your Housekeeping? N N  Some recent data might be hidden    Patient Care Team: Dorothyann Peng, MD as PCP - General (Internal Medicine) C[place], Maryjane Hurter, [facility] (Inactive) (Pharmacist)  Indicate any recent Medical Services you may have received from other than [facility] providers in the past year (date may be approximate).     Assessment:   This is a routine wellness examination for [place].  Hearing/Vision screen No exam data present  Dietary issues and exercise activities discussed: Current Exercise Habits: The patient does not participate in regular exercise at present  Goals    .  DIET - INCREASE WATER INTAKE (pt-stated)    .  Exercise 150  min/wk Moderate Activity (pt-stated)    .  Patient Stated      06/09/2019, wants to get knee replacement done    .  Patient Stated      06/08/2020, continue losing  weight and get over allergies    .  Pharmacy Care Plan      CARE PLAN ENTRY (see longitudinal plan of care for additional care plan information)  Current Barriers:  . Chronic Disease Management support, education, and care coordination needs related to Hypertension, Hyperlipidemia, and Diabetes   Hypertension BP Readings from Last 3 Encounters:  12/09/19 110/76  10/07/19 140/80  08/10/19 126/80   . Pharmacist Clinical Goal(s): o Over the next 180 days, patient will work with PharmD and providers to maintain BP goal <130/80 . Current regimen:   Furosemide 20mg  daily as needed  Hydrochlorothiazide 12.5mg  daily as needed  Losartan 25mg  twice weekly  Potassium 20mEq daily as needed with HCTZ . Interventions: o Provided dietary and exercise recommendations . Patient self care activities - Over the next 180 days, patient will: o Check BP weekly, document, and provide at future appointments o Obtain a blood pressure monitor from insurance o Ensure daily salt intake < 2300 mg/day  Hyperlipidemia Lab Results  Component Value Date/Time   LDLCALC 117 (H) 08/10/2019 10:50 AM   . Pharmacist Clinical Goal(s): o Over the next 90 days, patient will work with PharmD and providers to achieve LDL goal < 70 . Current regimen:  . Pravastatin 80mg  twice weekly . CoQ10 100mg  daily . Interventions: o Provided dietary and exercise recommendations o Discussed prior statin therapy (patient does not recall any) o Monitor next lipid panel. Consider increasing statin if LDL still elevated . Patient self care activities - Over the next 90 days, patient will: o Take cholesterol medication daily as directed o Try water aerobics  Diabetes Lab Results  Component Value Date/Time   HGBA1C 5.9 (H) 12/09/2019 11:19 AM   HGBA1C 6.1 (H)  08/10/2019 10:50 AM   . Pharmacist Clinical Goal(s): o Over the next 180 days, patient will work with PharmD and providers to maintain A1c goal <7% . Current regimen:  o Metformin 500mg  twice daily with a meal . Interventions: o Provided dietary and exercise recommendations . Patient self care activities - Over the next 180 days, patient will: o Check blood sugar every 2-3 days, document, and provide at future appointments o Contact provider with any episodes of hypoglycemia o Try water aerobics  Medication management . Pharmacist Clinical Goal(s): o Over the next 90 days, patient will work with PharmD and providers to maintain optimal medication adherence . Current pharmacy: [facility] . Interventions o Comprehensive medication review performed. o Continue current medication management strategy . Patient self care activities - Over the next 90 days, patient will: o Focus on medication adherence by continued use of pill box o Take medications as prescribed o Report any questions or concerns to PharmD and/or provider(s)  Initial goal documentation       Depression Screen PHQ 2/9 Scores 06/08/2020 10/07/2019 06/09/2019 09/02/2018 07/02/2018 06/04/2018 06/04/2018  PHQ - 2 Score 0 0 0 0 0 0 0  PHQ- 9 Score - - 3 - - - -    Fall Risk Fall Risk  06/08/2020 10/07/2019 06/09/2019 09/02/2018 07/02/2018  Falls in the past year? 0 0 0 0 0  Risk for fall due to : Medication side effect - Medication side effect - -  Follow up Falls evaluation completed;Education provided;Falls prevention discussed - Falls evaluation completed;Education provided;Falls prevention discussed - -    FALL RISK PREVENTION PERTAINING TO THE HOME:  Any stairs in or around the home? Yes  If so, are there any without handrails? No  Home free of  loose throw rugs in walkways, pet beds, electrical cords, etc? Yes  Adequate lighting in your home to reduce risk of falls? Yes   ASSISTIVE DEVICES UTILIZED TO PREVENT  FALLS:  Life alert? No  Use of a cane, walker or w/c? No  Grab bars in the bathroom? Yes  Shower chair or bench in shower? No  Elevated toilet seat or a handicapped toilet? Yes   TIMED UP AND GO:  Was the test performed? No .  Cognitive Function:     6CIT Screen 06/08/2020 06/09/2019 06/04/2018  What Year? 0 points 0 points 0 points  What month? 0 points 0 points 0 points  What time? 0 points 0 points 3 points  Count back from 20 2 points 0 points 0 points  Months in reverse 2 points 2 points 2 points  Repeat phrase 0 points 4 points 2 points  Total Score 4 6 7     Immunizations Immunization History  Administered Date(s) Administered  . PFIZER SARS-COV-2 Vaccination 09/02/2019, 09/27/2019    TDAP status: allergy  Flu Vaccine status: allergy  Pneumococcal vaccine status: decline  Covid-19 vaccine status: Completed vaccines  Qualifies for Shingles Vaccine? Yes   Zostavax completed No   Shingrix Completed?: No.    Education has been provided regarding the importance of this vaccine. Patient has been advised to call insurance company to determine out of pocket expense if they have not yet received this vaccine. Advised may also receive vaccine at local pharmacy or Health Dept. Verbalized acceptance and understanding.  Screening Tests Health Maintenance  Topic Date Due  . OPHTHALMOLOGY EXAM  02/18/2020  . COVID-19 Vaccine (3 - Booster for Pfizer series) 03/28/2020  . FOOT EXAM  06/08/2020  . TETANUS/TDAP  06/08/2020 (Originally 11/16/1964)  . PNA vac Low Risk Adult (1 of 2 - PCV13) 06/08/2020 (Originally 11/15/2009)  . INFLUENZA VACCINE  09/21/2020 (Originally 01/23/2020)  . HEMOGLOBIN A1C  10/09/2020  . COLONOSCOPY  05/14/2025  . DEXA SCAN  Completed  . Hepatitis C Screening  Completed    Health Maintenance  Health Maintenance Due  Topic Date Due  . OPHTHALMOLOGY EXAM  02/18/2020  . COVID-19 Vaccine (3 - Booster for Pfizer series) 03/28/2020  . FOOT EXAM   06/08/2020    Colorectal cancer screening: Type of screening: Colonoscopy. Completed 05/15/2015. Repeat every 10 years  Mammogram status: Completed 12/20/2019. Repeat every year  Bone Density status: Completed 09/27/2015.   Lung Cancer Screening: (Low Dose CT Chest recommended if Age 59-80 years, 30 pack-year currently smoking OR have quit w/in 15years.) does not qualify.   Lung Cancer Screening Referral: no  Additional Screening:  Hepatitis C Screening: does qualify; Completed 09/30/2012  Vision Screening: Recommended annual ophthalmology exams for early detection of glaucoma and other disorders of the eye. Is the patient up to date with their annual eye exam?  No  Who is the provider or what is the name of the office in which the patient attends annual eye exams? Dr. 11/30/2012 If pt is not established with a provider, would they like to be referred to a provider to establish care? No .   Dental Screening: Recommended annual dental exams for proper oral hygiene  Community Resource Referral / Chronic Care Management: CRR required this visit?  No   CCM required this visit?  No      Plan:     I have personally reviewed and noted the following in the patient's chart:   . Medical and social history . Use  of alcohol, tobacco or illicit drugs  . Current medications and supplements . Functional ability and status . Nutritional status . Physical activity . Advanced directives . List of other physicians . Hospitalizations, surgeries, and ER visits in previous 12 months . Vitals . Screenings to include cognitive, depression, and falls . Referrals and appointments  In addition, I have reviewed and discussed with patient certain preventive protocols, quality metrics, and best practice recommendations. A written personalized care plan for preventive services as well as general preventive health recommendations were provided to patient.     Barb Merino, LPN   16/03/[id number]   Nurse  Notes:

## 2020-06-08 NOTE — Patient Instructions (Signed)
[name] , Thank you for taking time to come for your Medicare Wellness Visit. I appreciate your ongoing commitment to your health goals. Please review the following plan we discussed and let me know if I can assist you in the future.   Screening recommendations/referrals: Colonoscopy: completed 05/15/2015 Mammogram: completed 12/20/2019, due 12/19/2020 Bone Density: completed 09/27/2015 Recommended yearly ophthalmology/optometry visit for glaucoma screening and checkup Recommended yearly dental visit for hygiene and checkup  Vaccinations: Influenza vaccine: allergy Pneumococcal vaccine: decline Tdap vaccine: allergy Shingles vaccine: discussed   Covid-19: 09/27/2019, 09/02/2019  Advanced directives: Please bring a copy of your POA (Power of Attorney) and/or Living Will to your next appointment.   Conditions/risks identified: none  Next appointment: Follow up in one year for your annual wellness visit    Preventive Care 65 Years and Older, Female Preventive care refers to lifestyle choices and visits with your health care provider that can promote health and wellness. What does preventive care include?  A yearly physical exam. This is also called an annual well check.  Dental exams once or twice a year.  Routine eye exams. Ask your health care provider how often you should have your eyes checked.  Personal lifestyle choices, including:  Daily care of your teeth and gums.  Regular physical activity.  Eating a healthy diet.  Avoiding tobacco and drug use.  Limiting alcohol use.  Practicing safe sex.  Taking low-dose aspirin every day.  Taking vitamin and mineral supplements as recommended by your health care provider. What happens during an annual well check? The services and screenings done by your health care provider during your annual well check will depend on your age, overall health, lifestyle risk factors, and family history of disease. Counseling  Your health  care provider may ask you questions about your:  Alcohol use.  Tobacco use.  Drug use.  Emotional well-being.  Home and relationship well-being.  Sexual activity.  Eating habits.  History of falls.  Memory and ability to understand (cognition).  Work and work Astronomer.  Reproductive health. Screening  You may have the following tests or measurements:  Height, weight, and BMI.  Blood pressure.  Lipid and cholesterol levels. These may be checked every 5 years, or more frequently if you are over 41 years old.  Skin check.  Lung cancer screening. You may have this screening every year starting at age 66 if you have a 30-pack-year history of smoking and currently smoke or have quit within the past 15 years.  Fecal occult blood test (FOBT) of the stool. You may have this test every year starting at age 36.  Flexible sigmoidoscopy or colonoscopy. You may have a sigmoidoscopy every 5 years or a colonoscopy every 10 years starting at age 90.  Hepatitis C blood test.  Hepatitis B blood test.  Sexually transmitted disease (STD) testing.  Diabetes screening. This is done by checking your blood sugar (glucose) after you have not eaten for a while (fasting). You may have this done every 1-3 years.  Bone density scan. This is done to screen for osteoporosis. You may have this done starting at age 24.  Mammogram. This may be done every 1-2 years. Talk to your health care provider about how often you should have regular mammograms. Talk with your health care provider about your test results, treatment options, and if necessary, the need for more tests. Vaccines  Your health care provider may recommend certain vaccines, such as:  Influenza vaccine. This is recommended every year.  Tetanus, diphtheria, and acellular pertussis (Tdap, Td) vaccine. You may need a Td booster every 10 years.  Zoster vaccine. You may need this after age 66.  Pneumococcal 13-valent conjugate  (PCV13) vaccine. One dose is recommended after age 57.  Pneumococcal polysaccharide (PPSV23) vaccine. One dose is recommended after age 36. Talk to your health care provider about which screenings and vaccines you need and how often you need them. This information is not intended to replace advice given to you by your health care provider. Make sure you discuss any questions you have with your health care provider. Document Released: 07/07/2015 Document Revised: 02/28/2016 Document Reviewed: 04/11/2015 Elsevier Interactive Patient Education  2017 [place] Prevention in the Home Falls can cause injuries. They can happen to people of all ages. There are many things you can do to make your home safe and to help prevent falls. What can I do on the outside of my home?  Regularly fix the edges of walkways and driveways and fix any cracks.  Remove anything that might make you trip as you walk through a door, such as a raised step or threshold.  Trim any bushes or trees on the path to your home.  Use bright outdoor lighting.  Clear any walking paths of anything that might make someone trip, such as rocks or tools.  Regularly check to see if handrails are loose or broken. Make sure that both sides of any steps have handrails.  Any raised decks and porches should have guardrails on the edges.  Have any leaves, snow, or ice cleared regularly.  Use sand or salt on walking paths during winter.  Clean up any spills in your garage right away. This includes oil or grease spills. What can I do in the bathroom?  Use night lights.  Install grab bars by the toilet and in the tub and shower. Do not use towel bars as grab bars.  Use non-skid mats or decals in the tub or shower.  If you need to sit down in the shower, use a plastic, non-slip stool.  Keep the floor dry. Clean up any water that spills on the floor as soon as it happens.  Remove soap buildup in the tub or shower  regularly.  Attach bath mats securely with double-sided non-slip rug tape.  Do not have throw rugs and other things on the floor that can make you trip. What can I do in the bedroom?  Use night lights.  Make sure that you have a light by your bed that is easy to reach.  Do not use any sheets or blankets that are too big for your bed. They should not hang down onto the floor.  Have a firm chair that has side arms. You can use this for support while you get dressed.  Do not have throw rugs and other things on the floor that can make you trip. What can I do in the kitchen?  Clean up any spills right away.  Avoid walking on wet floors.  Keep items that you use a lot in easy-to-reach places.  If you need to reach something above you, use a strong step stool that has a grab bar.  Keep electrical cords out of the way.  Do not use floor polish or wax that makes floors slippery. If you must use wax, use non-skid floor wax.  Do not have throw rugs and other things on the floor that can make you trip. What can I do  with my stairs?  Do not leave any items on the stairs.  Make sure that there are handrails on both sides of the stairs and use them. Fix handrails that are broken or loose. Make sure that handrails are as long as the stairways.  Check any carpeting to make sure that it is firmly attached to the stairs. Fix any carpet that is loose or worn.  Avoid having throw rugs at the top or bottom of the stairs. If you do have throw rugs, attach them to the floor with carpet tape.  Make sure that you have a light switch at the top of the stairs and the bottom of the stairs. If you do not have them, ask someone to add them for you. What else can I do to help prevent falls?  Wear shoes that:  Do not have high heels.  Have rubber bottoms.  Are comfortable and fit you well.  Are closed at the toe. Do not wear sandals.  If you use a stepladder:  Make sure that it is fully  opened. Do not climb a closed stepladder.  Make sure that both sides of the stepladder are locked into place.  Ask someone to hold it for you, if possible.  Clearly mark and make sure that you can see:  Any grab bars or handrails.  First and last steps.  Where the edge of each step is.  Use tools that help you move around (mobility aids) if they are needed. These include:  Canes.  Walkers.  Scooters.  Crutches.  Turn on the lights when you go into a dark area. Replace any light bulbs as soon as they burn out.  Set up your furniture so you have a clear path. Avoid moving your furniture around.  If any of your floors are uneven, fix them.  If there are any pets around you, be aware of where they are.  Review your medicines with your doctor. Some medicines can make you feel dizzy. This can increase your chance of falling. Ask your doctor what other things that you can do to help prevent falls. This information is not intended to replace advice given to you by your health care provider. Make sure you discuss any questions you have with your health care provider. Document Released: 04/06/2009 Document Revised: 11/16/2015 Document Reviewed: 07/15/2014 Elsevier Interactive Patient Education  2017 [employer].

## 2020-06-12 ENCOUNTER — Telehealth: Payer: Self-pay

## 2020-06-12 ENCOUNTER — Other Ambulatory Visit: Payer: Self-pay

## 2020-06-12 ENCOUNTER — Ambulatory Visit (INDEPENDENT_AMBULATORY_CARE_PROVIDER_SITE_OTHER): Payer: Medicare HMO | Admitting: Orthotics

## 2020-06-12 DIAGNOSIS — M2011 Hallux valgus (acquired), right foot: Secondary | ICD-10-CM

## 2020-06-12 DIAGNOSIS — E669 Obesity, unspecified: Secondary | ICD-10-CM

## 2020-06-12 DIAGNOSIS — E1169 Type 2 diabetes mellitus with other specified complication: Secondary | ICD-10-CM

## 2020-06-12 NOTE — Chronic Care Management (AMB) (Signed)
Chronic Care Management Pharmacy Assistant   Name: [name]  MRN: [id number] DOB: 15-Jan-1946  Reason for Encounter: Disease State  - Diabetes and Hypertension Adherence Call.  Patient Questions:   1.  Have you seen any other providers since your last visit? Yes. 03/27/2020 ED- Acute Otitis externa or right ear. Urgent Care 04/03/2020 Initial Consult - Osborn Coho (Otolaryngology) 04/07/2020 Fungal Otitis externa - Scot Jun (Otolaryngology) 05/04/2020 Scot Jun (Otolaryngology) 04/10/2020 Dorothyann Peng, MD(PCP) DM, HTN, HLD. 04/13/2020 Willis Modena (Gastroenterology) 04/19/2020 Nicholes Rough DPM (Podiatry) 12/022021 Yates Decamp, MD Cardiology    2.  Any changes in your medicines or health? No     PCP : Dorothyann Peng, MD  Allergies:   Allergies  Allergen Reactions  . Pravastatin Hives  . Haemophilus Influenzae Vaccines     Nausea, pain, body weakness  . Sulfa Antibiotics Itching  . Sulfonamide Derivatives Itching  . Tetanus Toxoids Swelling    Arm swelled at injection site    Medications: Outpatient Encounter Medications as of 06/12/2020  Medication Sig  . acetic acid-hydrocortisone (VOSOL-HC) OTIC solution 4 drops in the right ear nightly for 2 weeks then as needed for itching.  . Ascorbic Acid (VITAMIN C) 1000 MG tablet Take 1,000 mg by mouth daily.  Marland Kitchen aspirin EC 81 MG tablet Take 1 tablet (81 mg total) by mouth 2 (two) times daily. (Patient taking differently: Take 81 mg by mouth every other day.)  . Bacillus Coagulans-Inulin (PROBIOTIC FORMULA PO) Take 30 mg by mouth daily.   . carvedilol (COREG) 3.125 MG tablet carvedilol 3.125 mg tablet  Take 1 tablet twice a day by oral route.  . cetirizine (ZYRTEC) 10 MG tablet Take 10 mg by mouth daily as needed for allergies.  (Patient not taking: Reported on 06/08/2020)  . Cholecalciferol (VITAMIN D3) 50 MCG (2000 UT) capsule Take 1 capsule by mouth daily at 12 noon.   . clotrimazole-betamethasone  (LOTRISONE) cream Apply topically.  . Coenzyme Q10 (COQ10) 100 MG CAPS Take 100 mg by mouth daily with lunch.   . estradiol (ESTRACE) 0.1 MG/GM vaginal cream estradiol 0.01% (0.1 mg/gram) vaginal cream  USE A SMALL PEA SIZED AMOUNT ON VULVA DAILY FOR 2 WEEKS  . famotidine (PEPCID) 20 MG tablet Take 20 mg by mouth 2 (two) times daily. As needed  . fluticasone (FLONASE) 50 MCG/ACT nasal spray Place 1 spray into both nostrils daily. (Patient taking differently: Place 1 spray into both nostrils daily as needed for allergies. )  . furosemide (LASIX) 20 MG tablet Take 1 tablet (20 mg total) by mouth daily as needed (fluid).  . hydrochlorothiazide (MICROZIDE) 12.5 MG capsule Take 12.5 mg by mouth daily as needed (excess fluid/swelling).   Marland Kitchen loratadine (CLARITIN) 10 MG tablet Take 10 mg by mouth daily.  Marland Kitchen losartan (COZAAR) 25 MG tablet Take 1 tablet (25 mg total) by mouth 2 (two) times a week. Take one tablet (25 mg) by mouth twice weekly - Tuesdays and Thursdays  . meloxicam (MOBIC) 15 MG tablet Take 15 mg by mouth daily as needed.   . metFORMIN (GLUCOPHAGE) 500 MG tablet Take 1 tablet (500 mg total) by mouth 2 (two) times daily with a meal.  . potassium chloride SA (K-DUR) 20 MEQ tablet Take 1 tablet (20 mEq total) by mouth daily as needed (with each dose of hydrochlorothiazide). (Patient taking differently: Take 20 mEq by mouth every morning.)  . rosuvastatin (CRESTOR) 20 MG tablet Take 1 tablet (20 mg total) by mouth daily.  No facility-administered encounter medications on file as of 06/12/2020.    Current Diagnosis: Patient Active Problem List   Diagnosis Date Noted  . S/P TKR (total knee replacement), right 04/19/2019  . Osteoarthritis of right knee 04/16/2019  . Obesity, diabetes, and hypertension syndrome (HCC) 06/08/2018  . Bradycardia 06/08/2018  . Chronic fatigue 06/08/2018  . PVC (premature ventricular contraction) 08/03/2017  . Costochondritis 08/02/2017  . DOE (dyspnea on  exertion) 08/01/2017  . Chest pain 05/28/2016  . Essential hypertension 05/28/2016  . Diabetes mellitus type 2 in obese (HCC) 05/28/2016  . UPPER RESPIRATORY INFECTION, VIRAL 03/22/2009  . ALLERGIC RHINITIS CAUSE UNSPECIFIED 11/04/2008  . Hyperlipidemia 04/11/2008  . COUGH 04/11/2008  . OBSTRUCTIVE SLEEP APNEA 03/21/2008  . CARPAL TUNNEL RELEASE, BILATERAL, HX OF 03/21/2008    . Current antihyperglycemic regimen:  Metformin 500 mg twice daily with a meal  . What recent interventions/DTPs have been made to improve glycemic control:  o Patient states she takes her medications as directed by provider.  . Have there been any recent hospitalizations or ED visits since last visit with CPP? No   . Patient denies hypoglycemic symptoms, including Pale, Sweaty, Shaky, Hungry, Nervous/irritable and Vision changes . Patient denies hyperglycemic symptoms, including blurry vision, excessive thirst, fatigue, polyuria and weakness  . How often are you checking your blood sugar? [place] - Check blood sugar every 2-3 days. Patient states she is taking her blood sugars once a day and some times twice a day.  . What are your blood sugars ranging? Patient states her blood sugar readings are in where between 78-100 fasting o Fasting: None o Before meals:None o After meals: None o Bedtime: None . During the week, how often does your blood glucose drop below 70? No  . Are you checking your feet daily/regularly? Yes,   Adherence Review: Is the patient currently on a STATIN medication? Yes Is the patient currently on ACE/ARB medication? Yes Does the patient have >5 day gap between last estimated fill dates? No   Reviewed chart prior to disease state call. Spoke with patient regarding BP  Recent Office Vitals: BP Readings from Last 3 Encounters:  05/25/20 132/74  04/10/20 124/78  03/27/20 (!) 147/78   Pulse Readings from Last 3 Encounters:  05/25/20 62  04/10/20 (!) 53  03/27/20 (!) 59    Wt  Readings from Last 3 Encounters:  06/08/20 212 lb (96.2 kg)  05/25/20 220 lb (99.8 kg)  04/10/20 215 lb 9.6 oz (97.8 kg)     Kidney Function Lab Results  Component Value Date/Time   CREATININE 0.75 04/10/2020 11:49 AM   CREATININE 0.71 12/09/2019 11:19 AM   GFRNONAA 78 04/10/2020 11:49 AM   GFRAA 90 04/10/2020 11:49 AM    BMP Latest Ref Rng & Units 04/10/2020 12/09/2019 08/10/2019  Glucose 65 - 99 mg/dL 387(F) 643(P) 295(J)  BUN 8 - 27 mg/dL 12 11 12   Creatinine 0.57 - 1.00 mg/dL 8.84 1.66  BUN/Creat Ratio 12 - 28 16 15 17   Sodium 134 - 144 mmol/L 144 143 145(H)  Potassium 3.5 - 5.2 mmol/L 4.3 4.8 4.2  Chloride 96 - 106 mmol/L 105 107(H) 107(H)  CO2 20 - 29 mmol/L 26 24 24   Calcium 8.7 - 10.3 mg/dL 9.5 9.2 9.3     . Current antihypertensive regimen:  o   Furosemide 20 mg daily as needed  Hydrochlorothiazide 12.5 mg daily as needed  Losartan 25 mg twice weekly  Potassium 20 mEq daily as needed with  HCTZ  . How often are you checking your Blood Pressure? Goal - Check BP weekly - Patient states she does not have a blood pressure cuff at this time.  . Current home BP readings: None  . What recent interventions/DTPs have been made by any provider to improve Blood Pressure control since last CPP Visit: Patient states she is taking her medications as directed by provider.  . Any recent hospitalizations or ED visits since last visit with CPP?  No   . What diet changes have been made to improve Blood Pressure Control?  o Goal - Ensure daily salt intake < 2300 mg/day o Patient states she is watching her salt intake and eating healthy . What exercise is being done to improve your Blood Pressure Control?  Patient states she is active, she does not have a exercise regimen.  Adherence Review: Is the patient currently on ACE/ARB medication? Yes   Does the patient have >5 day gap between last estimated fill dates? No    Goals Addressed            This Visit's  Progress   . Pharmacy Care Plan   On track    CARE PLAN ENTRY (see longitudinal plan of care for additional care plan information)  Current Barriers:  . Chronic Disease Management support, education, and care coordination needs related to Hypertension, Hyperlipidemia, and Diabetes   Hypertension BP Readings from Last 3 Encounters:  12/09/19 110/76  10/07/19 140/80  08/10/19 126/80   . Pharmacist Clinical Goal(s): o Over the next 180 days, patient will work with PharmD and providers to maintain BP goal <130/80 . Current regimen:   Furosemide 20mg  daily as needed  Hydrochlorothiazide 12.5mg  daily as needed  Losartan 25mg  twice weekly  Potassium 20mEq daily as needed with HCTZ . Interventions: o Provided dietary and exercise recommendations . Patient self care activities - Over the next 180 days, patient will: o Check BP weekly, document, and provide at future appointments o Obtain a blood pressure monitor from insurance o Ensure daily salt intake < 2300 mg/day  Hyperlipidemia Lab Results  Component Value Date/Time   LDLCALC 117 (H) 08/10/2019 10:50 AM   . Pharmacist Clinical Goal(s): o Over the next 90 days, patient will work with PharmD and providers to achieve LDL goal < 70 . Current regimen:  . Pravastatin 80mg  twice weekly . CoQ10 100mg  daily . Interventions: o Provided dietary and exercise recommendations o Discussed prior statin therapy (patient does not recall any) o Monitor next lipid panel. Consider increasing statin if LDL still elevated . Patient self care activities - Over the next 90 days, patient will: o Take cholesterol medication daily as directed o Try water aerobics  Diabetes Lab Results  Component Value Date/Time   HGBA1C 5.9 (H) 12/09/2019 11:19 AM   HGBA1C 6.1 (H) 08/10/2019 10:50 AM   . Pharmacist Clinical Goal(s): o Over the next 180 days, patient will work with PharmD and providers to maintain A1c goal <7% . Current regimen:   o Metformin 500mg  twice daily with a meal . Interventions: o Provided dietary and exercise recommendations . Patient self care activities - Over the next 180 days, patient will: o Check blood sugar every 2-3 days, document, and provide at future appointments o Contact provider with any episodes of hypoglycemia o Try water aerobics  Medication management . Pharmacist Clinical Goal(s): o Over the next 90 days, patient will work with PharmD and providers to maintain optimal medication adherence . Current pharmacy: [facility] .  Interventions o Comprehensive medication review performed. o Continue current medication management strategy . Patient self care activities - Over the next 90 days, patient will: o Focus on medication adherence by continued use of pill box o Take medications as prescribed o Report any questions or concerns to PharmD and/or provider(s)  Initial goal documentation        Follow-Up:  Pharmacist Review -  Patient states she has an appointment with Dr. Allyne Gee in January 2022. Patient states that she has noticed a couple of moles on head and would like to have Dr. Allyne Gee look at them at appointment.   Cherylin Mylar , CPP notified.  Jon Gills, [facility] Clinical Pharmacist Assistant [phone]

## 2020-06-12 NOTE — Progress Notes (Signed)

## 2020-06-26 ENCOUNTER — Encounter: Payer: Self-pay | Admitting: Internal Medicine

## 2020-06-26 ENCOUNTER — Ambulatory Visit (INDEPENDENT_AMBULATORY_CARE_PROVIDER_SITE_OTHER): Payer: Medicare HMO | Admitting: Internal Medicine

## 2020-06-26 ENCOUNTER — Other Ambulatory Visit: Payer: Self-pay

## 2020-06-26 VITALS — BP 132/70 | HR 60 | Temp 98.3°F | Ht 62.8 in | Wt 218.8 lb

## 2020-06-26 DIAGNOSIS — Z0000 Encounter for general adult medical examination without abnormal findings: Secondary | ICD-10-CM

## 2020-06-26 DIAGNOSIS — L989 Disorder of the skin and subcutaneous tissue, unspecified: Secondary | ICD-10-CM

## 2020-06-26 DIAGNOSIS — I700 Atherosclerosis of aorta: Secondary | ICD-10-CM | POA: Diagnosis not present

## 2020-06-26 DIAGNOSIS — E1165 Type 2 diabetes mellitus with hyperglycemia: Secondary | ICD-10-CM | POA: Diagnosis not present

## 2020-06-26 DIAGNOSIS — I10 Essential (primary) hypertension: Secondary | ICD-10-CM | POA: Diagnosis not present

## 2020-06-26 DIAGNOSIS — Z6839 Body mass index (BMI) 39.0-39.9, adult: Secondary | ICD-10-CM | POA: Diagnosis not present

## 2020-06-26 DIAGNOSIS — N390 Urinary tract infection, site not specified: Secondary | ICD-10-CM | POA: Diagnosis not present

## 2020-06-26 LAB — POCT URINALYSIS DIPSTICK
Bilirubin, UA: NEGATIVE
Glucose, UA: NEGATIVE
Ketones, UA: NEGATIVE
Nitrite, UA: NEGATIVE
Protein, UA: NEGATIVE
Spec Grav, UA: 1.02 (ref 1.010–1.025)
Urobilinogen, UA: 0.2 E.U./dL
pH, UA: 6 (ref 5.0–8.0)

## 2020-06-26 LAB — POCT UA - MICROALBUMIN
Albumin/Creatinine Ratio, Urine, POC: <30
Creatinine, POC: 200 mg/dL
Microalbumin Ur, POC: 10 mg/L

## 2020-06-26 MED ORDER — POTASSIUM CHLORIDE CRYS ER 20 MEQ PO TBCR: 20.0000 meq | EXTENDED_RELEASE_TABLET | Freq: Every morning | ORAL | 1 refills | Status: DC

## 2020-06-26 NOTE — Patient Instructions (Signed)
Health Maintenance, Female Adopting a healthy lifestyle and getting preventive care are important in promoting health and wellness. Ask your health care provider about:  The right schedule for you to have regular tests and exams.  Things you can do on your own to prevent diseases and keep yourself healthy. What should I know about diet, weight, and exercise? Eat a healthy diet   Eat a diet that includes plenty of vegetables, fruits, low-fat dairy products, and lean protein.  Do not eat a lot of foods that are high in solid fats, added sugars, or sodium. Maintain a healthy weight Body mass index (BMI) is used to identify weight problems. It estimates body fat based on height and weight. Your health care provider can help determine your BMI and help you achieve or maintain a healthy weight. Get regular exercise Get regular exercise. This is one of the most important things you can do for your health. Most adults should:  Exercise for at least 150 minutes each week. The exercise should increase your heart rate and make you sweat (moderate-intensity exercise).  Do strengthening exercises at least twice a week. This is in addition to the moderate-intensity exercise.  Spend less time sitting. Even light physical activity can be beneficial. Watch cholesterol and blood lipids Have your blood tested for lipids and cholesterol at 76 years of age, then have this test every 5 years. Have your cholesterol levels checked more often if:  Your lipid or cholesterol levels are high.  You are older than 76 years of age.  You are at high risk for heart disease. What should I know about cancer screening? Depending on your health history and family history, you may need to have cancer screening at various ages. This may include screening for:  Breast cancer.  Cervical cancer.  Colorectal cancer.  Skin cancer.  Lung cancer. What should I know about heart disease, diabetes, and high blood  pressure? Blood pressure and heart disease  High blood pressure causes heart disease and increases the risk of stroke. This is more likely to develop in people who have high blood pressure readings, are of African descent, or are overweight.  Have your blood pressure checked: ? Every 3-5 years if you are 18-39 years of age. ? Every year if you are 40 years old or older. Diabetes Have regular diabetes screenings. This checks your fasting blood sugar level. Have the screening done:  Once every three years after age 40 if you are at a normal weight and have a low risk for diabetes.  More often and at a younger age if you are overweight or have a high risk for diabetes. What should I know about preventing infection? Hepatitis B If you have a higher risk for hepatitis B, you should be screened for this virus. Talk with your health care provider to find out if you are at risk for hepatitis B infection. Hepatitis C Testing is recommended for:  Everyone born from 1945 through 1965.  Anyone with known risk factors for hepatitis C. Sexually transmitted infections (STIs)  Get screened for STIs, including gonorrhea and chlamydia, if: ? You are sexually active and are younger than 76 years of age. ? You are older than 76 years of age and your health care provider tells you that you are at risk for this type of infection. ? Your sexual activity has changed since you were last screened, and you are at increased risk for chlamydia or gonorrhea. Ask your health care provider if   you are at risk.  Ask your health care provider about whether you are at high risk for HIV. Your health care provider may recommend a prescription medicine to help prevent HIV infection. If you choose to take medicine to prevent HIV, you should first get tested for HIV. You should then be tested every 3 months for as long as you are taking the medicine. Pregnancy  If you are about to stop having your period (premenopausal) and  you may become pregnant, seek counseling before you get pregnant.  Take 400 to 800 micrograms (mcg) of folic acid every day if you become pregnant.  Ask for birth control (contraception) if you want to prevent pregnancy. Osteoporosis and menopause Osteoporosis is a disease in which the bones lose minerals and strength with aging. This can result in bone fractures. If you are 65 years old or older, or if you are at risk for osteoporosis and fractures, ask your health care provider if you should:  Be screened for bone loss.  Take a calcium or vitamin D supplement to lower your risk of fractures.  Be given hormone replacement therapy (HRT) to treat symptoms of menopause. Follow these instructions at home: Lifestyle  Do not use any products that contain nicotine or tobacco, such as cigarettes, e-cigarettes, and chewing tobacco. If you need help quitting, ask your health care provider.  Do not use street drugs.  Do not share needles.  Ask your health care provider for help if you need support or information about quitting drugs. Alcohol use  Do not drink alcohol if: ? Your health care provider tells you not to drink. ? You are pregnant, may be pregnant, or are planning to become pregnant.  If you drink alcohol: ? Limit how much you use to 0-1 drink a day. ? Limit intake if you are breastfeeding.  Be aware of how much alcohol is in your drink. In the [place] one drink equals one 12 oz bottle of beer (355 mL), one 5 oz glass of wine (148 mL), or one 1 oz glass of hard liquor (44 mL). General instructions  Schedule regular health, dental, and eye exams.  Stay current with your vaccines.  Tell your health care provider if: ? You often feel depressed. ? You have ever been abused or do not feel safe at home. Summary  Adopting a healthy lifestyle and getting preventive care are important in promoting health and wellness.  Follow your health care provider's instructions about healthy  diet, exercising, and getting tested or screened for diseases.  Follow your health care provider's instructions on monitoring your cholesterol and blood pressure. This information is not intended to replace advice given to you by your health care provider. Make sure you discuss any questions you have with your health care provider. Document Revised: 06/03/2018 Document Reviewed: 06/03/2018 Elsevier Patient Education  2020 [employer] Inc[employer]  

## 2020-06-26 NOTE — Progress Notes (Signed)
I,Tianna Badgett,acting as a Education administrator for [name], MD.,have documented all relevant documentation on the behalf of [name], MD,as directed by  [name], MD while in the presence of [name], MD.  This visit occurred during the SARS-CoV-2 public health emergency.  Safety protocols were in place, including screening questions prior to the visit, additional usage of staff PPE, and extensive cleaning of exam room while observing appropriate contact time as indicated for disinfecting solutions.  Subjective:     Patient ID: [name] , female    DOB: 1946-04-08 , 75 y.o.   MRN: [id number]   Chief Complaint  Patient presents with  . Annual Exam  . Diabetes  . Hypertension    HPI  She is here today for a full physical examination. She is followed by GYN for her pelvic exams. She has no specific concerns or complaints at this time.   Hypertension This is a chronic problem. The current episode started more than 1 year ago. The problem has been gradually improving since onset. The problem is controlled. Pertinent negatives include no blurred vision, chest pain, palpitations or shortness of breath. Risk factors for coronary artery disease include diabetes mellitus, dyslipidemia, obesity, post-menopausal state and sedentary lifestyle. The current treatment provides moderate improvement. Compliance problems include exercise.   Diabetes She presents for her follow-up diabetic visit. She has type 2 diabetes mellitus. Her disease course has been stable. There are no hypoglycemic associated symptoms. Tremors:  Pertinent negatives for diabetes include no blurred vision and no chest pain. There are no hypoglycemic complications. Risk factors for coronary artery disease include dyslipidemia, diabetes mellitus, hypertension, obesity, post-menopausal and sedentary lifestyle. She is following a diabetic diet. She participates in exercise intermittently. Her breakfast blood glucose  is taken between 8-9 am. Her breakfast blood glucose range is generally 110-130 mg/dl. An ACE inhibitor/angiotensin II receptor blocker is being taken. Eye exam is current.     Past Medical History:  Diagnosis Date  . Bladder infection   . Carpal tunnel syndrome   . Complication of anesthesia    Difficult to arouse  . Diabetes mellitus   . Hypertension   . Kidney stone   . Kidney stones   . Sleep apnea      Family History  Problem Relation Age of Onset  . Heart attack Father 56  . Stroke Father 22  . Diabetes Father   . Diabetes Brother   . Diabetes Brother   . Breast cancer Sister 2  . Diabetes Mother   . Hypertension Mother      Current Outpatient Medications:  .  Ascorbic Acid (VITAMIN C) 1000 MG tablet, Take 1,000 mg by mouth daily., Disp: , Rfl:  .  aspirin EC 81 MG tablet, Take 1 tablet (81 mg total) by mouth 2 (two) times daily. (Patient taking differently: Take 81 mg by mouth every other day.), Disp: 60 tablet, Rfl: 0 .  Bacillus Coagulans-Inulin (PROBIOTIC FORMULA PO), Take 30 mg by mouth daily. , Disp: , Rfl:  .  carvedilol (COREG) 3.125 MG tablet, carvedilol 3.125 mg tablet  Take 1 tablet twice a day by oral route., Disp: , Rfl:  .  cetirizine (ZYRTEC) 10 MG tablet, Take 10 mg by mouth daily as needed for allergies., Disp: , Rfl:  .  Cholecalciferol (VITAMIN D3) 50 MCG (2000 UT) capsule, Take 1 capsule by mouth daily at 12 noon. , Disp: , Rfl:  .  clotrimazole-betamethasone (LOTRISONE) cream, Apply topically., Disp: ,  Rfl:  .  Coenzyme Q10 (COQ10) 100 MG CAPS, Take 100 mg by mouth daily with lunch. , Disp: , Rfl:  .  estradiol (ESTRACE) 0.1 MG/GM vaginal cream, estradiol 0.01% (0.1 mg/gram) vaginal cream  USE A SMALL PEA SIZED AMOUNT ON VULVA DAILY FOR 2 WEEKS, Disp: , Rfl:  .  fluticasone (FLONASE) 50 MCG/ACT nasal spray, Place 1 spray into both nostrils daily. (Patient taking differently: Place 1 spray into both nostrils daily as needed for allergies.), Disp: 16  g, Rfl: 2 .  furosemide (LASIX) 20 MG tablet, Take 1 tablet (20 mg total) by mouth daily as needed (fluid)., Disp: 90 tablet, Rfl: 1 .  hydrochlorothiazide (MICROZIDE) 12.5 MG capsule, Take 12.5 mg by mouth daily as needed (excess fluid/swelling). , Disp: , Rfl:  .  loratadine (CLARITIN) 10 MG tablet, Take 10 mg by mouth daily., Disp: , Rfl:  .  losartan (COZAAR) 25 MG tablet, Take 1 tablet (25 mg total) by mouth 2 (two) times a week. Take one tablet (25 mg) by mouth twice weekly - Tuesdays and Thursdays, Disp: 36 tablet, Rfl: 3 .  meloxicam (MOBIC) 15 MG tablet, Take 15 mg by mouth daily as needed. , Disp: , Rfl:  .  metFORMIN (GLUCOPHAGE) 500 MG tablet, Take 1 tablet (500 mg total) by mouth 2 (two) times daily with a meal., Disp: 180 tablet, Rfl: 1 .  potassium chloride SA (K-DUR) 20 MEQ tablet, Take 1 tablet (20 mEq total) by mouth daily as needed (with each dose of hydrochlorothiazide). (Patient taking differently: Take 20 mEq by mouth every morning.), Disp: 90 tablet, Rfl: 2 .  rosuvastatin (CRESTOR) 20 MG tablet, Take 1 tablet (20 mg total) by mouth daily., Disp: 90 tablet, Rfl: 3 .  acetic acid-hydrocortisone (VOSOL-HC) OTIC solution, 4 drops in the right ear nightly for 2 weeks then as needed for itching., Disp: , Rfl:    Allergies  Allergen Reactions  . Pravastatin Hives  . Haemophilus Influenzae Vaccines     Nausea, pain, body weakness  . Sulfa Antibiotics Itching  . Sulfonamide Derivatives Itching  . Tetanus Toxoids Swelling    Arm swelled at injection site      The patient states she uses none for birth control. Last LMP was No LMP recorded. Patient has had a hysterectomy.. Negative for Dysmenorrhea. Negative for: breast discharge, breast lump(s), breast pain and breast self exam. Associated symptoms include abnormal vaginal bleeding. Pertinent negatives include abnormal bleeding (hematology), anxiety, decreased libido, depression, difficulty falling sleep, dyspareunia, history of  infertility, nocturia, sexual dysfunction, sleep disturbances, urinary incontinence, urinary urgency, vaginal discharge and vaginal itching. Diet regular.The patient states her exercise level is  intermittent.   . The patient's tobacco use is:  Social History   Tobacco Use  Smoking Status Never Smoker  Smokeless Tobacco Never Used  . She has been exposed to passive smoke. The patient's alcohol use is:  Social History   Substance and Sexual Activity  Alcohol Use No    Review of Systems  Constitutional: Negative.   HENT: Negative.   Eyes: Negative.  Negative for blurred vision.  Respiratory: Negative.  Negative for shortness of breath.   Cardiovascular: Negative.  Negative for chest pain and palpitations.  Gastrointestinal: Negative.   Endocrine: Negative.   Genitourinary: Negative.   Musculoskeletal: Negative.   Skin: Negative.        She c/o itchy lesions in her scalp. She is unable to scratch lesion off. Not sure what triggers her sx. Denies change  in hairstyling products/shampoos.   Allergic/Immunologic: Negative.   Neurological: Negative.  Tremors:   Hematological: Negative.   Psychiatric/Behavioral: Negative.      Today's Vitals   06/26/20 1043  BP: 132/70  Pulse: 60  Temp: 98.3 F (36.8 C)  TempSrc: Oral  Weight: 218 lb 12.8 oz (99.2 kg)  Height: 5' 2.8" (1.595 m)   Body mass index is 39.01 kg/m.   Objective:  Physical Exam Vitals and nursing note reviewed.  Constitutional:      Appearance: Normal appearance. She is obese.  HENT:     Head: Normocephalic and atraumatic.     Right Ear: Tympanic membrane, ear canal and external ear normal.     Left Ear: Tympanic membrane, ear canal and external ear normal.     Nose:     Comments: Deferred, masked    Mouth/Throat:     Comments: Deferred, masked Eyes:     Extraocular Movements: Extraocular movements intact.     Conjunctiva/sclera: Conjunctivae normal.     Pupils: Pupils are equal, round, and reactive to  light.  Cardiovascular:     Rate and Rhythm: Normal rate and regular rhythm.     Pulses: Normal pulses.     Heart sounds: Normal heart sounds.  Pulmonary:     Effort: Pulmonary effort is normal.     Breath sounds: Normal breath sounds.  Chest:  Breasts:     Tanner Score is 5.     Right: Normal.     Left: Normal.    Abdominal:     General: Bowel sounds are normal.     Palpations: Abdomen is soft.     Comments: Obese, soft  Genitourinary:    Comments: deferred Musculoskeletal:        General: Normal range of motion.     Cervical back: Normal range of motion and neck supple.  Feet:     Comments: Deferred Skin:    General: Skin is warm and dry.     Comments: Hyperpigmented lesion in crown of head. No surrounding erythema noted. No vesicular lesions noted.   Neurological:     General: No focal deficit present.     Mental Status: She is alert and oriented to person, place, and time.  Psychiatric:        Mood and Affect: Mood normal.        Behavior: Behavior normal.         Assessment And Plan:     1. Routine general medical examination at health care facility Comments: A full exam was performed. Importance of monthly self breast exams was discussed with the patient. I will check labs as listed below. PATIENT IS ADVISED TO GET 30-45 MINUTES REGULAR EXERCISE NO LESS THAN FOUR TO FIVE DAYS PER WEEK - BOTH WEIGHTBEARING EXERCISES AND AEROBIC ARE RECOMMENDED.  PATIENT IS ADVISED TO FOLLOW A HEALTHY DIET WITH AT LEAST SIX FRUITS/VEGGIES PER DAY, DECREASE INTAKE OF RED MEAT, AND TO INCREASE FISH INTAKE TO TWO DAYS PER WEEK.  MEATS/FISH SHOULD NOT BE FRIED, BAKED OR BROILED IS PREFERABLE.  I SUGGEST WEARING SPF 50 SUNSCREEN ON EXPOSED PARTS AND ESPECIALLY WHEN IN THE DIRECT SUNLIGHT FOR AN EXTENDED PERIOD OF TIME.  PLEASE AVOID FAST FOOD RESTAURANTS AND INCREASE YOUR WATER INTAKE.  2. Uncontrolled type 2 diabetes mellitus with hyperglycemia ([place]) Comments: Diabetic foot exam was not  performed, she was seen w/in past 4 weeks w/ podiatry.  I will perform foot exam at her next visit. She will f/u in  four months for re-evaluation. I DISCUSSED WITH THE PATIENT AT LENGTH REGARDING THE GOALS OF GLYCEMIC CONTROL AND POSSIBLE LONG-TERM COMPLICATIONS.  I  ALSO STRESSED THE IMPORTANCE OF COMPLIANCE WITH HOME GLUCOSE MONITORING, DIETARY RESTRICTIONS INCLUDING AVOIDANCE OF SUGARY DRINKS/PROCESSED FOODS,  ALONG WITH REGULAR EXERCISE.  I  ALSO STRESSED THE IMPORTANCE OF ANNUAL EYE EXAMS, SELF FOOT CARE AND COMPLIANCE WITH OFFICE VISITS. - CBC - Hemoglobin A1c - CMP14+EGFR - POCT Urinalysis Dipstick (81002) - POCT UA - Microalbumin  3. Essential hypertension, benign Comments: Chronic, controlled. EKG not performed, previous one from Sept 2021 was reviewed. She will c/w current meds.  Encouraged to follow low sodium diet.   4. Aortic atherosclerosis ([place]) Comments: Chronic, encouraged to follow heart healthy lifestyle. Importance of statin compliance was discussed with patient as well.   5. Lesion of skin of scalp Comments: She agrees to [facility] referral for biopsy/possible removal.  - Ambulatory referral to Dermatology  6. Recurrent UTI Comments: Urinalysis is abnormal. I will send off for culture. She is also followed by Urology.  - Culture, Urine  7. Class 2 severe obesity due to excess calories with serious comorbidity and body mass index (BMI) of 39.0 to 39.9 in adult [facility]) Comments: She is encouraged to strive for BMI less than 30 to decrease cardiac risk. Encouraged to incorporate more chair exercises into her daily routine.   Patient was given opportunity to ask questions. Patient verbalized understanding of the plan and was able to repeat key elements of the plan. All questions were answered to their satisfaction.  [name], MD   I, [name], MD, have reviewed all documentation for this visit. The documentation on 06/26/20 for the exam, diagnosis, procedures, and  orders are all accurate and complete.  THE PATIENT IS ENCOURAGED TO PRACTICE SOCIAL DISTANCING DUE TO THE COVID-19 PANDEMIC.

## 2020-06-27 LAB — CMP14+EGFR
ALT: 13 IU/L (ref 0–32)
AST: 14 IU/L (ref 0–40)
Albumin/Globulin Ratio: 1.7 (ref 1.2–2.2)
Albumin: 4.2 g/dL (ref 3.7–4.7)
Alkaline Phosphatase: 100 IU/L (ref 44–121)
BUN/Creatinine Ratio: 17 (ref 12–28)
BUN: 12 mg/dL (ref 8–27)
Bilirubin Total: 0.3 mg/dL (ref 0.0–1.2)
CO2: 25 mmol/L (ref 20–29)
Calcium: 9.4 mg/dL (ref 8.7–10.3)
Chloride: 108 mmol/L — ABNORMAL HIGH (ref 96–106)
Creatinine, Ser: 0.72 mg/dL (ref 0.57–1.00)
GFR calc Af Amer: 95 mL/min/{1.73_m2} (ref >59)
GFR calc non Af Amer: 82 mL/min/{1.73_m2} (ref >59)
Globulin, Total: 2.5 g/dL (ref 1.5–4.5)
Glucose: 107 mg/dL — ABNORMAL HIGH (ref 65–99)
Potassium: 4.3 mmol/L (ref 3.5–5.2)
Sodium: 146 mmol/L — ABNORMAL HIGH (ref 134–144)
Total Protein: 6.7 g/dL (ref 6.0–8.5)

## 2020-06-27 LAB — CBC
Hematocrit: 38.4 % (ref 34.0–46.6)
Hemoglobin: 12.2 g/dL (ref 11.1–15.9)
MCH: 28.2 pg (ref 26.6–33.0)
MCHC: 31.8 g/dL (ref 31.5–35.7)
MCV: 89 fL (ref 79–97)
Platelets: 255 10*3/uL (ref 150–450)
RBC: 4.33 x10E6/uL (ref 3.77–5.28)
RDW: 13.3 % (ref 11.7–15.4)
WBC: 6.5 10*3/uL (ref 3.4–10.8)

## 2020-06-27 LAB — URINE CULTURE

## 2020-06-27 LAB — HEMOGLOBIN A1C
Est. average glucose Bld gHb Est-mCnc: 131 mg/dL
Hgb A1c MFr Bld: 6.2 % — ABNORMAL HIGH (ref 4.8–5.6)

## 2020-06-28 ENCOUNTER — Encounter: Payer: Self-pay | Admitting: Internal Medicine

## 2020-06-29 ENCOUNTER — Telehealth: Payer: Self-pay

## 2020-06-29 NOTE — Telephone Encounter (Signed)
Sent urinalysis to [facility] per [facility]

## 2020-06-30 ENCOUNTER — Telehealth: Payer: Self-pay

## 2020-06-30 NOTE — Chronic Care Management (AMB) (Signed)
Chronic Care Management Pharmacy Assistant   Name: [name]  MRN: [id number] DOB: [birth year]  Reason for Encounter: Disease State     PCP : Dorothyann Peng, MD  Allergies:   Allergies  Allergen Reactions   Pravastatin Hives   Haemophilus Influenzae Vaccines     Nausea, pain, body weakness   Sulfa Antibiotics Itching   Sulfonamide Derivatives Itching   Tetanus Toxoids Swelling    Arm swelled at injection site    Medications: Outpatient Encounter Medications as of 06/30/2020  Medication Sig   Ascorbic Acid (VITAMIN C) 1000 MG tablet Take 1,000 mg by mouth daily.   aspirin EC 81 MG tablet Take 1 tablet (81 mg total) by mouth 2 (two) times daily. (Patient taking differently: Take 81 mg by mouth every other day.)   Bacillus Coagulans-Inulin (PROBIOTIC FORMULA PO) Take 30 mg by mouth daily.    carvedilol (COREG) 3.125 MG tablet carvedilol 3.125 mg tablet  Take 1 tablet twice a day by oral route.   cetirizine (ZYRTEC) 10 MG tablet Take 10 mg by mouth daily as needed for allergies.   Cholecalciferol (VITAMIN D3) 50 MCG (2000 UT) capsule Take 1 capsule by mouth daily at 12 noon.    clotrimazole-betamethasone (LOTRISONE) cream Apply topically.   Coenzyme Q10 (COQ10) 100 MG CAPS Take 100 mg by mouth daily with lunch.    estradiol (ESTRACE) 0.1 MG/GM vaginal cream estradiol 0.01% (0.1 mg/gram) vaginal cream  USE A SMALL PEA SIZED AMOUNT ON VULVA DAILY FOR 2 WEEKS   fluticasone (FLONASE) 50 MCG/ACT nasal spray Place 1 spray into both nostrils daily. (Patient taking differently: Place 1 spray into both nostrils daily as needed for allergies.)   furosemide (LASIX) 20 MG tablet Take 1 tablet (20 mg total) by mouth daily as needed (fluid).   hydrochlorothiazide (MICROZIDE) 12.5 MG capsule Take 12.5 mg by mouth daily as needed (excess fluid/swelling).    loratadine (CLARITIN) 10 MG tablet Take 10 mg by mouth daily.   losartan (COZAAR) 25 MG tablet Take 1 tablet (25  mg total) by mouth 2 (two) times a week. Take one tablet (25 mg) by mouth twice weekly - Tuesdays and Thursdays   meloxicam (MOBIC) 15 MG tablet Take 15 mg by mouth daily as needed.    metFORMIN (GLUCOPHAGE) 500 MG tablet Take 1 tablet (500 mg total) by mouth 2 (two) times daily with a meal.   potassium chloride SA (KLOR-CON) 20 MEQ tablet Take 1 tablet (20 mEq total) by mouth every morning.   rosuvastatin (CRESTOR) 20 MG tablet Take 1 tablet (20 mg total) by mouth daily.   No facility-administered encounter medications on file as of 06/30/2020.    Current Diagnosis: Patient Active Problem List   Diagnosis Date Noted   S/P TKR (total knee replacement), right 04/19/2019   Osteoarthritis of right knee 04/16/2019   Obesity, diabetes, and hypertension syndrome (HCC) 06/08/2018   Bradycardia 06/08/2018   Chronic fatigue 06/08/2018   PVC (premature ventricular contraction) 08/03/2017   Costochondritis 08/02/2017   DOE (dyspnea on exertion) 08/01/2017   Chest pain 05/28/2016   Essential hypertension 05/28/2016   Diabetes mellitus type 2 in obese (HCC) 05/28/2016   UPPER RESPIRATORY INFECTION, VIRAL 03/22/2009   ALLERGIC RHINITIS CAUSE UNSPECIFIED 11/04/2008   Hyperlipidemia 04/11/2008   COUGH 04/11/2008   OBSTRUCTIVE SLEEP APNEA 03/21/2008   CARPAL TUNNEL RELEASE, BILATERAL, HX OF 03/21/2008    Follow-Up:  Pharmacist Review - Called patient to schedule an phone appointment with [facility]  for February 15,2022 @ 10:30 AM . Patient is aware to have all medications and supplements at time of appointment.  Team Message- Billee Cashing  Cherylin Mylar, CPP. Notified   Jon Gills, [facility] Clinical Pharmacist Assistant [phone]

## 2020-07-04 DIAGNOSIS — R0602 Shortness of breath: Secondary | ICD-10-CM | POA: Diagnosis not present

## 2020-07-04 DIAGNOSIS — R0600 Dyspnea, unspecified: Secondary | ICD-10-CM | POA: Diagnosis not present

## 2020-07-04 DIAGNOSIS — I10 Essential (primary) hypertension: Secondary | ICD-10-CM | POA: Diagnosis not present

## 2020-07-04 DIAGNOSIS — G4733 Obstructive sleep apnea (adult) (pediatric): Secondary | ICD-10-CM | POA: Diagnosis not present

## 2020-07-07 DIAGNOSIS — H25811 Combined forms of age-related cataract, right eye: Secondary | ICD-10-CM | POA: Diagnosis not present

## 2020-07-07 HISTORY — PX: OTHER SURGICAL HISTORY: SHX169

## 2020-07-21 ENCOUNTER — Ambulatory Visit: Payer: Medicare HMO | Admitting: Podiatry

## 2020-07-24 ENCOUNTER — Ambulatory Visit: Payer: Medicare HMO | Admitting: Internal Medicine

## 2020-07-28 DIAGNOSIS — L300 Nummular dermatitis: Secondary | ICD-10-CM | POA: Diagnosis not present

## 2020-07-28 DIAGNOSIS — L708 Other acne: Secondary | ICD-10-CM | POA: Diagnosis not present

## 2020-07-28 DIAGNOSIS — L820 Inflamed seborrheic keratosis: Secondary | ICD-10-CM | POA: Diagnosis not present

## 2020-08-04 DIAGNOSIS — I10 Essential (primary) hypertension: Secondary | ICD-10-CM | POA: Diagnosis not present

## 2020-08-04 DIAGNOSIS — R0602 Shortness of breath: Secondary | ICD-10-CM | POA: Diagnosis not present

## 2020-08-04 DIAGNOSIS — R0600 Dyspnea, unspecified: Secondary | ICD-10-CM | POA: Diagnosis not present

## 2020-08-04 DIAGNOSIS — G4733 Obstructive sleep apnea (adult) (pediatric): Secondary | ICD-10-CM | POA: Diagnosis not present

## 2020-08-08 ENCOUNTER — Ambulatory Visit (INDEPENDENT_AMBULATORY_CARE_PROVIDER_SITE_OTHER): Payer: Medicare HMO

## 2020-08-08 DIAGNOSIS — E785 Hyperlipidemia, unspecified: Secondary | ICD-10-CM | POA: Diagnosis not present

## 2020-08-08 DIAGNOSIS — E1165 Type 2 diabetes mellitus with hyperglycemia: Secondary | ICD-10-CM | POA: Diagnosis not present

## 2020-08-08 DIAGNOSIS — I10 Essential (primary) hypertension: Secondary | ICD-10-CM | POA: Diagnosis not present

## 2020-08-08 NOTE — Progress Notes (Signed)
Chronic Care Management Pharmacy Note  08/19/2020 Name:  [name] MRN:  [id number] DOB:  [birth year]  Subjective: [name] is an 75 y.o. year old female who is a primary patient of Glendale Chard, MD.  The CCM team was consulted for assistance with disease management and care coordination needs.    Engaged with patient by telephone for follow up visit in response to provider referral for pharmacy case management and/or care coordination services.   Consent to Services:  The patient was given information about Chronic Care Management services, agreed to services, and gave verbal consent prior to initiation of services.  Please see initial visit note for detailed documentation.   Patient Care Team: Glendale Chard, MD as PCP - General (Internal Medicine) Cyril Mourning, [facility] (Inactive) (Pharmacist)  Recent office visits: 06/26/2020 PCP OV  Recent consult visits: 06/12/2020: Podiatry clinical support 05/25/2020 : Cardiology OV -Palpitations 05/04/2020: Otolaryngology OV- otitis externa, right ear Trial of Derm otic oil eardrops.  Appointment for follow-up offered but patient states she will contact us if she continues to have problems 04/19/2020 Podiatry OV : Diabetic Foot exam   Hospital visits: None in previous 6 months  Objective:  Lab Results  Component Value Date   CREATININE 0.72 06/26/2020   BUN 12 06/26/2020   GFRNONAA 82 06/26/2020   GFRAA 95 06/26/2020   NA 146 (H) 06/26/2020   K 4.3 06/26/2020   CALCIUM 9.4 06/26/2020   CO2 25 06/26/2020    Lab Results  Component Value Date/Time   HGBA1C 6.2 (H) 06/26/2020 11:44 AM   HGBA1C 6.1 (H) 04/10/2020 11:49 AM   MICROALBUR 10 06/26/2020 01:20 PM   MICROALBUR 10 06/09/2019 04:57 PM    Last diabetic Eye exam:  Lab Results  Component Value Date/Time   HMDIABEYEEXA No Retinopathy 05/09/2020 12:00 AM    Last diabetic Foot exam: No results found for: HMDIABFOOTEX   Lab Results  Component Value  Date   CHOL 187 04/10/2020   HDL 76 04/10/2020   LDLCALC 101 (H) 04/10/2020   TRIG 51 04/10/2020   CHOLHDL 2.5 04/10/2020    Hepatic Function Latest Ref Rng & Units 06/26/2020 04/10/2020 08/10/2019  Total Protein 6.0 - 8.5 g/dL 6.7 - 6.6  Albumin 3.7 - 4.7 g/dL 4.2 - 3.9  AST 0 - 40 IU/L 14 - 12  ALT 0 - 32 IU/L '13 10 7  ' Alk Phosphatase 44 - 121 IU/L 100 - 103  Total Bilirubin 0.0 - 1.2 mg/dL 0.3 - 0.3  Bilirubin, Direct <=0.2 mg/dL - - -    Lab Results  Component Value Date/Time   TSH 1.777 05/28/2016 06:09 PM    CBC Latest Ref Rng & Units 06/26/2020 04/22/2019 04/21/2019  WBC 3.4 - 10.8 x10E3/uL 6.5 10.5 11.0(H)  Hemoglobin 11.1 - 15.9 g/dL 12.2 10.2(L) 10.0(L)  Hematocrit 34.0 - 46.6 % 38.4 32.9(L) 32.1(L)  Platelets 150 - 450 x10E3/uL 255 205 217    Lab Results  Component Value Date/Time   VD25OH 40.6 12/31/2018 03:44 PM    Clinical ASCVD: Yes  The 10-year ASCVD risk score Mikey Bussing DC Jr., et al., 2013) is: 36%   Values used to calculate the score:     Age: 69 years     Sex: Female     Is Non-Hispanic African American: Yes     Diabetic: Yes     Tobacco smoker: No     Systolic Blood Pressure: 970 mmHg     Is BP treated: Yes  HDL Cholesterol: 76 mg/dL     Total Cholesterol: 187 mg/dL    Depression screen [facility] 2/9 06/08/2020 10/07/2019 06/09/2019  Decreased Interest 0 0 0  Down, Depressed, Hopeless 0 0 0  PHQ - 2 Score 0 0 0  Altered sleeping - - 3  Tired, decreased energy - - 0  Change in appetite - - 0  Feeling bad or failure about yourself  - - 0  Trouble concentrating - - 0  Moving slowly or fidgety/restless - - 0  Suicidal thoughts - - 0  PHQ-9 Score - - 3  Difficult doing work/chores - - Not difficult at all     Social History   Tobacco Use  Smoking Status Never Smoker  Smokeless Tobacco Never Used   BP Readings from Last 3 Encounters:  06/26/20 132/70  05/25/20 132/74  04/10/20 124/78   Pulse Readings from Last 3 Encounters:  06/26/20 60   05/25/20 62  04/10/20 (!) 53   Wt Readings from Last 3 Encounters:  06/26/20 218 lb 12.8 oz (99.2 kg)  06/08/20 212 lb (96.2 kg)  05/25/20 220 lb (99.8 kg)    Assessment/Interventions: Review of patient past medical history, allergies, medications, health status, including review of consultants reports, laboratory and other test data, was performed as part of comprehensive evaluation and provision of chronic care management services.   SDOH:  (Social Determinants of Health) assessments and interventions performed: No   CCM Care Plan  Allergies  Allergen Reactions  . Pravastatin Hives  . Haemophilus Influenzae Vaccines     Nausea, pain, body weakness  . Sulfa Antibiotics Itching  . Sulfonamide Derivatives Itching  . Tetanus Toxoids Swelling    Arm swelled at injection site    Medications Reviewed Today    Reviewed by Mayford Knife, RPH (Pharmacist) on 08/08/20 at 1043  Med List Status: <None>  Medication Order Taking? Sig Documenting Provider Last Dose Status Informant  Ascorbic Acid (VITAMIN C) 1000 MG tablet [id number]  Take 1,000 mg by mouth daily. [provider]  Active   aspirin EC 81 MG tablet [id number] Yes Take 1 tablet (81 mg total) by mouth 2 (two) times daily.  Patient taking differently: Take 81 mg by mouth every other day.   Leighton Parody, PA-C Taking Active   Bacillus Coagulans-Inulin (PROBIOTIC FORMULA PO) [id number] Yes Take 30 mg by mouth daily.  [provider] Taking Active Self  carvedilol (COREG) 3.125 MG tablet [id number] Yes carvedilol 3.125 mg tablet  Take 1 tablet twice a day by oral route. [provider] Taking Active   cetirizine (ZYRTEC) 10 MG tablet [id number] Yes Take 10 mg by mouth daily as needed for allergies. [provider] Taking Active   Cholecalciferol (VITAMIN D3) 50 MCG (2000 UT) capsule [id number] Yes Take 1 capsule by mouth daily at 12 noon.  [provider] Taking Active Self   clotrimazole-betamethasone (LOTRISONE) cream [id number] Yes Apply topically. [provider] Taking Active   Coenzyme Q10 (COQ10) 100 MG CAPS [id number] Yes Take 100 mg by mouth daily with lunch.  [provider] Taking Active Self  estradiol (ESTRACE) 0.1 MG/GM vaginal cream [id number] Yes estradiol 0.01% (0.1 mg/gram) vaginal cream  USE A SMALL PEA SIZED AMOUNT ON VULVA DAILY FOR 2 WEEKS [provider] Taking Active   fluticasone (FLONASE) 50 MCG/ACT nasal spray [id number]  Place 1 spray into both nostrils daily.  Patient taking differently: Place 1 spray into both nostrils daily as needed for allergies.  Glendale Chard, MD  Expired 05/25/20 2359 Self  furosemide (LASIX) 20 MG tablet [id number] Yes Take 1 tablet (20 mg total) by mouth daily as needed (fluid). Glendale Chard, MD Taking Active   hydrochlorothiazide (MICROZIDE) 12.5 MG capsule [id number] Yes Take 12.5 mg by mouth daily as needed (excess fluid/swelling).  [provider] Taking Active Self  loratadine (CLARITIN) 10 MG tablet [id number] Yes Take 10 mg by mouth daily. [provider] Taking Active Self  losartan (COZAAR) 25 MG tablet [id number] Yes Take 1 tablet (25 mg total) by mouth 2 (two) times a week. Take one tablet (25 mg) by mouth twice weekly - Tuesdays and Thursdays Glendale Chard, MD Taking Active   meloxicam [facility]) 15 MG tablet [id number] Yes Take 15 mg by mouth daily as needed.  [provider] Taking Active   metFORMIN (GLUCOPHAGE) 500 MG tablet [id number] Yes Take 1 tablet (500 mg total) by mouth 2 (two) times daily with a meal. Glendale Chard, MD Taking Active   potassium chloride SA (KLOR-CON) 20 MEQ tablet [id number] Yes Take 1 tablet (20 mEq total) by mouth every morning. Glendale Chard, MD Taking Active   rosuvastatin (CRESTOR) 20 MG tablet [id number] Yes Take 1 tablet (20 mg total) by mouth daily. Adrian Prows, MD Taking Active           Patient Active Problem List    Diagnosis Date Noted  . S/P TKR (total knee replacement), right 04/19/2019  . Osteoarthritis of right knee 04/16/2019  . Obesity, diabetes, and hypertension syndrome ([place]) 06/08/2018  . Bradycardia 06/08/2018  . Chronic fatigue 06/08/2018  . PVC (premature ventricular contraction) 08/03/2017  . Costochondritis 08/02/2017  . DOE (dyspnea on exertion) 08/01/2017  . Chest pain 05/28/2016  . Essential hypertension 05/28/2016  . Diabetes mellitus type 2 in obese ([place]) 05/28/2016  . UPPER RESPIRATORY INFECTION, VIRAL 03/22/2009  . ALLERGIC RHINITIS CAUSE UNSPECIFIED 11/04/2008  . Hyperlipidemia 04/11/2008  . COUGH 04/11/2008  . OBSTRUCTIVE SLEEP APNEA 03/21/2008  . CARPAL TUNNEL RELEASE, BILATERAL, HX OF 03/21/2008    Immunization History  Administered Date(s) Administered  . PFIZER(Purple Top)SARS-COV-2 Vaccination 09/02/2019, 09/27/2019, 06/13/2020    Conditions to be addressed/monitored:  Hypertension, Hyperlipidemia and Diabetes  Care Plan : [place]  Updates made by Mayford Knife, [place] since 08/19/2020 12:00 AM    Problem: HTN, HLD, DM   Priority: High    Long-Range Goal: Disease Management   Start Date: 08/08/2020  This Visit's Progress: On track  Priority: High  Note:    Current Barriers:  . Unable to achieve control of HTN, HLD    Pharmacist Clinical Goal(s):  Marland Kitchen Over the next 90 days, patient will achieve adherence to monitoring guidelines and medication adherence to achieve therapeutic efficacy . achieve control of HLD, HTN  as evidenced by continued improvement in LDL and BP readings.  through collaboration with PharmD and provider.   Interventions: . 1:1 collaboration with Glendale Chard, MD regarding development and update of comprehensive plan of care as evidenced by provider attestation and co-signature . Inter-disciplinary care team collaboration (see longitudinal plan of care) . Comprehensive medication review performed; medication list  updated in electronic medical record  Hypertension (BP goal <130/80) -uncontrolled -Current treatment: . Losartan 25 mg tablet daily.  . Hydrochlorothiazide 12.5 mg tablet daily taking 1 tablet by mouth as needed for fluid/swelling  . Carvedilol 3.125 mg tablet twice per day  -Current home readings: Patient does not check her BP at home but she is interested  in a BP cuff  -Current dietary habits: At least 1/2 a cup of green leafy vegetables per day.  -Current exercise habits: 3 times per week exercising in the chair for about 15 minutes  -Denies hypotensive/hypertensive symptoms -Educated on BP goals and benefits of medications for prevention of heart attack, stroke and kidney damage; Daily salt intake goal < 2300 mg; Exercise goal of 150 minutes per week; Symptoms of hypotension and importance of maintaining adequate hydration; -Counseled to monitor BP at home when she purchases a BP cuff at least two times per week, document, and provide log at future appointments -Counseled on diet and exercise extensively Recommended to continue current medication  Hyperlipidemia: (LDL goal < 70) -uncontrolled -Current treatment: . Rosuvastatin 20 mg - take daily  . Aspirin 81 mg daily  -Medications previously tried: Pravastatin (Hives)  -Current dietary patterns: patient avoids fried fatty foods.  -Current exercise habits: 20 minutes three times per week, patient recently had right knee replacement and she is going to get her next knee replaced but she has to figure it out.  -Educated on Cholesterol goals;  Benefits of statin for ASCVD risk reduction; Exercise goal of 150 minutes per week;  -Counseled on diet and exercise extensively Recommended to continue current medication  Diabetes (A1c goal <7%) -controlled -Current medications: Marland Kitchen Metformin 500 mg taking 1 tablet by mouth two times a day with a meal  -Current home glucose readings . fasting glucose:92- 100 . post prandial glucose: 92  - 100   -Denies hypoglycemic/hyperglycemic symptoms -Current meal patterns:  . breakfast: she usually eats cereal with pineapples or applesauce  . Lunch: sandwich or leftovers  . dinner: cooked day before, usually a starch, vegetable and meat - patient reports that she eats a small portion of starch (1/2 cup)  . snacks: tuna salad, or popcorn  . drinks: Non-sweet lemonade  -Current exercise: exercising for 20 minutes for 3 days - patient is going to start going to [employer] once a week or twice a week -Educated onA1c and blood sugar goals; Complications of diabetes including kidney damage, retinal damage, and cardiovascular disease; Exercise goal of 150 minutes per week; Prevention and management of hypoglycemic episodes; -Counseled to check feet daily and get yearly eye exams -Recommended to continue current medication  Edema (Goal:Reduce fluid in the ankles ) -controlled -Current treatment  . Potassium 20 meq - take 1 tablet daily as needed for swelling  . Furosemide 20 mg take daily as needed for swelling o Patient reports that she does not take everyday, in the last about 2-3 times.   o Pt uses her thumb to press on her ankles to see if there is any swelling  -Recommended to continue current medication as needed.     Health Maintenance -Vaccine gaps:  Vaccines   Reviewed and discussed patient's vaccination history.    Immunization History  Administered Date(s) Administered  . PFIZER(Purple Top)SARS-COV-2 Vaccination 09/02/2019, 09/27/2019, 06/13/2020   We Discussed:  -Patient has a history of vaccine side effects -Patient will consider the pneumonia vaccines but this very moment  -Patient to be seen by Dr. Baird Cancer on next month   Plan  Recommended patient receive pneumonia vaccine in next month in office.   -Current therapy:  . Sernico Betamethasone Spray- sample give by dermatologist  (Dr. Allyson Sabal)  . Probiotic Formula PO - taking 1 tablet by  . CoEnzyme Q10  taking daily  . Cholecalciferol Vitamin D3 - take 2000 unit capsule daily  . Ascorbic  Acid (Vitamin C) 1000 mg - taking daily  -Educated on Patient to continue to be seen by dermatologist as needed.  -Patient is satisfied with current therapy and denies issues -Recommended to continue current medication Recommended patient to continue to follow up with my if she has any questions about herbal supplements and vitatmins.    Patient Goals/Self-Care Activities . Over the next 120 days, patient will:  - take medications as prescribed check glucose at least 1 time per day, document, and provide at future appointments check blood pressure 2-3 times per week, document, and provide at future appointments  Follow Up Plan: Telephone follow up appointment with care management team member scheduled for: The patient has been provided with contact information for the care management team and has been advised to call with any health related questions or concerns.  Next PCP appointment scheduled for:  08/28/2020      Medication Assistance: None required.  Patient affirms current coverage meets needs.  Patient's preferred pharmacy is:  [facility] [address] ([address]), [place] [address] [place] ([place]) [place] [address] Phone: [phone] Fax: [phone]  Uses pill box? Yes Pt endorses 90% compliance  We discussed: Benefits of medication synchronization, packaging and delivery as well as enhanced pharmacist oversight with [employer]. Patient decided to: Continue current medication management strategy  Care Plan and Follow Up Patient Decision:  Patient agrees to Care Plan and Follow-up.  Plan: Telephone follow up appointment with care management team member scheduled for:  12/06/2020 and The patient has been provided with contact information for the care management team and has been advised to call with any health related questions or concerns.   Orlando Penner,  PharmD Clinical Pharmacist [facility] [phone]

## 2020-08-14 ENCOUNTER — Encounter: Payer: Self-pay | Admitting: Nurse Practitioner

## 2020-08-15 ENCOUNTER — Ambulatory Visit: Payer: Medicare HMO | Admitting: Internal Medicine

## 2020-08-19 NOTE — Patient Instructions (Signed)
Visit Information It was great speaking with you today!  Please let me know if you have any questions about our visit.  Goals Addressed            This Visit's Progress   . Manage My Medicine       Timeframe:  Long-Range Goal Priority:  High Start Date:    08/08/2020                         Expected En/d Date:                       Follow Up Date : 12/06/2020   - call for medicine refill 2 or 3 days before it runs out - keep a list of all the medicines I take; vitamins and herbals too - use a pillbox to sort medicine - use an alarm clock or phone to remind me to take my medicine    Why is this important?   . These steps will help you keep on track with your medicines.          Patient Care Plan: CCM Pharmacy Care Plan    Problem Identified: HTN, HLD, DM   Priority: High    Long-Range Goal: Disease Management   Start Date: 08/08/2020  This Visit's Progress: On track  Priority: High  Note:    Current Barriers:  . Unable to achieve control of HTN, HLD    Pharmacist Clinical Goal(s):  Marland Kitchen Over the next 90 days, patient will achieve adherence to monitoring guidelines and medication adherence to achieve therapeutic efficacy . achieve control of HLD, HTN  as evidenced by continued improvement in LDL and BP readings.  through collaboration with PharmD and provider.   Interventions: . 1:1 collaboration with Dorothyann Peng, MD regarding development and update of comprehensive plan of care as evidenced by provider attestation and co-signature . Inter-disciplinary care team collaboration (see longitudinal plan of care) . Comprehensive medication review performed; medication list updated in electronic medical record  Hypertension (BP goal <130/80) -uncontrolled -Current treatment: . Losartan 25 mg tablet daily.  . Hydrochlorothiazide 12.5 mg tablet daily taking 1 tablet by mouth as needed for fluid/swelling  . Carvedilol 3.125 mg tablet twice per day  -Current home readings:  Patient does not check her BP at home but she is interested in a BP cuff  -Current dietary habits: At least 1/2 a cup of green leafy vegetables per day.  -Current exercise habits: 3 times per week exercising in the chair for about 15 minutes  -Denies hypotensive/hypertensive symptoms -Educated on BP goals and benefits of medications for prevention of heart attack, stroke and kidney damage; Daily salt intake goal < 2300 mg; Exercise goal of 150 minutes per week; Symptoms of hypotension and importance of maintaining adequate hydration; -Counseled to monitor BP at home when she purchases a BP cuff at least two times per week, document, and provide log at future appointments -Counseled on diet and exercise extensively Recommended to continue current medication  Hyperlipidemia: (LDL goal < 70) -uncontrolled -Current treatment: . Rosuvastatin 20 mg - take daily  . Aspirin 81 mg daily  -Medications previously tried: Pravastatin (Hives)  -Current dietary patterns: patient avoids fried fatty foods.  -Current exercise habits: 20 minutes three times per week, patient recently had right knee replacement and she is going to get her next knee replaced but she has to figure it out.  -Educated on Cholesterol goals;  Benefits of statin for ASCVD risk reduction; Exercise goal of 150 minutes per week;  -Counseled on diet and exercise extensively Recommended to continue current medication  Diabetes (A1c goal <7%) -controlled -Current medications: Marland Kitchen Metformin 500 mg taking 1 tablet by mouth two times a day with a meal  -Current home glucose readings . fasting glucose:92- 100 . post prandial glucose: 92 - 100   -Denies hypoglycemic/hyperglycemic symptoms -Current meal patterns:  . breakfast: she usually eats cereal with pineapples or applesauce  . Lunch: sandwich or leftovers  . dinner: cooked day before, usually a starch, vegetable and meat - patient reports that she eats a small portion of starch  (1/2 cup)  . snacks: tuna salad, or popcorn  . drinks: Non-sweet lemonade  -Current exercise: exercising for 20 minutes for 3 days - patient is going to start going to [employer] once a week or twice a week -Educated onA1c and blood sugar goals; Complications of diabetes including kidney damage, retinal damage, and cardiovascular disease; Exercise goal of 150 minutes per week; Prevention and management of hypoglycemic episodes; -Counseled to check feet daily and get yearly eye exams -Recommended to continue current medication  Edema (Goal:Reduce fluid in the ankles ) -controlled -Current treatment  . Potassium 20 meq - take 1 tablet daily as needed for swelling  . Furosemide 20 mg take daily as needed for swelling o Patient reports that she does not take everyday, in the last about 2-3 times.   o Pt uses her thumb to press on her ankles to see if there is any swelling  -Recommended to continue current medication as needed.     Health Maintenance -Vaccine gaps:  Vaccines   Reviewed and discussed patient's vaccination history.    Immunization History  Administered Date(s) Administered  . PFIZER(Purple Top)SARS-COV-2 Vaccination 09/02/2019, 09/27/2019, 06/13/2020   We Discussed:  -Patient has a history of vaccine side effects -Patient will consider the pneumonia vaccines but this very moment  -Patient to be seen by Dr. Allyne Gee on next month   Plan  Recommended patient receive pneumonia vaccine in next month in office.   -Current therapy:  . Sernico Betamethasone Spray- sample give by dermatologist  (Dr. Terri Piedra)  . Probiotic Formula PO - taking 1 tablet by  . CoEnzyme Q10 taking daily  . Cholecalciferol Vitamin D3 - take 2000 unit capsule daily  . Ascorbic Acid (Vitamin C) 1000 mg - taking daily  -Educated on Patient to continue to be seen by dermatologist as needed.  -Patient is satisfied with current therapy and denies issues -Recommended to continue current  medication Recommended patient to continue to follow up with my if she has any questions about herbal supplements and vitatmins.    Patient Goals/Self-Care Activities . Over the next 120 days, patient will:  - take medications as prescribed check glucose at least 1 time per day, document, and provide at future appointments check blood pressure 2-3 times per week, document, and provide at future appointments  Follow Up Plan: Telephone follow up appointment with care management team member scheduled for: The patient has been provided with contact information for the care management team and has been advised to call with any health related questions or concerns.  Next PCP appointment scheduled for:  08/28/2020      Patient agreed to services and verbal consent obtained.   The patient verbalized understanding of instructions, educational materials, and care plan provided today and agreed to receive a mailed copy of patient instructions, educational materials,  and care plan.   Orlando Penner, PharmD Clinical Pharmacist [facility] [phone]

## 2020-08-28 ENCOUNTER — Ambulatory Visit (INDEPENDENT_AMBULATORY_CARE_PROVIDER_SITE_OTHER): Payer: Medicare HMO | Admitting: Internal Medicine

## 2020-08-28 ENCOUNTER — Encounter: Payer: Self-pay | Admitting: Internal Medicine

## 2020-08-28 ENCOUNTER — Other Ambulatory Visit: Payer: Self-pay

## 2020-08-28 VITALS — BP 122/80 | HR 60 | Temp 97.7°F | Ht 62.8 in | Wt 217.2 lb

## 2020-08-28 DIAGNOSIS — Z6838 Body mass index (BMI) 38.0-38.9, adult: Secondary | ICD-10-CM

## 2020-08-28 DIAGNOSIS — I10 Essential (primary) hypertension: Secondary | ICD-10-CM

## 2020-08-28 NOTE — Progress Notes (Signed)
I,Katawbba Wiggins,acting as a Neurosurgeon for [name], MD.,have documented all relevant documentation on the behalf of [name], MD,as directed by  [name], MD while in the presence of [name], MD.  This visit occurred during the SARS-CoV-2 public health emergency.  Safety protocols were in place, including screening questions prior to the visit, additional usage of staff PPE, and extensive cleaning of exam room while observing appropriate contact time as indicated for disinfecting solutions.  Subjective:     Patient ID: [name] , female    DOB: [birth year] , 75 y.o.   MRN: [id number]   Chief Complaint  Patient presents with  . Hypertension  . Obesity    HPI  The patient is here today for a diabetes and blood pressure f/u. She reports compliance with meds. She denies headaches, chest pain and shortness of breath. States she is working on her weight - trying to be more active. Unable to exercise regularly due to chronic knee pain. She is doing more chair exercises.   Hypertension This is a chronic problem. The current episode started more than 1 year ago. The problem has been gradually improving since onset. The problem is controlled. Pertinent negatives include no palpitations or shortness of breath. Risk factors for coronary artery disease include diabetes mellitus, dyslipidemia, obesity, post-menopausal state and sedentary lifestyle. The current treatment provides moderate improvement. Compliance problems include exercise.      Past Medical History:  Diagnosis Date  . Bladder infection   . Carpal tunnel syndrome   . Complication of anesthesia    Difficult to arouse  . Diabetes mellitus   . Hypertension   . Kidney stone   . Kidney stones   . Sleep apnea      Family History  Problem Relation Age of Onset  . Heart attack Father 40  . Stroke Father 62  . Diabetes Father   . Diabetes Brother   . Diabetes Brother   . Breast cancer Sister 73   . Diabetes Mother   . Hypertension Mother      Current Outpatient Medications:  .  Ascorbic Acid (VITAMIN C) 1000 MG tablet, Take 1,000 mg by mouth daily., Disp: , Rfl:  .  aspirin EC 81 MG tablet, Take 1 tablet (81 mg total) by mouth 2 (two) times daily. (Patient taking differently: Take 81 mg by mouth every other day.), Disp: 60 tablet, Rfl: 0 .  Bacillus Coagulans-Inulin (PROBIOTIC FORMULA PO), Take 30 mg by mouth daily. , Disp: , Rfl:  .  carvedilol (COREG) 3.125 MG tablet, carvedilol 3.125 mg tablet  Take 1 tablet twice a day by oral route., Disp: , Rfl:  .  cetirizine (ZYRTEC) 10 MG tablet, Take 10 mg by mouth daily as needed for allergies., Disp: , Rfl:  .  Cholecalciferol (VITAMIN D3) 50 MCG (2000 UT) capsule, Take 1 capsule by mouth daily at 12 noon. , Disp: , Rfl:  .  clotrimazole-betamethasone (LOTRISONE) cream, Apply topically., Disp: , Rfl:  .  Coenzyme Q10 (COQ10) 100 MG CAPS, Take 100 mg by mouth daily with lunch. , Disp: , Rfl:  .  estradiol (ESTRACE) 0.1 MG/GM vaginal cream, estradiol 0.01% (0.1 mg/gram) vaginal cream  USE A SMALL PEA SIZED AMOUNT ON VULVA DAILY FOR 2 WEEKS, Disp: , Rfl:  .  fluticasone (FLONASE) 50 MCG/ACT nasal spray, Place 1 spray into both nostrils daily. (Patient taking differently: Place 1 spray into both nostrils daily as needed for allergies.), Disp: 16  g, Rfl: 2 .  furosemide (LASIX) 20 MG tablet, Take 1 tablet (20 mg total) by mouth daily as needed (fluid)., Disp: 90 tablet, Rfl: 1 .  hydrochlorothiazide (MICROZIDE) 12.5 MG capsule, Take 12.5 mg by mouth daily as needed (excess fluid/swelling). , Disp: , Rfl:  .  loratadine (CLARITIN) 10 MG tablet, Take 10 mg by mouth daily., Disp: , Rfl:  .  losartan (COZAAR) 25 MG tablet, Take 1 tablet (25 mg total) by mouth 2 (two) times a week. Take one tablet (25 mg) by mouth twice weekly - Tuesdays and Thursdays, Disp: 36 tablet, Rfl: 3 .  meloxicam (MOBIC) 15 MG tablet, Take 15 mg by mouth daily as needed. ,  Disp: , Rfl:  .  metFORMIN (GLUCOPHAGE) 500 MG tablet, Take 1 tablet (500 mg total) by mouth 2 (two) times daily with a meal., Disp: 180 tablet, Rfl: 1 .  potassium chloride SA (KLOR-CON) 20 MEQ tablet, Take 1 tablet (20 mEq total) by mouth every morning., Disp: 90 tablet, Rfl: 1 .  rosuvastatin (CRESTOR) 20 MG tablet, Take 1 tablet (20 mg total) by mouth daily., Disp: 90 tablet, Rfl: 3   Allergies  Allergen Reactions  . Pravastatin Hives  . Haemophilus Influenzae Vaccines     Nausea, pain, body weakness  . Sulfa Antibiotics Itching  . Sulfonamide Derivatives Itching  . Tetanus Toxoids Swelling    Arm swelled at injection site     Review of Systems  Constitutional: Negative.   Respiratory: Negative.  Negative for shortness of breath.   Cardiovascular: Negative.  Negative for palpitations.  Gastrointestinal: Negative.   Psychiatric/Behavioral: Negative.   All other systems reviewed and are negative.    Today's Vitals   08/28/20 1113  BP: 122/80  Pulse: 60  Temp: 97.7 F (36.5 C)  TempSrc: Oral  Weight: 217 lb 3.2 oz (98.5 kg)  Height: 5' 2.8" (1.595 m)  PainSc: 0-No pain   Body mass index is 38.72 kg/m.  Wt Readings from Last 3 Encounters:  08/28/20 217 lb 3.2 oz (98.5 kg)  06/26/20 218 lb 12.8 oz (99.2 kg)  06/08/20 212 lb (96.2 kg)   BP Readings from Last 3 Encounters:  08/28/20 122/80  06/26/20 132/70  05/25/20 132/74    Objective:  Physical Exam Vitals and nursing note reviewed.  Constitutional:      Appearance: Normal appearance. She is obese.  HENT:     Head: Normocephalic and atraumatic.     Nose:     Comments: Masked     Mouth/Throat:     Comments: Masked  Cardiovascular:     Rate and Rhythm: Normal rate and regular rhythm.     Heart sounds: Normal heart sounds.  Pulmonary:     Breath sounds: Normal breath sounds.  Musculoskeletal:     Cervical back: Normal range of motion.  Skin:    General: Skin is warm.  Neurological:     General: No  focal deficit present.     Mental Status: She is alert and oriented to person, place, and time.         Assessment And Plan:     1. Essential hypertension, benign Comments: Chronic, well controlled. Importance of salt restriction was d/w patient. She will f/u in 2-3 months for diabetes check. Previous labs reviewed in detail.   2. Class 2 severe obesity due to excess calories with serious comorbidity and body mass index (BMI) of 38.0 to 38.9 in adult [facility]) Comments: She is encouraged to strive for  BMI less than 30 to decrease cardiac risk.   It appears she used a recording device during her visit. She did not admit to this during her visit.   Patient was given opportunity to ask questions. Patient verbalized understanding of the plan and was able to repeat key elements of the plan. All questions were answered to their satisfaction.    I, [name], MD, have reviewed all documentation for this visit. The documentation on 08/28/20 for the exam, diagnosis, procedures, and orders are all accurate and complete.  THE PATIENT IS ENCOURAGED TO PRACTICE SOCIAL DISTANCING DUE TO THE COVID-19 PANDEMIC.

## 2020-08-28 NOTE — Patient Instructions (Signed)
Diabetes Mellitus and Exercise Exercising regularly is important for overall health, especially for people who have diabetes mellitus. Exercising is not only about losing weight. It has many other health benefits, such as increasing muscle strength and bone density and reducing body fat and stress. This leads to improved fitness, flexibility, and endurance, all of which result in better overall health. What are the benefits of exercise if I have diabetes? Exercise has many benefits for people with diabetes. They include:  Helping to lower and control blood sugar (glucose).  Helping the body to respond better to the hormone insulin by improving insulin sensitivity.  Reducing how much insulin the body needs.  Lowering the risk for heart disease by: ? Lowering "bad" cholesterol and triglyceride levels. ? Increasing "good" cholesterol levels. ? Lowering blood pressure. ? Lowering blood glucose levels. What is my activity plan? Your health care provider or certified diabetes educator can help you make a plan for the type and frequency of exercise that works for you. This is called your activity plan. Be sure to:  Get at least 150 minutes of medium-intensity or high-intensity exercise each week. Exercises may include brisk walking, biking, or water aerobics.  Do stretching and strengthening exercises, such as yoga or weight lifting, at least 2 times a week.  Spread out your activity over at least 3 days of the week.  Get some form of physical activity each day. ? Do not go more than 2 days in a row without some kind of physical activity. ? Avoid being inactive for more than 90 minutes at a time. Take frequent breaks to walk or stretch.  Choose exercises or activities that you enjoy. Set realistic goals.  Start slowly and gradually increase your exercise intensity over time.   How do I manage my diabetes during exercise? Monitor your blood glucose  Check your blood glucose before and  after exercising. If your blood glucose is: ? 240 mg/dL (13.3 mmol/L) or higher before you exercise, check your urine for ketones. These are chemicals created by the liver. If you have ketones in your urine, do not exercise until your blood glucose returns to normal. ? 100 mg/dL (5.6 mmol/L) or lower, eat a snack containing 15-20 grams of carbohydrate. Check your blood glucose 15 minutes after the snack to make sure that your glucose level is above 100 mg/dL (5.6 mmol/L) before you start your exercise.  Know the symptoms of low blood glucose (hypoglycemia) and how to treat it. Your risk for hypoglycemia increases during and after exercise. Follow these tips and your health care provider's instructions  Keep a carbohydrate snack that is fast-acting for use before, during, and after exercise to help prevent or treat hypoglycemia.  Avoid injecting insulin into areas of the body that are going to be exercised. For example, avoid injecting insulin into: ? Your arms, when you are about to play tennis. ? Your legs, when you are about to go jogging.  Keep records of your exercise habits. Doing this can help you and your health care provider adjust your diabetes management plan as needed. Write down: ? Food that you eat before and after you exercise. ? Blood glucose levels before and after you exercise. ? The type and amount of exercise you have done.  Work with your health care provider when you start a new exercise or activity. He or she may need to: ? Make sure that the activity is safe for you. ? Adjust your insulin, other medicines, and food that   you eat.  Drink plenty of water while you exercise. This prevents loss of water (dehydration) and problems caused by a lot of heat in the body (heat stroke).   Where to find more information  American Diabetes Association: www.diabetes.org Summary  Exercising regularly is important for overall health, especially for people who have diabetes  mellitus.  Exercising has many health benefits. It increases muscle strength and bone density and reduces body fat and stress. It also lowers and controls blood glucose.  Your health care provider or certified diabetes educator can help you make an activity plan for the type and frequency of exercise that works for you.  Work with your health care provider to make sure any new activity is safe for you. Also work with your health care provider to adjust your insulin, other medicines, and the food you eat. This information is not intended to replace advice given to you by your health care provider. Make sure you discuss any questions you have with your health care provider. Document Revised: 03/08/2019 Document Reviewed: 03/08/2019 Elsevier Patient Education  2021 Elsevier Inc.  

## 2020-09-01 ENCOUNTER — Other Ambulatory Visit: Payer: Self-pay

## 2020-09-01 DIAGNOSIS — R0600 Dyspnea, unspecified: Secondary | ICD-10-CM | POA: Diagnosis not present

## 2020-09-01 DIAGNOSIS — R0602 Shortness of breath: Secondary | ICD-10-CM | POA: Diagnosis not present

## 2020-09-01 DIAGNOSIS — I10 Essential (primary) hypertension: Secondary | ICD-10-CM | POA: Diagnosis not present

## 2020-09-01 DIAGNOSIS — G4733 Obstructive sleep apnea (adult) (pediatric): Secondary | ICD-10-CM | POA: Diagnosis not present

## 2020-09-01 MED ORDER — CARVEDILOL 3.125 MG PO TABS: ORAL_TABLET | ORAL | 1 refills | Status: DC

## 2020-09-04 ENCOUNTER — Telehealth: Payer: Self-pay | Admitting: Student

## 2020-09-04 MED ORDER — CARVEDILOL 3.125 MG PO TABS: ORAL_TABLET | ORAL | 1 refills | Status: DC

## 2020-09-04 NOTE — Telephone Encounter (Signed)
Patient called on-call service requesting refill of Coreg be sent to pharmacy. Refill has been sent.

## 2020-10-02 DIAGNOSIS — R0600 Dyspnea, unspecified: Secondary | ICD-10-CM | POA: Diagnosis not present

## 2020-10-02 DIAGNOSIS — I10 Essential (primary) hypertension: Secondary | ICD-10-CM | POA: Diagnosis not present

## 2020-10-02 DIAGNOSIS — R0602 Shortness of breath: Secondary | ICD-10-CM | POA: Diagnosis not present

## 2020-10-02 DIAGNOSIS — G4733 Obstructive sleep apnea (adult) (pediatric): Secondary | ICD-10-CM | POA: Diagnosis not present

## 2020-11-01 DIAGNOSIS — G4733 Obstructive sleep apnea (adult) (pediatric): Secondary | ICD-10-CM | POA: Diagnosis not present

## 2020-11-01 DIAGNOSIS — R0600 Dyspnea, unspecified: Secondary | ICD-10-CM | POA: Diagnosis not present

## 2020-11-01 DIAGNOSIS — I10 Essential (primary) hypertension: Secondary | ICD-10-CM | POA: Diagnosis not present

## 2020-11-01 DIAGNOSIS — R0602 Shortness of breath: Secondary | ICD-10-CM | POA: Diagnosis not present

## 2020-11-06 ENCOUNTER — Telehealth: Payer: Self-pay

## 2020-11-06 NOTE — Chronic Care Management (AMB) (Signed)
Chronic Care Management Pharmacy Assistant   Name: [name]  MRN: [id number] DOB: 16-Oct-1945   Reason for Encounter: Disease State/ Hypertension, Diabetes    Recent office visits:  08-28-2020 Dorothyann Peng, MD  Recent consult visits:  None  Hospital visits:  None in previous 6 months  Medications: Outpatient Encounter Medications as of 11/06/2020  Medication Sig  . Ascorbic Acid (VITAMIN C) 1000 MG tablet Take 1,000 mg by mouth daily.  Marland Kitchen aspirin EC 81 MG tablet Take 1 tablet (81 mg total) by mouth 2 (two) times daily. (Patient taking differently: Take 81 mg by mouth every other day.)  . Bacillus Coagulans-Inulin (PROBIOTIC FORMULA PO) Take 30 mg by mouth daily.   . carvedilol (COREG) 3.125 MG tablet carvedilol 3.125 mg tablet  Take 1 tablet twice a day by oral route.  . cetirizine (ZYRTEC) 10 MG tablet Take 10 mg by mouth daily as needed for allergies.  . Cholecalciferol (VITAMIN D3) 50 MCG (2000 UT) capsule Take 1 capsule by mouth daily at 12 noon.   . clotrimazole-betamethasone (LOTRISONE) cream Apply topically.  . Coenzyme Q10 (COQ10) 100 MG CAPS Take 100 mg by mouth daily with lunch.   . estradiol (ESTRACE) 0.1 MG/GM vaginal cream estradiol 0.01% (0.1 mg/gram) vaginal cream  USE A SMALL PEA SIZED AMOUNT ON VULVA DAILY FOR 2 WEEKS  . fluticasone (FLONASE) 50 MCG/ACT nasal spray Place 1 spray into both nostrils daily. (Patient taking differently: Place 1 spray into both nostrils daily as needed for allergies.)  . furosemide (LASIX) 20 MG tablet Take 1 tablet (20 mg total) by mouth daily as needed (fluid).  . hydrochlorothiazide (MICROZIDE) 12.5 MG capsule Take 12.5 mg by mouth daily as needed (excess fluid/swelling).   Marland Kitchen loratadine (CLARITIN) 10 MG tablet Take 10 mg by mouth daily.  Marland Kitchen losartan (COZAAR) 25 MG tablet Take 1 tablet (25 mg total) by mouth 2 (two) times a week. Take one tablet (25 mg) by mouth twice weekly - Tuesdays and Thursdays  . meloxicam (MOBIC) 15  MG tablet Take 15 mg by mouth daily as needed.   . metFORMIN (GLUCOPHAGE) 500 MG tablet Take 1 tablet (500 mg total) by mouth 2 (two) times daily with a meal.  . potassium chloride SA (KLOR-CON) 20 MEQ tablet Take 1 tablet (20 mEq total) by mouth every morning.  . rosuvastatin (CRESTOR) 20 MG tablet Take 1 tablet (20 mg total) by mouth daily.   No facility-administered encounter medications on file as of 11/06/2020.   Reviewed chart prior to disease state call. Spoke with patient regarding BP  Recent Office Vitals: BP Readings from Last 3 Encounters:  08/28/20 122/80  06/26/20 132/70  05/25/20 132/74   Pulse Readings from Last 3 Encounters:  08/28/20 60  06/26/20 60  05/25/20 62    Wt Readings from Last 3 Encounters:  08/28/20 217 lb 3.2 oz (98.5 kg)  06/26/20 218 lb 12.8 oz (99.2 kg)  06/08/20 212 lb (96.2 kg)     Kidney Function Lab Results  Component Value Date/Time   CREATININE 0.72 06/26/2020 11:44 AM   CREATININE 0.75 04/10/2020 11:49 AM   GFRNONAA 82 06/26/2020 11:44 AM   GFRAA 95 06/26/2020 11:44 AM    BMP Latest Ref Rng & Units 06/26/2020 04/10/2020 12/09/2019  Glucose 65 - 99 mg/dL 338(S) 505(L) 976(B)  BUN 8 - 27 mg/dL 12 12 11   Creatinine 0.57 - 1.00 mg/dL 3.41 9.37  BUN/Creat Ratio 12 - 28 17 16 15   Sodium  134 - 144 mmol/L 146(H) 144 143  Potassium 3.5 - 5.2 mmol/L 4.3 4.3 4.8  Chloride 96 - 106 mmol/L 108(H) 105 107(H)  CO2 20 - 29 mmol/L 25 26 24   Calcium 8.7 - 10.3 mg/dL 9.4 9.5 9.2    . Current antihypertensive regimen:   Losartan 25 mg tablet daily.   Hydrochlorothiazide 12.5 mg tablet daily taking 1 tablet by mouth as needed for fluid/swelling   Carvedilol 3.125 mg tablet twice per day   . How often are you checking your Blood Pressure? weekly   . Current home BP readings: 120/74  . What recent interventions/DTPs have been made by any provider to improve Blood Pressure control since last CPP Visit: Patient states she is now checking blood  pressure weekly at home and taking medications as directed.  . Any recent hospitalizations or ED visits since last visit with CPP? No   . What diet changes have been made to improve Blood Pressure Control?  o Patient states she has limited her salt intake and eats at least 1/2 cup of green leafy vegetables daily.  . What exercise is being done to improve your Blood Pressure Control?  o Patient states she does chair exercises 3 times per week  for about 15 minutes.  Adherence Review: Is the patient currently on ACE/ARB medication? No Does the patient have >5 day gap between last estimated fill dates? No  Recent Relevant Labs: Lab Results  Component Value Date/Time   HGBA1C 6.2 (H) 06/26/2020 11:44 AM   HGBA1C 6.1 (H) 04/10/2020 11:49 AM   MICROALBUR 10 06/26/2020 01:20 PM   MICROALBUR 10 06/09/2019 04:57 PM    Kidney Function Lab Results  Component Value Date/Time   CREATININE 0.72 06/26/2020 11:44 AM   CREATININE 0.75 04/10/2020 11:49 AM   GFRNONAA 82 06/26/2020 11:44 AM   GFRAA 95 06/26/2020 11:44 AM    . Current antihyperglycemic regimen:   Metformin 500 mg taking 1 tablet by mouth two times a day with a meal   . What recent interventions/DTPs have been made to improve glycemic control:  o Patient states she is taking medications as directed and checking blood sugar daily.  . Have there been any recent hospitalizations or ED visits since last visit with CPP? No   . Patient denies hypoglycemic symptoms  . Patient denies hyperglycemic symptoms  . How often are you checking your blood sugar? once daily . What are your blood sugars ranging?  o Fasting: 96, 98, 107 o Before meals: None o After meals: None o Bedtime: None  . During the week, how often does your blood glucose drop below 70? Never . Are you checking your feet daily/regularly? Patient states daily  Adherence Review: Is the patient currently on a STATIN medication? Yes Is the patient currently on  ACE/ARB medication? No Does the patient have >5 day gap between last estimated fill dates? No   Star Rating Drugs: Rosuvastatin 20mg - Last filled 08-24-20 [facility] Metformin 500mg - Last filled 09-04-20 [facility]  10-24-20 [facility]  [phone]

## 2020-11-23 DIAGNOSIS — N3000 Acute cystitis without hematuria: Secondary | ICD-10-CM | POA: Diagnosis not present

## 2020-11-23 DIAGNOSIS — N952 Postmenopausal atrophic vaginitis: Secondary | ICD-10-CM | POA: Diagnosis not present

## 2020-11-23 DIAGNOSIS — N3020 Other chronic cystitis without hematuria: Secondary | ICD-10-CM | POA: Diagnosis not present

## 2020-11-23 DIAGNOSIS — R8271 Bacteriuria: Secondary | ICD-10-CM | POA: Diagnosis not present

## 2020-11-29 ENCOUNTER — Encounter: Payer: Self-pay | Admitting: Internal Medicine

## 2020-11-29 ENCOUNTER — Other Ambulatory Visit: Payer: Self-pay

## 2020-11-29 ENCOUNTER — Other Ambulatory Visit: Payer: Self-pay | Admitting: Internal Medicine

## 2020-11-29 ENCOUNTER — Ambulatory Visit (INDEPENDENT_AMBULATORY_CARE_PROVIDER_SITE_OTHER): Payer: Medicare HMO | Admitting: Internal Medicine

## 2020-11-29 VITALS — BP 110/64 | HR 54 | Temp 98.7°F | Ht 62.0 in | Wt 214.0 lb

## 2020-11-29 DIAGNOSIS — I10 Essential (primary) hypertension: Secondary | ICD-10-CM | POA: Diagnosis not present

## 2020-11-29 DIAGNOSIS — E1165 Type 2 diabetes mellitus with hyperglycemia: Secondary | ICD-10-CM

## 2020-11-29 DIAGNOSIS — E11628 Type 2 diabetes mellitus with other skin complications: Secondary | ICD-10-CM | POA: Diagnosis not present

## 2020-11-29 DIAGNOSIS — Z6838 Body mass index (BMI) 38.0-38.9, adult: Secondary | ICD-10-CM

## 2020-11-29 DIAGNOSIS — R001 Bradycardia, unspecified: Secondary | ICD-10-CM

## 2020-11-29 DIAGNOSIS — K219 Gastro-esophageal reflux disease without esophagitis: Secondary | ICD-10-CM

## 2020-11-29 LAB — HEMOGLOBIN A1C
Est. average glucose Bld gHb Est-mCnc: 137 mg/dL
Hgb A1c MFr Bld: 6.4 % — ABNORMAL HIGH (ref 4.8–5.6)

## 2020-11-29 MED ORDER — FAMOTIDINE 20 MG PO TABS: 20.0000 mg | ORAL_TABLET | Freq: Two times a day (BID) | ORAL | 1 refills | Status: DC

## 2020-11-29 NOTE — Patient Instructions (Signed)
Diabetes Mellitus and Exercise Exercising regularly is important for overall health, especially for people who have diabetes mellitus. Exercising is not only about losing weight. It has many other health benefits, such as increasing muscle strength and bone density and reducing body fat and stress. This leads to improved fitness, flexibility, and endurance, all of which result in better overall health. What are the benefits of exercise if I have diabetes? Exercise has many benefits for people with diabetes. They include:  Helping to lower and control blood sugar (glucose).  Helping the body to respond better to the hormone insulin by improving insulin sensitivity.  Reducing how much insulin the body needs.  Lowering the risk for heart disease by: ? Lowering "bad" cholesterol and triglyceride levels. ? Increasing "good" cholesterol levels. ? Lowering blood pressure. ? Lowering blood glucose levels. What is my activity plan? Your health care provider or certified diabetes educator can help you make a plan for the type and frequency of exercise that works for you. This is called your activity plan. Be sure to:  Get at least 150 minutes of medium-intensity or high-intensity exercise each week. Exercises may include brisk walking, biking, or water aerobics.  Do stretching and strengthening exercises, such as yoga or weight lifting, at least 2 times a week.  Spread out your activity over at least 3 days of the week.  Get some form of physical activity each day. ? Do not go more than 2 days in a row without some kind of physical activity. ? Avoid being inactive for more than 90 minutes at a time. Take frequent breaks to walk or stretch.  Choose exercises or activities that you enjoy. Set realistic goals.  Start slowly and gradually increase your exercise intensity over time.   How do I manage my diabetes during exercise? Monitor your blood glucose  Check your blood glucose before and  after exercising. If your blood glucose is: ? 240 mg/dL (13.3 mmol/L) or higher before you exercise, check your urine for ketones. These are chemicals created by the liver. If you have ketones in your urine, do not exercise until your blood glucose returns to normal. ? 100 mg/dL (5.6 mmol/L) or lower, eat a snack containing 15-20 grams of carbohydrate. Check your blood glucose 15 minutes after the snack to make sure that your glucose level is above 100 mg/dL (5.6 mmol/L) before you start your exercise.  Know the symptoms of low blood glucose (hypoglycemia) and how to treat it. Your risk for hypoglycemia increases during and after exercise. Follow these tips and your health care provider's instructions  Keep a carbohydrate snack that is fast-acting for use before, during, and after exercise to help prevent or treat hypoglycemia.  Avoid injecting insulin into areas of the body that are going to be exercised. For example, avoid injecting insulin into: ? Your arms, when you are about to play tennis. ? Your legs, when you are about to go jogging.  Keep records of your exercise habits. Doing this can help you and your health care provider adjust your diabetes management plan as needed. Write down: ? Food that you eat before and after you exercise. ? Blood glucose levels before and after you exercise. ? The type and amount of exercise you have done.  Work with your health care provider when you start a new exercise or activity. He or she may need to: ? Make sure that the activity is safe for you. ? Adjust your insulin, other medicines, and food that   you eat.  Drink plenty of water while you exercise. This prevents loss of water (dehydration) and problems caused by a lot of heat in the body (heat stroke).   Where to find more information  American Diabetes Association: www.diabetes.org Summary  Exercising regularly is important for overall health, especially for people who have diabetes  mellitus.  Exercising has many health benefits. It increases muscle strength and bone density and reduces body fat and stress. It also lowers and controls blood glucose.  Your health care provider or certified diabetes educator can help you make an activity plan for the type and frequency of exercise that works for you.  Work with your health care provider to make sure any new activity is safe for you. Also work with your health care provider to adjust your insulin, other medicines, and the food you eat. This information is not intended to replace advice given to you by your health care provider. Make sure you discuss any questions you have with your health care provider. Document Revised: 03/08/2019 Document Reviewed: 03/08/2019 Elsevier Patient Education  2021 Elsevier Inc.  

## 2020-11-29 NOTE — Progress Notes (Signed)
I,Katawbba Wiggins,acting as a Education administrator for [name], MD.,have documented all relevant documentation on the behalf of [name], MD,as directed by  [name], MD while in the presence of [name], MD.  This visit occurred during the SARS-CoV-2 public health emergency.  Safety protocols were in place, including screening questions prior to the visit, additional usage of staff PPE, and extensive cleaning of exam room while observing appropriate contact time as indicated for disinfecting solutions.  Subjective:     Patient ID: [name] , female    DOB: 06/13/1946 , 75 y.o.   MRN: [id number]   Chief Complaint  Patient presents with   Diabetes   Hypertension    HPI  The patient is here today for a diabetes and blood pressure f/u. She reports compliance with meds. She reports she has been more active. She denies chest pain, shortness of breath and palpitations when exercising.   Diabetes She presents for her follow-up diabetic visit. She has type 2 diabetes mellitus. Her disease course has been stable. There are no hypoglycemic associated symptoms. Pertinent negatives for diabetes include no blurred vision and no chest pain. There are no hypoglycemic complications. Risk factors for coronary artery disease include dyslipidemia, diabetes mellitus, hypertension, obesity, post-menopausal and sedentary lifestyle. She is following a generally healthy diet. Her home blood glucose trend is fluctuating minimally. Her breakfast blood glucose is taken between 8-9 am. Her breakfast blood glucose range is generally 110-130 mg/dl. An ACE inhibitor/angiotensin II receptor blocker is being taken. Eye exam is current.  Hypertension This is a chronic problem. The current episode started more than 1 year ago. The problem has been gradually improving since onset. The problem is controlled. Pertinent negatives include no blurred vision, chest pain, palpitations or shortness of breath. Risk  factors for coronary artery disease include diabetes mellitus, dyslipidemia, obesity, post-menopausal state and sedentary lifestyle. Past treatments include angiotensin blockers and diuretics.    Past Medical History:  Diagnosis Date   Bladder infection    Carpal tunnel syndrome    Complication of anesthesia    Difficult to arouse   Diabetes mellitus    Hypertension    Kidney stone    Kidney stones    Sleep apnea      Family History  Problem Relation Age of Onset   Heart attack Father 53   Stroke Father 34   Diabetes Father    Diabetes Brother    Diabetes Brother    Breast cancer Sister 30   Diabetes Mother    Hypertension Mother      Current Outpatient Medications:    Ascorbic Acid (VITAMIN C) 1000 MG tablet, Take 1,000 mg by mouth daily., Disp: , Rfl:    aspirin EC 81 MG tablet, Take 1 tablet (81 mg total) by mouth 2 (two) times daily. (Patient taking differently: Take 81 mg by mouth every other day.), Disp: 60 tablet, Rfl: 0   Bacillus Coagulans-Inulin (PROBIOTIC FORMULA PO), Take 30 mg by mouth daily. , Disp: , Rfl:    carvedilol (COREG) 3.125 MG tablet, carvedilol 3.125 mg tablet  Take 1 tablet twice a day by oral route., Disp: 180 tablet, Rfl: 1   cetirizine (ZYRTEC) 10 MG tablet, Take 10 mg by mouth daily as needed for allergies., Disp: , Rfl:    Cholecalciferol (VITAMIN D3) 50 MCG (2000 UT) capsule, Take 1 capsule by mouth daily at 12 noon. , Disp: , Rfl:    clotrimazole-betamethasone (LOTRISONE) cream, Apply topically.,  Disp: , Rfl:    Coenzyme Q10 (COQ10) 100 MG CAPS, Take 100 mg by mouth daily with lunch. , Disp: , Rfl:    estradiol (ESTRACE) 0.1 MG/GM vaginal cream, estradiol 0.01% (0.1 mg/gram) vaginal cream  USE A SMALL PEA SIZED AMOUNT ON VULVA DAILY FOR 2 WEEKS, Disp: , Rfl:    furosemide (LASIX) 20 MG tablet, Take 1 tablet (20 mg total) by mouth daily as needed (fluid)., Disp: 90 tablet, Rfl: 1   hydrochlorothiazide (MICROZIDE) 12.5 MG capsule, Take 12.5 mg by  mouth daily as needed (excess fluid/swelling). , Disp: , Rfl:    loratadine (CLARITIN) 10 MG tablet, Take 10 mg by mouth daily., Disp: , Rfl:    losartan (COZAAR) 25 MG tablet, Take 1 tablet (25 mg total) by mouth 2 (two) times a week. Take one tablet (25 mg) by mouth twice weekly - Tuesdays and Thursdays, Disp: 36 tablet, Rfl: 3   meloxicam (MOBIC) 15 MG tablet, Take 15 mg by mouth daily as needed. , Disp: , Rfl:    metFORMIN (GLUCOPHAGE) 500 MG tablet, Take 1 tablet (500 mg total) by mouth 2 (two) times daily with a meal., Disp: 180 tablet, Rfl: 1   nitrofurantoin, macrocrystal-monohydrate, (MACROBID) 100 MG capsule, Take 100 mg by mouth 2 (two) times daily., Disp: , Rfl:    potassium chloride SA (KLOR-CON) 20 MEQ tablet, Take 1 tablet (20 mEq total) by mouth every morning., Disp: 90 tablet, Rfl: 1   rosuvastatin (CRESTOR) 20 MG tablet, Take 1 tablet (20 mg total) by mouth daily., Disp: 90 tablet, Rfl: 3   famotidine (PEPCID) 20 MG tablet, Take 1 tablet (20 mg total) by mouth 2 (two) times daily., Disp: 180 tablet, Rfl: 1   fluticasone (FLONASE) 50 MCG/ACT nasal spray, Place 1 spray into both nostrils daily. (Patient taking differently: Place 1 spray into both nostrils daily as needed for allergies.), Disp: 16 g, Rfl: 2   Allergies  Allergen Reactions   Pravastatin Hives   Haemophilus Influenzae Vaccines     Nausea, pain, body weakness   Sulfa Antibiotics Itching   Sulfonamide Derivatives Itching   Tetanus Toxoids Swelling    Arm swelled at injection site     Review of Systems  Constitutional: Negative.   Eyes:  Negative for blurred vision.  Respiratory: Negative.  Negative for shortness of breath.   Cardiovascular: Negative.  Negative for chest pain and palpitations.  Gastrointestinal: Negative.   Neurological: Negative.   Psychiatric/Behavioral: Negative.      Today's Vitals   11/29/20 1144  BP: 110/64  Pulse: (!) 54  Temp: 98.7 F (37.1 C)  TempSrc: Oral  Weight: 214 lb  (97.1 kg)  Height: '5\' 2"'  (1.575 m)   Body mass index is 39.14 kg/m.  Wt Readings from Last 3 Encounters:  11/29/20 214 lb (97.1 kg)  08/28/20 217 lb 3.2 oz (98.5 kg)  06/26/20 218 lb 12.8 oz (99.2 kg)   BP Readings from Last 3 Encounters:  11/29/20 110/64  08/28/20 122/80  06/26/20 132/70   Objective:  Physical Exam Vitals and nursing note reviewed.  Constitutional:      Appearance: Normal appearance. She is obese.  HENT:     Head: Normocephalic and atraumatic.     Nose:     Comments: Masked     Mouth/Throat:     Comments: Masked  Cardiovascular:     Rate and Rhythm: Normal rate and regular rhythm.     Heart sounds: Normal heart sounds.  Pulmonary:  Effort: Pulmonary effort is normal.     Breath sounds: Normal breath sounds.  Musculoskeletal:     Cervical back: Normal range of motion.  Skin:    General: Skin is warm.  Neurological:     General: No focal deficit present.     Mental Status: She is alert.  Psychiatric:        Mood and Affect: Mood normal.        Behavior: Behavior normal.        Assessment And Plan:     1. Uncontrolled type 2 diabetes mellitus with hyperglycemia (HCC) Comments: Chronic, I will check labs as listed below. Encouraged to follow dietary guidelines and limit her intake of sweetened beverages.  - BMP8+EGFR - Hemoglobin A1c  2. Essential hypertension, benign Comments: Chronic, well controlled. She is encouraged to follow low sodium diet. I will check renal function today.   3. Gastroesophageal reflux disease without esophagitis Comments: Chronic, she was given refill of famotidine to take twice daily. Reminded to stop eating 3 hours prior to going to bed/lying down.   4. Bradycardia Comments: HR 54, she is asymptomatic.   5. Class 2 severe obesity due to excess calories with serious comorbidity and body mass index (BMI) of 38.0 to 38.9 in adult [facility]) Comments: She was congratulated on her 4 pound weight loss since Jan 2022.  Encouraged to strive to lose 21 pounds to decrease cardiac risk.    Patient was given opportunity to ask questions. Patient verbalized understanding of the plan and was able to repeat key elements of the plan. All questions were answered to their satisfaction.   I, [name], MD, have reviewed all documentation for this visit. The documentation on 12/09/20 for the exam, diagnosis, procedures, and orders are all accurate and complete.   IF YOU HAVE BEEN REFERRED TO A SPECIALIST, IT MAY TAKE 1-2 WEEKS TO SCHEDULE/PROCESS THE REFERRAL. IF YOU HAVE NOT HEARD FROM US/SPECIALIST IN TWO WEEKS, PLEASE GIVE [place] A CALL AT [phone].   THE PATIENT IS ENCOURAGED TO PRACTICE SOCIAL DISTANCING DUE TO THE COVID-19 PANDEMIC.

## 2020-11-30 LAB — BMP8+EGFR
BUN/Creatinine Ratio: 15 (ref 12–28)
BUN: 11 mg/dL (ref 8–27)
CO2: 25 mmol/L (ref 20–29)
Calcium: 9.3 mg/dL (ref 8.7–10.3)
Chloride: 103 mmol/L (ref 96–106)
Creatinine, Ser: 0.75 mg/dL (ref 0.57–1.00)
Glucose: 99 mg/dL (ref 65–99)
Potassium: 4.5 mmol/L (ref 3.5–5.2)
Sodium: 142 mmol/L (ref 134–144)
eGFR: 82 mL/min/{1.73_m2} (ref >59)

## 2020-12-02 DIAGNOSIS — G4733 Obstructive sleep apnea (adult) (pediatric): Secondary | ICD-10-CM | POA: Diagnosis not present

## 2020-12-02 DIAGNOSIS — I10 Essential (primary) hypertension: Secondary | ICD-10-CM | POA: Diagnosis not present

## 2020-12-02 DIAGNOSIS — R0600 Dyspnea, unspecified: Secondary | ICD-10-CM | POA: Diagnosis not present

## 2020-12-02 DIAGNOSIS — R0602 Shortness of breath: Secondary | ICD-10-CM | POA: Diagnosis not present

## 2020-12-05 ENCOUNTER — Telehealth: Payer: Self-pay

## 2020-12-05 NOTE — Chronic Care Management (AMB) (Signed)
Left patient a voicemail on both lines to reschedule appointment scheduled with Vallie Pearson CPP on 12-06-2020 to a different day. Sent scheduling a message to cancel appointment.  Malecca Hicks CMA Clinical Pharmacist Assistant [phone]   

## 2020-12-05 NOTE — Progress Notes (Deleted)
Left patient a voicemail on both lines to reschedule appointment scheduled with Cherylin Mylar CPP on 12-06-2020 to a different day. Sent scheduling a message to cancel appointment.  Huey Romans [facility] Clinical Pharmacist Assistant [phone]

## 2020-12-06 ENCOUNTER — Telehealth: Payer: Self-pay

## 2020-12-06 NOTE — Progress Notes (Deleted)
Chronic Care Management Pharmacy Note  12/06/2020 Name:  [name] MRN:  [id number] DOB:  1946-04-14  Summary:   Recommendations/Changes made from today's visit:   Plan:    Subjective: [name] is an 75 y.o. year old female who is a primary patient of Glendale Chard, MD.  The CCM team was consulted for assistance with disease management and care coordination needs.    {CCMTELEPHONEFACETOFACE:21091510} for {CCMINITIALFOLLOWUPCHOICE:21091511} in response to provider referral for pharmacy case management and/or care coordination services.   Consent to Services:  {CCMCONSENTOPTIONS:25074}  Patient Care Team: Glendale Chard, MD as PCP - General (Internal Medicine) Cyril Mourning, [facility] (Inactive) (Pharmacist)  Recent office visits:   Recent consult visits:   Hospital visits: {Hospital DC Yes/No:25215}   Objective:  Lab Results  Component Value Date   CREATININE 0.75 11/29/2020   BUN 11 11/29/2020   GFRNONAA 82 06/26/2020   GFRAA 95 06/26/2020   NA 142 11/29/2020   K 4.5 11/29/2020   CALCIUM 9.3 11/29/2020   CO2 25 11/29/2020   GLUCOSE 99 11/29/2020    Lab Results  Component Value Date/Time   HGBA1C 6.4 (H) 11/29/2020 12:50 PM   HGBA1C 6.2 (H) 06/26/2020 11:44 AM   MICROALBUR 10 06/26/2020 01:20 PM   MICROALBUR 10 06/09/2019 04:57 PM    Last diabetic Eye exam:  Lab Results  Component Value Date/Time   HMDIABEYEEXA No Retinopathy 05/09/2020 12:00 AM    Last diabetic Foot exam: No results found for: HMDIABFOOTEX   Lab Results  Component Value Date   CHOL 187 04/10/2020   HDL 76 04/10/2020   LDLCALC 101 (H) 04/10/2020   TRIG 51 04/10/2020   CHOLHDL 2.5 04/10/2020    Hepatic Function Latest Ref Rng & Units 06/26/2020 04/10/2020 08/10/2019  Total Protein 6.0 - 8.5 g/dL 6.7 - 6.6  Albumin 3.7 - 4.7 g/dL 4.2 - 3.9  AST 0 - 40 IU/L 14 - 12  ALT 0 - 32 IU/L _0 Alk Phosphatase 44 - 121 IU/L 100 - 103  Total Bilirubin 0.0 - 1.2  mg/dL 0.3 - 0.3  Bilirubin, Direct <=0.2 mg/dL - - -    Lab Results  Component Value Date/Time   TSH 1.777 05/28/2016 06:09 PM    CBC Latest Ref Rng & Units 06/26/2020 04/22/2019 04/21/2019  WBC 3.4 - 10.8 x10E3/uL 6.5 10.5 11.0(H)  Hemoglobin 11.1 - 15.9 g/dL 12.2 10.2(L) 10.0(L)  Hematocrit 34.0 - 46.6 % 38.4 32.9(L) 32.1(L)  Platelets 150 - 450 x10E3/uL 255 205 217    Lab Results  Component Value Date/Time   VD25OH 40.6 12/31/2018 03:44 PM    Clinical ASCVD: {YES/NO:21197} The 10-year ASCVD risk score Mikey Bussing DC Jr., et al., 2013) is: 30.7%   Values used to calculate the score:     Age: 4 years     Sex: Female     Is Non-Hispanic African American: Yes     Diabetic: Yes     Tobacco smoker: No     Systolic Blood Pressure: 253 mmHg     Is BP treated: Yes     HDL Cholesterol: 76 mg/dL     Total Cholesterol: 187 mg/dL    Depression screen [facility] 2/9 06/08/2020 10/07/2019 06/09/2019  Decreased Interest 0 0 0  Down, Depressed, Hopeless 0 0 0  PHQ - 2 Score 0 0 0  Altered sleeping - - 3  Tired, decreased energy - - 0  Change in appetite - - 0  Feeling bad  or failure about yourself  - - 0  Trouble concentrating - - 0  Moving slowly or fidgety/restless - - 0  Suicidal thoughts - - 0  PHQ-9 Score - - 3  Difficult doing work/chores - - Not difficult at all     ***Other: (CHADS2VASc if Afib, MMRC or CAT for COPD, ACT, DEXA)  Social History   Tobacco Use  Smoking Status Never  Smokeless Tobacco Never   BP Readings from Last 3 Encounters:  11/29/20 110/64  08/28/20 122/80  06/26/20 132/70   Pulse Readings from Last 3 Encounters:  11/29/20 (!) 54  08/28/20 60  06/26/20 60   Wt Readings from Last 3 Encounters:  11/29/20 214 lb (97.1 kg)  08/28/20 217 lb 3.2 oz (98.5 kg)  06/26/20 218 lb 12.8 oz (99.2 kg)   BMI Readings from Last 3 Encounters:  11/29/20 39.14 kg/m  08/28/20 38.72 kg/m  06/26/20 39.01 kg/m    Assessment/Interventions: Review of patient past  medical history, allergies, medications, health status, including review of consultants reports, laboratory and other test data, was performed as part of comprehensive evaluation and provision of chronic care management services.   SDOH:  (Social Determinants of Health) assessments and interventions performed: {yes/no:20286}  SDOH Screenings   Alcohol Screen: Not on file  Depression (PHQ2-9): Low Risk    PHQ-2 Score: 0  Financial Resource Strain: Low Risk    Difficulty of Paying Living Expenses: Not hard at all  Food Insecurity: No Food Insecurity   Worried About Charity fundraiser in the Last Year: Never true   Ran Out of Food in the Last Year: Never true  Housing: Low Risk    Last Housing Risk Score: 0  Physical Activity: Inactive   Days of Exercise per Week: 0 days   Minutes of Exercise per Session: 0 min  Social Connections: Not on file  Stress: No Stress Concern Present   Feeling of Stress : Not at all  Tobacco Use: Low Risk    Smoking Tobacco Use: Never   Smokeless Tobacco Use: Never  Transportation Needs: No Transportation Needs   Lack of Transportation (Medical): No   Lack of Transportation (Non-Medical): No    CCM Care Plan  Allergies  Allergen Reactions   Pravastatin Hives   Haemophilus Influenzae Vaccines     Nausea, pain, body weakness   Sulfa Antibiotics Itching   Sulfonamide Derivatives Itching   Tetanus Toxoids Swelling    Arm swelled at injection site    Medications Reviewed Today     Reviewed by Glendale Chard, MD (Physician) on 11/29/20 at 1213  Med List Status: <None>   Medication Order Taking? Sig Documenting Provider Last Dose Status Informant  Ascorbic Acid (VITAMIN C) 1000 MG tablet [id number] Yes Take 1,000 mg by mouth daily. [provider] Taking Active   aspirin EC 81 MG tablet [id number] Yes Take 1 tablet (81 mg total) by mouth 2 (two) times daily.  Patient taking differently: Take 81 mg by mouth every other day.   Leighton Parody, PA-C Taking Active   Bacillus Coagulans-Inulin (PROBIOTIC FORMULA PO) [id number] Yes Take 30 mg by mouth daily.  [provider] Taking Active Self  carvedilol (COREG) 3.125 MG tablet [id number] Yes carvedilol 3.125 mg tablet  Take 1 tablet twice a day by oral route. Cantwell, Celeste C, PA-C Taking Active   cetirizine (ZYRTEC) 10 MG tablet [id number] Yes Take 10 mg by mouth daily as needed for allergies. [provider] Taking Active  Cholecalciferol (VITAMIN D3) 50 MCG (2000 UT) capsule [id number] Yes Take 1 capsule by mouth daily at 12 noon.  [provider] Taking Active Self  clotrimazole-betamethasone (LOTRISONE) cream [id number] Yes Apply topically. [provider] Taking Active   Coenzyme Q10 (COQ10) 100 MG CAPS [id number] Yes Take 100 mg by mouth daily with lunch.  [provider] Taking Active Self  estradiol (ESTRACE) 0.1 MG/GM vaginal cream [id number] Yes estradiol 0.01% (0.1 mg/gram) vaginal cream  USE A SMALL PEA SIZED AMOUNT ON VULVA DAILY FOR 2 WEEKS [provider] Taking Active   fluticasone (FLONASE) 50 MCG/ACT nasal spray [id number]  Place 1 spray into both nostrils daily.  Patient taking differently: Place 1 spray into both nostrils daily as needed for allergies.   Glendale Chard, MD  Expired 05/25/20 2359 Self  furosemide (LASIX) 20 MG tablet [id number] Yes Take 1 tablet (20 mg total) by mouth daily as needed (fluid). Glendale Chard, MD Taking Active   hydrochlorothiazide (MICROZIDE) 12.5 MG capsule [id number] Yes Take 12.5 mg by mouth daily as needed (excess fluid/swelling).  [provider] Taking Active Self  loratadine (CLARITIN) 10 MG tablet [id number] Yes Take 10 mg by mouth daily. [provider] Taking Active Self  losartan (COZAAR) 25 MG tablet [id number] Yes Take 1 tablet (25 mg total) by mouth 2 (two) times a week. Take one tablet (25 mg) by mouth twice weekly - Tuesdays and Thursdays Glendale Chard, MD  Taking Active   meloxicam [facility]) 15 MG tablet [id number] Yes Take 15 mg by mouth daily as needed.  [provider] Taking Active   metFORMIN (GLUCOPHAGE) 500 MG tablet [id number] Yes Take 1 tablet (500 mg total) by mouth 2 (two) times daily with a meal. Glendale Chard, MD Taking Active   nitrofurantoin, macrocrystal-monohydrate, (MACROBID) 100 MG capsule [id number] Yes Take 100 mg by mouth 2 (two) times daily. [provider] Taking Active   potassium chloride SA (KLOR-CON) 20 MEQ tablet [id number] Yes Take 1 tablet (20 mEq total) by mouth every morning. Glendale Chard, MD Taking Active   rosuvastatin (CRESTOR) 20 MG tablet [id number] Yes Take 1 tablet (20 mg total) by mouth daily. Adrian Prows, MD Taking Active             Patient Active Problem List   Diagnosis Date Noted   S/P TKR (total knee replacement), right 04/19/2019   Osteoarthritis of right knee 04/16/2019   Obesity, diabetes, and hypertension syndrome ([place]) 06/08/2018   Bradycardia 06/08/2018   Chronic fatigue 06/08/2018   PVC (premature ventricular contraction) 08/03/2017   Costochondritis 08/02/2017   DOE (dyspnea on exertion) 08/01/2017   Chest pain 05/28/2016   Essential hypertension 05/28/2016   Diabetes mellitus type 2 in obese ([place]) 05/28/2016   UPPER RESPIRATORY INFECTION, VIRAL 03/22/2009   ALLERGIC RHINITIS CAUSE UNSPECIFIED 11/04/2008   Hyperlipidemia 04/11/2008   COUGH 04/11/2008   OBSTRUCTIVE SLEEP APNEA 03/21/2008   CARPAL TUNNEL RELEASE, BILATERAL, HX OF 03/21/2008    Immunization History  Administered Date(s) Administered   PFIZER(Purple Top)SARS-COV-2 Vaccination 09/02/2019, 09/27/2019, 06/13/2020    Conditions to be addressed/monitored:  {USCCMDZASSESSMENTOPTIONS:23563}  There are no care plans that you recently modified to display for this patient.    Medication Assistance: {MEDASSISTANCEINFO:25044}  Compliance/Adherence/Medication fill history: Care Gaps:   Star-Rating  Drugs:   Patient's preferred pharmacy is:  [facility] ([facility]), [place] [address] [place] ([place] [address] Phone: [phone] Fax: [phone]  Uses pill box? {Yes or  If no, why not?:20788} Pt endorses ***% compliance  We discussed: {Pharmacy options:24294} Patient decided to: {US Pharmacy Plan:23885}  Care Plan and Follow Up Patient Decision:  {FOLLOWUP:24991}  Plan: {CM FOLLOW UP VFAW:90502}      Current Barriers:  {pharmacybarriers:24917}  Pharmacist Clinical Goal(s):  Patient will {PHARMACYGOALCHOICES:24921} through collaboration with PharmD and provider.   Interventions: 1:1 collaboration with Glendale Chard, MD regarding development and update of comprehensive plan of care as evidenced by provider attestation and co-signature Inter-disciplinary care team collaboration (see longitudinal plan of care) Comprehensive medication review performed; medication list updated in electronic medical record  {CCM PHARMD DISEASE STATES:25130}  Patient Goals/Self-Care Activities Patient will:  - {pharmacypatientgoals:24919}  Follow Up Plan: {CM FOLLOW UP HIPR:48845}

## 2020-12-13 DIAGNOSIS — N130 Hydronephrosis with ureteropelvic junction obstruction: Secondary | ICD-10-CM | POA: Diagnosis not present

## 2020-12-13 DIAGNOSIS — R8271 Bacteriuria: Secondary | ICD-10-CM | POA: Diagnosis not present

## 2020-12-13 DIAGNOSIS — N3021 Other chronic cystitis with hematuria: Secondary | ICD-10-CM | POA: Diagnosis not present

## 2020-12-21 DIAGNOSIS — Z803 Family history of malignant neoplasm of breast: Secondary | ICD-10-CM | POA: Diagnosis not present

## 2020-12-21 DIAGNOSIS — Z1231 Encounter for screening mammogram for malignant neoplasm of breast: Secondary | ICD-10-CM | POA: Diagnosis not present

## 2020-12-21 LAB — HM MAMMOGRAPHY

## 2020-12-22 HISTORY — PX: OTHER SURGICAL HISTORY: SHX169

## 2020-12-26 ENCOUNTER — Encounter: Payer: Self-pay | Admitting: Internal Medicine

## 2020-12-27 DIAGNOSIS — H353131 Nonexudative age-related macular degeneration, bilateral, early dry stage: Secondary | ICD-10-CM | POA: Diagnosis not present

## 2020-12-27 DIAGNOSIS — E119 Type 2 diabetes mellitus without complications: Secondary | ICD-10-CM | POA: Diagnosis not present

## 2020-12-27 DIAGNOSIS — H04123 Dry eye syndrome of bilateral lacrimal glands: Secondary | ICD-10-CM | POA: Diagnosis not present

## 2020-12-27 DIAGNOSIS — Z961 Presence of intraocular lens: Secondary | ICD-10-CM | POA: Diagnosis not present

## 2020-12-27 DIAGNOSIS — H25812 Combined forms of age-related cataract, left eye: Secondary | ICD-10-CM | POA: Diagnosis not present

## 2020-12-27 DIAGNOSIS — H10413 Chronic giant papillary conjunctivitis, bilateral: Secondary | ICD-10-CM | POA: Diagnosis not present

## 2020-12-27 DIAGNOSIS — H43813 Vitreous degeneration, bilateral: Secondary | ICD-10-CM | POA: Diagnosis not present

## 2020-12-28 DIAGNOSIS — N200 Calculus of kidney: Secondary | ICD-10-CM | POA: Diagnosis not present

## 2020-12-28 DIAGNOSIS — N390 Urinary tract infection, site not specified: Secondary | ICD-10-CM | POA: Diagnosis not present

## 2020-12-28 DIAGNOSIS — N1330 Unspecified hydronephrosis: Secondary | ICD-10-CM | POA: Diagnosis not present

## 2021-01-01 DIAGNOSIS — R0602 Shortness of breath: Secondary | ICD-10-CM | POA: Diagnosis not present

## 2021-01-01 DIAGNOSIS — I10 Essential (primary) hypertension: Secondary | ICD-10-CM | POA: Diagnosis not present

## 2021-01-01 DIAGNOSIS — R0600 Dyspnea, unspecified: Secondary | ICD-10-CM | POA: Diagnosis not present

## 2021-01-01 DIAGNOSIS — G4733 Obstructive sleep apnea (adult) (pediatric): Secondary | ICD-10-CM | POA: Diagnosis not present

## 2021-01-03 DIAGNOSIS — H2512 Age-related nuclear cataract, left eye: Secondary | ICD-10-CM | POA: Diagnosis not present

## 2021-01-05 DIAGNOSIS — H25812 Combined forms of age-related cataract, left eye: Secondary | ICD-10-CM | POA: Diagnosis not present

## 2021-01-16 DIAGNOSIS — R8279 Other abnormal findings on microbiological examination of urine: Secondary | ICD-10-CM | POA: Diagnosis not present

## 2021-01-16 DIAGNOSIS — N130 Hydronephrosis with ureteropelvic junction obstruction: Secondary | ICD-10-CM | POA: Diagnosis not present

## 2021-01-16 DIAGNOSIS — N3000 Acute cystitis without hematuria: Secondary | ICD-10-CM | POA: Diagnosis not present

## 2021-02-01 DIAGNOSIS — R0602 Shortness of breath: Secondary | ICD-10-CM | POA: Diagnosis not present

## 2021-02-01 DIAGNOSIS — I10 Essential (primary) hypertension: Secondary | ICD-10-CM | POA: Diagnosis not present

## 2021-02-01 DIAGNOSIS — G4733 Obstructive sleep apnea (adult) (pediatric): Secondary | ICD-10-CM | POA: Diagnosis not present

## 2021-02-01 DIAGNOSIS — R0600 Dyspnea, unspecified: Secondary | ICD-10-CM | POA: Diagnosis not present

## 2021-02-05 DIAGNOSIS — Z0100 Encounter for examination of eyes and vision without abnormal findings: Secondary | ICD-10-CM | POA: Diagnosis not present

## 2021-02-05 DIAGNOSIS — H524 Presbyopia: Secondary | ICD-10-CM | POA: Diagnosis not present

## 2021-02-08 DIAGNOSIS — Z471 Aftercare following joint replacement surgery: Secondary | ICD-10-CM | POA: Diagnosis not present

## 2021-02-08 DIAGNOSIS — Z96651 Presence of right artificial knee joint: Secondary | ICD-10-CM | POA: Diagnosis not present

## 2021-02-08 DIAGNOSIS — M1712 Unilateral primary osteoarthritis, left knee: Secondary | ICD-10-CM | POA: Diagnosis not present

## 2021-02-26 ENCOUNTER — Other Ambulatory Visit: Payer: Self-pay | Admitting: Internal Medicine

## 2021-03-04 DIAGNOSIS — I10 Essential (primary) hypertension: Secondary | ICD-10-CM | POA: Diagnosis not present

## 2021-03-04 DIAGNOSIS — G4733 Obstructive sleep apnea (adult) (pediatric): Secondary | ICD-10-CM | POA: Diagnosis not present

## 2021-03-04 DIAGNOSIS — R0602 Shortness of breath: Secondary | ICD-10-CM | POA: Diagnosis not present

## 2021-03-04 DIAGNOSIS — R0600 Dyspnea, unspecified: Secondary | ICD-10-CM | POA: Diagnosis not present

## 2021-03-06 ENCOUNTER — Ambulatory Visit (INDEPENDENT_AMBULATORY_CARE_PROVIDER_SITE_OTHER): Payer: Medicare HMO | Admitting: Internal Medicine

## 2021-03-06 ENCOUNTER — Other Ambulatory Visit: Payer: Self-pay

## 2021-03-06 ENCOUNTER — Encounter: Payer: Self-pay | Admitting: Internal Medicine

## 2021-03-06 VITALS — BP 120/60 | HR 54 | Temp 98.3°F | Ht 62.0 in | Wt 216.8 lb

## 2021-03-06 DIAGNOSIS — I119 Hypertensive heart disease without heart failure: Secondary | ICD-10-CM | POA: Diagnosis not present

## 2021-03-06 DIAGNOSIS — I700 Atherosclerosis of aorta: Secondary | ICD-10-CM

## 2021-03-06 DIAGNOSIS — E1165 Type 2 diabetes mellitus with hyperglycemia: Secondary | ICD-10-CM

## 2021-03-06 DIAGNOSIS — E559 Vitamin D deficiency, unspecified: Secondary | ICD-10-CM | POA: Diagnosis not present

## 2021-03-06 DIAGNOSIS — I2510 Atherosclerotic heart disease of native coronary artery without angina pectoris: Secondary | ICD-10-CM

## 2021-03-06 DIAGNOSIS — Z6839 Body mass index (BMI) 39.0-39.9, adult: Secondary | ICD-10-CM | POA: Diagnosis not present

## 2021-03-06 DIAGNOSIS — Z887 Allergy status to serum and vaccine status: Secondary | ICD-10-CM | POA: Diagnosis not present

## 2021-03-06 DIAGNOSIS — E66812 Obesity, class 2: Secondary | ICD-10-CM

## 2021-03-06 MED ORDER — CLOTRIMAZOLE-BETAMETHASONE 1-0.05 % EX CREA: TOPICAL_CREAM | Freq: Two times a day (BID) | CUTANEOUS | 1 refills | Status: AC | PRN

## 2021-03-06 MED ORDER — VITAMIN D (ERGOCALCIFEROL) 1.25 MG (50000 UNIT) PO CAPS: ORAL_CAPSULE | ORAL | 0 refills | Status: DC

## 2021-03-06 NOTE — Patient Instructions (Signed)

## 2021-03-06 NOTE — Progress Notes (Signed)
I,Tianna Badgett,acting as a Education administrator for [name], MD.,have documented all relevant documentation on the behalf of [name], MD,as directed by  [name], MD while in the presence of [name], MD.  This visit occurred during the SARS-CoV-2 public health emergency.  Safety protocols were in place, including screening questions prior to the visit, additional usage of staff PPE, and extensive cleaning of exam room while observing appropriate contact time as indicated for disinfecting solutions.  Subjective:     Patient ID: [name] , female    DOB: 1945/09/25 , 75 y.o.   MRN: [id number]   Chief Complaint  Patient presents with   Diabetes   Hypertension    HPI  The patient is here today for a diabetes and blood pressure f/u. She reports compliance with meds. She reports she has been more active. She denies chest pain, shortness of breath and palpitations when exercising. She states that she is allergic to the flu and tetanus vaccines. She is declining the shingrix vaccine.   Diabetes She presents for her follow-up diabetic visit. She has type 2 diabetes mellitus. Her disease course has been stable. There are no hypoglycemic associated symptoms. Pertinent negatives for diabetes include no blurred vision and no chest pain. There are no hypoglycemic complications. Risk factors for coronary artery disease include dyslipidemia, diabetes mellitus, hypertension, obesity, post-menopausal and sedentary lifestyle. She is following a generally healthy diet. Her home blood glucose trend is fluctuating minimally. Her breakfast blood glucose is taken between 8-9 am. Her breakfast blood glucose range is generally 110-130 mg/dl. An ACE inhibitor/angiotensin II receptor blocker is being taken. Eye exam is current.  Hypertension This is a chronic problem. The current episode started more than 1 year ago. The problem has been gradually improving since onset. The problem is  controlled. Pertinent negatives include no blurred vision, chest pain, palpitations or shortness of breath. Risk factors for coronary artery disease include diabetes mellitus, dyslipidemia, obesity, post-menopausal state and sedentary lifestyle. Past treatments include angiotensin blockers and diuretics.  Leg Pain  The incident occurred more than 1 week ago. There was no injury mechanism. The pain is present in the right leg and left leg. The quality of the pain is described as aching. The pain is at a severity of 7/10. The pain is mild. The pain has been Fluctuating since onset. She reports no foreign bodies present. Nothing aggravates the symptoms. She has tried rest for the symptoms. The treatment provided mild relief.    Past Medical History:  Diagnosis Date   Bladder infection    Carpal tunnel syndrome    Complication of anesthesia    Difficult to arouse   Diabetes mellitus    Hypertension    Kidney stone    Kidney stones    Sleep apnea      Family History  Problem Relation Age of Onset   Heart attack Father 35   Stroke Father 30   Diabetes Father    Diabetes Brother    Diabetes Brother    Breast cancer Sister 25   Diabetes Mother    Hypertension Mother      Current Outpatient Medications:    Ascorbic Acid (VITAMIN C) 1000 MG tablet, Take 1,000 mg by mouth daily., Disp: , Rfl:    aspirin EC 81 MG tablet, Take 1 tablet (81 mg total) by mouth 2 (two) times daily. (Patient taking differently: Take 81 mg by mouth every other day.), Disp: 60 tablet, Rfl: 0  Bacillus Coagulans-Inulin (PROBIOTIC FORMULA PO), Take 30 mg by mouth daily. , Disp: , Rfl:    carvedilol (COREG) 3.125 MG tablet, carvedilol 3.125 mg tablet  Take 1 tablet twice a day by oral route., Disp: 180 tablet, Rfl: 1   cetirizine (ZYRTEC) 10 MG tablet, Take 10 mg by mouth daily as needed for allergies., Disp: , Rfl:    Cholecalciferol (VITAMIN D3) 50 MCG (2000 UT) capsule, Take 1 capsule by mouth daily at 12 noon. ,  Disp: , Rfl:    Coenzyme Q10 (COQ10) 100 MG CAPS, Take 100 mg by mouth daily with lunch. , Disp: , Rfl:    estradiol (ESTRACE) 0.1 MG/GM vaginal cream, estradiol 0.01% (0.1 mg/gram) vaginal cream  USE A SMALL PEA SIZED AMOUNT ON VULVA DAILY FOR 2 WEEKS, Disp: , Rfl:    famotidine (PEPCID) 20 MG tablet, Take 1 tablet (20 mg total) by mouth 2 (two) times daily., Disp: 180 tablet, Rfl: 1   fluticasone (FLONASE) 50 MCG/ACT nasal spray, Place 1 spray into both nostrils daily. (Patient taking differently: Place 1 spray into both nostrils daily as needed for allergies.), Disp: 16 g, Rfl: 2   furosemide (LASIX) 20 MG tablet, Take 1 tablet (20 mg total) by mouth daily as needed (fluid)., Disp: 90 tablet, Rfl: 1   hydrochlorothiazide (MICROZIDE) 12.5 MG capsule, Take 12.5 mg by mouth daily as needed (excess fluid/swelling). , Disp: , Rfl:    loratadine (CLARITIN) 10 MG tablet, Take 10 mg by mouth daily., Disp: , Rfl:    losartan (COZAAR) 25 MG tablet, Take 1 tablet (25 mg total) by mouth 2 (two) times a week. Take one tablet (25 mg) by mouth twice weekly - Tuesdays and Thursdays, Disp: 36 tablet, Rfl: 3   meloxicam (MOBIC) 15 MG tablet, Take 15 mg by mouth daily as needed. , Disp: , Rfl:    metFORMIN (GLUCOPHAGE) 500 MG tablet, Take 1 tablet (500 mg total) by mouth 2 (two) times daily with a meal., Disp: 180 tablet, Rfl: 1   OneTouch Delica Lancets 44B MISC, USE   TO CHECK GLUCOSE BEFORE BREAKFAST AND  DINNER, Disp: 100 each, Rfl: 0   potassium chloride SA (KLOR-CON) 20 MEQ tablet, Take 1 tablet (20 mEq total) by mouth every morning., Disp: 90 tablet, Rfl: 1   rosuvastatin (CRESTOR) 20 MG tablet, Take 1 tablet (20 mg total) by mouth daily., Disp: 90 tablet, Rfl: 3   Vitamin D, Ergocalciferol, (DRISDOL) 1.25 MG (50000 UNIT) CAPS capsule, Take one capsule twice weekly on Tues/Friday, Disp: 24 capsule, Rfl: 0   clotrimazole-betamethasone (LOTRISONE) cream, Apply topically 2 (two) times daily as needed., Disp: 30  g, Rfl: 1   Allergies  Allergen Reactions   Pravastatin Hives   Haemophilus Influenzae Vaccines     Nausea, pain, body weakness   Sulfa Antibiotics Itching   Sulfonamide Derivatives Itching   Tetanus Toxoids Swelling    Arm swelled at injection site     Review of Systems  Constitutional: Negative.   Eyes:  Negative for blurred vision.  Respiratory: Negative.  Negative for shortness of breath.   Cardiovascular: Negative.  Negative for chest pain and palpitations.  Gastrointestinal: Negative.   Musculoskeletal:  Positive for myalgias.       Leg pain   Neurological: Negative.     Today's Vitals   03/06/21 1045  BP: 120/60  Pulse: (!) 54  Temp: 98.3 F (36.8 C)  TempSrc: Oral  Weight: 216 lb 12.8 oz (98.3 kg)  Height: 5'  2" (1.575 m)  PainSc: 8    Body mass index is 39.65 kg/m.  Wt Readings from Last 3 Encounters:  03/06/21 216 lb 12.8 oz (98.3 kg)  11/29/20 214 lb (97.1 kg)  08/28/20 217 lb 3.2 oz (98.5 kg)    Objective:  Physical Exam Vitals and nursing note reviewed.  Constitutional:      Appearance: Normal appearance.  HENT:     Head: Normocephalic and atraumatic.     Nose:     Comments: Masked     Mouth/Throat:     Comments: Masked  Cardiovascular:     Rate and Rhythm: Normal rate and regular rhythm.     Pulses:          Dorsalis pedis pulses are 2+ on the right side and 2+ on the left side.     Heart sounds: Normal heart sounds.  Pulmonary:     Effort: Pulmonary effort is normal.     Breath sounds: Normal breath sounds.  Musculoskeletal:     Cervical back: Normal range of motion.  Feet:     Right foot:     Protective Sensation: 5 sites tested.  5 sites sensed.     Skin integrity: Skin integrity normal.     Toenail Condition: Right toenails are normal.     Left foot:     Protective Sensation: 5 sites tested.  5 sites sensed.     Skin integrity: Skin integrity normal.     Toenail Condition: Left toenails are normal.  Skin:    General: Skin is  warm.  Neurological:     General: No focal deficit present.     Mental Status: She is alert.  Psychiatric:        Mood and Affect: Mood normal.        Behavior: Behavior normal.        Assessment And Plan:     1. Uncontrolled type 2 diabetes mellitus with hyperglycemia ([place]) Comments: Diabetic foot exam was performed.  Chronic, I will check labs as listed. I will adjust meds as needed. - Hemoglobin A1c - CMP14+EGFR - Lipid panel  2. Hypertensive heart disease without heart failure Comments: Chronic, well controlled. She is encouraged to limit her sodium intake. No med changes today.  3. Aortic atherosclerosis ([place]) Comments: Ct angio results from 2019 reviewed in detail. Importance of statin compliance and living heart healthy lifestyle was d/w pt in detail.  - Lipid panel  4. Atherosclerosis of native coronary artery of native heart without angina pectoris Comments: Please see above.   5. Vitamin D deficiency disease Comments: I will check vitamin D level today. I will send rx 50000 units to take twice weekly. Pt advised that I will adjust dosing schedule based on her results.  - Vitamin D (25 hydroxy)  6. Class 2 severe obesity due to excess calories with serious comorbidity and body mass index (BMI) of 39.0 to 39.9 in adult [facility]) Comments: She is encouraged to aim for at least 150 minutes of exercise per week.   7. History of influenza vaccine allergy   Patient was given opportunity to ask questions. Patient verbalized understanding of the plan and was able to repeat key elements of the plan. All questions were answered to their satisfaction.    I, [name], MD, have reviewed all documentation for this visit. The documentation on 03/06/21 for the exam, diagnosis, procedures, and orders are all accurate and complete.   IF YOU HAVE BEEN REFERRED TO  A SPECIALIST, IT MAY TAKE 1-2 WEEKS TO SCHEDULE/PROCESS THE REFERRAL. IF YOU HAVE NOT HEARD FROM US/SPECIALIST IN TWO  WEEKS, PLEASE GIVE [place] A CALL AT [phone].   THE PATIENT IS ENCOURAGED TO PRACTICE SOCIAL DISTANCING DUE TO THE COVID-19 PANDEMIC.

## 2021-03-07 LAB — CMP14+EGFR
ALT: 11 IU/L (ref 0–32)
AST: 14 IU/L (ref 0–40)
Albumin/Globulin Ratio: 1.6 (ref 1.2–2.2)
Albumin: 4.1 g/dL (ref 3.7–4.7)
Alkaline Phosphatase: 86 IU/L (ref 44–121)
BUN/Creatinine Ratio: 14 (ref 12–28)
BUN: 10 mg/dL (ref 8–27)
Bilirubin Total: 0.3 mg/dL (ref 0.0–1.2)
CO2: 25 mmol/L (ref 20–29)
Calcium: 9.2 mg/dL (ref 8.7–10.3)
Chloride: 104 mmol/L (ref 96–106)
Creatinine, Ser: 0.74 mg/dL (ref 0.57–1.00)
Globulin, Total: 2.5 g/dL (ref 1.5–4.5)
Glucose: 107 mg/dL — ABNORMAL HIGH (ref 65–99)
Potassium: 4.7 mmol/L (ref 3.5–5.2)
Sodium: 144 mmol/L (ref 134–144)
Total Protein: 6.6 g/dL (ref 6.0–8.5)
eGFR: 84 mL/min/{1.73_m2} (ref >59)

## 2021-03-07 LAB — HEMOGLOBIN A1C
Est. average glucose Bld gHb Est-mCnc: 137 mg/dL
Hgb A1c MFr Bld: 6.4 % — ABNORMAL HIGH (ref 4.8–5.6)

## 2021-03-07 LAB — LIPID PANEL
Chol/HDL Ratio: 2.5 ratio (ref 0.0–4.4)
Cholesterol, Total: 176 mg/dL (ref 100–199)
HDL: 70 mg/dL (ref >39)
LDL Chol Calc (NIH): 91 mg/dL (ref 0–99)
Triglycerides: 79 mg/dL (ref 0–149)
VLDL Cholesterol Cal: 15 mg/dL (ref 5–40)

## 2021-03-07 LAB — VITAMIN D 25 HYDROXY (VIT D DEFICIENCY, FRACTURES): Vit D, 25-Hydroxy: 41.9 ng/mL (ref 30.0–100.0)

## 2021-03-14 ENCOUNTER — Other Ambulatory Visit: Payer: Self-pay | Admitting: Internal Medicine

## 2021-04-02 ENCOUNTER — Encounter: Payer: Self-pay | Admitting: Cardiology

## 2021-04-02 ENCOUNTER — Other Ambulatory Visit: Payer: Self-pay

## 2021-04-02 ENCOUNTER — Ambulatory Visit: Payer: Medicare HMO | Admitting: Cardiology

## 2021-04-02 VITALS — BP 126/73 | HR 50 | Temp 97.9°F | Resp 16 | Ht 62.0 in | Wt 212.2 lb

## 2021-04-02 DIAGNOSIS — I10 Essential (primary) hypertension: Secondary | ICD-10-CM | POA: Diagnosis not present

## 2021-04-02 DIAGNOSIS — R0609 Other forms of dyspnea: Secondary | ICD-10-CM | POA: Diagnosis not present

## 2021-04-02 DIAGNOSIS — E7800 Pure hypercholesterolemia, unspecified: Secondary | ICD-10-CM | POA: Diagnosis not present

## 2021-04-02 DIAGNOSIS — I700 Atherosclerosis of aorta: Secondary | ICD-10-CM | POA: Diagnosis not present

## 2021-04-02 DIAGNOSIS — Z01810 Encounter for preprocedural cardiovascular examination: Secondary | ICD-10-CM

## 2021-04-02 MED ORDER — ROSUVASTATIN CALCIUM 40 MG PO TABS: 40.0000 mg | ORAL_TABLET | Freq: Every day | ORAL | 3 refills | Status: DC

## 2021-04-02 NOTE — Progress Notes (Signed)
Primary Physician/Referring:  Dorothyann Peng, MD  Patient ID: [name], female    DOB: 09-28-1945, 75 y.o.   MRN: [id number]  No chief complaint on file.   HPI: [name]  is a 75 y.o. female female  with  diabetes mellitus, hypertension, hyperlipidemia, morbid obesity, nocturnal hypoxemia presently on oxygen supplementation, chronic dyspnea and palpitations. She also has GERD and hiatal hernia.  She underwent knee replacement October 2020She is here on annual visit, essentially remains asymptomatic, she is planning to have left knee total knee arthroplasty soon next month.  No significant change in her symptoms of palpitations that occur occasionally.  No PND or orthopnea.  No leg edema.  Denies chest pain,.  Past Medical History:  Diagnosis Date   Bladder infection    Carpal tunnel syndrome    Complication of anesthesia    Difficult to arouse   Diabetes mellitus    Hypertension    Kidney stone    Kidney stones    Sleep apnea     Past Surgical History:  Procedure Laterality Date   ABDOMINAL HYSTERECTOMY     partial   APPENDECTOMY     CARPAL TUNNEL RELEASE Bilateral 1980   cataract surgery Right 07/07/2020   Dr. Dione Booze   cataract surgery Left 12/2020   HERNIA REPAIR     TONSILLECTOMY     TOTAL KNEE ARTHROPLASTY Right 04/19/2019   Procedure: RIGHT TOTAL KNEE ARTHROPLASTY;  Surgeon: Gean Birchwood, MD;  Location: [facility];  Service: Orthopedics;  Laterality: Right;    Social History   Tobacco Use   Smoking status: Never   Smokeless tobacco: Never  Substance Use Topics   Alcohol use: No   Marital Status: Widowed   Review of Systems  Cardiovascular:  Positive for palpitations. Negative for claudication, dyspnea on exertion, leg swelling (chronic and stable) and syncope.  Musculoskeletal:  Positive for joint pain.  All other systems reviewed and are negative. Objective  Blood pressure 126/73, pulse (!) 50, temperature 97.9 F (36.6 C), temperature source  Temporal, resp. rate 16, height 5\' 2"  (1.575 m), weight 212 lb 3.2 oz (96.3 kg), SpO2 97 %. Body mass index is 38.81 kg/m.  Vitals with BMI 04/02/2021 03/06/2021 11/29/2020  Height 5\' 2"  5\' 2"  5\' 2"   Weight 212 lbs 3 oz 216 lbs 13 oz 214 lbs  BMI 38.8 39.64 39.13  Systolic 126 120 01/29/2021  Diastolic 73 60 64  Pulse 50 54 54    Physical Exam Constitutional:      Appearance: She is obese.  Cardiovascular:     Rate and Rhythm: Normal rate and regular rhythm.     Pulses:          Carotid pulses are 2+ on the right side and 2+ on the left side.      Femoral pulses are 2+ on the right side and 2+ on the left side.      Popliteal pulses are 2+ on the right side and 2+ on the left side.       Dorsalis pedis pulses are 2+ on the right side and 2+ on the left side.       Posterior tibial pulses are 2+ on the right side and 0 on the left side.     Heart sounds: Normal heart sounds. No murmur heard.   No gallop.     Comments: Trace leg edema, no JVD. Pulmonary:     Effort: Pulmonary effort is normal.  Breath sounds: Normal breath sounds.  Abdominal:     General: Bowel sounds are normal.     Palpations: Abdomen is soft.   Radiology: No results found.  Laboratory examination:    CMP Latest Ref Rng & Units 03/06/2021 11/29/2020 06/26/2020  Glucose 65 - 99 mg/dL 009(F) 99 818(E)  BUN 8 - 27 mg/dL 10 11 12   Creatinine 0.57 - 1.00 mg/dL 9.93 7.16  Sodium 134 - 144 mmol/L 144 142 146(H)  Potassium 3.5 - 5.2 mmol/L 4.7 4.5 4.3  Chloride 96 - 106 mmol/L 104 103 108(H)  CO2 20 - 29 mmol/L 25 25 25   Calcium 8.7 - 10.3 mg/dL 9.2 9.3 9.4  Total Protein 6.0 - 8.5 g/dL 6.6 - 6.7  Total Bilirubin 0.0 - 1.2 mg/dL 0.3 - 0.3  Alkaline Phos 44 - 121 IU/L 86 - 100  AST 0 - 40 IU/L 14 - 14  ALT 0 - 32 IU/L 11 - 13   CBC Latest Ref Rng & Units 06/26/2020 04/22/2019 04/21/2019  WBC 3.4 - 10.8 x10E3/uL 6.5 10.5 11.0(H)  Hemoglobin 11.1 - 15.9 g/dL 04/24/2019 10.2(L) 10.0(L)  Hematocrit 34.0 - 46.6 % 38.4  32.9(L) 32.1(L)  Platelets 150 - 450 x10E3/uL 255 205 217   Lipid Panel Recent Labs    04/10/20 1149 03/06/21 1143  CHOL 187 176  TRIG 51 79  LDLCALC 101* 91  HDL 76 70  CHOLHDL 2.5 2.5    HEMOGLOBIN 04/12/20 Lab Results  Component Value Date   HGBA1C 6.4 (H) 03/06/2021   MPG 128.37 04/19/2019   TSH No results for input(s): TSH in the last 8760 hours.   BNP (last 3 results) No results for input(s): BNP in the last 8760 hours.  ProBNP (last 3 results) No results for input(s): PROBNP in the last 8760 hours.  Outpatient Medications Prior to Visit  Medication Sig Dispense Refill   Ascorbic Acid (VITAMIN C) 1000 MG tablet Take 1,000 mg by mouth daily.     aspirin EC 81 MG tablet Take 1 tablet (81 mg total) by mouth 2 (two) times daily. (Patient taking differently: Take 81 mg by mouth every other day.) 60 tablet 0   Bacillus Coagulans-Inulin (PROBIOTIC FORMULA PO) Take 30 mg by mouth daily.      carvedilol (COREG) 3.125 MG tablet carvedilol 3.125 mg tablet  Take 1 tablet twice a day by oral route. 180 tablet 1   cetirizine (ZYRTEC) 10 MG tablet Take 10 mg by mouth daily as needed for allergies.     clotrimazole-betamethasone (LOTRISONE) cream Apply topically 2 (two) times daily as needed. 30 g 1   Coenzyme Q10 (COQ10) 100 MG CAPS Take 100 mg by mouth daily with lunch.      estradiol (ESTRACE) 0.1 MG/GM vaginal cream estradiol 0.01% (0.1 mg/gram) vaginal cream  USE A SMALL PEA SIZED AMOUNT ON VULVA DAILY FOR 2 WEEKS     famotidine (PEPCID) 20 MG tablet Take 1 tablet (20 mg total) by mouth 2 (two) times daily. 180 tablet 1   fluticasone (FLONASE) 50 MCG/ACT nasal spray Place 1 spray into both nostrils daily. (Patient taking differently: Place 1 spray into both nostrils daily as needed for allergies.) 16 g 2   furosemide (LASIX) 20 MG tablet Take 1 tablet (20 mg total) by mouth daily as needed (fluid). 90 tablet 1   hydrochlorothiazide (MICROZIDE) 12.5 MG capsule Take 12.5 mg by mouth  daily as needed (excess fluid/swelling).      loratadine (CLARITIN) 10 MG tablet  Take 10 mg by mouth daily.     losartan (COZAAR) 25 MG tablet Take 1 tablet (25 mg total) by mouth 2 (two) times a week. Take one tablet (25 mg) by mouth twice weekly - Tuesdays and Thursdays 36 tablet 3   meloxicam (MOBIC) 15 MG tablet Take 15 mg by mouth daily as needed.      metFORMIN (GLUCOPHAGE) 500 MG tablet TAKE 1 TABLET BY MOUTH TWICE DAILY WITH A MEAL 180 tablet 0   OneTouch Delica Lancets 33G MISC USE   TO CHECK GLUCOSE BEFORE BREAKFAST AND  DINNER 100 each 0   potassium chloride SA (KLOR-CON) 20 MEQ tablet Take 1 tablet (20 mEq total) by mouth every morning. 90 tablet 1   Vitamin D, Ergocalciferol, (DRISDOL) 1.25 MG (50000 UNIT) CAPS capsule Take one capsule twice weekly on Tues/Friday 24 capsule 0   rosuvastatin (CRESTOR) 20 MG tablet Take 1 tablet (20 mg total) by mouth daily. 90 tablet 3   Cholecalciferol (VITAMIN D3) 50 MCG (2000 UT) capsule Take 1 capsule by mouth daily at 12 noon.      No facility-administered medications prior to visit.    Cardiac Studies:   ABI 04/01/2013: This exam reveals normal perfusion of both the lower extremities with bilateral ABI of 1.04.  Abdominal Aortic Duplex  03/06/2020: Mild heterogeneous plaque noted in the abdominal aorta. The maximum aorta (sac) diameter is 2.26 cm (dist). with minimal ectasia.  No AAA observed.  Bilateral internal iliac artery velocity mildly elevated suggesting <50% stenosis.  Lexiscan myoview stress test 08/18/2017:  1. Pharmacologic stress testing was performed with intravenous administration of .4 mg of Lexiscan over a 10-15 seconds infusion. Patient was hypertensive through out the study, max BP 160/88 mmHg. Stress symptoms included dyspnea, nausea. Exercise capacity not assessed. Stress EKG is non diagnostic for ischemia as it is a pharmacologic stress.  2. The overall quality of the study is good.  Left ventricular cavity is noted to be  normal on the rest and stress studies.  Review of the raw data in a rotational cine format reveals breast attenuation, with imaging performed in sitting position. Gated SPECT images reveal normal myocardial thickening and wall motion.  The left ventricular ejection fraction was calculated or visually estimated to be 62%.  REST and STRESS images demonstrate medium sized area of moderately decreased tracer uptake in the basal inferoseptal, mid inferoseptal and apical inferior segments of the left ventricle. Defect improves on stress images. Defects are likely related to breast attenuation. Superimposed ischemia in this region cannot be excluded.  3. This is a low risk study.  Echocardiogram 02/25/2020: Normal LV systolic function with visual EF 60-65%. Left ventricle cavity is normal in size. Mild concentric hypertrophy of the left ventricle. Normal global wall motion. Normal diastolic filling pattern, normal LAP. Calculated EF 57%. Mild (Grade I) mitral regurgitation. Mild tricuspid regurgitation. No evidence of pulmonary hypertension. Mild pulmonic regurgitation. IVC is dilated with respiratory variation. No significant change compared to prior study dated 08/26/2017.   EKG:  EKG 04/02/2021: Marked sinus bradycardia, 48 bpm, normal axis, nonspecific T wave flattening.  No significant change from 02/23/2020.  Previously heart rate was 59 bpm.  ity.  No significant change from 06/09/2019.  Assessment   Pre-operative cardiovascular examination  Primary hypertension - Plan: EKG 12-Lead, TSH  Dyspnea on exertion  Abdominal aortic atherosclerosis (HCC)  Pure hypercholesterolemia - Plan: rosuvastatin (CRESTOR) 40 MG tablet   Medications Discontinued During This Encounter  Medication Reason   Cholecalciferol (  VITAMIN D3) 50 MCG (2000 UT) capsule Error   rosuvastatin (CRESTOR) 20 MG tablet Reorder    Meds ordered this encounter  Medications   rosuvastatin (CRESTOR) 40 MG tablet    Sig: Take 1  tablet (40 mg total) by mouth daily.    Dispense:  90 tablet    Refill:  3    Discontinue Pravastatin   Recommendations:   [name]  is a 75 y.o.  female  with  diabetes mellitus, hypertension, hyperlipidemia, morbid obesity, nocturnal hypoxemia presently on oxygen supplementation, chronic dyspnea and palpitations. She also has GERD and hiatal hernia.  She underwent knee replacement October 2020She is here on annual visit, essentially remains asymptomatic, she is planning to have left knee total knee arthroplasty soon next month.  No clinical evidence of heart failure, blood pressure is well controlled, she does have occasional palpitation.  She has underlying sinus bradycardia and she is on a very low-dose of carvedilol, continue the same for now in view of palpitations I do not want to discontinue the medication.  In view of diabetic state, abdominal aortic atherosclerosis, LDL goal would be <70.  We discussed regarding addition of Zetia versus increasing the dose of rosuvastatin to 40 mg.  We also discussed regarding weight loss and lifestyle changes.  Patient prefers to increase the dose of Crestor, refill sent with 40 mg.  Otherwise she is stable from cardiac standpoint, I would like to obtain a TSH in view of bradycardia, she is asymptomatic from this.  From cardiac standpoint, she can be taken up for the surgery with low risk.   Yates Decamp, MD, [facility] 04/02/2021, 11:22 AM Office: [phone]

## 2021-04-03 DIAGNOSIS — G4733 Obstructive sleep apnea (adult) (pediatric): Secondary | ICD-10-CM | POA: Diagnosis not present

## 2021-04-03 DIAGNOSIS — R0602 Shortness of breath: Secondary | ICD-10-CM | POA: Diagnosis not present

## 2021-04-03 DIAGNOSIS — R0600 Dyspnea, unspecified: Secondary | ICD-10-CM | POA: Diagnosis not present

## 2021-04-03 DIAGNOSIS — I10 Essential (primary) hypertension: Secondary | ICD-10-CM | POA: Diagnosis not present

## 2021-04-04 ENCOUNTER — Ambulatory Visit: Payer: Medicare HMO | Admitting: Internal Medicine

## 2021-04-06 ENCOUNTER — Other Ambulatory Visit: Payer: Self-pay | Admitting: Orthopedic Surgery

## 2021-04-06 DIAGNOSIS — Z01818 Encounter for other preprocedural examination: Secondary | ICD-10-CM

## 2021-04-15 ENCOUNTER — Encounter: Payer: Self-pay | Admitting: Cardiology

## 2021-04-20 ENCOUNTER — Telehealth: Payer: Self-pay

## 2021-04-20 NOTE — Chronic Care Management (AMB) (Signed)
Chronic Care Management Pharmacy Assistant   Name: [name]  MRN: [id number] DOB: 13-Mar-1946   Reason for Encounter: Disease State/ Diabetes  Recent office visits:  11-29-2020 Dorothyann Peng, MD. START Pepcid 20 mg twice daily.  03-06-2021 Dorothyann Peng, MD. Glucose= 107, A1C= 6.4. FINISHED Nitrofurantoin. START Vitamin D 1.25 mg on Tuesday and Friday.  Recent consult visits:  11-23-2020 Noel Christmas, MD (Urology). Unable to view encounter.  12-13-2020 Vanessa Barbara, MD (Urology). Unable to view encounter.  12-21-2020 Lennie Odor (Radiology). Unable to view encounter.  12-27-2020 Olivia Canter, MD (Ophthalmology). Unable to view encounter.  12-28-2020 Bartholomew Crews L. Unable to view encounter.  01-03-2021 Olivia Canter, MD (Ophthalmology). Unable to view encounter.  01-05-2021 Olivia Canter, MD (Ophthalmology). Unable to view encounter.  01-16-2021 Bartholomew Crews L. Unable to view encounter.  02-08-2021 Gean Birchwood, MD. Unable to view encounter.  04-02-2021 Yates Decamp, MD (Cardiology). INCREASE rosuvastatin 20 mg daily TO 40 mg daily.  Hospital visits:  None in previous 6 months  Medications: Outpatient Encounter Medications as of 04/20/2021  Medication Sig   Ascorbic Acid (VITAMIN C) 1000 MG tablet Take 1,000 mg by mouth daily.   aspirin EC 81 MG tablet Take 1 tablet (81 mg total) by mouth 2 (two) times daily. (Patient taking differently: Take 81 mg by mouth every other day.)   Bacillus Coagulans-Inulin (PROBIOTIC FORMULA PO) Take 30 mg by mouth daily.    carvedilol (COREG) 3.125 MG tablet carvedilol 3.125 mg tablet  Take 1 tablet twice a day by oral route.   cetirizine (ZYRTEC) 10 MG tablet Take 10 mg by mouth daily as needed for allergies.   clotrimazole-betamethasone (LOTRISONE) cream Apply topically 2 (two) times daily as needed.   Coenzyme Q10 (COQ10) 100 MG CAPS Take 100 mg by mouth daily with lunch.    estradiol  (ESTRACE) 0.1 MG/GM vaginal cream estradiol 0.01% (0.1 mg/gram) vaginal cream  USE A SMALL PEA SIZED AMOUNT ON VULVA DAILY FOR 2 WEEKS   famotidine (PEPCID) 20 MG tablet Take 1 tablet (20 mg total) by mouth 2 (two) times daily.   fluticasone (FLONASE) 50 MCG/ACT nasal spray Place 1 spray into both nostrils daily. (Patient taking differently: Place 1 spray into both nostrils daily as needed for allergies.)   furosemide (LASIX) 20 MG tablet Take 1 tablet (20 mg total) by mouth daily as needed (fluid).   hydrochlorothiazide (MICROZIDE) 12.5 MG capsule Take 12.5 mg by mouth daily as needed (excess fluid/swelling).    loratadine (CLARITIN) 10 MG tablet Take 10 mg by mouth daily.   losartan (COZAAR) 25 MG tablet Take 1 tablet (25 mg total) by mouth 2 (two) times a week. Take one tablet (25 mg) by mouth twice weekly - Tuesdays and Thursdays   meloxicam (MOBIC) 15 MG tablet Take 15 mg by mouth daily as needed.    metFORMIN (GLUCOPHAGE) 500 MG tablet TAKE 1 TABLET BY MOUTH TWICE DAILY WITH A MEAL   OneTouch Delica Lancets 33G MISC USE   TO CHECK GLUCOSE BEFORE BREAKFAST AND  DINNER   potassium chloride SA (KLOR-CON) 20 MEQ tablet Take 1 tablet (20 mEq total) by mouth every morning.   rosuvastatin (CRESTOR) 40 MG tablet Take 1 tablet (40 mg total) by mouth daily.   Vitamin D, Ergocalciferol, (DRISDOL) 1.25 MG (50000 UNIT) CAPS capsule Take one capsule twice weekly on Tues/Friday   No facility-administered encounter medications on file as of 04/20/2021.   Recent Relevant Labs: Lab  Results  Component Value Date/Time   HGBA1C 6.4 (H) 03/06/2021 11:43 AM   HGBA1C 6.4 (H) 11/29/2020 12:50 PM   MICROALBUR 10 06/26/2020 01:20 PM   MICROALBUR 10 06/09/2019 04:57 PM    Kidney Function Lab Results  Component Value Date/Time   CREATININE 0.74 03/06/2021 11:43 AM   CREATININE 0.75 11/29/2020 12:53 PM   GFRNONAA 82 06/26/2020 11:44 AM   GFRAA 95 06/26/2020 11:44 AM    Current antihyperglycemic regimen:   Metformin 500 mg taking 1 tablet by mouth two times a day with a meal   What recent interventions/DTPs have been made to improve glycemic control:  Educated onA1c and blood sugar goals; Complications of diabetes including kidney damage, retinal damage, and cardiovascular disease; Exercise goal of 150 minutes per week; Prevention and management of hypoglycemic episodes; -Counseled to check feet daily and get yearly eye exams Have there been any recent hospitalizations or ED visits since last visit with CPP? No  Patient denies hypoglycemic symptoms  Patient denies hyperglycemic symptoms  How often are you checking your blood sugar? once daily  What are your blood sugars ranging?  Fasting: 98 Before meals: None After meals: None Bedtime: None  During the week, how often does your blood glucose drop below 70? Never  Are you checking your feet daily/regularly? Daily  Adherence Review: Is the patient currently on a STATIN medication? Yes Is the patient currently on ACE/ARB medication? Yes Does the patient have >5 day gap between last estimated fill dates? No  NOTES: Patient states she has been doing great with blood sugars and will call back to schedule appointment with Cherylin Mylar.  Care Gaps: PNA Vac overdue Yearly foot exam overdue last completed 06-09-2019 Covid booster last completed 06-13-2020 AWV 06-13-2021  Star Rating Drugs: Rosuvastatin 40 mg- Last filled 04-02-2021 [facility] Metformin 500 mg- Last filled 03-15-2021 [facility] Losartan 25 mg- Last filled 04-07-2021 [facility]  [facility] CMA Clinical Pharmacist Assistant [phone]

## 2021-04-25 DIAGNOSIS — N3000 Acute cystitis without hematuria: Secondary | ICD-10-CM | POA: Diagnosis not present

## 2021-05-04 DIAGNOSIS — R0602 Shortness of breath: Secondary | ICD-10-CM | POA: Diagnosis not present

## 2021-05-04 DIAGNOSIS — I10 Essential (primary) hypertension: Secondary | ICD-10-CM | POA: Diagnosis not present

## 2021-05-04 DIAGNOSIS — R0600 Dyspnea, unspecified: Secondary | ICD-10-CM | POA: Diagnosis not present

## 2021-05-04 DIAGNOSIS — G4733 Obstructive sleep apnea (adult) (pediatric): Secondary | ICD-10-CM | POA: Diagnosis not present

## 2021-05-08 NOTE — Patient Instructions (Addendum)
DUE TO COVID-19 ONLY ONE VISITOR IS ALLOWED TO COME WITH YOU AND STAY IN THE WAITING ROOM ONLY DURING PRE OP AND PROCEDURE DAY OF SURGERY IF YOU ARE GOING HOME AFTER SURGERY. IF YOU ARE SPENDING THE NIGHT 2 PEOPLE MAY VISIT WITH YOU IN YOUR PRIVATE ROOM AFTER SURGERY UNTIL VISITING  HOURS ARE OVER AT 800 PM AND 1  VISITOR  MAY  SPEND THE NIGHT.                 [name]     Your procedure is scheduled on: 05/21/21   Report to [facility] Main  Entrance   Report to Short stay at 5:15AM     Call this number if you have problems the morning of surgery [phone]    No food after midnight.    You may have clear liquid until 4:30 AM.    At 4:00 AM drink pre surgery drink.   Nothing by mouth after 4:30 AM.   CLEAR LIQUID DIET   Foods Allowed                                                                     Foods Excluded                                                                                      liquids that you cannot  Plain Jell-O any favor except red or purple                                           see through such as: Fruit ices (not with fruit pulp)                                     milk, soups, orange juice  Iced Popsicles                                    All solid food Carbonated beverages, regular and diet                                    Cranberry, grape and apple juices Sports drinks like Gatorade Lightly seasoned clear broth or consume(fat free) Sugar    BRUSH YOUR TEETH MORNING OF SURGERY AND RINSE YOUR MOUTH OUT, NO CHEWING GUM CANDY OR MINTS.     Take these medicines the morning of surgery with A SIP OF WATER: Carvedilol. Pepcid                Bring your mask and tubing if you have one.  Stop taking __ASA 81mg _________on __________as instructed by _____________.  Contact your Surgeon/Cardiologist for instructions on Anticoagulant Therapy prior to surgery.                          How to Manage Your Diabetes Before and  After Surgery  Why is it important to control my blood sugar before and after surgery? Improving blood sugar levels before and after surgery helps healing and can limit problems. A way of improving blood sugar control is eating a healthy diet by:  Eating less sugar and carbohydrates  Increasing activity/exercise  Talking with your doctor about reaching your blood sugar goals High blood sugars (greater than 180 mg/dL) can raise your risk of infections and slow your recovery, so you will need to focus on controlling your diabetes during the weeks before surgery. Make sure that the doctor who takes care of your diabetes knows about your planned surgery including the date and location.  How do I manage my blood sugar before surgery? Check your blood sugar at least 4 times a day, starting 2 days before surgery, to make sure that the level is not too high or low. Check your blood sugar the morning of your surgery when you wake up and every 2 hours until you get to the Short Stay unit. If your blood sugar is less than 70 mg/dL, you will need to treat for low blood sugar: Do not take insulin. Treat a low blood sugar (less than 70 mg/dL) with  cup of clear juice (cranberry or apple), 4 glucose tablets, OR glucose gel. Recheck blood sugar in 15 minutes after treatment (to make sure it is greater than 70 mg/dL). If your blood sugar is not greater than 70 mg/dL on recheck, call [phone] for further instructions. Report your blood sugar to the short stay nurse when you get to Short Stay.  If you are admitted to the hospital after surgery: Your blood sugar will be checked by the staff and you will probably be given insulin after surgery (instead of oral diabetes medicines) to make sure you have good blood sugar levels. The goal for blood sugar control after surgery is 80-180 mg/dL.   WHAT DO I DO ABOUT MY DIABETES MEDICATION?  Do not take oral diabetes medicines (pills) the morning of surgery.              You may not have any metal on your body including hair pins and              piercings  Do not wear jewelry, make-up, lotions, powders or perfumes, deodorant             Do not wear nail polish on your fingernails.  Do not shave  48 hours prior to surgery.                Do not bring valuables to the hospital. [facility] IS NOT             RESPONSIBLE   FOR VALUABLES.  Contacts, dentures or bridgework may not be worn into surgery.       Patients discharged the day of surgery will not be allowed to drive home.  IF YOU ARE HAVING SURGERY AND GOING HOME THE SAME DAY, YOU MUST HAVE AN ADULT TO DRIVE YOU HOME AND BE WITH YOU FOR 24 HOURS. YOU MAY GO HOME BY TAXI OR UBER OR ORTHERWISE, BUT AN ADULT MUST ACCOMPANY YOU HOME  AND STAY WITH YOU FOR 24 HOURS.  Name and phone number of your driver:  Special Instructions: N/A              Please read over the following fact sheets you were given: _____________________________________________________________________             [facility] - Preparing for Surgery Before surgery, you can play an important role.  Because skin is not sterile, your skin needs to be as free of germs as possible.  You can reduce the number of germs on your skin by washing with CHG (chlorahexidine gluconate) soap before surgery.  CHG is an antiseptic cleaner which kills germs and bonds with the skin to continue killing germs even after washing. Please DO NOT use if you have an allergy to CHG or antibacterial soaps.  If your skin becomes reddened/irritated stop using the CHG and inform your nurse when you arrive at Short Stay. Do not shave (including legs and underarms) for at least 48 hours prior to the first CHG shower.  . Please follow these instructions carefully:  1.  Shower with CHG Soap the night before surgery and the  morning of Surgery.  2.  If you choose to wash your hair, wash your hair first as usual with your  normal  shampoo.  3.  After you shampoo,  rinse your hair and body thoroughly to remove the  shampoo.                            4.  Use CHG as you would any other liquid soap.  You can apply chg directly  to the skin and wash                       Gently with a scrungie or clean washcloth.  5.  Apply the CHG Soap to your body ONLY FROM THE NECK DOWN.   Do not use on face/ open                           Wound or open sores. Avoid contact with eyes, ears mouth and genitals (private parts).                       Wash face,  Genitals (private parts) with your normal soap.             6.  Wash thoroughly, paying special attention to the area where your surgery  will be performed.  7.  Thoroughly rinse your body with warm water from the neck down.  8.  DO NOT shower/wash with your normal soap after using and rinsing off  the CHG Soap.                9.  Pat yourself dry with a clean towel.            10.  Wear clean pajamas.            11.  Place clean sheets on your bed the night of your first shower and do not  sleep with pets. Day of Surgery : Do not apply any lotions/deodorants the morning of surgery.  Please wear clean clothes to the hospital/surgery center.  FAILURE TO FOLLOW THESE INSTRUCTIONS MAY RESULT IN THE CANCELLATION OF YOUR SURGERY PATIENT SIGNATURE_________________________________  NURSE SIGNATURE__________________________________  ________________________________________________________________________   [name]  An  incentive spirometer is a tool that can help keep your lungs clear and active. This tool measures how well you are filling your lungs with each breath. Taking long deep breaths may help reverse or decrease the chance of developing breathing (pulmonary) problems (especially infection) following: A long period of time when you are unable to move or be active. BEFORE THE PROCEDURE  If the spirometer includes an indicator to show your best effort, your nurse or respiratory therapist will set it to  a desired goal. If possible, sit up straight or lean slightly forward. Try not to slouch. Hold the incentive spirometer in an upright position. INSTRUCTIONS FOR USE  Sit on the edge of your bed if possible, or sit up as far as you can in bed or on a chair. Hold the incentive spirometer in an upright position. Breathe out normally. Place the mouthpiece in your mouth and seal your lips tightly around it. Breathe in slowly and as deeply as possible, raising the piston or the ball toward the top of the column. Hold your breath for 3-5 seconds or for as long as possible. Allow the piston or ball to fall to the bottom of the column. Remove the mouthpiece from your mouth and breathe out normally. Rest for a few seconds and repeat Steps 1 through 7 at least 10 times every 1-2 hours when you are awake. Take your time and take a few normal breaths between deep breaths. The spirometer may include an indicator to show your best effort. Use the indicator as a goal to work toward during each repetition. After each set of 10 deep breaths, practice coughing to be sure your lungs are clear. If you have an incision (the cut made at the time of surgery), support your incision when coughing by placing a pillow or rolled up towels firmly against it. Once you are able to get out of bed, walk around indoors and cough well. You may stop using the incentive spirometer when instructed by your caregiver.  RISKS AND COMPLICATIONS Take your time so you do not get dizzy or light-headed. If you are in pain, you may need to take or ask for pain medication before doing incentive spirometry. It is harder to take a deep breath if you are having pain. AFTER USE Rest and breathe slowly and easily. It can be helpful to keep track of a log of your progress. Your caregiver can provide you with a simple table to help with this. If you are using the spirometer at home, follow these instructions: SEEK MEDICAL CARE IF:  You are having  difficultly using the spirometer. You have trouble using the spirometer as often as instructed. Your pain medication is not giving enough relief while using the spirometer. You develop fever of 100.5 F (38.1 C) or higher. SEEK IMMEDIATE MEDICAL CARE IF:  You cough up bloody sputum that had not been present before. You develop fever of 102 F (38.9 C) or greater. You develop worsening pain at or near the incision site. MAKE SURE YOU:  Understand these instructions. Will watch your condition. Will get help right away if you are not doing well or get worse. Document Released: 10/21/2006 Document Revised: 09/02/2011 Document Reviewed: 12/22/2006 [facility] Patient Information 2014 [place], [place].   ________________________________________________________________________

## 2021-05-09 ENCOUNTER — Other Ambulatory Visit: Payer: Self-pay

## 2021-05-09 ENCOUNTER — Encounter (HOSPITAL_COMMUNITY)
Admission: RE | Admit: 2021-05-09 | Discharge: 2021-05-09 | Disposition: A | Discharge status: Home / Self Care | Payer: Medicare HMO | Source: Ambulatory Visit | Attending: Orthopedic Surgery | Admitting: Orthopedic Surgery

## 2021-05-09 ENCOUNTER — Encounter (HOSPITAL_COMMUNITY): Payer: Self-pay

## 2021-05-09 ENCOUNTER — Ambulatory Visit (HOSPITAL_COMMUNITY)
Admission: RE | Admit: 2021-05-09 | Discharge: 2021-05-09 | Disposition: A | Discharge status: Home / Self Care | Payer: Medicare HMO | Source: Ambulatory Visit | Attending: Orthopedic Surgery | Admitting: Orthopedic Surgery

## 2021-05-09 VITALS — BP 144/70 | HR 66 | Temp 97.6°F | Resp 18 | Ht 63.0 in | Wt 212.0 lb

## 2021-05-09 DIAGNOSIS — E669 Obesity, unspecified: Secondary | ICD-10-CM | POA: Insufficient documentation

## 2021-05-09 DIAGNOSIS — M1712 Unilateral primary osteoarthritis, left knee: Secondary | ICD-10-CM | POA: Diagnosis not present

## 2021-05-09 DIAGNOSIS — G4730 Sleep apnea, unspecified: Secondary | ICD-10-CM | POA: Insufficient documentation

## 2021-05-09 DIAGNOSIS — K219 Gastro-esophageal reflux disease without esophagitis: Secondary | ICD-10-CM | POA: Diagnosis not present

## 2021-05-09 DIAGNOSIS — E1169 Type 2 diabetes mellitus with other specified complication: Secondary | ICD-10-CM | POA: Insufficient documentation

## 2021-05-09 DIAGNOSIS — Z01818 Encounter for other preprocedural examination: Secondary | ICD-10-CM

## 2021-05-09 DIAGNOSIS — I10 Essential (primary) hypertension: Secondary | ICD-10-CM | POA: Insufficient documentation

## 2021-05-09 HISTORY — DX: Gastro-esophageal reflux disease without esophagitis: K21.9

## 2021-05-09 HISTORY — DX: Unspecified osteoarthritis, unspecified site: M19.90

## 2021-05-09 LAB — SURGICAL PCR SCREEN
MRSA, PCR: NEGATIVE
Staphylococcus aureus: NEGATIVE

## 2021-05-09 LAB — URINALYSIS, ROUTINE W REFLEX MICROSCOPIC
Bilirubin Urine: NEGATIVE
Glucose, UA: NEGATIVE mg/dL
Ketones, ur: NEGATIVE mg/dL
Nitrite: NEGATIVE
Protein, ur: NEGATIVE mg/dL
Specific Gravity, Urine: 1.016 (ref 1.005–1.030)
WBC, UA: >50 WBC/hpf — ABNORMAL HIGH (ref 0–5)
pH: 6 (ref 5.0–8.0)

## 2021-05-09 LAB — CBC WITH DIFFERENTIAL/PLATELET
Abs Immature Granulocytes: 0.07 10*3/uL (ref 0.00–0.07)
Basophils Absolute: 0 10*3/uL (ref 0.0–0.1)
Basophils Relative: 0 %
Eosinophils Absolute: 0.1 10*3/uL (ref 0.0–0.5)
Eosinophils Relative: 2 %
HCT: 36.7 % (ref 36.0–46.0)
Hemoglobin: 11.3 g/dL — ABNORMAL LOW (ref 12.0–15.0)
Immature Granulocytes: 1 %
Lymphocytes Relative: 30 %
Lymphs Abs: 1.5 10*3/uL (ref 0.7–4.0)
MCH: 28.1 pg (ref 26.0–34.0)
MCHC: 30.8 g/dL (ref 30.0–36.0)
MCV: 91.3 fL (ref 80.0–100.0)
Monocytes Absolute: 0.5 10*3/uL (ref 0.1–1.0)
Monocytes Relative: 10 %
Neutro Abs: 2.9 10*3/uL (ref 1.7–7.7)
Neutrophils Relative %: 57 %
Platelets: 284 10*3/uL (ref 150–400)
RBC: 4.02 MIL/uL (ref 3.87–5.11)
RDW: 14.6 % (ref 11.5–15.5)
WBC: 5.1 10*3/uL (ref 4.0–10.5)
nRBC: 0 % (ref 0.0–0.2)

## 2021-05-09 LAB — BASIC METABOLIC PANEL
Anion gap: 5 (ref 5–15)
BUN: 9 mg/dL (ref 8–23)
CO2: 29 mmol/L (ref 22–32)
Calcium: 8.6 mg/dL — ABNORMAL LOW (ref 8.9–10.3)
Chloride: 110 mmol/L (ref 98–111)
Creatinine, Ser: 0.7 mg/dL (ref 0.44–1.00)
GFR, Estimated: >60 mL/min (ref >60)
Glucose, Bld: 124 mg/dL — ABNORMAL HIGH (ref 70–99)
Potassium: 4.2 mmol/L (ref 3.5–5.1)
Sodium: 144 mmol/L (ref 135–145)

## 2021-05-09 LAB — GLUCOSE, CAPILLARY: Glucose-Capillary: 126 mg/dL — ABNORMAL HIGH (ref 70–99)

## 2021-05-09 LAB — PROTIME-INR
INR: 1.1 (ref 0.8–1.2)
Prothrombin Time: 13.9 seconds (ref 11.4–15.2)

## 2021-05-09 LAB — APTT: aPTT: 32 seconds (ref 24–36)

## 2021-05-09 LAB — TYPE AND SCREEN
ABO/RH(D): O POS
Antibody Screen: NEGATIVE

## 2021-05-09 LAB — HEMOGLOBIN A1C
Hgb A1c MFr Bld: 6 % — ABNORMAL HIGH (ref 4.8–5.6)
Mean Plasma Glucose: 125.5 mg/dL

## 2021-05-09 NOTE — Progress Notes (Signed)
COVID test-NA    PCP - Dr. Maggie Font Cardiologist - none  Chest x-ray - no EKG - 04/02/21-epic Stress Test - no ECHO -02/26/20-epic  Cardiac Cath - no Pacemaker/ICD device last checked:NA  Sleep Study - yes CPAP - no. She uses O2 at 2 lt if she is very tired  Fasting Blood Sugar - 97-109 Checks Blood Sugar __BID___ times a day  Blood Thinner Instructions:ASA 81 mg/ Dr. Allyne Gee Aspirin Instructions:Stop 7 days prior to DOS/ Dr. Turner Daniels Last Dose:05/13/21  Anesthesia review: yes  Patient denies shortness of breath, fever, cough and chest pain at PAT appointment Pt has no SOB doing housework or with ADLs but sometimes if she overexerts.  Patient verbalized understanding of instructions that were given to them at the PAT appointment. Patient was also instructed that they will need to review over the PAT instructions again at home before surgery. yes

## 2021-05-11 NOTE — Progress Notes (Signed)
Anesthesia Chart Review   Case: [id number] Date/Time: 05/21/21 0815   Procedure: LEFT TOTAL KNEE ARTHROPLASTY (Left: Knee)   Anesthesia type: Spinal   Pre-op diagnosis: LEFT KNEE OSTEOARTHRITIS   Location: [place] [facility] / [facility]   Surgeons: Frederik Pear, MD       DISCUSSION:75 y.o. never smoker with h/o GERD, HTN, sleep apnea, left knee OA scheduled for above procedure 05/21/2021 with Dr. Frederik Pear.   Pt seen by cardiology 04/02/2021. Per OV note, "From cardiac standpoint, she can be taken up for the surgery with low risk."  Anticipate pt can proceed with planned procedure barring acute status change.   VS: BP (!) 144/70   Pulse 66   Temp 36.4 C (Oral)   Resp 18   Ht 5\' 3"  (1.6 m)   Wt 96.2 kg   SpO2 100%   BMI 37.55 kg/m   PROVIDERS: Glendale Chard, MD is PCP   Adrian Prows, MD is Cardiologist  LABS: Labs reviewed: Acceptable for surgery. (all labs ordered are listed, but only abnormal results are displayed)  Labs Reviewed  HEMOGLOBIN A1C - Abnormal; Notable for the following components:      Result Value   Hgb A1c MFr Bld 6.0 (*)    All other components within normal limits  CBC WITH DIFFERENTIAL/PLATELET - Abnormal; Notable for the following components:   Hemoglobin 11.3 (*)    All other components within normal limits  BASIC METABOLIC PANEL - Abnormal; Notable for the following components:   Glucose, Bld 124 (*)    Calcium 8.6 (*)    All other components within normal limits  URINALYSIS, ROUTINE W REFLEX MICROSCOPIC - Abnormal; Notable for the following components:   APPearance CLOUDY (*)    Hgb urine dipstick SMALL (*)    Leukocytes,Ua LARGE (*)    WBC, UA >50 (*)    Bacteria, UA MANY (*)    Non Squamous Epithelial 0-5 (*)    All other components within normal limits  GLUCOSE, CAPILLARY - Abnormal; Notable for the following components:   Glucose-Capillary 126 (*)    All other components within normal limits  SURGICAL PCR SCREEN  PROTIME-INR  APTT  TYPE AND  SCREEN     IMAGES:   EKG: EKG 04/02/2021: Marked sinus bradycardia, 48 bpm, normal axis, nonspecific T wave flattening.  No significant change from 02/23/2020.   CV: Lexiscan myoview stress test 08/18/2017:  1. Pharmacologic stress testing was performed with intravenous administration of .4 mg of Lexiscan over a 10-15 seconds infusion. Patient was hypertensive through out the study, max BP 160/88 mmHg. Stress symptoms included dyspnea, nausea. Exercise capacity not assessed. Stress EKG is non diagnostic for ischemia as it is a pharmacologic stress.  2. The overall quality of the study is good.  Left ventricular cavity is noted to be normal on the rest and stress studies.  Review of the raw data in a rotational cine format reveals breast attenuation, with imaging performed in sitting position. Gated SPECT images reveal normal myocardial thickening and wall motion.  The left ventricular ejection fraction was calculated or visually estimated to be 62%.  REST and STRESS images demonstrate medium sized area of moderately decreased tracer uptake in the basal inferoseptal, mid inferoseptal and apical inferior segments of the left ventricle. Defect improves on stress images. Defects are likely related to breast attenuation. Superimposed ischemia in this region cannot be excluded.  3. This is a low risk study.  Echocardiogram 02/25/2020:  Normal LV systolic function with  visual EF 60-65%. Left ventricle cavity  is normal in size. Mild concentric hypertrophy of the left ventricle.  Normal global wall motion. Normal diastolic filling pattern, normal LAP.  Calculated EF 57%.  Mild (Grade I) mitral regurgitation.  Mild tricuspid regurgitation. No evidence of pulmonary hypertension.  Mild pulmonic regurgitation.  IVC is dilated with respiratory variation.  No significant change compared to prior study dated 08/26/2017. Past Medical History:  Diagnosis Date   Arthritis    hands   Bladder infection     Carpal tunnel syndrome    Complication of anesthesia    Difficult to arouse   Diabetes mellitus    GERD (gastroesophageal reflux disease)    Hypertension    Sleep apnea     Past Surgical History:  Procedure Laterality Date   ABDOMINAL HYSTERECTOMY  1985   partial   APPENDECTOMY     age 66   CARPAL TUNNEL RELEASE Bilateral 1980   cataract surgery Right 07/07/2020   Dr. Dione Booze   cataract surgery Left 12/2020   HERNIA REPAIR  1990   TONSILLECTOMY     as a child   TOTAL KNEE ARTHROPLASTY Right 04/19/2019   Procedure: RIGHT TOTAL KNEE ARTHROPLASTY;  Surgeon: Gean Birchwood, MD;  Location: [facility];  Service: Orthopedics;  Laterality: Right;    MEDICATIONS:  Ascorbic Acid (VITAMIN C) 1000 MG tablet   aspirin EC 81 MG tablet   Bacillus Coagulans-Inulin (PROBIOTIC FORMULA PO)   carvedilol (COREG) 3.125 MG tablet   cetirizine (ZYRTEC) 10 MG tablet   Cholecalciferol (VITAMIN D) 50 MCG (2000 UT) tablet   clotrimazole-betamethasone (LOTRISONE) cream   Coenzyme Q10 (COQ10) 100 MG CAPS   estradiol (ESTRACE) 0.1 MG/GM vaginal cream   famotidine (PEPCID) 20 MG tablet   fluticasone (FLONASE) 50 MCG/ACT nasal spray   furosemide (LASIX) 20 MG tablet   hydrochlorothiazide (MICROZIDE) 12.5 MG capsule   loratadine (CLARITIN) 10 MG tablet   losartan (COZAAR) 25 MG tablet   metFORMIN (GLUCOPHAGE) 500 MG tablet   OneTouch Delica Lancets 33G MISC   OXYGEN   potassium chloride SA (KLOR-CON) 20 MEQ tablet   rosuvastatin (CRESTOR) 20 MG tablet   rosuvastatin (CRESTOR) 40 MG tablet   Vitamin D, Ergocalciferol, (DRISDOL) 1.25 MG (50000 UNIT) CAPS capsule   No current facility-administered medications for this encounter.    Jodell Cipro Ward, PA-C [facility] Pre-Surgical Testing [phone]

## 2021-05-11 NOTE — Anesthesia Preprocedure Evaluation (Addendum)
Anesthesia Evaluation  Patient identified by MRN, date of birth, ID band Patient awake    Reviewed: Allergy & Precautions, H&P , NPO status , Patient's Chart, lab work & pertinent test results, reviewed documented beta blocker date and time   Airway Mallampati: II  TM Distance: >3 FB Neck ROM: Full    Dental no notable dental hx. (+) Teeth Intact, Dental Advisory Given   Pulmonary sleep apnea and Oxygen sleep apnea ,    Pulmonary exam normal breath sounds clear to auscultation       Cardiovascular Exercise Tolerance: Good hypertension, Pt. on medications and Pt. on home beta blockers + DOE   Rhythm:Regular Rate:Normal     Neuro/Psych negative neurological ROS  negative psych ROS   GI/Hepatic Neg liver ROS, GERD  Medicated,  Endo/Other  diabetes, Type 2, Oral Hypoglycemic AgentsMorbid obesity  Renal/GU negative Renal ROS  negative genitourinary   Musculoskeletal  (+) Arthritis , Osteoarthritis,    Abdominal   Peds  Hematology negative hematology ROS (+)   Anesthesia Other Findings   Reproductive/Obstetrics negative OB ROS                           Anesthesia Physical Anesthesia Plan  ASA: 3  Anesthesia Plan: General   Post-op Pain Management: Regional block and Tylenol PO (pre-op)   Induction: Intravenous  PONV Risk Score and Plan: 3 and Propofol infusion, Ondansetron and Dexamethasone  Airway Management Planned: LMA  Additional Equipment:   Intra-op Plan:   Post-operative Plan: Extubation in OR  Informed Consent: I have reviewed the patients History and Physical, chart, labs and discussed the procedure including the risks, benefits and alternatives for the proposed anesthesia with the patient or authorized representative who has indicated his/her understanding and acceptance.     Dental advisory given  Plan Discussed with: CRNA  Anesthesia Plan Comments: (See PAT note  05/09/2021, Jodell Cipro Ward, PA-C Pt had GA for her previous knee replacement.SAB was unsuccessful. She would prefer to have GA this time as well.)      Anesthesia Quick Evaluation

## 2021-05-15 NOTE — Care Plan (Signed)
Ortho Bundle Case Management Note  Patient Details  Name: [name] MRN: [id number] Date of Birth: 31-Jul-1945  Spoke with patient prior to surgery. She will have family to assist at home, in and out. Has rolling walker. HHPT referral to [facility] and OPPT set up with [facility]. Patient and MD in agreement with plan. Choice offered                     DME Arranged:    DME Agency:     HH Arranged:  PT HH Agency:  [facility]  Additional Comments: Please contact me with any questions of if this plan should need to change.  Shauna Hugh,  RN,BSN,MHA,CCM  [facility]opaedic Specialist  [phone] 05/15/2021, 9:04 AM

## 2021-05-16 DIAGNOSIS — M1712 Unilateral primary osteoarthritis, left knee: Secondary | ICD-10-CM | POA: Diagnosis present

## 2021-05-16 NOTE — H&P (Signed)
TOTAL KNEE ADMISSION H&P  Patient is being admitted for left total knee arthroplasty.  Subjective:  Chief Complaint:left knee pain.  HPI: [name], 75 y.o. female, has a history of pain and functional disability in the left knee due to arthritis and has failed non-surgical conservative treatments for greater than 12 weeks to includeNSAID's and/or analgesics, corticosteriod injections, flexibility and strengthening excercises, and activity modification.  Onset of symptoms was gradual, starting  several  years ago with gradually worsening course since that time. The patient noted no past surgery on the left knee(s).  Patient currently rates pain in the left knee(s) at 10 out of 10 with activity. Patient has night pain, worsening of pain with activity and weight bearing, pain that interferes with activities of daily living, pain with passive range of motion, crepitus, and joint swelling.  Patient has evidence of subchondral sclerosis and joint space narrowing by imaging studies.  There is no active infection.  Patient Active Problem List   Diagnosis Date Noted   S/P TKR (total knee replacement), right 04/19/2019   Osteoarthritis of right knee 04/16/2019   Obesity, diabetes, and hypertension syndrome ([place]) 06/08/2018   Bradycardia 06/08/2018   Chronic fatigue 06/08/2018   PVC (premature ventricular contraction) 08/03/2017   Costochondritis 08/02/2017   DOE (dyspnea on exertion) 08/01/2017   Chest pain 05/28/2016   Essential hypertension 05/28/2016   Diabetes mellitus type 2 in obese ([place]) 05/28/2016   UPPER RESPIRATORY INFECTION, VIRAL 03/22/2009   ALLERGIC RHINITIS CAUSE UNSPECIFIED 11/04/2008   Hyperlipidemia 04/11/2008   COUGH 04/11/2008   OBSTRUCTIVE SLEEP APNEA 03/21/2008   CARPAL TUNNEL RELEASE, BILATERAL, HX OF 03/21/2008   Past Medical History:  Diagnosis Date   Arthritis    hands   Bladder infection    Carpal tunnel syndrome    Complication of anesthesia    Difficult  to arouse   Diabetes mellitus    GERD (gastroesophageal reflux disease)    Hypertension    Sleep apnea     Past Surgical History:  Procedure Laterality Date   ABDOMINAL HYSTERECTOMY  1985   partial   APPENDECTOMY     age 66   CARPAL TUNNEL RELEASE Bilateral 1980   cataract surgery Right 07/07/2020   Dr. Katy Fitch   cataract surgery Left 12/2020   [place]     as a child   TOTAL KNEE ARTHROPLASTY Right 04/19/2019   Procedure: RIGHT TOTAL KNEE ARTHROPLASTY;  Surgeon: Frederik Pear, MD;  Location: [facility];  Service: Orthopedics;  Laterality: Right;    No current facility-administered medications for this encounter.   Current Outpatient Medications  Medication Sig Dispense Refill Last Dose   Ascorbic Acid (VITAMIN C) 1000 MG tablet Take 1,000 mg by mouth daily.      aspirin EC 81 MG tablet Take 1 tablet (81 mg total) by mouth 2 (two) times daily. (Patient taking differently: Take 81 mg by mouth daily.) 60 tablet 0    Bacillus Coagulans-Inulin (PROBIOTIC FORMULA PO) Take 1 capsule by mouth 2 (two) times a week.      carvedilol (COREG) 3.125 MG tablet carvedilol 3.125 mg tablet  Take 1 tablet twice a day by oral route. 180 tablet 1    cetirizine (ZYRTEC) 10 MG tablet Take 10 mg by mouth daily as needed for allergies.      Cholecalciferol (VITAMIN D) 50 MCG (2000 UT) tablet Take 2,000 Units by mouth daily.      clotrimazole-betamethasone (LOTRISONE) cream Apply topically  2 (two) times daily as needed. (Patient taking differently: Apply 1 application topically 2 (two) times daily as needed (eczema).) 30 g 1    Coenzyme Q10 (COQ10) 100 MG CAPS Take 100 mg by mouth daily with lunch.       estradiol (ESTRACE) 0.1 MG/GM vaginal cream Place 1 Applicatorful vaginally daily as needed.      famotidine (PEPCID) 20 MG tablet Take 1 tablet (20 mg total) by mouth 2 (two) times daily. 180 tablet 1    fluticasone (FLONASE) 50 MCG/ACT nasal spray Place 1 spray into both nostrils  daily as needed for allergies or rhinitis.      furosemide (LASIX) 20 MG tablet Take 1 tablet (20 mg total) by mouth daily as needed (fluid). 90 tablet 1    hydrochlorothiazide (MICROZIDE) 12.5 MG capsule Take 12.5 mg by mouth daily as needed (excess fluid/swelling).       loratadine (CLARITIN) 10 MG tablet Take 10 mg by mouth daily as needed for allergies.      losartan (COZAAR) 25 MG tablet Take 1 tablet (25 mg total) by mouth 2 (two) times a week. Take one tablet (25 mg) by mouth twice weekly - Tuesdays and Thursdays 36 tablet 3    metFORMIN (GLUCOPHAGE) 500 MG tablet TAKE 1 TABLET BY MOUTH TWICE DAILY WITH A MEAL 180 tablet 0    OXYGEN Inhale 1 L into the lungs at bedtime as needed (oxygen).      potassium chloride SA (KLOR-CON) 20 MEQ tablet Take 1 tablet (20 mEq total) by mouth every morning. 90 tablet 1    rosuvastatin (CRESTOR) 20 MG tablet Take 20 mg by mouth daily.      Vitamin D, Ergocalciferol, (DRISDOL) 1.25 MG (50000 UNIT) CAPS capsule Take one capsule twice weekly on Tues/Friday (Patient taking differently: Take 50,000 Units by mouth every Friday.) 24 capsule 0    OneTouch Delica Lancets 33G MISC USE   TO CHECK GLUCOSE BEFORE BREAKFAST AND  DINNER 100 each 0    rosuvastatin (CRESTOR) 40 MG tablet Take 1 tablet (40 mg total) by mouth daily. (Patient not taking: Reported on 05/04/2021) 90 tablet 3 Not Taking   Allergies  Allergen Reactions   Pravastatin Hives   Haemophilus Influenzae Vaccines     Nausea, pain, body weakness   Sulfa Antibiotics Itching   Sulfonamide Derivatives Itching   Tetanus Toxoids Swelling    Arm swelled at injection site    Social History   Tobacco Use   Smoking status: Never   Smokeless tobacco: Never  Substance Use Topics   Alcohol use: No    Family History  Problem Relation Age of Onset   Heart attack Father 26   Stroke Father 80   Diabetes Father    Diabetes Brother    Diabetes Brother    Breast cancer Sister 37   Diabetes Mother     Hypertension Mother      Review of Systems  Constitutional: Negative.   HENT:  Positive for congestion.   Eyes: Negative.   Respiratory: Negative.    Cardiovascular:        Htn and irregular heart beat  Gastrointestinal:        Poor appetite  Endocrine: Negative.   Genitourinary: Negative.   Musculoskeletal:  Positive for arthralgias and myalgias.  Skin: Negative.   Allergic/Immunologic: Negative.   Neurological: Negative.   Hematological: Negative.   Psychiatric/Behavioral: Negative.     Objective:  Physical Exam Constitutional:      Appearance:  Normal appearance. She is normal weight.  HENT:     Head: Normocephalic and atraumatic.     Nose: Nose normal.  Eyes:     Pupils: Pupils are equal, round, and reactive to light.  Cardiovascular:     Pulses: Normal pulses.  Pulmonary:     Effort: Pulmonary effort is normal.  Musculoskeletal:        General: Tenderness present.     Cervical back: Normal range of motion and neck supple.     Comments: the patient has a range from 0-110 on the right.  Patient's left knee has a range from 5 to 105.  Pain with extremes of flexion and extension.  Obvious crepitance with range of motion.  Discomfort with palpation over the medial joint line.  Calves are soft and nontender.  Skin:    General: Skin is warm and dry.  Neurological:     General: No focal deficit present.     Mental Status: She is alert and oriented to person, place, and time. Mental status is at baseline.  Psychiatric:        Mood and Affect: Mood normal.        Behavior: Behavior normal.        Thought Content: Thought content normal.        Judgment: Judgment normal.    Vital signs in last 24 hours:    Labs:   Estimated body mass index is 37.55 kg/m as calculated from the following:   Height as of 05/09/21: 5\' 3"  (1.6 m).   Weight as of 05/09/21: 96.2 kg.   Imaging Review Plain radiographs demonstrate  Patient's left knee has end-stage bone-on-bone  arthritis medial compartment and severe arthritis patellofemoral compartment.  She has started to erode the tibial plateau medially.      Assessment/Plan:  End stage arthritis, left knee   The patient history, physical examination, clinical judgment of the provider and imaging studies are consistent with end stage degenerative joint disease of the left knee(s) and total knee arthroplasty is deemed medically necessary. The treatment options including medical management, injection therapy arthroscopy and arthroplasty were discussed at length. The risks and benefits of total knee arthroplasty were presented and reviewed. The risks due to aseptic loosening, infection, stiffness, patella tracking problems, thromboembolic complications and other imponderables were discussed. The patient acknowledged the explanation, agreed to proceed with the plan and consent was signed. Patient is being admitted for inpatient treatment for surgery, pain control, PT, OT, prophylactic antibiotics, VTE prophylaxis, progressive ambulation and ADL's and discharge planning. The patient is planning to be discharged home with home health services     Patient's anticipated LOS is less than 2 midnights, meeting these requirements: - Younger than 66 - Lives within 1 hour of care - Has a competent adult at home to recover with post-op recover - NO history of  - Chronic pain requiring opiods  - Diabetes  - Coronary Artery Disease  - Heart failure  - Heart attack  - Stroke  - DVT/VTE  - Cardiac arrhythmia  - Respiratory Failure/COPD  - Renal failure  - Anemia  - Advanced Liver disease

## 2021-05-21 ENCOUNTER — Encounter (HOSPITAL_COMMUNITY): Payer: Self-pay | Admitting: Orthopedic Surgery

## 2021-05-21 ENCOUNTER — Observation Stay (HOSPITAL_COMMUNITY)
Admission: RE | Admit: 2021-05-21 | Discharge: 2021-05-24 | Disposition: A | Discharge status: Home / Self Care | Payer: Medicare HMO | Attending: Orthopedic Surgery | Admitting: Orthopedic Surgery

## 2021-05-21 ENCOUNTER — Other Ambulatory Visit: Payer: Self-pay

## 2021-05-21 ENCOUNTER — Encounter (HOSPITAL_COMMUNITY)
Admission: RE | Disposition: A | Discharge status: Home / Self Care | Payer: Self-pay | Source: Home / Self Care | Attending: Orthopedic Surgery

## 2021-05-21 ENCOUNTER — Ambulatory Visit (HOSPITAL_COMMUNITY): Payer: Medicare HMO | Admitting: Anesthesiology

## 2021-05-21 ENCOUNTER — Ambulatory Visit (HOSPITAL_COMMUNITY): Payer: Medicare HMO | Admitting: Physician Assistant

## 2021-05-21 DIAGNOSIS — Z96651 Presence of right artificial knee joint: Secondary | ICD-10-CM | POA: Diagnosis not present

## 2021-05-21 DIAGNOSIS — Z7984 Long term (current) use of oral hypoglycemic drugs: Secondary | ICD-10-CM | POA: Diagnosis not present

## 2021-05-21 DIAGNOSIS — I10 Essential (primary) hypertension: Secondary | ICD-10-CM | POA: Insufficient documentation

## 2021-05-21 DIAGNOSIS — Z01818 Encounter for other preprocedural examination: Secondary | ICD-10-CM

## 2021-05-21 DIAGNOSIS — Z794 Long term (current) use of insulin: Secondary | ICD-10-CM | POA: Insufficient documentation

## 2021-05-21 DIAGNOSIS — M1712 Unilateral primary osteoarthritis, left knee: Principal | ICD-10-CM | POA: Diagnosis present

## 2021-05-21 DIAGNOSIS — E669 Obesity, unspecified: Secondary | ICD-10-CM | POA: Diagnosis not present

## 2021-05-21 DIAGNOSIS — Z20822 Contact with and (suspected) exposure to covid-19: Secondary | ICD-10-CM | POA: Diagnosis not present

## 2021-05-21 DIAGNOSIS — Z96652 Presence of left artificial knee joint: Secondary | ICD-10-CM

## 2021-05-21 DIAGNOSIS — G8918 Other acute postprocedural pain: Secondary | ICD-10-CM | POA: Diagnosis not present

## 2021-05-21 DIAGNOSIS — E1169 Type 2 diabetes mellitus with other specified complication: Secondary | ICD-10-CM | POA: Diagnosis not present

## 2021-05-21 DIAGNOSIS — Z79899 Other long term (current) drug therapy: Secondary | ICD-10-CM | POA: Diagnosis not present

## 2021-05-21 DIAGNOSIS — G4733 Obstructive sleep apnea (adult) (pediatric): Secondary | ICD-10-CM | POA: Diagnosis not present

## 2021-05-21 HISTORY — PX: TOTAL KNEE ARTHROPLASTY: SHX125

## 2021-05-21 LAB — GLUCOSE, CAPILLARY
Glucose-Capillary: 114 mg/dL — ABNORMAL HIGH (ref 70–99)
Glucose-Capillary: 125 mg/dL — ABNORMAL HIGH (ref 70–99)
Glucose-Capillary: 169 mg/dL — ABNORMAL HIGH (ref 70–99)
Glucose-Capillary: 164 mg/dL — ABNORMAL HIGH (ref 70–99)
Glucose-Capillary: 175 mg/dL — ABNORMAL HIGH (ref 70–99)

## 2021-05-21 LAB — HEMOGLOBIN A1C
Hgb A1c MFr Bld: 6.5 % — ABNORMAL HIGH (ref 4.8–5.6)
Mean Plasma Glucose: 140 mg/dL

## 2021-05-21 LAB — SARS CORONAVIRUS 2 BY RT PCR (HOSPITAL ORDER, PERFORMED IN ~~LOC~~ HOSPITAL LAB): SARS Coronavirus 2: NEGATIVE

## 2021-05-21 SURGERY — ARTHROPLASTY, KNEE, TOTAL
Anesthesia: Regional | Site: Knee | Laterality: Left

## 2021-05-21 MED ORDER — POVIDONE-IODINE 10 % EX SWAB
2.0000 "application " | Freq: Once | CUTANEOUS | Status: AC
  Administered 2021-05-21: 2 via TOPICAL

## 2021-05-21 MED ORDER — ESTRADIOL 0.1 MG/GM VA CREA
1.0000 | TOPICAL_CREAM | Freq: Every day | VAGINAL | Status: DC | PRN
  Filled 2021-05-21: qty 42.5

## 2021-05-21 MED ORDER — ONDANSETRON HCL 4 MG/2ML IJ SOLN
INTRAMUSCULAR | Status: AC
  Filled 2021-05-21: qty 2

## 2021-05-21 MED ORDER — TIZANIDINE HCL 2 MG PO TABS: 2.0000 mg | ORAL_TABLET | Freq: Four times a day (QID) | ORAL | 0 refills | Status: DC | PRN

## 2021-05-21 MED ORDER — ONDANSETRON HCL 4 MG PO TABS
4.0000 mg | ORAL_TABLET | Freq: Four times a day (QID) | ORAL | Status: DC | PRN
  Administered 2021-05-22: 4 mg via ORAL
  Filled 2021-05-21: qty 1

## 2021-05-21 MED ORDER — LORATADINE 10 MG PO TABS: 10.0000 mg | ORAL_TABLET | Freq: Every day | ORAL | Status: DC

## 2021-05-21 MED ORDER — FENTANYL CITRATE (PF) 100 MCG/2ML IJ SOLN
INTRAMUSCULAR | Status: AC
  Filled 2021-05-21: qty 2

## 2021-05-21 MED ORDER — TRANEXAMIC ACID 1000 MG/10ML IV SOLN
INTRAVENOUS | Status: DC | PRN
  Administered 2021-05-21: 09:00:00 2000 mg via TOPICAL

## 2021-05-21 MED ORDER — EPHEDRINE 5 MG/ML INJ
INTRAVENOUS | Status: AC
  Filled 2021-05-21: qty 5

## 2021-05-21 MED ORDER — HYDROMORPHONE HCL 1 MG/ML IJ SOLN
INTRAMUSCULAR | Status: DC | PRN
  Administered 2021-05-21 (×2): 1 mg via INTRAVENOUS

## 2021-05-21 MED ORDER — LIDOCAINE HCL (PF) 2 % IJ SOLN
INTRAMUSCULAR | Status: DC | PRN
  Administered 2021-05-21: 60 mg via INTRADERMAL

## 2021-05-21 MED ORDER — BUPIVACAINE-EPINEPHRINE (PF) 0.25% -1:200000 IJ SOLN
INTRAMUSCULAR | Status: AC
  Filled 2021-05-21: qty 30

## 2021-05-21 MED ORDER — PANTOPRAZOLE SODIUM 40 MG PO TBEC
40.0000 mg | DELAYED_RELEASE_TABLET | Freq: Every day | ORAL | Status: DC
  Administered 2021-05-23 – 2021-05-24 (×2): 40 mg via ORAL
  Filled 2021-05-21 (×2): qty 1

## 2021-05-21 MED ORDER — METOCLOPRAMIDE HCL 5 MG/ML IJ SOLN: 5.0000 mg | Freq: Three times a day (TID) | INTRAMUSCULAR | Status: DC | PRN

## 2021-05-21 MED ORDER — HYDROCHLOROTHIAZIDE 12.5 MG PO CAPS
12.5000 mg | ORAL_CAPSULE | Freq: Every day | ORAL | Status: DC | PRN
  Filled 2021-05-21: qty 1

## 2021-05-21 MED ORDER — PROPOFOL 500 MG/50ML IV EMUL
INTRAVENOUS | Status: DC | PRN
  Administered 2021-05-21: 125 ug/kg/min via INTRAVENOUS

## 2021-05-21 MED ORDER — VITAMIN D3 25 MCG (1000 UNIT) PO TABS
2000.0000 [IU] | ORAL_TABLET | Freq: Every day | ORAL | Status: DC
  Administered 2021-05-21 – 2021-05-24 (×4): 2000 [IU] via ORAL
  Filled 2021-05-21 (×8): qty 2

## 2021-05-21 MED ORDER — DEXAMETHASONE SODIUM PHOSPHATE 10 MG/ML IJ SOLN
10.0000 mg | Freq: Once | INTRAMUSCULAR | Status: AC
  Administered 2021-05-22: 10 mg via INTRAVENOUS
  Filled 2021-05-21: qty 1

## 2021-05-21 MED ORDER — LACTATED RINGERS IV SOLN: INTRAVENOUS | Status: DC

## 2021-05-21 MED ORDER — SODIUM CHLORIDE (PF) 0.9 % IJ SOLN
INTRAMUSCULAR | Status: DC | PRN
  Administered 2021-05-21: 50 mL

## 2021-05-21 MED ORDER — PROPOFOL 10 MG/ML IV BOLUS
INTRAVENOUS | Status: DC | PRN
  Administered 2021-05-21: 100 mg via INTRAVENOUS

## 2021-05-21 MED ORDER — ONDANSETRON HCL 4 MG/2ML IJ SOLN
INTRAMUSCULAR | Status: DC | PRN
  Administered 2021-05-21: 4 mg via INTRAVENOUS

## 2021-05-21 MED ORDER — WATER FOR IRRIGATION, STERILE IR SOLN
Status: DC | PRN
  Administered 2021-05-21: 2000 mL

## 2021-05-21 MED ORDER — COQ10 100 MG PO CAPS: 100.0000 mg | ORAL_CAPSULE | Freq: Every day | ORAL | Status: DC

## 2021-05-21 MED ORDER — TRANEXAMIC ACID-NACL 1000-0.7 MG/100ML-% IV SOLN
1000.0000 mg | INTRAVENOUS | Status: AC
  Administered 2021-05-21: 08:00:00 1000 mg via INTRAVENOUS
  Filled 2021-05-21: qty 100

## 2021-05-21 MED ORDER — FUROSEMIDE 20 MG PO TABS: 20.0000 mg | ORAL_TABLET | Freq: Every day | ORAL | Status: DC | PRN

## 2021-05-21 MED ORDER — ALUM & MAG HYDROXIDE-SIMETH 200-200-20 MG/5ML PO SUSP: 30.0000 mL | ORAL | Status: DC | PRN

## 2021-05-21 MED ORDER — METFORMIN HCL 500 MG PO TABS
500.0000 mg | ORAL_TABLET | Freq: Two times a day (BID) | ORAL | Status: DC
  Administered 2021-05-21 – 2021-05-24 (×7): 500 mg via ORAL
  Filled 2021-05-21 (×7): qty 1

## 2021-05-21 MED ORDER — METHOCARBAMOL 500 MG PO TABS
500.0000 mg | ORAL_TABLET | Freq: Four times a day (QID) | ORAL | Status: DC | PRN
  Administered 2021-05-21 – 2021-05-24 (×7): 500 mg via ORAL
  Filled 2021-05-21 (×7): qty 1

## 2021-05-21 MED ORDER — HYDROMORPHONE HCL 1 MG/ML IJ SOLN
0.5000 mg | INTRAMUSCULAR | Status: DC | PRN
  Administered 2021-05-21 – 2021-05-22 (×2): 1 mg via INTRAVENOUS
  Filled 2021-05-21 (×2): qty 1

## 2021-05-21 MED ORDER — TRANEXAMIC ACID 1000 MG/10ML IV SOLN
2000.0000 mg | INTRAVENOUS | Status: DC
  Filled 2021-05-21: qty 20

## 2021-05-21 MED ORDER — METHOCARBAMOL 1000 MG/10ML IJ SOLN
500.0000 mg | Freq: Four times a day (QID) | INTRAVENOUS | Status: DC | PRN
  Filled 2021-05-21: qty 5

## 2021-05-21 MED ORDER — ROSUVASTATIN CALCIUM 20 MG PO TABS
20.0000 mg | ORAL_TABLET | Freq: Every day | ORAL | Status: DC
  Administered 2021-05-21 – 2021-05-24 (×4): 20 mg via ORAL
  Filled 2021-05-21 (×4): qty 1

## 2021-05-21 MED ORDER — BUPIVACAINE-EPINEPHRINE (PF) 0.25% -1:200000 IJ SOLN
INTRAMUSCULAR | Status: DC | PRN
  Administered 2021-05-21: 30 mL

## 2021-05-21 MED ORDER — ASPIRIN EC 81 MG PO TBEC: 81.0000 mg | DELAYED_RELEASE_TABLET | Freq: Two times a day (BID) | ORAL | 0 refills | Status: AC

## 2021-05-21 MED ORDER — BISACODYL 5 MG PO TBEC: 5.0000 mg | DELAYED_RELEASE_TABLET | Freq: Every day | ORAL | Status: DC | PRN

## 2021-05-21 MED ORDER — OXYCODONE-ACETAMINOPHEN 5-325 MG PO TABS: 1.0000 | ORAL_TABLET | ORAL | 0 refills | Status: DC | PRN

## 2021-05-21 MED ORDER — TRANEXAMIC ACID-NACL 1000-0.7 MG/100ML-% IV SOLN
1000.0000 mg | Freq: Once | INTRAVENOUS | Status: AC
  Administered 2021-05-21: 14:00:00 1000 mg via INTRAVENOUS
  Filled 2021-05-21: qty 100

## 2021-05-21 MED ORDER — DEXAMETHASONE SODIUM PHOSPHATE 10 MG/ML IJ SOLN
INTRAMUSCULAR | Status: AC
  Filled 2021-05-21: qty 1

## 2021-05-21 MED ORDER — METOCLOPRAMIDE HCL 5 MG PO TABS
5.0000 mg | ORAL_TABLET | Freq: Three times a day (TID) | ORAL | Status: DC | PRN
  Administered 2021-05-22: 10 mg via ORAL
  Filled 2021-05-21: qty 2

## 2021-05-21 MED ORDER — ACETAMINOPHEN 500 MG PO TABS
1000.0000 mg | ORAL_TABLET | Freq: Once | ORAL | Status: AC
  Administered 2021-05-21: 06:00:00 1000 mg via ORAL
  Filled 2021-05-21: qty 2

## 2021-05-21 MED ORDER — PROPOFOL 1000 MG/100ML IV EMUL
INTRAVENOUS | Status: AC
  Filled 2021-05-21: qty 100

## 2021-05-21 MED ORDER — LOSARTAN POTASSIUM 25 MG PO TABS
25.0000 mg | ORAL_TABLET | ORAL | Status: DC
  Administered 2021-05-24: 25 mg via ORAL
  Filled 2021-05-21: qty 1

## 2021-05-21 MED ORDER — CEFAZOLIN SODIUM-DEXTROSE 2-4 GM/100ML-% IV SOLN
2.0000 g | INTRAVENOUS | Status: AC
  Administered 2021-05-21: 08:00:00 2 g via INTRAVENOUS
  Filled 2021-05-21: qty 100

## 2021-05-21 MED ORDER — KCL IN DEXTROSE-NACL 20-5-0.45 MEQ/L-%-% IV SOLN
INTRAVENOUS | Status: DC
  Filled 2021-05-21 (×4): qty 1000

## 2021-05-21 MED ORDER — HYDROMORPHONE HCL 1 MG/ML IJ SOLN: 0.2500 mg | INTRAMUSCULAR | Status: DC | PRN

## 2021-05-21 MED ORDER — SODIUM CHLORIDE 0.9 % IR SOLN
Status: DC | PRN
  Administered 2021-05-21: 1000 mL
  Administered 2021-05-21: 2000 mL

## 2021-05-21 MED ORDER — ASPIRIN 81 MG PO CHEW
81.0000 mg | CHEWABLE_TABLET | Freq: Two times a day (BID) | ORAL | Status: DC
  Administered 2021-05-21 – 2021-05-24 (×6): 81 mg via ORAL
  Filled 2021-05-21 (×6): qty 1

## 2021-05-21 MED ORDER — DIPHENHYDRAMINE HCL 12.5 MG/5ML PO ELIX: 12.5000 mg | ORAL_SOLUTION | ORAL | Status: DC | PRN

## 2021-05-21 MED ORDER — CLOTRIMAZOLE 1 % EX CREA
TOPICAL_CREAM | Freq: Two times a day (BID) | CUTANEOUS | Status: DC
  Administered 2021-05-22: 1 via TOPICAL
  Filled 2021-05-21: qty 15

## 2021-05-21 MED ORDER — LORATADINE 10 MG PO TABS: 10.0000 mg | ORAL_TABLET | Freq: Every day | ORAL | Status: DC | PRN

## 2021-05-21 MED ORDER — FLEET ENEMA 7-19 GM/118ML RE ENEM: 1.0000 | ENEMA | Freq: Once | RECTAL | Status: DC | PRN

## 2021-05-21 MED ORDER — HYDROMORPHONE HCL 2 MG/ML IJ SOLN
INTRAMUSCULAR | Status: AC
  Filled 2021-05-21: qty 1

## 2021-05-21 MED ORDER — ASCORBIC ACID 500 MG PO TABS
1000.0000 mg | ORAL_TABLET | Freq: Every day | ORAL | Status: DC
  Administered 2021-05-21 – 2021-05-24 (×4): 1000 mg via ORAL
  Filled 2021-05-21 (×4): qty 2

## 2021-05-21 MED ORDER — ACETAMINOPHEN 325 MG PO TABS: 325.0000 mg | ORAL_TABLET | Freq: Four times a day (QID) | ORAL | Status: DC | PRN

## 2021-05-21 MED ORDER — POLYETHYLENE GLYCOL 3350 17 G PO PACK: 17.0000 g | PACK | Freq: Every day | ORAL | Status: DC | PRN

## 2021-05-21 MED ORDER — POTASSIUM CHLORIDE CRYS ER 20 MEQ PO TBCR
20.0000 meq | EXTENDED_RELEASE_TABLET | Freq: Every morning | ORAL | Status: DC
  Administered 2021-05-21 – 2021-05-24 (×4): 20 meq via ORAL
  Filled 2021-05-21 (×4): qty 1

## 2021-05-21 MED ORDER — ORAL CARE MOUTH RINSE: 15.0000 mL | Freq: Once | OROMUCOSAL | Status: AC

## 2021-05-21 MED ORDER — MENTHOL 3 MG MT LOZG: 1.0000 | LOZENGE | OROMUCOSAL | Status: DC | PRN

## 2021-05-21 MED ORDER — BUPIVACAINE LIPOSOME 1.3 % IJ SUSP
INTRAMUSCULAR | Status: DC | PRN
  Administered 2021-05-21: 20 mL

## 2021-05-21 MED ORDER — DEXAMETHASONE SODIUM PHOSPHATE 10 MG/ML IJ SOLN
INTRAMUSCULAR | Status: DC | PRN
  Administered 2021-05-21: 10 mg via INTRAVENOUS

## 2021-05-21 MED ORDER — FAMOTIDINE 20 MG PO TABS
20.0000 mg | ORAL_TABLET | Freq: Two times a day (BID) | ORAL | Status: DC
  Administered 2021-05-21 – 2021-05-24 (×7): 20 mg via ORAL
  Filled 2021-05-21 (×7): qty 1

## 2021-05-21 MED ORDER — FENTANYL CITRATE (PF) 100 MCG/2ML IJ SOLN
INTRAMUSCULAR | Status: DC | PRN
  Administered 2021-05-21 (×2): 50 ug via INTRAVENOUS

## 2021-05-21 MED ORDER — CARVEDILOL 3.125 MG PO TABS
3.1250 mg | ORAL_TABLET | Freq: Two times a day (BID) | ORAL | Status: DC
  Administered 2021-05-21 – 2021-05-24 (×7): 3.125 mg via ORAL
  Filled 2021-05-21 (×7): qty 1

## 2021-05-21 MED ORDER — BUPIVACAINE-EPINEPHRINE (PF) 0.5% -1:200000 IJ SOLN
INTRAMUSCULAR | Status: DC | PRN
  Administered 2021-05-21: 20 mL via PERINEURAL

## 2021-05-21 MED ORDER — ONDANSETRON HCL 4 MG/2ML IJ SOLN
4.0000 mg | Freq: Four times a day (QID) | INTRAMUSCULAR | Status: DC | PRN
  Administered 2021-05-21: 17:00:00 4 mg via INTRAVENOUS
  Filled 2021-05-21: qty 2

## 2021-05-21 MED ORDER — BUPIVACAINE LIPOSOME 1.3 % IJ SUSP
INTRAMUSCULAR | Status: AC
  Filled 2021-05-21: qty 20

## 2021-05-21 MED ORDER — BUPIVACAINE LIPOSOME 1.3 % IJ SUSP: 20.0000 mL | Freq: Once | INTRAMUSCULAR | Status: DC

## 2021-05-21 MED ORDER — FLUTICASONE PROPIONATE 50 MCG/ACT NA SUSP
1.0000 | Freq: Every day | NASAL | Status: DC | PRN
  Filled 2021-05-21: qty 16

## 2021-05-21 MED ORDER — OXYCODONE HCL 5 MG PO TABS
5.0000 mg | ORAL_TABLET | ORAL | Status: DC | PRN
  Administered 2021-05-21 – 2021-05-24 (×9): 10 mg via ORAL
  Filled 2021-05-21 (×9): qty 2

## 2021-05-21 MED ORDER — MIDAZOLAM HCL 2 MG/2ML IJ SOLN
INTRAMUSCULAR | Status: AC
  Filled 2021-05-21: qty 2

## 2021-05-21 MED ORDER — PHENOL 1.4 % MT LIQD: 1.0000 | OROMUCOSAL | Status: DC | PRN

## 2021-05-21 MED ORDER — INSULIN ASPART 100 UNIT/ML IJ SOLN
0.0000 [IU] | Freq: Three times a day (TID) | INTRAMUSCULAR | Status: DC
  Administered 2021-05-21 – 2021-05-22 (×3): 3 [IU] via SUBCUTANEOUS
  Administered 2021-05-23: 2 [IU] via SUBCUTANEOUS

## 2021-05-21 MED ORDER — EPHEDRINE SULFATE-NACL 50-0.9 MG/10ML-% IV SOSY
PREFILLED_SYRINGE | INTRAVENOUS | Status: DC | PRN
  Administered 2021-05-21: 5 mg via INTRAVENOUS
  Administered 2021-05-21: 10 mg via INTRAVENOUS

## 2021-05-21 MED ORDER — CHLORHEXIDINE GLUCONATE 0.12 % MT SOLN
15.0000 mL | Freq: Once | OROMUCOSAL | Status: AC
  Administered 2021-05-21: 06:00:00 15 mL via OROMUCOSAL

## 2021-05-21 MED ORDER — DOCUSATE SODIUM 100 MG PO CAPS
100.0000 mg | ORAL_CAPSULE | Freq: Two times a day (BID) | ORAL | Status: DC
  Administered 2021-05-21 – 2021-05-24 (×7): 100 mg via ORAL
  Filled 2021-05-21 (×7): qty 1

## 2021-05-21 SURGICAL SUPPLY — 47 items
ATTUNE PS FEM LT SZ 6 CEM KNEE (Femur) ×2 IMPLANT
ATTUNE PSRP INSR SZ6 5 KNEE (Insert) ×2 IMPLANT
BAG COUNTER SPONGE SURGICOUNT (BAG) ×2 IMPLANT
BAG DECANTER FOR FLEXI CONT (MISCELLANEOUS) ×4 IMPLANT
BAG ZIPLOCK 12X15 (MISCELLANEOUS) ×2 IMPLANT
BASE TIBIA ATTUNE KNEE SYS SZ6 (Knees) ×1 IMPLANT
BLADE SAG 18X100X1.27 (BLADE) ×2 IMPLANT
BLADE SAW SGTL 11.0X1.19X90.0M (BLADE) ×2 IMPLANT
BLADE SURG SZ10 CARB STEEL (BLADE) ×4 IMPLANT
BNDG ELASTIC 6X10 VLCR STRL LF (GAUZE/BANDAGES/DRESSINGS) ×2 IMPLANT
BOWL SMART MIX CTS (DISPOSABLE) ×2 IMPLANT
CEMENT HV SMART SET (Cement) ×4 IMPLANT
COVER SURGICAL LIGHT HANDLE (MISCELLANEOUS) ×2 IMPLANT
CUFF TOURN SGL QUICK 34 (TOURNIQUET CUFF)
CUFF TRNQT CYL 34X4.125X (TOURNIQUET CUFF) IMPLANT
DECANTER SPIKE VIAL GLASS SM (MISCELLANEOUS) IMPLANT
DRAPE INCISE IOBAN 66X45 STRL (DRAPES) IMPLANT
DRAPE U-SHAPE 47X51 STRL (DRAPES) ×2 IMPLANT
DRSG AQUACEL AG ADV 3.5X10 (GAUZE/BANDAGES/DRESSINGS) ×2 IMPLANT
DURAPREP 26ML APPLICATOR (WOUND CARE) ×2 IMPLANT
ELECT REM PT RETURN 15FT ADLT (MISCELLANEOUS) ×2 IMPLANT
GLOVE SRG 8 PF TXTR STRL LF DI (GLOVE) ×1 IMPLANT
GLOVE SURG ENC MOIS LTX SZ7.5 (GLOVE) ×2 IMPLANT
GLOVE SURG ENC MOIS LTX SZ8.5 (GLOVE) ×2 IMPLANT
GLOVE SURG UNDER POLY LF SZ8 (GLOVE) ×2
GLOVE SURG UNDER POLY LF SZ9 (GLOVE) ×2 IMPLANT
GOWN STRL REUS W/TWL XL LVL3 (GOWN DISPOSABLE) ×4 IMPLANT
HANDPIECE INTERPULSE COAX TIP (DISPOSABLE) ×2
HOOD PEEL AWAY FLYTE STAYCOOL (MISCELLANEOUS) ×6 IMPLANT
KIT TURNOVER KIT A (KITS) IMPLANT
NEEDLE HYPO 21X1.5 SAFETY (NEEDLE) ×4 IMPLANT
NS IRRIG 1000ML POUR BTL (IV SOLUTION) ×2 IMPLANT
PACK TOTAL KNEE CUSTOM (KITS) ×2 IMPLANT
PATELLA MEDIAL ATTUN 35MM KNEE (Knees) ×2 IMPLANT
PROTECTOR NERVE ULNAR (MISCELLANEOUS) ×2 IMPLANT
SET HNDPC FAN SPRY TIP SCT (DISPOSABLE) ×1 IMPLANT
SPONGE T-LAP 18X18 ~~LOC~~+RFID (SPONGE) ×6 IMPLANT
SUT VIC AB 1 CTX 36 (SUTURE) ×2
SUT VIC AB 1 CTX36XBRD ANBCTR (SUTURE) ×1 IMPLANT
SUT VIC AB 3-0 CT1 27 (SUTURE) ×6
SUT VIC AB 3-0 CT1 TAPERPNT 27 (SUTURE) ×3 IMPLANT
SYR CONTROL 10ML LL (SYRINGE) ×4 IMPLANT
TIBIA ATTUNE KNEE SYS BASE SZ6 (Knees) ×2 IMPLANT
TRAY FOLEY MTR SLVR 16FR STAT (SET/KITS/TRAYS/PACK) IMPLANT
TUBE SUCTION HIGH CAP CLEAR NV (SUCTIONS) ×2 IMPLANT
WATER STERILE IRR 1000ML POUR (IV SOLUTION) ×4 IMPLANT
WRAP KNEE MAXI GEL POST OP (GAUZE/BANDAGES/DRESSINGS) ×2 IMPLANT

## 2021-05-21 NOTE — Transfer of Care (Signed)
Immediate Anesthesia Transfer of Care Note  Patient: [name]  Procedure(s) Performed: LEFT TOTAL KNEE ARTHROPLASTY (Left: Knee)  Patient Location: PACU  Anesthesia Type:GA combined with regional for post-op pain  Level of Consciousness: awake  Airway & Oxygen Therapy: Patient Spontanous Breathing and Patient connected to face mask oxygen  Post-op Assessment: Report given to RN and Post -op Vital signs reviewed and stable  Post vital signs: Reviewed and stable  Last Vitals:  Vitals Value Taken Time  BP 128/77 05/21/21 0930  Temp    Pulse 61 05/21/21 0932  Resp 2 05/21/21 0932  SpO2 100 % 05/21/21 0932  Vitals shown include unvalidated device data.  Last Pain:  Vitals:   05/21/21 0641  TempSrc: Oral  PainSc:          Complications: No notable events documented.

## 2021-05-21 NOTE — Progress Notes (Signed)
Orthopedic Tech Progress Note Patient Details:  [name] 03/27/1946 [id number]  Ortho Devices Type of Ortho Device: Bone foam zero knee Ortho Device/Splint Interventions: Application   Post Interventions Patient Tolerated: Well Instructions Provided: Care of device  Saul Fordyce 05/21/2021, 10:36 AM

## 2021-05-21 NOTE — Discharge Instructions (Signed)

## 2021-05-21 NOTE — Interval H&P Note (Signed)
History and Physical Interval Note:  05/21/2021 7:12 AM  [name]  has presented today for surgery, with the diagnosis of LEFT KNEE OSTEOARTHRITIS.  The various methods of treatment have been discussed with the patient and family. After consideration of risks, benefits and other options for treatment, the patient has consented to  Procedure(s): LEFT TOTAL KNEE ARTHROPLASTY (Left) as a surgical intervention.  The patient's history has been reviewed, patient examined, no change in status, stable for surgery.  I have reviewed the patient's chart and labs.  Questions were answered to the patient's satisfaction.     Nestor Lewandowsky

## 2021-05-21 NOTE — Anesthesia Postprocedure Evaluation (Signed)
Anesthesia Post Note  Patient: [name]  Procedure(s) Performed: LEFT TOTAL KNEE ARTHROPLASTY (Left: Knee)     Patient location during evaluation: PACU Anesthesia Type: Regional and General Level of consciousness: awake and alert Pain management: pain level controlled Vital Signs Assessment: post-procedure vital signs reviewed and stable Respiratory status: spontaneous breathing, nonlabored ventilation, respiratory function stable and patient connected to nasal cannula oxygen Cardiovascular status: blood pressure returned to baseline and stable Postop Assessment: no apparent nausea or vomiting Anesthetic complications: no   No notable events documented.  Last Vitals:  Vitals:   05/21/21 1015 05/21/21 1044  BP: 122/70 133/75  Pulse: (!) 44 (!) 45  Resp: 12 16  Temp:  (!) 36.4 C  SpO2: 99% 99%    Last Pain:  Vitals:   05/21/21 1015  TempSrc:   PainSc: Asleep                 Zared Knoth,W. EDMOND

## 2021-05-21 NOTE — Anesthesia Procedure Notes (Signed)
Procedure Name: LMA Insertion Date/Time: 05/21/2021 7:31 AM Performed by: [place], Braden Deloach C, CRNA Pre-anesthesia Checklist: Patient identified, Emergency Drugs available, Suction available and Patient being monitored Patient Re-evaluated:Patient Re-evaluated prior to induction Oxygen Delivery Method: Circle system utilized Preoxygenation: Pre-oxygenation with 100% oxygen Induction Type: IV induction Ventilation: Mask ventilation without difficulty LMA: LMA inserted LMA Size: 4.0 Number of attempts: 1 Airway Equipment and Method: Bite block Placement Confirmation: positive ETCO2 Tube secured with: Tape Dental Injury: Teeth and Oropharynx as per pre-operative assessment

## 2021-05-21 NOTE — Anesthesia Procedure Notes (Signed)
Anesthesia Regional Block: Adductor canal block   Pre-Anesthetic Checklist: , timeout performed,  Correct Patient, Correct Site, Correct Laterality,  Correct Procedure, Correct Position, site marked,  Risks and benefits discussed,  Pre-op evaluation,  At surgeon's request and post-op pain management  Laterality: Left  Prep: Maximum Sterile Barrier Precautions used, chloraprep       Needles:  Injection technique: Single-shot  Needle Type: Echogenic Stimulator Needle     Needle Length: 9cm  Needle Gauge: 21     Additional Needles:   Procedures:,,,, ultrasound used (permanent image in chart),,    Narrative:  Start time: 05/21/2021 6:48 AM End time: 05/21/2021 6:58 AM Injection made incrementally with aspirations every 5 mL.  Performed by: Personally  Anesthesiologist: Gaynelle Adu, MD

## 2021-05-21 NOTE — Op Note (Signed)
PATIENT ID:      [name]  MRN:     [id number] DOB/AGE:    Oct 24, 1945 / 75 y.o.       OPERATIVE REPORT   DATE OF PROCEDURE:  05/21/2021      PREOPERATIVE DIAGNOSIS:   LEFT KNEE OSTEOARTHRITIS      Estimated body mass index is 37.55 kg/m as calculated from the following:   Height as of 05/09/21: 5\' 3"  (1.6 m).   Weight as of this encounter: 96.2 kg.                                                       POSTOPERATIVE DIAGNOSIS:   Same                                                                  PROCEDURE:  Procedure(s): LEFT TOTAL KNEE ARTHROPLASTY Using DepuyAttune RP implants #6L Femur, #6Tibia, 5 mm Attune RP bearing, 35 Patella    SURGEON:  ASSISTANT:   Nestor Lewandowsky. Tomi Likens   (Present and scrubbed throughout the case, critical for assistance with exposure, retraction, instrumentation, and closure.)        ANESTHESIA: spinal, 20cc Exparel, 50cc 0.25% Marcaine EBL: 300 cc FLUID REPLACEMENT: 1500 cc crystaloid TOURNIQUET: DRAINS: None TRANEXAMIC ACID: 1gm IV, 2gm topical COMPLICATIONS:  None         INDICATIONS FOR PROCEDURE: The patient has  LEFT KNEE OSTEOARTHRITIS, Var deformities, XR shows bone on bone arthritis, lateral subluxation of tibia. Patient has failed all conservative measures including anti-inflammatory medicines, narcotics, attempts at exercise and weight loss, cortisone injections and viscosupplementation.  Risks and benefits of surgery have been discussed, questions answered.   DESCRIPTION OF PROCEDURE: The patient identified by armband, received  IV antibiotics, in the holding area at [facility]. Patient taken to the operating room, appropriate anesthetic monitors were attached, and spinal anesthesia was  induced. IV Tranexamic acid was given.Tourniquet applied high to the operative thigh. Lateral post and foot positioner applied to the table, the lower extremity was then prepped and draped in usual sterile fashion from the toes to  the tourniquet. Time-out procedure was performed. [facility]. [facility] PAC, was present and scrubbed throughout the case, critical for assistance with, positioning, exposure, retraction, instrumentation, and closure.The skin and subcutaneous tissue along the incision was injected with 20 cc of a mixture of Exparel and Marcaine solution, using a 20-gauge by 1-1/2 inch needle. We began the operation, with the knee flexed 130 degrees, by making the anterior midline incision starting at handbreadth above the patella going over the patella 1 cm medial to and 4 cm distal to the tibial tubercle. Small bleeders in the skin and the subcutaneous tissue identified and cauterized. Transverse retinaculum was incised and reflected medially and a medial parapatellar arthrotomy was accomplished. the patella was everted and theprepatellar fat pad resected. The superficial medial collateral ligament was then elevated from anterior to posterior along the proximal flare of the tibia and anterior half of the menisci resected. The knee was hyperflexed exposing bone on bone arthritis. Peripheral and  notch osteophytes as well as the cruciate ligaments were then resected. We continued to work our way around posteriorly along the proximal tibia, and externally rotated the tibia subluxing it out from underneath the femur. A McHale PCL retractor was placed through the notch and a lateral Hohmann retractor placed, and we then entered the proximal tibia in line with the Depuy starter drill in line with the axis of the tibia followed by an intramedullary guide rod and 0-degree posterior slope cutting guide. The tibial cutting guide, 4 degree posterior sloped, was pinned into place allowing resection of 2 mm of bone medially and 8 mm of bone laterally. Satisfied with the tibial resection, we then entered the distal femur 2 mm anterior to the PCL origin with the intramedullary guide rod and applied the distal femoral cutting guide set at 9 mm, with 5  degrees of valgus. This was pinned along the epicondylar axis. At this point, the distal femoral cut was accomplished without difficulty. We then sized for a #6R femoral component and pinned the guide in 3 degrees of external rotation. The chamfer cutting guide was pinned into place. The anterior, posterior, and chamfer cuts were accomplished without difficulty followed by the Attune RP box cutting guide and the box cut. We also removed posterior osteophytes from the posterior femoral condyles. The posterior capsule was injected with Exparel solution. The knee was brought into full extension. We checked our extension gap and fit a 5 mm bearing. Distracting in extension with a lamina spreader,  bleeders in the posterior capsule, Posterior medial and posterior lateral gutter were cauterized.  The transexamic acid-soaked sponge was then placed in the gap of the knee in extension. The knee was flexed 30. The posterior patella cut was accomplished with the 9.5 mm Attune cutting guide, sized for a 26mm dome, and the fixation pegs drilled.The knee was then once again hyperflexed exposing the proximal tibia. We sized for a # 6 tibial base plate, applied the smokestack and the conical reamer followed by the the Delta fin keel punch. We then hammered into place the Attune RP trial femoral component, drilled the lugs, inserted a  5 mm trial bearing, trial patellar button, and took the knee through range of motion from 0-130 degrees. Medial and lateral ligamentous stability was checked. No thumb pressure was required for patellar Tracking. The tourniquet was not used. All trial components were removed, mating surfaces irrigated with pulse lavage, and dried with suction and sponges. 10 cc of the Exparel solution was applied to the cancellus bone of the patella distal femur and proximal tibia.  After waiting 30 seconds, the bony surfaces were again, dried with sponges. A double batch of DePuy HV cement was mixed and applied to  all bony metallic mating surfaces except for the posterior condyles of the femur itself. In order, we hammered into place the tibial tray and removed excess cement, the femoral component and removed excess cement. The final Attune RP bearing was inserted, and the knee brought to full extension with compression. The patellar button was clamped into place, and excess cement removed. The knee was held at 30 flexion with compression, while the cement cured. The wound was irrigated out with normal saline solution pulse lavage. The rest of the Exparel was injected into the parapatellar arthrotomy, subcutaneous tissues, and periosteal tissues. The parapatellar arthrotomy was closed with running #1 Vicryl suture. The subcutaneous tissue with 3-0 undyed Vicryl suture, and the skin with running 3-0 SQ vicryl. An Aquacil and Ace  wrap were applied. The patient was taken to recovery room without difficulty.   Nestor Lewandowsky 05/21/2021, 7:13 AM

## 2021-05-21 NOTE — Evaluation (Signed)
Physical Therapy Evaluation Patient Details Name: [name] MRN: [id number] DOB: [birth year] Today's Date: 05/21/2021  History of Present Illness  Pt is a 75 y.o. femlae s/p Lt TKA on 05/21/21. PMH significant for DM, GERD, HTN, Rt TKA (2020).  Clinical Impression  Pt is POD 0 s/p Lt TKA resulting in the deficits listed below (see PT Problem List). Pt performed sit to stand transfers with MIN A for safety and cues for safe hand placement. Pt ambulated total of ~19ft  with MIN A for safety, cues provided for step to gait patten with no LOB observed. Pt was limited with further distance due to onset of dizziness and nausea. PT reviewed circulation interventions for promotion of DVT prevention, pt demonstrated understanding. Pt will have assist from multiple family members, discussed importance of 24hr supervision at this time to maximize safety upon d/c, pt verbalized understanding. Pt will benefit from skilled PT to maximize safety with functional mobility and increase independence.         Recommendations for follow up therapy are one component of a multi-disciplinary discharge planning process, led by the attending physician.  Recommendations may be updated based on patient status, additional functional criteria and insurance authorization.  Follow Up Recommendations Follow physician's recommendations for discharge plan and follow up therapies    Assistance Recommended at Discharge Frequent or constant Supervision/Assistance  Functional Status Assessment Patient has had a recent decline in their functional status and demonstrates the ability to make significant improvements in function in a reasonable and predictable amount of time.  Equipment Recommendations  None recommended by PT (pt states she has RW)    Recommendations for Other Services       Precautions / Restrictions Precautions Precautions: Fall Restrictions Weight Bearing Restrictions: No Other Position/Activity  Restrictions: WBAT      Mobility  Bed Mobility Overal bed mobility: Needs Assistance Bed Mobility: Supine to Sit     Supine to sit: Min guard;HOB elevated     General bed mobility comments: increased time to complete. Pt with dizziness, vitals 150/86, 46bpm, 98% O2. Required prolonged sitting EOB prior to transfers for resolution of symptoms.    Transfers Overall transfer level: Needs assistance Equipment used: Rolling walker (2 wheels) Transfers: Sit to/from Stand Sit to Stand: Min assist           General transfer comment: MIN A for power up to rise with cues for safe hand placement. dizziness in standing, BP 160/67mmHg, HR 60bpm. Pt able to progress to ambulation following improvement in symptoms.    Ambulation/Gait Ambulation/Gait assistance: Min assist Gait Distance (Feet): 16 Feet Assistive device: Rolling walker (2 wheels) Gait Pattern/deviations: Step-to pattern;Decreased stride length;Decreased weight shift to left Gait velocity: decr     General Gait Details: pt able to perform lateral weight shifting and marches in standing with MIN A for stability. cues for step to gait pattern. Pt with onset of dizziness, nausea, and instability (anterior/posterior sway) following ambulation to doorway, assisted to recliner. vitals: 165/36mmHg, 49bpm. improvement in dizziness but still nauseous, RN notified and present at end of session.  Stairs            Wheelchair Mobility    Modified Rankin (Stroke Patients Only)       Balance Overall balance assessment: Needs assistance Sitting-balance support: Feet supported Sitting balance-Leahy Scale: Fair     Standing balance support: Reliant on assistive device for balance;Bilateral upper extremity supported;During functional activity Standing balance-Leahy Scale: Poor Standing balance comment: reliant on external  support                             Pertinent Vitals/Pain Pain Assessment: 0-10 Pain  Score: 7  Pain Location: Lt knee Pain Descriptors / Indicators: Discomfort;Sore;Tender Pain Intervention(s): Limited activity within patient's tolerance;Monitored during session;Repositioned;Ice applied    Home Living Family/patient expects to be discharged to:: Private residence Living Arrangements: Alone Available Help at Discharge: Family Type of Home: House Home Access: Stairs to enter Entrance Stairs-Rails: Right Entrance Stairs-Number of Steps: 2   Home Layout: Two level;Able to live on main level with bedroom/bathroom;Full bath on main level (2nd level is basement) Home Equipment: BSC/3in1;Crutches;Cane - single point;Rolling Walker (2 wheels) Additional Comments: daughter PRN (at nighttime most likely). interrmittent supervision during day    Prior Function Prior Level of Function : Independent/Modified Independent;Driving             Mobility Comments: no AD use       Hand Dominance   Dominant Hand: Right    Extremity/Trunk Assessment   Upper Extremity Assessment Upper Extremity Assessment: Overall WFL for tasks assessed    Lower Extremity Assessment Lower Extremity Assessment: LLE deficits/detail LLE Deficits / Details: fair quad set strength. 4-/5 B DF/PF strength LLE Sensation: decreased light touch LLE Coordination: WNL    Cervical / Trunk Assessment Cervical / Trunk Assessment: Normal  Communication   Communication: No difficulties  Cognition Arousal/Alertness: Awake/alert Behavior During Therapy: WFL for tasks assessed/performed Overall Cognitive Status: Within Functional Limits for tasks assessed                                          General Comments      Exercises Total Joint Exercises Ankle Circles/Pumps: AROM;Both;20 reps;Seated Quad Sets: AROM;Left;5 reps;Seated   Assessment/Plan    PT Assessment Patient needs continued PT services  PT Problem List Decreased strength;Decreased range of motion;Decreased activity  tolerance;Decreased balance;Decreased mobility;Decreased knowledge of use of DME;Pain       PT Treatment Interventions DME instruction;Gait training;Stair training;Functional mobility training;Therapeutic activities;Therapeutic exercise;Balance training;Patient/family education    PT Goals (Current goals can be found in the Care Plan section)  Acute Rehab PT Goals Patient Stated Goal: Feel better and go home PT Goal Formulation: With patient Time For Goal Achievement: 06/04/21 Potential to Achieve Goals: Good    Frequency 7X/week   Barriers to discharge        Co-evaluation               AM-PAC PT "6 Clicks" Mobility  Outcome Measure Help needed turning from your back to your side while in a flat bed without using bedrails?: A Little Help needed moving from lying on your back to sitting on the side of a flat bed without using bedrails?: A Little Help needed moving to and from a bed to a chair (including a wheelchair)?: A Little Help needed standing up from a chair using your arms (e.g., wheelchair or bedside chair)?: A Little Help needed to walk in hospital room?: A Little Help needed climbing 3-5 steps with a railing? : A Lot 6 Click Score: 17    End of Session Equipment Utilized During Treatment: Gait belt Activity Tolerance: Treatment limited secondary to medical complications (Comment) (dizziness/nausea) Patient left: in chair;with call bell/phone within reach;with chair alarm set;with nursing/sitter in room Nurse Communication: Mobility status  PT Visit Diagnosis: Unsteadiness on feet (R26.81);Muscle weakness (generalized) (M62.81);Pain Pain - Right/Left: Left Pain - part of body: Knee    Time: 8185-6314 PT Time Calculation (min) (ACUTE ONLY): 34 min   Charges:   PT Evaluation $PT Eval Low Complexity: 1 Low PT Treatments $Therapeutic Activity: 8-22 mins        Lyman Speller PT, DPT  Acute Rehabilitation Services  Office [phone]  05/21/2021, 5:56  PM

## 2021-05-22 ENCOUNTER — Encounter (HOSPITAL_COMMUNITY): Payer: Self-pay | Admitting: Orthopedic Surgery

## 2021-05-22 DIAGNOSIS — E1169 Type 2 diabetes mellitus with other specified complication: Secondary | ICD-10-CM | POA: Diagnosis not present

## 2021-05-22 DIAGNOSIS — Z794 Long term (current) use of insulin: Secondary | ICD-10-CM | POA: Diagnosis not present

## 2021-05-22 DIAGNOSIS — Z7984 Long term (current) use of oral hypoglycemic drugs: Secondary | ICD-10-CM | POA: Diagnosis not present

## 2021-05-22 DIAGNOSIS — Z20822 Contact with and (suspected) exposure to covid-19: Secondary | ICD-10-CM | POA: Diagnosis not present

## 2021-05-22 DIAGNOSIS — E669 Obesity, unspecified: Secondary | ICD-10-CM | POA: Diagnosis not present

## 2021-05-22 DIAGNOSIS — Z79899 Other long term (current) drug therapy: Secondary | ICD-10-CM | POA: Diagnosis not present

## 2021-05-22 DIAGNOSIS — Z96651 Presence of right artificial knee joint: Secondary | ICD-10-CM | POA: Diagnosis not present

## 2021-05-22 DIAGNOSIS — M1712 Unilateral primary osteoarthritis, left knee: Secondary | ICD-10-CM | POA: Diagnosis not present

## 2021-05-22 DIAGNOSIS — I10 Essential (primary) hypertension: Secondary | ICD-10-CM | POA: Diagnosis not present

## 2021-05-22 LAB — CBC
HCT: 33.7 % — ABNORMAL LOW (ref 36.0–46.0)
Hemoglobin: 10.5 g/dL — ABNORMAL LOW (ref 12.0–15.0)
MCH: 28.6 pg (ref 26.0–34.0)
MCHC: 31.2 g/dL (ref 30.0–36.0)
MCV: 91.8 fL (ref 80.0–100.0)
Platelets: 198 10*3/uL (ref 150–400)
RBC: 3.67 MIL/uL — ABNORMAL LOW (ref 3.87–5.11)
RDW: 15 % (ref 11.5–15.5)
WBC: 11.2 10*3/uL — ABNORMAL HIGH (ref 4.0–10.5)
nRBC: 0 % (ref 0.0–0.2)

## 2021-05-22 LAB — GLUCOSE, CAPILLARY
Glucose-Capillary: 114 mg/dL — ABNORMAL HIGH (ref 70–99)
Glucose-Capillary: 230 mg/dL — ABNORMAL HIGH (ref 70–99)
Glucose-Capillary: 189 mg/dL — ABNORMAL HIGH (ref 70–99)
Glucose-Capillary: 135 mg/dL — ABNORMAL HIGH (ref 70–99)

## 2021-05-22 LAB — BASIC METABOLIC PANEL
Anion gap: 7 (ref 5–15)
BUN: 9 mg/dL (ref 8–23)
CO2: 23 mmol/L (ref 22–32)
Calcium: 8.9 mg/dL (ref 8.9–10.3)
Chloride: 108 mmol/L (ref 98–111)
Creatinine, Ser: 0.67 mg/dL (ref 0.44–1.00)
GFR, Estimated: >60 mL/min (ref >60)
Glucose, Bld: 137 mg/dL — ABNORMAL HIGH (ref 70–99)
Potassium: 4.8 mmol/L (ref 3.5–5.1)
Sodium: 138 mmol/L (ref 135–145)

## 2021-05-22 NOTE — Progress Notes (Signed)
PATIENT ID: [name]  MRN: [id number]  DOB/AGE:  [birth year] / 75 y.o.  1 Day Post-Op Procedure(s) (LRB): LEFT TOTAL KNEE ARTHROPLASTY (Left)    PROGRESS NOTE Subjective: Patient is alert, oriented, no Nausea, no Vomiting, yes passing gas. Taking PO well. Denies SOB, Chest or Calf Pain. Using Incentive Spirometer, PAS in place. Ambulate 16', Patient reports pain as 4/10 .    Objective: Vital signs in last 24 hours: Vitals:   05/21/21 1255 05/21/21 2130 05/22/21 0131 05/22/21 0522  BP: 138/77 (!) 164/87 128/67 107/60  Pulse: (!) 42 62 60 61  Resp: 16 16 16 16   Temp:  97.6 F (36.4 C) 98.6 F (37 C) 98.9 F (37.2 C)  TempSrc:  Oral Oral Oral  SpO2: 100% 99% 99% 98%  Weight:          Intake/Output from previous day: I/O last 3 completed shifts: In: 2203.1 [P.O.:120; I.V.:2083.1] Out: 200 [Blood:200]   Intake/Output this shift: No intake/output data recorded.   LABORATORY DATA: Recent Labs    05/21/21 1707 05/21/21 2132 05/22/21 0328 05/22/21 0737  WBC  --   --  11.2*  --   HGB  --   --  10.5*  --   HCT  --   --  33.7*  --   PLT  --   --  198  --   NA  --   --  138  --   K  --   --  4.8  --   CL  --   --  108  --   CO2  --   --  23  --   BUN  --   --  9  --   CREATININE  --   --  0.67  --   GLUCOSE  --   --  137*  --   GLUCAP 164* 175*  --  114*  CALCIUM  --   --  8.9  --     Examination: Neurologically intact ABD soft Neurovascular intact Sensation intact distally Intact pulses distally Dorsiflexion/Plantar flexion intact Incision: dressing C/D/I No cellulitis present Compartment soft}  Assessment:   1 Day Post-Op Procedure(s) (LRB): LEFT TOTAL KNEE ARTHROPLASTY (Left) ADDITIONAL DIAGNOSIS: Expected Acute Blood Loss Anemia, History of sleep apnea, diabetes type 2, hyperlipidemia, hypertension, obesity.  Patient's anticipated LOS is less than 2 midnights, meeting these requirements: - Younger than 47 - Lives within 1 hour of care - Has a  competent adult at home to recover with post-op recover - NO history of  - Chronic pain requiring opiods  - Coronary Artery Disease  - Heart failure  - Heart attack  - Stroke  - DVT/VTE  - Cardiac arrhythmia  - Respiratory Failure/COPD  - Renal failure  - Anemia  - Advanced Liver disease     Plan: PT/OT WBAT, AROM and PROM  DVT Prophylaxis:  SCDx72hrs, ASA 81 mg BID x 2 weeks DISCHARGE PLAN: Home, Should be able to go home today if she passes physical therapy. DISCHARGE NEEDS: HHPT, Walker, and 3-in-1 comode seat     75 05/22/2021, 8:12 AM  Patient ID: 05/24/2021, female   DOB: Dec 27, 1945, 75 y.o.   MRN: [id number]

## 2021-05-22 NOTE — Progress Notes (Signed)
Physical Therapy Treatment Patient Details Name: [name] MRN: [id number] DOB: Sep 27, 1945 Today's Date: 05/22/2021   History of Present Illness Pt is a 75 y.o. femlae s/p Lt TKA on 05/21/21. PMH significant for DM, GERD, HTN, Rt TKA (2020).    PT Comments    Pt is slowly progressing progressing toward acute PT goals this session with increased ambulation distance of 53ft x2. Pt remains limited by onset of nausea/lightheadedness with mobility, vitals stable. Pt states that her and "anesthesia do not get along." Pt ambulated with MIN A and cues for step to gait pattern. PT reviewed LE HEP, pt demonstrated understanding. Pt reports that she will have intermittent assist from her daughter (daughter works) and has other family/friends who can check in intermittently but will have times where she is alone at home. Pt is currently NOT at a safe mobility level for d/c to home. Pt will benefit from continued skilled PT to progress mobility and stair training, increase their independence and maximize safety with mobility.     Recommendations for follow up therapy are one component of a multi-disciplinary discharge planning process, led by the attending physician.  Recommendations may be updated based on patient status, additional functional criteria and insurance authorization.  Follow Up Recommendations  Follow physician's recommendations for discharge plan and follow up therapies     Assistance Recommended at Discharge Frequent or constant Supervision/Assistance  Equipment Recommendations  None recommended by PT (pt states she has RW)    Recommendations for Other Services       Precautions / Restrictions Precautions Precautions: Fall Restrictions Weight Bearing Restrictions: No Other Position/Activity Restrictions: WBAT     Mobility  Bed Mobility               General bed mobility comments: OOB in recliner upon entry    Transfers Overall transfer level: Needs  assistance Equipment used: Rolling walker (2 wheels) Transfers: Sit to/from Stand Sit to Stand: Min guard           General transfer comment: x2 from recliner; MIN guard for safety with use of B UEs to power up from chair. Pt with mild lightheadedness in standing.  improved with prolonged standing and able to progress to ambulation.    Ambulation/Gait Ambulation/Gait assistance: Min assist Gait Distance (Feet): 20 Feet (13ft x2) Assistive device: Rolling walker (2 wheels) Gait Pattern/deviations: Step-to pattern;Decreased stride length;Decreased weight shift to left Gait velocity: decr     General Gait Details: 73ft x2 with seated rest break between; cues for step to gait pattern. Pt with onset of fatigue and nausea, x1 anterior/posterior sway in standing, assisted to recliner. vitals: 139/19mmHg, 57bpm. mild improvement in symptoms, still nauseous. Pt able to ambulate back to room, RN notified.   Stairs             Wheelchair Mobility    Modified Rankin (Stroke Patients Only)       Balance Overall balance assessment: Needs assistance Sitting-balance support: Feet supported Sitting balance-Leahy Scale: Fair     Standing balance support: Reliant on assistive device for balance;Bilateral upper extremity supported;During functional activity Standing balance-Leahy Scale: Poor Standing balance comment: reliant on external support                            Cognition Arousal/Alertness: Awake/alert Behavior During Therapy: WFL for tasks assessed/performed Overall Cognitive Status: Within Functional Limits for tasks assessed  Exercises Total Joint Exercises Ankle Circles/Pumps: AROM;Both;20 reps;Seated Quad Sets: AROM;Left;10 reps;Seated Short Arc Quad: AROM;Left;10 reps;Seated Heel Slides: Left;10 reps;Seated;AAROM Hip ABduction/ADduction: AROM;Left;10 reps;Seated Straight Leg Raises:  AAROM;Left;10 reps;Seated    General Comments        Pertinent Vitals/Pain Pain Assessment: 0-10 Pain Score: 8  Pain Location: Lt knee Pain Descriptors / Indicators: Discomfort;Tender;Sharp;Sore Pain Intervention(s): Limited activity within patient's tolerance;Monitored during session;Repositioned;Premedicated before session;Ice applied    Home Living Family/patient expects to be discharged to:: Private residence Living Arrangements: Alone Available Help at Discharge: Family Type of Home: House Home Access: Stairs to enter Entrance Stairs-Rails: Right Entrance Stairs-Number of Steps: 2   Home Layout: Two level;Able to live on main level with bedroom/bathroom;Full bath on main level (2nd level is basement) Home Equipment: BSC/3in1;Crutches;Cane - single point;Rolling Walker (2 wheels) Additional Comments: daughter PRN (at nighttime most likely). interrmittent supervision during day    Prior Function            PT Goals (current goals can now be found in the care plan section) Acute Rehab PT Goals Patient Stated Goal: Feel better and go home PT Goal Formulation: With patient Time For Goal Achievement: 06/04/21 Potential to Achieve Goals: Good Progress towards PT goals: Progressing toward goals    Frequency    7X/week      PT Plan Current plan remains appropriate    Co-evaluation              AM-PAC PT "6 Clicks" Mobility   Outcome Measure  Help needed turning from your back to your side while in a flat bed without using bedrails?: A Little Help needed moving from lying on your back to sitting on the side of a flat bed without using bedrails?: A Little Help needed moving to and from a bed to a chair (including a wheelchair)?: A Little Help needed standing up from a chair using your arms (e.g., wheelchair or bedside chair)?: A Little Help needed to walk in hospital room?: A Little Help needed climbing 3-5 steps with a railing? : A Lot 6 Click Score: 17     End of Session Equipment Utilized During Treatment: Gait belt Activity Tolerance: Treatment limited secondary to medical complications (Comment) (onset of nausea) Patient left: in chair;with call bell/phone within reach;with chair alarm set Nurse Communication: Mobility status (pt's symptoms/vitals during mobility) PT Visit Diagnosis: Unsteadiness on feet (R26.81);Muscle weakness (generalized) (M62.81);Pain Pain - Right/Left: Left Pain - part of body: Knee     Time: 0921-0957 PT Time Calculation (min) (ACUTE ONLY): 36 min  Charges:  $Therapeutic Exercise: 8-22 mins $Therapeutic Activity: 8-22 mins                     Lyman Speller PT, DPT  Acute Rehabilitation Services  Office [phone]   05/22/2021, 10:58 AM

## 2021-05-22 NOTE — Progress Notes (Signed)
Physical Therapy Treatment Patient Details Name: [name] MRN: [id number] DOB: Mar 03, 1946 Today's Date: 05/22/2021   History of Present Illness Pt is a 75 y.o. femlae s/p Lt TKA on 05/21/21. PMH significant for DM, GERD, HTN, Rt TKA (2020).    PT Comments    Pt is pod #1 s/p Lt TKA. Pt is slowly progressing toward acute PT goals this session with progression to stair training. Pt performed stair training with use of R handrail, mild Lt knee buckling observed and Min A required for stability. Pt using wall on Lt side during stair negotiation for improved stability. Reports she uses handrail and wall at home prior to surgery. Pt has cane at home and may benefit from education of use of AD in addition to R handrail in order to maximize safety. Pt remains limited by onset of dizziness leading to LOB during mobility requiring up to MIN A from therapist to maintain standing balance. PT reviewed performance of LE HEP outside of therapy sessions for promotion of circulation and improved ROM/strength. Pt reports that she will have intermittent assist from her daughter (daughter works) and has other family/friends who can check in intermittently, but will have times where she is alone at home. Discussed importance of 24hr supervision at this time due to current limitations with mobility. Pt is currently NOT at a safe mobility level for d/c to home. Pt will benefit from continued skilled PT to progress mobility and stair training, increase their independence and maximize safety with mobility.     Recommendations for follow up therapy are one component of a multi-disciplinary discharge planning process, led by the attending physician.  Recommendations may be updated based on patient status, additional functional criteria and insurance authorization.  Follow Up Recommendations  Follow physician's recommendations for discharge plan and follow up therapies     Assistance Recommended at Discharge Frequent  or constant Supervision/Assistance  Equipment Recommendations  None recommended by PT (pt states she has RW)    Recommendations for Other Services       Precautions / Restrictions Precautions Precautions: Fall Restrictions Weight Bearing Restrictions: No Other Position/Activity Restrictions: WBAT     Mobility  Bed Mobility               General bed mobility comments: OOB in recliner upon entry    Transfers Overall transfer level: Needs assistance Equipment used: Rolling walker (2 wheels) Transfers: Sit to/from Stand Sit to Stand: Min guard           General transfer comment: from recliner; MIN guard for safety with use of B UEs to power up from chair and increased time to rise. Pt with mild lightheadedness in standing, requiring standing rest prior to advancement of mobility. Able to progress to ambulation.    Ambulation/Gait Ambulation/Gait assistance: Min assist;Min guard Gait Distance (Feet): 38 Feet Assistive device: Rolling walker (2 wheels) Gait Pattern/deviations: Step-to pattern;Decreased stride length;Decreased weight shift to left Gait velocity: decr     General Gait Details: cues for step to gait pattern. Pt with onset of fatigue and associated increase in dizziness (feels like her head is spinning, not baseline), x3 LOB during ambulation where pt removed single UE from RW and grabbing onto stair handrail and sink counter in room for support following stair negotation requiring MIN A for stability/maintenance of standing balance. Pt assisted to recliner. vitals: 138/64mmHg, 54bpm. No reports of nausea this session, RN notified. Care partner also updated on use of [facility] for toileting due to repeated  onset of symptoms with ambulation.   Stairs Stairs: Yes Stairs assistance: Min assist Stair Management: One rail Right;Forwards (use of wall on L side) Number of Stairs: 5 General stair comments: Intermittent mild buckling of Rt knee observed. MIN A for  stability required with stair negotation and cues for proper sequencing and safe guarding positoin for family members at home provided. Pt reports she uses R handrail at home and wall on L side. Pt reaching for wall on Lt side during stair negotiation today for improved stability with B UE support.   Wheelchair Mobility    Modified Rankin (Stroke Patients Only)       Balance Overall balance assessment: Needs assistance Sitting-balance support: Feet supported Sitting balance-Leahy Scale: Good     Standing balance support: Reliant on assistive device for balance;Bilateral upper extremity supported;During functional activity Standing balance-Leahy Scale: Poor Standing balance comment: reliant on external support                            Cognition Arousal/Alertness: Awake/alert Behavior During Therapy: WFL for tasks assessed/performed Overall Cognitive Status: Within Functional Limits for tasks assessed                                          Exercises Total Joint Exercises Ankle Circles/Pumps: AROM;Both;20 reps;Seated Quad Sets: AROM;Left;10 reps;Seated Short Arc Quad: AROM;Left;10 reps;Seated Heel Slides: Left;10 reps;Seated;AAROM Hip ABduction/ADduction: AROM;Left;10 reps;Seated Straight Leg Raises: AAROM;Left;10 reps;Seated    General Comments        Pertinent Vitals/Pain Pain Assessment: 0-10 Pain Score: 6  Pain Location: Lt knee Pain Descriptors / Indicators: Discomfort;Tender;Sore Pain Intervention(s): Limited activity within patient's tolerance;Monitored during session;Repositioned;Ice applied    Home Living Family/patient expects to be discharged to:: Private residence Living Arrangements: Alone Available Help at Discharge: Family Type of Home: House Home Access: Stairs to enter Entrance Stairs-Rails: Right Entrance Stairs-Number of Steps: 2   Home Layout: Two level;Able to live on main level with bedroom/bathroom;Full bath  on main level (2nd level is basement) Home Equipment: BSC/3in1;Crutches;Cane - single point;Rolling Walker (2 wheels) Additional Comments: daughter PRN (at nighttime most likely). interrmittent supervision during day    Prior Function            PT Goals (current goals can now be found in the care plan section) Acute Rehab PT Goals Patient Stated Goal: Feel better and go home PT Goal Formulation: With patient Time For Goal Achievement: 06/04/21 Potential to Achieve Goals: Good Progress towards PT goals: Progressing toward goals    Frequency    7X/week      PT Plan Current plan remains appropriate    Co-evaluation              AM-PAC PT "6 Clicks" Mobility   Outcome Measure  Help needed turning from your back to your side while in a flat bed without using bedrails?: A Little Help needed moving from lying on your back to sitting on the side of a flat bed without using bedrails?: A Little Help needed moving to and from a bed to a chair (including a wheelchair)?: A Little Help needed standing up from a chair using your arms (e.g., wheelchair or bedside chair)?: A Little Help needed to walk in hospital room?: A Little Help needed climbing 3-5 steps with a railing? : A Little 6 Click Score: 18  End of Session Equipment Utilized During Treatment: Gait belt Activity Tolerance: Treatment limited secondary to medical complications (Comment);Patient limited by fatigue (onset of dizziness) Patient left: in chair;with call bell/phone within reach;with chair alarm set Nurse Communication: Mobility status (pt's symptoms/vitals during mobility) PT Visit Diagnosis: Unsteadiness on feet (R26.81);Muscle weakness (generalized) (M62.81);Pain Pain - Right/Left: Left Pain - part of body: Knee     Time: 1205-1229 PT Time Calculation (min) (ACUTE ONLY): 24 min  Charges:  $Gait Training: 8-22 mins $Therapeutic Exercise: 8-22 mins $Therapeutic Activity: 8-22 mins                     Jetta Lout, DPT  Acute Rehabilitation Services  Office [phone]  05/22/2021, 12:51 PM

## 2021-05-22 NOTE — TOC Transition Note (Signed)
Transition of Care [facility]) - CM/SW Discharge Note   Patient Details  Name: [name] MRN: [id number] Date of Birth: [birth year]  Transition of Care [facility]) CM/SW Contact:  Lennart Pall, LCSW Phone Number: 05/22/2021, 10:59 AM   Clinical Narrative:    Met with pt and confirming she has all needed DME at home.  Plan for HHPT prearranged with [facility] initially and then transition to OPPT.  No TOC needs.   Final next level of care: [place] Barriers to Discharge: No Barriers Identified   Patient Goals and CMS Choice Patient states their goals for this hospitalization and ongoing recovery are:: return home      Discharge Placement                       Discharge Plan and Services                DME Arranged: N/A DME Agency: NA       HH Arranged: PT [place] Agency: [place]        Social Determinants of Health (SDOH) Interventions     Readmission Risk Interventions No flowsheet data found.

## 2021-05-23 DIAGNOSIS — Z20822 Contact with and (suspected) exposure to covid-19: Secondary | ICD-10-CM | POA: Diagnosis not present

## 2021-05-23 DIAGNOSIS — Z96651 Presence of right artificial knee joint: Secondary | ICD-10-CM | POA: Diagnosis not present

## 2021-05-23 DIAGNOSIS — Z7984 Long term (current) use of oral hypoglycemic drugs: Secondary | ICD-10-CM | POA: Diagnosis not present

## 2021-05-23 DIAGNOSIS — E1169 Type 2 diabetes mellitus with other specified complication: Secondary | ICD-10-CM | POA: Diagnosis not present

## 2021-05-23 DIAGNOSIS — E669 Obesity, unspecified: Secondary | ICD-10-CM | POA: Diagnosis not present

## 2021-05-23 DIAGNOSIS — Z79899 Other long term (current) drug therapy: Secondary | ICD-10-CM | POA: Diagnosis not present

## 2021-05-23 DIAGNOSIS — I10 Essential (primary) hypertension: Secondary | ICD-10-CM | POA: Diagnosis not present

## 2021-05-23 DIAGNOSIS — Z794 Long term (current) use of insulin: Secondary | ICD-10-CM | POA: Diagnosis not present

## 2021-05-23 DIAGNOSIS — M1712 Unilateral primary osteoarthritis, left knee: Secondary | ICD-10-CM | POA: Diagnosis not present

## 2021-05-23 LAB — GLUCOSE, CAPILLARY
Glucose-Capillary: 102 mg/dL — ABNORMAL HIGH (ref 70–99)
Glucose-Capillary: 136 mg/dL — ABNORMAL HIGH (ref 70–99)
Glucose-Capillary: 101 mg/dL — ABNORMAL HIGH (ref 70–99)
Glucose-Capillary: 143 mg/dL — ABNORMAL HIGH (ref 70–99)

## 2021-05-23 LAB — CBC
HCT: 35.2 % — ABNORMAL LOW (ref 36.0–46.0)
Hemoglobin: 10.5 g/dL — ABNORMAL LOW (ref 12.0–15.0)
MCH: 28.8 pg (ref 26.0–34.0)
MCHC: 29.8 g/dL — ABNORMAL LOW (ref 30.0–36.0)
MCV: 96.7 fL (ref 80.0–100.0)
Platelets: 174 10*3/uL (ref 150–400)
RBC: 3.64 MIL/uL — ABNORMAL LOW (ref 3.87–5.11)
RDW: 15.6 % — ABNORMAL HIGH (ref 11.5–15.5)
WBC: 12 10*3/uL — ABNORMAL HIGH (ref 4.0–10.5)
nRBC: 0 % (ref 0.0–0.2)

## 2021-05-23 NOTE — Progress Notes (Signed)
PATIENT ID: [name]  MRN: [id number]  DOB/AGE:  May 12, 1946 / 75 y.o.  2 Days Post-Op Procedure(s) (LRB): LEFT TOTAL KNEE ARTHROPLASTY (Left)    PROGRESS NOTE Subjective: Patient is alert, oriented, no Nausea, no Vomiting, yes passing gas. Taking PO well. Denies SOB, Chest or Calf Pain. Using Incentive Spirometer, PAS in place. Ambulate WBAT with pt walking 38 ft with therapy, Patient reports pain as moderate.    Objective: Vital signs in last 24 hours: Vitals:   05/22/21 1010 05/22/21 1404 05/22/21 2129 05/23/21 0545  BP: 114/66 126/67 127/62 121/62  Pulse: (!) 52 (!) 54 74 65  Resp: 18 18 16 16   Temp: 97.9 F (36.6 C) 98.9 F (37.2 C) 98.7 F (37.1 C) 98.9 F (37.2 C)  TempSrc: Oral Oral Oral Oral  SpO2: 97% 97% 95% 93%  Weight:      Height:          Intake/Output from previous day: I/O last 3 completed shifts: In: 1453.1 [P.O.:120; I.V.:1333.1] Out: -    Intake/Output this shift: No intake/output data recorded.   LABORATORY DATA: Recent Labs    05/22/21 0328 05/22/21 0737 05/22/21 1625 05/22/21 2134 05/23/21 0336 05/23/21 0738  WBC 11.2*  --   --   --  12.0*  --   HGB 10.5*  --   --   --  10.5*  --   HCT 33.7*  --   --   --  35.2*  --   PLT 198  --   --   --  174  --   NA 138  --   --   --   --   --   K 4.8  --   --   --   --   --   CL 108  --   --   --   --   --   CO2 23  --   --   --   --   --   BUN 9  --   --   --   --   --   CREATININE 0.67  --   --   --   --   --   GLUCOSE 137*  --   --   --   --   --   GLUCAP  --    < > 189* 135*  --  102*  CALCIUM 8.9  --   --   --   --   --    < > = values in this interval not displayed.    Examination: Neurologically intact Neurovascular intact Sensation intact distally Intact pulses distally Dorsiflexion/Plantar flexion intact No cellulitis present Compartment soft}  Assessment:   2 Days Post-Op Procedure(s) (LRB): LEFT TOTAL KNEE ARTHROPLASTY (Left) ADDITIONAL DIAGNOSIS: Expected Acute Blood  Loss Anemia, History of sleep apnea, diabetes type 2, hyperlipidemia, hypertension, obesity.   Plan: PT/OT WBAT, AROM and PROM  DVT Prophylaxis:  SCDx72hrs, ASA 81 mg BID x 2 weeks DISCHARGE PLAN: Home, once patient meets therapy goals DISCHARGE NEEDS: HHPT, Walker, and 3-in-1 comode seat     05/25/21 05/23/2021, 8:11 AM

## 2021-05-23 NOTE — Plan of Care (Signed)
  Problem: Activity: Goal: Ability to avoid complications of mobility impairment will improve Outcome: Progressing   Problem: Clinical Measurements: Goal: Postoperative complications will be avoided or minimized Outcome: Progressing   Problem: Pain Management: Goal: Pain level will decrease with appropriate interventions Outcome: Progressing   

## 2021-05-23 NOTE — Progress Notes (Signed)
Physical Therapy Treatment Patient Details Name: [name] MRN: [id number] DOB: 01-21-1946 Today's Date: 05/23/2021   History of Present Illness Pt is a 75 y.o. femlae s/p Lt TKA on 05/21/21. PMH significant for DM, GERD, HTN, Rt TKA (2020).    PT Comments    Pt making gradual progress, but continues to be limited by pain and occasional lightheadedness/weakness (VSS).   Barrier to d/c is limited support at home - pt will either need near 24 hrs support/support for all mobility  at this time or needs to progress independence level.  Continue to progress as able.    Recommendations for follow up therapy are one component of a multi-disciplinary discharge planning process, led by the attending physician.  Recommendations may be updated based on patient status, additional functional criteria and insurance authorization.  Follow Up Recommendations  Follow physician's recommendations for discharge plan and follow up therapies     Assistance Recommended at Discharge Frequent or constant Supervision/Assistance  Equipment Recommendations  None recommended by PT    Recommendations for Other Services       Precautions / Restrictions Precautions Precautions: Fall Restrictions Other Position/Activity Restrictions: WBAT     Mobility  Bed Mobility Overal bed mobility: Needs Assistance Bed Mobility: Sit to Supine     Supine to sit: Min assist Sit to supine: Min assist   General bed mobility comments: Min A for L knee    Transfers Overall transfer level: Needs assistance Equipment used: Rolling walker (2 wheels) Transfers: Sit to/from Stand Sit to Stand: Min assist;Min guard           General transfer comment: Min guard to rise with cues for L LE management and min A to sit for controlled descent; performed x 4 during session;    Ambulation/Gait Ambulation/Gait assistance: Min guard Gait Distance (Feet): 65 Feet Assistive device: Rolling walker (2 wheels) Gait  Pattern/deviations: Step-to pattern;Decreased stride length;Antalgic Gait velocity: decr     General Gait Details: Pt with small steps at slow speed.  This afternoon , R LE (good leg) started well but with fatigue again demonstrated instability and partial buckling in stance but pt able to control - she reports this has been her baseline since surgery and she has spoken with surgeon about it in the past. No buckling on L today.   Stairs Stairs: Yes Stairs assistance: Min assist;+2 safety/equipment Stair Management: One rail Right;With cane;Forwards Number of Stairs: 3 General stair comments: Utilized R rail and can on L; cues for sequence with min A to steady; had some lightheadedness/weakness after steps but quickly recovered (did add chair follow for remaining gait)   Wheelchair Mobility    Modified Rankin (Stroke Patients Only)       Balance Overall balance assessment: Needs assistance Sitting-balance support: Feet supported Sitting balance-Leahy Scale: Good     Standing balance support: Bilateral upper extremity supported;Reliant on assistive device for balance Standing balance-Leahy Scale: Poor Standing balance comment: reliant on external support and min A at times                            Cognition Arousal/Alertness: Awake/alert Behavior During Therapy: WFL for tasks assessed/performed Overall Cognitive Status: Within Functional Limits for tasks assessed  Exercises Total Joint Exercises Ankle Circles/Pumps: AROM;Both;10 reps;Seated Quad Sets: AROM;Both;10 reps;Supine Towel Squeeze: AROM;Both;10 reps;Supine Heel Slides: AAROM;Left;5 reps;Supine Hip ABduction/ADduction: AAROM;Left;5 reps;Supine Long Arc Quad: AAROM;Left;5 reps;Supine Knee Flexion: AAROM;Left;5 reps;Supine Goniometric ROM: L knee 5 to 15 degrees; limited by pain Other Exercises Other Exercises: Limited reps as needed due to  pain; AAROM required    General Comments General comments (skin integrity, edema, etc.): Pt continues to have what she describes as "weakness" upon standing that quickly improves.  Checked again and orthostatic BP stable.  Advised OOB as much as possible (to bathroom, up for meals), being sure to drink and eat well, AROM for blood flow, and slow transfers.      Pertinent Vitals/Pain Pain Assessment: 0-10 Pain Score: 7  Pain Location: Lt knee Pain Descriptors / Indicators: Discomfort;Tender;Sore Pain Intervention(s): Limited activity within patient's tolerance;Monitored during session;Repositioned    Home Living                          Prior Function            PT Goals (current goals can now be found in the care plan section) Progress towards PT goals: Progressing toward goals    Frequency    7X/week      PT Plan Current plan remains appropriate    Co-evaluation              AM-PAC PT "6 Clicks" Mobility   Outcome Measure  Help needed turning from your back to your side while in a flat bed without using bedrails?: A Little Help needed moving from lying on your back to sitting on the side of a flat bed without using bedrails?: A Little Help needed moving to and from a bed to a chair (including a wheelchair)?: A Little Help needed standing up from a chair using your arms (e.g., wheelchair or bedside chair)?: A Little Help needed to walk in hospital room?: A Little Help needed climbing 3-5 steps with a railing? : A Little 6 Click Score: 18    End of Session Equipment Utilized During Treatment: Gait belt Activity Tolerance: Patient limited by pain Patient left: with call bell/phone within reach;in bed;with bed alarm set Nurse Communication: Mobility status PT Visit Diagnosis: Unsteadiness on feet (R26.81);Muscle weakness (generalized) (M62.81);Pain Pain - Right/Left: Left Pain - part of body: Knee     Time: 5284-1324 PT Time Calculation (min)  (ACUTE ONLY): 28 min  Charges:  $Gait Training: 8-22 mins $Therapeutic Exercise: 8-22 mins                     Anise Salvo, PT Acute Rehab Services Pager [phone] [facility] [phone]    Rayetta Humphrey 05/23/2021, 5:18 PM

## 2021-05-23 NOTE — Progress Notes (Signed)
Physical Therapy Treatment Patient Details Name: [name] MRN: [id number] DOB: 02/05/1946 Today's Date: 05/23/2021   History of Present Illness Pt is a 75 y.o. femlae s/p Lt TKA on 05/21/21. PMH significant for DM, GERD, HTN, Rt TKA (2020).    PT Comments    Pt making gradual progress but remains unsafe to return home at current level of mobility and with limited support from PT perspective.  She ambulated 32' today but demonstrates instability/mild buckling on R leg ("good" leg) - she reports present since her prior sx and she has discussed with surgeon on past. Pt with significant pain that limited exercises and ROM.  Requiring at least min A for all transfers.  Pt reports daughter works during day and runs her son to school, etc.  States she is trying to line up others but they also work and she still would have significant periods alone.    Recommendations for follow up therapy are one component of a multi-disciplinary discharge planning process, led by the attending physician.  Recommendations may be updated based on patient status, additional functional criteria and insurance authorization.  Follow Up Recommendations  Follow physician's recommendations for discharge plan and follow up therapies     Assistance Recommended at Discharge Frequent or constant Supervision/Assistance  Equipment Recommendations  None recommended by PT    Recommendations for Other Services       Precautions / Restrictions Precautions Precautions: Fall Restrictions Other Position/Activity Restrictions: WBAT     Mobility  Bed Mobility Overal bed mobility: Needs Assistance Bed Mobility: Supine to Sit     Supine to sit: Min assist     General bed mobility comments: Min A for L knee    Transfers Overall transfer level: Needs assistance Equipment used: Rolling walker (2 wheels) Transfers: Sit to/from Stand Sit to Stand: Min assist           General transfer comment: Min A from  bed and min guard from [facility]; Assist to steady and cues for L LE management.  Pt had some lightheadedness on BSC - VSS and no further episodes (HR 57 bpm and bp 135/62)    Ambulation/Gait Ambulation/Gait assistance: Min assist Gait Distance (Feet): 60 Feet Assistive device: Rolling walker (2 wheels) Gait Pattern/deviations: Step-to pattern;Decreased stride length;Antalgic Gait velocity: decr     General Gait Details: Pt with small steps at slow speed. No lightheadedness today.  Noted R knee ("good" knee, had sx ~ year ago) is unstable and partial buckling in stance but pt able to control - she reports this has been her baseline since surgery and she has spoken with surgeon about it in the past. No buckling on L today.   Stairs             Wheelchair Mobility    Modified Rankin (Stroke Patients Only)       Balance Overall balance assessment: Needs assistance Sitting-balance support: Feet supported Sitting balance-Leahy Scale: Good     Standing balance support: Bilateral upper extremity supported;Reliant on assistive device for balance Standing balance-Leahy Scale: Poor                              Cognition Arousal/Alertness: Awake/alert Behavior During Therapy: WFL for tasks assessed/performed Overall Cognitive Status: Within Functional Limits for tasks assessed  Exercises Total Joint Exercises Ankle Circles/Pumps: AROM;Both;10 reps;Supine Quad Sets: AROM;Both;10 reps;Supine Towel Squeeze: AROM;Both;10 reps;Supine Hip ABduction/ADduction: AAROM;Left;5 reps;Supine Long Arc Quad: AAROM;Left;5 reps;Supine Knee Flexion: AAROM;Left;5 reps;Supine Goniometric ROM: L knee 5 to 15 degrees - very limited by pain Other Exercises Other Exercises: Limited reps as needed due to pain; AAROM required    General Comments General comments (skin integrity, edema, etc.): Pt reports she is calling people trying  to find increased support      Pertinent Vitals/Pain Pain Assessment: 0-10 Pain Score: 7  Pain Location: Lt knee Pain Descriptors / Indicators: Discomfort;Tender;Sore Pain Intervention(s): Limited activity within patient's tolerance;Monitored during session;Repositioned;Ice applied    Home Living                          Prior Function            PT Goals (current goals can now be found in the care plan section) Progress towards PT goals: Progressing toward goals    Frequency    7X/week      PT Plan Current plan remains appropriate    Co-evaluation              AM-PAC PT "6 Clicks" Mobility   Outcome Measure  Help needed turning from your back to your side while in a flat bed without using bedrails?: A Little Help needed moving from lying on your back to sitting on the side of a flat bed without using bedrails?: A Little Help needed moving to and from a bed to a chair (including a wheelchair)?: A Little Help needed standing up from a chair using your arms (e.g., wheelchair or bedside chair)?: A Little Help needed to walk in hospital room?: A Little Help needed climbing 3-5 steps with a railing? : A Lot 6 Click Score: 17    End of Session Equipment Utilized During Treatment: Gait belt Activity Tolerance: Patient limited by pain Patient left: with chair alarm set;in chair;with call bell/phone within reach Nurse Communication: Mobility status (not ready for d/c) PT Visit Diagnosis: Unsteadiness on feet (R26.81);Muscle weakness (generalized) (M62.81);Pain     Time: 1249-1320 PT Time Calculation (min) (ACUTE ONLY): 31 min  Charges:  $Gait Training: 8-22 mins $Therapeutic Exercise: 8-22 mins                     Anise Salvo, PT Acute Rehab Services Pager [phone] [facility] [phone]    Rayetta Humphrey 05/23/2021, 1:36 PM

## 2021-05-23 NOTE — Discharge Summary (Deleted)
Patient ID: Angelica TRITCH MRN: [id number] DOB/AGE: 1946/06/24 75 y.o.  Admit date: 05/21/2021 Discharge date: 05/23/2021  Admission Diagnoses:  Principal Problem:   Degenerative arthritis of left knee Active Problems:   Status post total left knee replacement   Discharge Diagnoses:  Same  Past Medical History:  Diagnosis Date   Arthritis    hands   Bladder infection    Carpal tunnel syndrome    Complication of anesthesia    Difficult to arouse   Diabetes mellitus    GERD (gastroesophageal reflux disease)    Hypertension    Sleep apnea     Surgeries: Procedure(s): LEFT TOTAL KNEE ARTHROPLASTY on 05/21/2021   Consultants:   Discharged Condition: Improved  Hospital Course: [name] is an 75 y.o. female who was admitted 05/21/2021 for operative treatment ofDegenerative arthritis of left knee. Patient has severe unremitting pain that affects sleep, daily activities, and work/hobbies. After pre-op clearance the patient was taken to the operating room on 05/21/2021 and underwent  Procedure(s): LEFT TOTAL KNEE ARTHROPLASTY.    Patient was given perioperative antibiotics:  Anti-infectives (From admission, onward)    Start     Dose/Rate Route Frequency Ordered Stop   05/21/21 0600  ceFAZolin (ANCEF) IVPB 2g/100 mL premix        2 g 200 mL/hr over 30 Minutes Intravenous On call to O.R. 05/21/21 5465 05/21/21 6812        Patient was given sequential compression devices, early ambulation, and chemoprophylaxis to prevent DVT.  Patient benefited maximally from hospital stay and there were no complications.    Recent vital signs: Patient Vitals for the past 24 hrs:  BP Temp Temp src Pulse Resp SpO2 Height  05/23/21 0545 121/62 98.9 F (37.2 C) Oral 65 16 93 % --  05/22/21 2129 127/62 98.7 F (37.1 C) Oral 74 16 95 % --  05/22/21 1404 126/67 98.9 F (37.2 C) Oral (!) 54 18 97 % --  05/22/21 1010 114/66 97.9 F (36.6 C) Oral (!) 52 18 97 % --  05/22/21 1004  -- -- -- -- -- -- 5\' 3"  (1.6 m)     Recent laboratory studies:  Recent Labs    05/22/21 0328 05/23/21 0336  WBC 11.2* 12.0*  HGB 10.5* 10.5*  HCT 33.7* 35.2*  PLT 198 174  NA 138  --   K 4.8  --   CL 108  --   CO2 23  --   BUN 9  --   CREATININE 0.67  --   GLUCOSE 137*  --   CALCIUM 8.9  --      Discharge Medications:   Allergies as of 05/23/2021       Reactions   Pravastatin Hives   Haemophilus Influenzae Vaccines    Nausea, pain, body weakness   Sulfa Antibiotics Itching   Sulfonamide Derivatives Itching   Tetanus Toxoids Swelling   Arm swelled at injection site        Medication List     TAKE these medications    aspirin EC 81 MG tablet Take 1 tablet (81 mg total) by mouth 2 (two) times daily. What changed: when to take this   carvedilol 3.125 MG tablet Commonly known as: COREG carvedilol 3.125 mg tablet  Take 1 tablet twice a day by oral route.   cetirizine 10 MG tablet Commonly known as: ZYRTEC Take 10 mg by mouth daily as needed for allergies.   clotrimazole-betamethasone cream Commonly known as: LOTRISONE Apply topically 2 (  two) times daily as needed. What changed:  how much to take reasons to take this   CoQ10 100 MG Caps Take 100 mg by mouth daily with lunch.   estradiol 0.1 MG/GM vaginal cream Commonly known as: ESTRACE Place 1 Applicatorful vaginally daily as needed.   famotidine 20 MG tablet Commonly known as: PEPCID Take 1 tablet (20 mg total) by mouth 2 (two) times daily.   fluticasone 50 MCG/ACT nasal spray Commonly known as: FLONASE Place 1 spray into both nostrils daily as needed for allergies or rhinitis.   furosemide 20 MG tablet Commonly known as: LASIX Take 1 tablet (20 mg total) by mouth daily as needed (fluid).   hydrochlorothiazide 12.5 MG capsule Commonly known as: MICROZIDE Take 12.5 mg by mouth daily as needed (excess fluid/swelling).   loratadine 10 MG tablet Commonly known as: CLARITIN Take 10 mg by  mouth daily as needed for allergies.   losartan 25 MG tablet Commonly known as: COZAAR Take 1 tablet (25 mg total) by mouth 2 (two) times a week. Take one tablet (25 mg) by mouth twice weekly - Tuesdays and Thursdays   metFORMIN 500 MG tablet Commonly known as: GLUCOPHAGE TAKE 1 TABLET BY MOUTH TWICE DAILY WITH A MEAL   OneTouch Delica Lancets 33G Misc USE   TO CHECK GLUCOSE BEFORE BREAKFAST AND  DINNER   oxyCODONE-acetaminophen 5-325 MG tablet Commonly known as: PERCOCET/ROXICET Take 1 tablet by mouth every 4 (four) hours as needed for severe pain.   OXYGEN Inhale 1 L into the lungs at bedtime as needed (oxygen).   potassium chloride SA 20 MEQ tablet Commonly known as: KLOR-CON M Take 1 tablet (20 mEq total) by mouth every morning.   PROBIOTIC FORMULA PO Take 1 capsule by mouth 2 (two) times a week.   rosuvastatin 20 MG tablet Commonly known as: CRESTOR Take 20 mg by mouth daily.   rosuvastatin 40 MG tablet Commonly known as: CRESTOR Take 1 tablet (40 mg total) by mouth daily.   tiZANidine 2 MG tablet Commonly known as: ZANAFLEX Take 1 tablet (2 mg total) by mouth every 6 (six) hours as needed.   vitamin C 1000 MG tablet Take 1,000 mg by mouth daily.   Vitamin D (Ergocalciferol) 1.25 MG (50000 UNIT) Caps capsule Commonly known as: DRISDOL Take one capsule twice weekly on Tues/Friday What changed:  how much to take how to take this when to take this additional instructions   Vitamin D 50 MCG (2000 UT) tablet Take 2,000 Units by mouth daily.               Durable Medical Equipment  (From admission, onward)           Start     Ordered   05/21/21 1054  DME Walker rolling  Once       Question:  Patient needs a walker to treat with the following condition  Answer:  Status post total left knee replacement   05/21/21 1053   05/21/21 1054  DME 3 n 1  Once        05/21/21 1053              Discharge Care Instructions  (From admission, onward)            Start     Ordered   05/23/21 0000  Weight bearing as tolerated        05/23/21 0814            Diagnostic Studies: DG  Chest 2 View  Result Date: 05/09/2021 CLINICAL DATA:  75 year old female under preoperative evaluation. EXAM: CHEST - 2 VIEW COMPARISON:  Chest x-ray 04/13/2019. FINDINGS: Lung volumes are normal. No consolidative airspace disease. No pleural effusions. No pneumothorax. No pulmonary nodule or mass noted. Pulmonary vasculature and the cardiomediastinal silhouette are within normal limits. IMPRESSION: No radiographic evidence of acute cardiopulmonary disease. Electronically Signed   By: Trudie Reed M.D.   On: 05/09/2021 16:41    Disposition: Discharge disposition: 01-Home or Self Care       Discharge Instructions     Call MD / Call 911   Complete by: As directed    If you experience chest pain or shortness of breath, CALL 911 and be transported to the hospital emergency room.  If you develope a fever above 101 F, pus (white drainage) or increased drainage or redness at the wound, or calf pain, call your surgeon's office.   Constipation Prevention   Complete by: As directed    Drink plenty of fluids.  Prune juice may be helpful.  You may use a stool softener, such as Colace (over the counter) 100 mg twice a day.  Use MiraLax (over the counter) for constipation as needed.   Diet - low sodium heart healthy   Complete by: As directed    Driving restrictions   Complete by: As directed    No driving for 2 weeks   Increase activity slowly as tolerated   Complete by: As directed    Patient may shower   Complete by: As directed    You may shower without a dressing once there is no drainage.  Do not wash over the wound.  If drainage remains, cover wound with plastic wrap and then shower.   Post-operative opioid taper instructions:   Complete by: As directed    POST-OPERATIVE OPIOID TAPER INSTRUCTIONS: It is important to wean off of your opioid  medication as soon as possible. If you do not need pain medication after your surgery it is ok to stop day one. Opioids include: Codeine, Hydrocodone(Norco, Vicodin), Oxycodone(Percocet, oxycontin) and hydromorphone amongst others.  Long term and even short term use of opiods can cause: Increased pain response Dependence Constipation Depression Respiratory depression And more.  Withdrawal symptoms can include Flu like symptoms Nausea, vomiting And more Techniques to manage these symptoms Hydrate well Eat regular healthy meals Stay active Use relaxation techniques(deep breathing, meditating, yoga) Do Not substitute Alcohol to help with tapering If you have been on opioids for less than two weeks and do not have pain than it is ok to stop all together.  Plan to wean off of opioids This plan should start within one week post op of your joint replacement. Maintain the same interval or time between taking each dose and first decrease the dose.  Cut the total daily intake of opioids by one tablet each day Next start to increase the time between doses. The last dose that should be eliminated is the evening dose.      Weight bearing as tolerated   Complete by: As directed         Follow-up Information     Gean Birchwood, MD. Go on 05/29/2021.   Specialty: Orthopedic Surgery Why: your appointment has been scheduled for 9:00 Contact information: [address] [place] [address] [phone]         [facility] Follow up.   Specialty: Home Health Services Why: HHPT will provide 6 visits at home prior  to you starting your outpatient physical therapy. Contact information: [address] [place] [address] [phone]         [facility], [place]. Go on 05/29/2021.   Why: Your outpatient physical therapy is scheduled for 10:00. Please go over to the therapy side after your MD appointment to complete your paperwork Contact  information: Physical Therapy [address] [place] [address] [phone]                  Signed: Dannielle Burn 05/23/2021, 8:14 AM

## 2021-05-23 NOTE — Plan of Care (Signed)
  Problem: Education: Goal: Knowledge of the prescribed therapeutic regimen will improve Outcome: Progressing   Problem: Pain Management: Goal: Pain level will decrease with appropriate interventions Outcome: Progressing   

## 2021-05-24 DIAGNOSIS — Z7984 Long term (current) use of oral hypoglycemic drugs: Secondary | ICD-10-CM | POA: Diagnosis not present

## 2021-05-24 DIAGNOSIS — Z20822 Contact with and (suspected) exposure to covid-19: Secondary | ICD-10-CM | POA: Diagnosis not present

## 2021-05-24 DIAGNOSIS — E669 Obesity, unspecified: Secondary | ICD-10-CM | POA: Diagnosis not present

## 2021-05-24 DIAGNOSIS — Z96651 Presence of right artificial knee joint: Secondary | ICD-10-CM | POA: Diagnosis not present

## 2021-05-24 DIAGNOSIS — Z79899 Other long term (current) drug therapy: Secondary | ICD-10-CM | POA: Diagnosis not present

## 2021-05-24 DIAGNOSIS — I10 Essential (primary) hypertension: Secondary | ICD-10-CM | POA: Diagnosis not present

## 2021-05-24 DIAGNOSIS — E1169 Type 2 diabetes mellitus with other specified complication: Secondary | ICD-10-CM | POA: Diagnosis not present

## 2021-05-24 DIAGNOSIS — M1712 Unilateral primary osteoarthritis, left knee: Secondary | ICD-10-CM | POA: Diagnosis not present

## 2021-05-24 DIAGNOSIS — Z794 Long term (current) use of insulin: Secondary | ICD-10-CM | POA: Diagnosis not present

## 2021-05-24 LAB — CBC
HCT: 30.5 % — ABNORMAL LOW (ref 36.0–46.0)
Hemoglobin: 9.7 g/dL — ABNORMAL LOW (ref 12.0–15.0)
MCH: 28.5 pg (ref 26.0–34.0)
MCHC: 31.8 g/dL (ref 30.0–36.0)
MCV: 89.7 fL (ref 80.0–100.0)
Platelets: 205 10*3/uL (ref 150–400)
RBC: 3.4 MIL/uL — ABNORMAL LOW (ref 3.87–5.11)
RDW: 15.2 % (ref 11.5–15.5)
WBC: 10.4 10*3/uL (ref 4.0–10.5)
nRBC: 0 % (ref 0.0–0.2)

## 2021-05-24 LAB — GLUCOSE, CAPILLARY
Glucose-Capillary: 120 mg/dL — ABNORMAL HIGH (ref 70–99)
Glucose-Capillary: 101 mg/dL — ABNORMAL HIGH (ref 70–99)
Glucose-Capillary: 114 mg/dL — ABNORMAL HIGH (ref 70–99)

## 2021-05-24 NOTE — Plan of Care (Signed)
  Problem: Activity: Goal: Ability to avoid complications of mobility impairment will improve Outcome: Progressing   Problem: Clinical Measurements: Goal: Postoperative complications will be avoided or minimized Outcome: Progressing   Problem: Pain Management: Goal: Pain level will decrease with appropriate interventions Outcome: Progressing   

## 2021-05-24 NOTE — Discharge Summary (Signed)
Patient ID: [name] MRN: [id number] DOB/AGE: 1945-12-09 75 y.o.  Admit date: 05/21/2021 Discharge date: 05/24/2021  Admission Diagnoses:  Principal Problem:   Degenerative arthritis of left knee Active Problems:   Status post total left knee replacement   Discharge Diagnoses:  Same  Past Medical History:  Diagnosis Date   Arthritis    hands   Bladder infection    Carpal tunnel syndrome    Complication of anesthesia    Difficult to arouse   Diabetes mellitus    GERD (gastroesophageal reflux disease)    Hypertension    Sleep apnea     Surgeries: Procedure(s): LEFT TOTAL KNEE ARTHROPLASTY on 05/21/2021   Consultants:   Discharged Condition: Improved  Hospital Course: [name] is an 75 y.o. female who was admitted 05/21/2021 for operative treatment ofDegenerative arthritis of left knee. Patient has severe unremitting pain that affects sleep, daily activities, and work/hobbies. After pre-op clearance the patient was taken to the operating room on 05/21/2021 and underwent  Procedure(s): LEFT TOTAL KNEE ARTHROPLASTY.    Patient was given perioperative antibiotics:  Anti-infectives (From admission, onward)    Start     Dose/Rate Route Frequency Ordered Stop   05/21/21 0600  ceFAZolin (ANCEF) IVPB 2g/100 mL premix        2 g 200 mL/hr over 30 Minutes Intravenous On call to O.R. 05/21/21 6269 05/21/21 4854        Patient was given sequential compression devices, early ambulation, and chemoprophylaxis to prevent DVT.  Patient benefited maximally from hospital stay and there were no complications.    Recent vital signs: Patient Vitals for the past 24 hrs:  BP Temp Temp src Pulse Resp SpO2  05/24/21 0549 127/62 99 F (37.2 C) Oral 72 16 96 %  05/23/21 2134 131/61 98.9 F (37.2 C) Oral 65 16 99 %  05/23/21 1340 123/60 98.5 F (36.9 C) Oral (!) 56 16 100 %     Recent laboratory studies:  Recent Labs    05/22/21 0328 05/23/21 0336 05/24/21 0304   WBC 11.2* 12.0* 10.4  HGB 10.5* 10.5* 9.7*  HCT 33.7* 35.2* 30.5*  PLT 198 174 205  NA 138  --   --   K 4.8  --   --   CL 108  --   --   CO2 23  --   --   BUN 9  --   --   CREATININE 0.67  --   --   GLUCOSE 137*  --   --   CALCIUM 8.9  --   --      Discharge Medications:   Allergies as of 05/24/2021       Reactions   Pravastatin Hives   Haemophilus Influenzae Vaccines    Nausea, pain, body weakness   Sulfa Antibiotics Itching   Sulfonamide Derivatives Itching   Tetanus Toxoids Swelling   Arm swelled at injection site        Medication List     TAKE these medications    aspirin EC 81 MG tablet Take 1 tablet (81 mg total) by mouth 2 (two) times daily. What changed: when to take this   carvedilol 3.125 MG tablet Commonly known as: COREG carvedilol 3.125 mg tablet  Take 1 tablet twice a day by oral route.   cetirizine 10 MG tablet Commonly known as: ZYRTEC Take 10 mg by mouth daily as needed for allergies.   clotrimazole-betamethasone cream Commonly known as: LOTRISONE Apply topically 2 (two) times  daily as needed. What changed:  how much to take reasons to take this   CoQ10 100 MG Caps Take 100 mg by mouth daily with lunch.   estradiol 0.1 MG/GM vaginal cream Commonly known as: ESTRACE Place 1 Applicatorful vaginally daily as needed.   famotidine 20 MG tablet Commonly known as: PEPCID Take 1 tablet (20 mg total) by mouth 2 (two) times daily.   fluticasone 50 MCG/ACT nasal spray Commonly known as: FLONASE Place 1 spray into both nostrils daily as needed for allergies or rhinitis.   furosemide 20 MG tablet Commonly known as: LASIX Take 1 tablet (20 mg total) by mouth daily as needed (fluid).   hydrochlorothiazide 12.5 MG capsule Commonly known as: MICROZIDE Take 12.5 mg by mouth daily as needed (excess fluid/swelling).   loratadine 10 MG tablet Commonly known as: CLARITIN Take 10 mg by mouth daily as needed for allergies.   losartan 25 MG  tablet Commonly known as: COZAAR Take 1 tablet (25 mg total) by mouth 2 (two) times a week. Take one tablet (25 mg) by mouth twice weekly - Tuesdays and Thursdays   metFORMIN 500 MG tablet Commonly known as: GLUCOPHAGE TAKE 1 TABLET BY MOUTH TWICE DAILY WITH A MEAL   OneTouch Delica Lancets 33G Misc USE   TO CHECK GLUCOSE BEFORE BREAKFAST AND  DINNER   oxyCODONE-acetaminophen 5-325 MG tablet Commonly known as: PERCOCET/ROXICET Take 1 tablet by mouth every 4 (four) hours as needed for severe pain.   OXYGEN Inhale 1 L into the lungs at bedtime as needed (oxygen).   potassium chloride SA 20 MEQ tablet Commonly known as: KLOR-CON M Take 1 tablet (20 mEq total) by mouth every morning.   PROBIOTIC FORMULA PO Take 1 capsule by mouth 2 (two) times a week.   rosuvastatin 20 MG tablet Commonly known as: CRESTOR Take 20 mg by mouth daily.   rosuvastatin 40 MG tablet Commonly known as: CRESTOR Take 1 tablet (40 mg total) by mouth daily.   tiZANidine 2 MG tablet Commonly known as: ZANAFLEX Take 1 tablet (2 mg total) by mouth every 6 (six) hours as needed.   vitamin C 1000 MG tablet Take 1,000 mg by mouth daily.   Vitamin D (Ergocalciferol) 1.25 MG (50000 UNIT) Caps capsule Commonly known as: DRISDOL Take one capsule twice weekly on Tues/Friday What changed:  how much to take how to take this when to take this additional instructions   Vitamin D 50 MCG (2000 UT) tablet Take 2,000 Units by mouth daily.               Durable Medical Equipment  (From admission, onward)           Start     Ordered   05/21/21 1054  DME Walker rolling  Once       Question:  Patient needs a walker to treat with the following condition  Answer:  Status post total left knee replacement   05/21/21 1053   05/21/21 1054  DME 3 n 1  Once        05/21/21 1053              Discharge Care Instructions  (From admission, onward)           Start     Ordered   05/23/21 0000   Weight bearing as tolerated        05/23/21 0814            Diagnostic Studies: DG Chest 2  View  Result Date: 05/09/2021 CLINICAL DATA:  75 year old female under preoperative evaluation. EXAM: CHEST - 2 VIEW COMPARISON:  Chest x-ray 04/13/2019. FINDINGS: Lung volumes are normal. No consolidative airspace disease. No pleural effusions. No pneumothorax. No pulmonary nodule or mass noted. Pulmonary vasculature and the cardiomediastinal silhouette are within normal limits. IMPRESSION: No radiographic evidence of acute cardiopulmonary disease. Electronically Signed   By: Trudie Reed M.D.   On: 05/09/2021 16:41    Disposition: Discharge disposition: 01-Home or Self Care       Discharge Instructions     Call MD / Call 911   Complete by: As directed    If you experience chest pain or shortness of breath, CALL 911 and be transported to the hospital emergency room.  If you develope a fever above 101 F, pus (white drainage) or increased drainage or redness at the wound, or calf pain, call your surgeon's office.   Constipation Prevention   Complete by: As directed    Drink plenty of fluids.  Prune juice may be helpful.  You may use a stool softener, such as Colace (over the counter) 100 mg twice a day.  Use MiraLax (over the counter) for constipation as needed.   Diet - low sodium heart healthy   Complete by: As directed    Driving restrictions   Complete by: As directed    No driving for 2 weeks   Increase activity slowly as tolerated   Complete by: As directed    Patient may shower   Complete by: As directed    You may shower without a dressing once there is no drainage.  Do not wash over the wound.  If drainage remains, cover wound with plastic wrap and then shower.   Post-operative opioid taper instructions:   Complete by: As directed    POST-OPERATIVE OPIOID TAPER INSTRUCTIONS: It is important to wean off of your opioid medication as soon as possible. If you do not need pain  medication after your surgery it is ok to stop day one. Opioids include: Codeine, Hydrocodone(Norco, Vicodin), Oxycodone(Percocet, oxycontin) and hydromorphone amongst others.  Long term and even short term use of opiods can cause: Increased pain response Dependence Constipation Depression Respiratory depression And more.  Withdrawal symptoms can include Flu like symptoms Nausea, vomiting And more Techniques to manage these symptoms Hydrate well Eat regular healthy meals Stay active Use relaxation techniques(deep breathing, meditating, yoga) Do Not substitute Alcohol to help with tapering If you have been on opioids for less than two weeks and do not have pain than it is ok to stop all together.  Plan to wean off of opioids This plan should start within one week post op of your joint replacement. Maintain the same interval or time between taking each dose and first decrease the dose.  Cut the total daily intake of opioids by one tablet each day Next start to increase the time between doses. The last dose that should be eliminated is the evening dose.      Weight bearing as tolerated   Complete by: As directed         Follow-up Information     Gean Birchwood, MD. Go on 05/29/2021.   Specialty: Orthopedic Surgery Why: your appointment has been scheduled for 9:00 Contact information: [address] [place] [address] [phone]         [facility] Follow up.   Specialty: Home Health Services Why: HHPT will provide 6 visits at home prior to you  starting your outpatient physical therapy. Contact information: [address] [place] [address] [phone]         [facility], [place]. Go on 05/29/2021.   Why: Your outpatient physical therapy is scheduled for 10:00. Please go over to the therapy side after your MD appointment to complete your paperwork Contact information: Physical Therapy [address] [place]  [address] [phone]                  Signed: Dannielle Burn 05/24/2021, 8:05 AM

## 2021-05-24 NOTE — Progress Notes (Signed)
Physical Therapy Treatment Patient Details Name: [name] MRN: [id number] DOB: 1945/09/23 Today's Date: 05/24/2021   History of Present Illness Pt is a 75 y.o. femlae s/p Lt TKA on 05/21/21. PMH significant for DM, GERD, HTN, Rt TKA (2020).    PT Comments    POD # 3 am session Pt already OOB in recliner.  First assisted to bathroom.  General transfer comment: pt self able to rise with increased time and effort.  Also assisted on/off raised toilet.  Pt able to self perform including peri care.  Did have increased c/o nausea after.  BP WNL. Assisted with amb in hallway a decreased distance due to increased c/o fatigue.  Recliner following for safety.   Pt will need another PT session today to practice stairs again.   Recommendations for follow up therapy are one component of a multi-disciplinary discharge planning process, led by the attending physician.  Recommendations may be updated based on patient status, additional functional criteria and insurance authorization.  Follow Up Recommendations  Follow physician's recommendations for discharge plan and follow up therapies     Assistance Recommended at Discharge Frequent or constant Supervision/Assistance  Equipment Recommendations  None recommended by PT    Recommendations for Other Services       Precautions / Restrictions Precautions Precautions: Fall Precaution Comments: instructed no pillow under knee Restrictions Weight Bearing Restrictions: No Other Position/Activity Restrictions: WBAT     Mobility  Bed Mobility               General bed mobility comments: OOB in recliner    Transfers Overall transfer level: Needs assistance Equipment used: Rolling walker (2 wheels) Transfers: Sit to/from Stand Sit to Stand: Min guard;Supervision           General transfer comment: pt self able to rise with increased time and effort.  Also assisted on/off raised toilet.  Pt able to self perform including peri  care.  Did have increased c/o nausea after.  BP WNL.    Ambulation/Gait Ambulation/Gait assistance: Supervision;Min guard Gait Distance (Feet): 45 Feet Assistive device: Rolling walker (2 wheels) Gait Pattern/deviations: Step-to pattern;Decreased stride length;Antalgic Gait velocity: decreased     General Gait Details: decreased amb distance this session due to use of bathroom.  Slow, sluggish gait.  No overt buckling or balance deficits.  Apperars to have limited mobility prior.   Stairs     Wheelchair Mobility    Modified Rankin (Stroke Patients Only)       Balance                                            Cognition Arousal/Alertness: Awake/alert Behavior During Therapy: WFL for tasks assessed/performed Overall Cognitive Status: Within Functional Limits for tasks assessed                                 General Comments: AxO x 3 very pleasant lady had her other knee done but was a good 2 years ago        Exercises      General Comments        Pertinent Vitals/Pain Pain Assessment: Faces Pain Score: 7  Pain Location: Lt knee Pain Descriptors / Indicators: Discomfort;Tender;Sore;Operative site guarding Pain Intervention(s): Monitored during session;Repositioned;Ice applied;Premedicated before session    Home Living  Prior Function            PT Goals (current goals can now be found in the care plan section) Progress towards PT goals: Progressing toward goals    Frequency    7X/week      PT Plan Current plan remains appropriate    Co-evaluation              AM-PAC PT "6 Clicks" Mobility   Outcome Measure  Help needed turning from your back to your side while in a flat bed without using bedrails?: A Little Help needed moving from lying on your back to sitting on the side of a flat bed without using bedrails?: A Little Help needed moving to and from a bed to a chair  (including a wheelchair)?: A Little Help needed standing up from a chair using your arms (e.g., wheelchair or bedside chair)?: A Little Help needed to walk in hospital room?: A Little Help needed climbing 3-5 steps with a railing? : A Little 6 Click Score: 18    End of Session Equipment Utilized During Treatment: Gait belt Activity Tolerance: Patient limited by pain;Patient limited by fatigue Patient left: in chair;with chair alarm set;with call bell/phone within reach Nurse Communication: Mobility status PT Visit Diagnosis: Unsteadiness on feet (R26.81);Muscle weakness (generalized) (M62.81);Pain Pain - Right/Left: Left Pain - part of body: Knee     Time:  - 10:30 - 10:57    Charges:    1  gt    1 te                    {Muhanad Torosyan  PTA Acute  Rehabilitation Services Pager      [phone] Office      [phone]

## 2021-05-24 NOTE — Progress Notes (Signed)
PATIENT ID: [name]  MRN: [id number]  DOB/AGE:  76-20-1947 / 76 y.o.  3 Days Post-Op Procedure(s) (LRB): LEFT TOTAL KNEE ARTHROPLASTY (Left)    PROGRESS NOTE Subjective: Patient is alert, oriented, no Nausea, no Vomiting, yes passing gas. Taking PO well. Denies SOB, Chest or Calf Pain. Using Incentive Spirometer, PAS in place. Ambulate WBAT with pt walking 65 ft with therapy, Patient reports pain as 5/10 .    Objective: Vital signs in last 24 hours: Vitals:   05/23/21 0545 05/23/21 1340 05/23/21 2134 05/24/21 0549  BP: 121/62 123/60 131/61 127/62  Pulse: 65 (!) 56 65 72  Resp: 16 16 16 16   Temp: 98.9 F (37.2 C) 98.5 F (36.9 C) 98.9 F (37.2 C) 99 F (37.2 C)  TempSrc: Oral Oral Oral Oral  SpO2: 93% 100% 99% 96%  Weight:      Height:          Intake/Output from previous day: I/O last 3 completed shifts: In: 600 [P.O.:600] Out: -    Intake/Output this shift: No intake/output data recorded.   LABORATORY DATA: Recent Labs    05/22/21 0328 05/22/21 0737 05/23/21 0336 05/23/21 0738 05/23/21 1123 05/23/21 1646 05/23/21 2131 05/24/21 0304  WBC 11.2*  --  12.0*  --   --   --   --  10.4  HGB 10.5*  --  10.5*  --   --   --   --  9.7*  HCT 33.7*  --  35.2*  --   --   --   --  30.5*  PLT 198  --  174  --   --   --   --  205  NA 138  --   --   --   --   --   --   --   K 4.8  --   --   --   --   --   --   --   CL 108  --   --   --   --   --   --   --   CO2 23  --   --   --   --   --   --   --   BUN 9  --   --   --   --   --   --   --   CREATININE 0.67  --   --   --   --   --   --   --   GLUCOSE 137*  --   --   --   --   --   --   --   GLUCAP  --    < >  --    < > 136* 101* 143*  --   CALCIUM 8.9  --   --   --   --   --   --   --    < > = values in this interval not displayed.    Examination: Neurologically intact Neurovascular intact Sensation intact distally Intact pulses distally Dorsiflexion/Plantar flexion intact Incision: dressing C/D/I No  cellulitis present Compartment soft}  Assessment:   3 Days Post-Op Procedure(s) (LRB): LEFT TOTAL KNEE ARTHROPLASTY (Left) ADDITIONAL DIAGNOSIS: Expected Acute Blood Loss Anemia, History of sleep apnea, diabetes type 2, hyperlipidemia, hypertension, obesity. Anticipated LOS equal to or greater than 2 midnights due to - Age 52 and older with one or more of the following:  - Obesity  - Expected  need for hospital services (PT, OT, Nursing) required for safe  discharge  - Anticipated need for postoperative skilled nursing care or inpatient rehab  - Active co-morbidities: Diabetes and Anemia OR   - Unanticipated findings during/Post Surgery: Slow post-op progression: GI, pain control, mobility    Plan: PT/OT WBAT, AROM and PROM  DVT Prophylaxis:  SCDx72hrs, ASA 81 mg BID x 2 weeks DISCHARGE PLAN: Home when pt meets therapy goals.  Likely today. DISCHARGE NEEDS: HHPT, Walker, and 3-in-1 comode seat     Dannielle Burn 05/24/2021, 8:00 AM

## 2021-05-24 NOTE — Progress Notes (Signed)
Physical Therapy Treatment Patient Details Name: [name] MRN: [id number] DOB: [birth year] Today's Date: 05/24/2021   History of Present Illness Pt is a 75 y.o. femlae s/p Lt TKA on 05/21/21. PMH significant for DM, GERD, HTN, Rt TKA (2020).    PT Comments    POD # 3 pm session Pt in bed resting but easily aroused.  Assisted OOB to amb in hallway and practice stairs as pt is scheduled to D/C to home today.  No family has been in room either PT visit.  General stair comments: used 2 hands on one rail this session for increased support.  Slow and sluggish but able.  50% VC's on proper sequencing. Assisted back to bed per pt request to rest.  Pt stated her daughter is working and would not be able to get her till later.  Reported to RN, pt HAS met her mobility goals to D/C to home today.     Recommendations for follow up therapy are one component of a multi-disciplinary discharge planning process, led by the attending physician.  Recommendations may be updated based on patient status, additional functional criteria and insurance authorization.  Follow Up Recommendations  Follow physician's recommendations for discharge plan and follow up therapies     Assistance Recommended at Discharge Frequent or constant Supervision/Assistance  Equipment Recommendations  None recommended by PT    Recommendations for Other Services       Precautions / Restrictions Precautions Precautions: Fall Precaution Comments: instructed no pillow under knee Restrictions Weight Bearing Restrictions: No Other Position/Activity Restrictions: WBAT     Mobility  Bed Mobility Overal bed mobility: Needs Assistance Bed Mobility: Supine to Sit;Sit to Supine     Supine to sit: Supervision Sit to supine: Supervision   General bed mobility comments: pt was able to self assist LE OOB with increased time and use of rail for upper body.  Slow.  Then pt was self able to lift LE up onto bed to return to bed to  rest.    Transfers Overall transfer level: Needs assistance Equipment used: Rolling walker (2 wheels) Transfers: Sit to/from Stand Sit to Stand: Min guard;Supervision           General transfer comment: pt was self able to rise from bed with increased time.  25% VC's to extended L LE prior to sit.    Ambulation/Gait Ambulation/Gait assistance: Supervision Gait Distance (Feet): 28 Feet Assistive device: Rolling walker (2 wheels) Gait Pattern/deviations: Step-to pattern;Decreased stride length;Antalgic Gait velocity: decreased     General Gait Details: decreased amb distance this session due practicing stairs.  Slow, sluggish gait.  No overt buckling or balance deficits.  Apperars to have limited mobility prior.   Stairs   Stairs assistance: Min guard;Min assist Stair Management: One rail Right;Forwards Number of Stairs: 2 General stair comments: used 2 hands on one rail this session for increased support.  Slow and sluggish but able.  50% VC's on proper sequencing.   Wheelchair Mobility    Modified Rankin (Stroke Patients Only)       Balance                                            Cognition Arousal/Alertness: Awake/alert Behavior During Therapy: WFL for tasks assessed/performed Overall Cognitive Status: Within Functional Limits for tasks assessed  General Comments: AxO x 3 very pleasant lady had her other knee done but was a good 2 years ago        Exercises      General Comments        Pertinent Vitals/Pain Pain Assessment: Faces Pain Score: 7  Pain Location: Lt knee Pain Descriptors / Indicators: Discomfort;Tender;Sore;Operative site guarding Pain Intervention(s): Monitored during session;Repositioned;Ice applied;Premedicated before session    Home Living                          Prior Function            PT Goals (current goals can now be found in the care plan  section) Progress towards PT goals: Progressing toward goals    Frequency    7X/week      PT Plan Current plan remains appropriate    Co-evaluation              AM-PAC PT "6 Clicks" Mobility   Outcome Measure  Help needed turning from your back to your side while in a flat bed without using bedrails?: A Little Help needed moving from lying on your back to sitting on the side of a flat bed without using bedrails?: A Little Help needed moving to and from a bed to a chair (including a wheelchair)?: A Little Help needed standing up from a chair using your arms (e.g., wheelchair or bedside chair)?: A Little Help needed to walk in hospital room?: A Little Help needed climbing 3-5 steps with a railing? : A Little 6 Click Score: 18    End of Session Equipment Utilized During Treatment: Gait belt Activity Tolerance: Patient limited by pain;Patient limited by fatigue Patient left: in bed Nurse Communication: Mobility status PT Visit Diagnosis: Unsteadiness on feet (R26.81);Muscle weakness (generalized) (M62.81);Pain Pain - Right/Left: Left Pain - part of body: Knee     Time: 1345-1410 PT Time Calculation (min) (ACUTE ONLY): 25 min  Charges:  $Gait Training: 8-22 mins $Therapeutic Activity: 8-22 mins                     Rica Koyanagi  PTA Acute  Rehabilitation Services Pager      [phone] Office      [phone]

## 2021-05-24 NOTE — Plan of Care (Signed)
Discharge instructions given to the pt. 

## 2021-05-25 ENCOUNTER — Telehealth: Payer: Self-pay

## 2021-05-25 DIAGNOSIS — E119 Type 2 diabetes mellitus without complications: Secondary | ICD-10-CM | POA: Diagnosis not present

## 2021-05-25 DIAGNOSIS — Z471 Aftercare following joint replacement surgery: Secondary | ICD-10-CM | POA: Diagnosis not present

## 2021-05-25 DIAGNOSIS — G4733 Obstructive sleep apnea (adult) (pediatric): Secondary | ICD-10-CM | POA: Diagnosis not present

## 2021-05-25 DIAGNOSIS — G5603 Carpal tunnel syndrome, bilateral upper limbs: Secondary | ICD-10-CM | POA: Diagnosis not present

## 2021-05-25 DIAGNOSIS — Z6838 Body mass index (BMI) 38.0-38.9, adult: Secondary | ICD-10-CM | POA: Diagnosis not present

## 2021-05-25 DIAGNOSIS — E669 Obesity, unspecified: Secondary | ICD-10-CM | POA: Diagnosis not present

## 2021-05-25 DIAGNOSIS — I493 Ventricular premature depolarization: Secondary | ICD-10-CM | POA: Diagnosis not present

## 2021-05-25 DIAGNOSIS — Z96653 Presence of artificial knee joint, bilateral: Secondary | ICD-10-CM | POA: Diagnosis not present

## 2021-05-25 DIAGNOSIS — K219 Gastro-esophageal reflux disease without esophagitis: Secondary | ICD-10-CM | POA: Diagnosis not present

## 2021-05-25 DIAGNOSIS — I10 Essential (primary) hypertension: Secondary | ICD-10-CM | POA: Diagnosis not present

## 2021-05-25 NOTE — Telephone Encounter (Signed)
Transition Care Management Follow-up Telephone Call Date of discharge and from where: 05/24/2021 [facility]  How have you been since you were released from the hospital? Pt states she is hanging in there, pain is better than what it was.  Any questions or concerns? No  Items Reviewed: Did the pt receive and understand the discharge instructions provided? Yes  Medications obtained and verified? Yes  Other? Yes  Any new allergies since your discharge? No  Dietary orders reviewed? Yes Do you have support at home? Yes   Home Care and Equipment/Supplies: Were home health services ordered? yes If so, what is the name of the agency? Pt does not recall name.   Has the agency set up a time to come to the patient's home? yes Were any new equipment or medical supplies ordered?  No What is the name of the medical supply agency? N/a Were you able to get the supplies/equipment? not applicable Do you have any questions related to the use of the equipment or supplies? No  Functional Questionnaire: (I = Independent and D = Dependent) ADLs: I/d  Bathing/Dressing- I/d  Meal Prep- I/d  Eating- I/d  Maintaining continence- I/d  Transferring/Ambulation- I/d  Managing Meds- I/d  Follow up appointments reviewed:  PCP Hospital f/u appt confirmed? Yes  Scheduled to see Cathrine Muster on 05/31/2021 @ 4:00 [facility] . Specialist Hospital f/u appt confirmed? No  Scheduled to see n/a on n/a @ n/a. Are transportation arrangements needed? Yes  If their condition worsens, is the pt aware to call PCP or go to the Emergency Dept.? Yes Was the patient provided with contact information for the PCP's office or ED? Yes Was to pt encouraged to call back with questions or concerns? Yes

## 2021-05-26 DIAGNOSIS — I493 Ventricular premature depolarization: Secondary | ICD-10-CM | POA: Diagnosis not present

## 2021-05-26 DIAGNOSIS — Z6838 Body mass index (BMI) 38.0-38.9, adult: Secondary | ICD-10-CM | POA: Diagnosis not present

## 2021-05-26 DIAGNOSIS — G4733 Obstructive sleep apnea (adult) (pediatric): Secondary | ICD-10-CM | POA: Diagnosis not present

## 2021-05-26 DIAGNOSIS — Z471 Aftercare following joint replacement surgery: Secondary | ICD-10-CM | POA: Diagnosis not present

## 2021-05-26 DIAGNOSIS — E119 Type 2 diabetes mellitus without complications: Secondary | ICD-10-CM | POA: Diagnosis not present

## 2021-05-26 DIAGNOSIS — Z96653 Presence of artificial knee joint, bilateral: Secondary | ICD-10-CM | POA: Diagnosis not present

## 2021-05-26 DIAGNOSIS — G5603 Carpal tunnel syndrome, bilateral upper limbs: Secondary | ICD-10-CM | POA: Diagnosis not present

## 2021-05-26 DIAGNOSIS — K219 Gastro-esophageal reflux disease without esophagitis: Secondary | ICD-10-CM | POA: Diagnosis not present

## 2021-05-26 DIAGNOSIS — E669 Obesity, unspecified: Secondary | ICD-10-CM | POA: Diagnosis not present

## 2021-05-26 DIAGNOSIS — I10 Essential (primary) hypertension: Secondary | ICD-10-CM | POA: Diagnosis not present

## 2021-05-28 DIAGNOSIS — Z96653 Presence of artificial knee joint, bilateral: Secondary | ICD-10-CM | POA: Diagnosis not present

## 2021-05-28 DIAGNOSIS — G5603 Carpal tunnel syndrome, bilateral upper limbs: Secondary | ICD-10-CM | POA: Diagnosis not present

## 2021-05-28 DIAGNOSIS — E119 Type 2 diabetes mellitus without complications: Secondary | ICD-10-CM | POA: Diagnosis not present

## 2021-05-28 DIAGNOSIS — Z6838 Body mass index (BMI) 38.0-38.9, adult: Secondary | ICD-10-CM | POA: Diagnosis not present

## 2021-05-28 DIAGNOSIS — G4733 Obstructive sleep apnea (adult) (pediatric): Secondary | ICD-10-CM | POA: Diagnosis not present

## 2021-05-28 DIAGNOSIS — I10 Essential (primary) hypertension: Secondary | ICD-10-CM | POA: Diagnosis not present

## 2021-05-28 DIAGNOSIS — K219 Gastro-esophageal reflux disease without esophagitis: Secondary | ICD-10-CM | POA: Diagnosis not present

## 2021-05-28 DIAGNOSIS — E669 Obesity, unspecified: Secondary | ICD-10-CM | POA: Diagnosis not present

## 2021-05-28 DIAGNOSIS — Z471 Aftercare following joint replacement surgery: Secondary | ICD-10-CM | POA: Diagnosis not present

## 2021-05-28 DIAGNOSIS — I493 Ventricular premature depolarization: Secondary | ICD-10-CM | POA: Diagnosis not present

## 2021-05-29 DIAGNOSIS — M1712 Unilateral primary osteoarthritis, left knee: Secondary | ICD-10-CM | POA: Diagnosis not present

## 2021-05-30 DIAGNOSIS — E669 Obesity, unspecified: Secondary | ICD-10-CM | POA: Diagnosis not present

## 2021-05-30 DIAGNOSIS — Z471 Aftercare following joint replacement surgery: Secondary | ICD-10-CM | POA: Diagnosis not present

## 2021-05-30 DIAGNOSIS — K219 Gastro-esophageal reflux disease without esophagitis: Secondary | ICD-10-CM | POA: Diagnosis not present

## 2021-05-30 DIAGNOSIS — I10 Essential (primary) hypertension: Secondary | ICD-10-CM | POA: Diagnosis not present

## 2021-05-30 DIAGNOSIS — G5603 Carpal tunnel syndrome, bilateral upper limbs: Secondary | ICD-10-CM | POA: Diagnosis not present

## 2021-05-30 DIAGNOSIS — G4733 Obstructive sleep apnea (adult) (pediatric): Secondary | ICD-10-CM | POA: Diagnosis not present

## 2021-05-30 DIAGNOSIS — Z6838 Body mass index (BMI) 38.0-38.9, adult: Secondary | ICD-10-CM | POA: Diagnosis not present

## 2021-05-30 DIAGNOSIS — Z96653 Presence of artificial knee joint, bilateral: Secondary | ICD-10-CM | POA: Diagnosis not present

## 2021-05-30 DIAGNOSIS — E119 Type 2 diabetes mellitus without complications: Secondary | ICD-10-CM | POA: Diagnosis not present

## 2021-05-30 DIAGNOSIS — I493 Ventricular premature depolarization: Secondary | ICD-10-CM | POA: Diagnosis not present

## 2021-05-31 ENCOUNTER — Telehealth (INDEPENDENT_AMBULATORY_CARE_PROVIDER_SITE_OTHER): Payer: Medicare HMO | Admitting: Nurse Practitioner

## 2021-05-31 ENCOUNTER — Encounter: Payer: Self-pay | Admitting: Nurse Practitioner

## 2021-05-31 ENCOUNTER — Other Ambulatory Visit: Payer: Self-pay

## 2021-05-31 DIAGNOSIS — E1165 Type 2 diabetes mellitus with hyperglycemia: Secondary | ICD-10-CM

## 2021-05-31 DIAGNOSIS — I119 Hypertensive heart disease without heart failure: Secondary | ICD-10-CM

## 2021-05-31 DIAGNOSIS — Z96653 Presence of artificial knee joint, bilateral: Secondary | ICD-10-CM | POA: Diagnosis not present

## 2021-05-31 DIAGNOSIS — D508 Other iron deficiency anemias: Secondary | ICD-10-CM

## 2021-05-31 NOTE — Patient Instructions (Signed)

## 2021-05-31 NOTE — Progress Notes (Signed)
Virtual Visit via phone due to failed video visit.    This visit type was conducted due to national recommendations for restrictions regarding the COVID-19 Pandemic (e.g. social distancing) in an effort to limit this patient's exposure and mitigate transmission in our community.  Due to her co-morbid illnesses, this patient is at least at moderate risk for complications without adequate follow up.  This format is felt to be most appropriate for this patient at this time.  All issues noted in this document were discussed and addressed.  A limited physical exam was performed with this format.    This visit type was conducted due to national recommendations for restrictions regarding the COVID-19 Pandemic (e.g. social distancing) in an effort to limit this patient's exposure and mitigate transmission in our community.  Patients identity confirmed using two different identifiers.  This format is felt to be most appropriate for this patient at this time.  All issues noted in this document were discussed and addressed.  No physical exam was performed (except for noted visual exam findings with Video Visits).    Date:  05/31/2021   ID:  [name], DOB Aug 31, 1945, MRN [id number]  Patient Location:  Home   Provider location:   Office  Chief Complaint:  Diabetes and HTN check   History of Present Illness:    [name] is a 75 y.o. female who presents via video conferencing for a telepath phone visit today.    The patient does not have symptoms concerning for COVID-19 infection (fever, chills, cough, or new shortness of breath).   Patient presents today for dm and htn check. She had a left total knee replacement. She is going to follow with the orthopedic on the 16th of this month. She takes her blood sugar at home. She runs in the 90's. She takes her all of her meds. She recently had blood work. We reviewed her blood work together on the phone. No concerns or questions.  Diet: She is  trying to eat healthy.  She is doing home therapy. She does 3 times a week.  She is taking the pain medication for the pain.   Hypertension This is a chronic problem. The current episode started more than 1 year ago. The problem has been gradually improving since onset. The problem is controlled. Pertinent negatives include no blurred vision, chest pain, headaches, palpitations or shortness of breath. Risk factors for coronary artery disease include diabetes mellitus, dyslipidemia, obesity, post-menopausal state and sedentary lifestyle. The current treatment provides moderate improvement. Compliance problems include exercise.   Diabetes She presents for her follow-up diabetic visit. She has type 2 diabetes mellitus. Her disease course has been stable. There are no hypoglycemic associated symptoms. Pertinent negatives for hypoglycemia include no headaches. Tremors: .Pertinent negatives for diabetes include no blurred vision and no chest pain. There are no hypoglycemic complications. Risk factors for coronary artery disease include dyslipidemia, diabetes mellitus, hypertension, obesity, post-menopausal and sedentary lifestyle. She is following a diabetic diet. She participates in exercise intermittently. Her breakfast blood glucose is taken between 8-9 am. Her breakfast blood glucose range is generally 110-130 mg/dl. An ACE inhibitor/angiotensin II receptor blocker is being taken. Eye exam is current.    Past Medical History:  Diagnosis Date   Arthritis    hands   Bladder infection    Carpal tunnel syndrome    Complication of anesthesia    Difficult to arouse   Diabetes mellitus    GERD (gastroesophageal reflux disease)  Hypertension    Sleep apnea    Past Surgical History:  Procedure Laterality Date   ABDOMINAL HYSTERECTOMY  1985   partial   APPENDECTOMY     age 50   CARPAL TUNNEL RELEASE Bilateral 1980   cataract surgery Right 07/07/2020   Dr. Dione Booze   cataract surgery Left 12/2020    HERNIA REPAIR  1990   TONSILLECTOMY     as a child   TOTAL KNEE ARTHROPLASTY Right 04/19/2019   Procedure: RIGHT TOTAL KNEE ARTHROPLASTY;  Surgeon: Gean Birchwood, MD;  Location: [facility];  Service: Orthopedics;  Laterality: Right;   TOTAL KNEE ARTHROPLASTY Left 05/21/2021   Procedure: LEFT TOTAL KNEE ARTHROPLASTY;  Surgeon: Gean Birchwood, MD;  Location: [facility];  Service: Orthopedics;  Laterality: Left;     Current Meds  Medication Sig   Ascorbic Acid (VITAMIN C) 1000 MG tablet Take 1,000 mg by mouth daily.   aspirin EC 81 MG tablet Take 1 tablet (81 mg total) by mouth 2 (two) times daily.   Bacillus Coagulans-Inulin (PROBIOTIC FORMULA PO) Take 1 capsule by mouth 2 (two) times a week.   carvedilol (COREG) 3.125 MG tablet carvedilol 3.125 mg tablet  Take 1 tablet twice a day by oral route.   cetirizine (ZYRTEC) 10 MG tablet Take 10 mg by mouth daily as needed for allergies.   Cholecalciferol (VITAMIN D) 50 MCG (2000 UT) tablet Take 2,000 Units by mouth daily.   clotrimazole-betamethasone (LOTRISONE) cream Apply topically 2 (two) times daily as needed. (Patient taking differently: Apply 1 application topically 2 (two) times daily as needed (eczema).)   Coenzyme Q10 (COQ10) 100 MG CAPS Take 100 mg by mouth daily with lunch.    estradiol (ESTRACE) 0.1 MG/GM vaginal cream Place 1 Applicatorful vaginally daily as needed.   famotidine (PEPCID) 20 MG tablet Take 1 tablet (20 mg total) by mouth 2 (two) times daily.   fluticasone (FLONASE) 50 MCG/ACT nasal spray Place 1 spray into both nostrils daily as needed for allergies or rhinitis.   furosemide (LASIX) 20 MG tablet Take 1 tablet (20 mg total) by mouth daily as needed (fluid).   hydrochlorothiazide (MICROZIDE) 12.5 MG capsule Take 12.5 mg by mouth daily as needed (excess fluid/swelling).    loratadine (CLARITIN) 10 MG tablet Take 10 mg by mouth daily as needed for allergies.   losartan (COZAAR) 25 MG tablet Take 1 tablet (25 mg total) by mouth 2 (two)  times a week. Take one tablet (25 mg) by mouth twice weekly - Tuesdays and Thursdays   metFORMIN (GLUCOPHAGE) 500 MG tablet TAKE 1 TABLET BY MOUTH TWICE DAILY WITH A MEAL   OneTouch Delica Lancets 33G MISC USE   TO CHECK GLUCOSE BEFORE BREAKFAST AND  DINNER   oxyCODONE-acetaminophen (PERCOCET/ROXICET) 5-325 MG tablet Take 1 tablet by mouth every 4 (four) hours as needed for severe pain.   OXYGEN Inhale 1 L into the lungs at bedtime as needed (oxygen).   potassium chloride SA (KLOR-CON) 20 MEQ tablet Take 1 tablet (20 mEq total) by mouth every morning.   rosuvastatin (CRESTOR) 20 MG tablet Take 20 mg by mouth daily.   tiZANidine (ZANAFLEX) 2 MG tablet Take 1 tablet (2 mg total) by mouth every 6 (six) hours as needed.   Vitamin D, Ergocalciferol, (DRISDOL) 1.25 MG (50000 UNIT) CAPS capsule Take one capsule twice weekly on Tues/Friday (Patient taking differently: Take 50,000 Units by mouth every Friday.)   [DISCONTINUED] rosuvastatin (CRESTOR) 40 MG tablet Take 1 tablet (40 mg total) by  mouth daily.     Allergies:   Pravastatin, Haemophilus influenzae vaccines, Sulfa antibiotics, Sulfonamide derivatives, and Tetanus toxoids   Social History   Tobacco Use   Smoking status: Never   Smokeless tobacco: Never  Vaping Use   Vaping Use: Never used  Substance Use Topics   Alcohol use: No   Drug use: No     Family Hx: The patient's family history includes Breast cancer (age of onset: 62) in her sister; Diabetes in her brother, brother, father, and mother; Heart attack (age of onset: 69) in her father; Hypertension in her mother; Stroke (age of onset: 76) in her father.  ROS:   Please see the history of present illness.    Review of Systems  Constitutional: Negative.   Eyes:  Negative for blurred vision.  Respiratory: Negative.  Negative for cough and shortness of breath.   Cardiovascular: Negative.  Negative for chest pain and palpitations.  Gastrointestinal: Negative.  Negative for  constipation and diarrhea.  Musculoskeletal:  Negative for myalgias.  Neurological: Negative.  Negative for headaches. Tremors: . Psychiatric/Behavioral: Negative.     All other systems reviewed and are negative.   Labs/Other Tests and Data Reviewed:    Recent Labs: 03/06/2021: ALT 11 05/22/2021: BUN 9; Creatinine, Ser 0.67; Potassium 4.8; Sodium 138 05/24/2021: Hemoglobin 9.7; Platelets 205   Recent Lipid Panel Lab Results  Component Value Date/Time   CHOL 176 03/06/2021 11:43 AM   TRIG 79 03/06/2021 11:43 AM   HDL 70 03/06/2021 11:43 AM   CHOLHDL 2.5 03/06/2021 11:43 AM   CHOLHDL 3.3 05/29/2016 06:22 AM   LDLCALC 91 03/06/2021 11:43 AM    Wt Readings from Last 3 Encounters:  05/21/21 212 lb (96.2 kg)  05/09/21 212 lb (96.2 kg)  04/02/21 212 lb 3.2 oz (96.3 kg)     Exam:    Vital Signs:  There were no vitals taken for this visit.    Physical Exam  ASSESSMENT & PLAN:    1. Uncontrolled type 2 diabetes mellitus with hyperglycemia (HCC) -Chronic, reviewed recent Hgb A1c  -Continue taking meds  --Discussed with patient the importance of glycemic control and long term complications from uncontrolled diabetes. Discussed with the patient the importance of compliance with home glucose monitoring, diet which includes decrease amount of sugary drinks and foods. Importance of exercise was also discussed with the patient. Importance of eye exams, self foot care and compliance to office visits was also discussed with the patient.  -Follow up in 3 months   2. Hypertensive heart disease without heart failure -Chronic, continue meds  -Continue taking meds. Did not make any changes to meds  -Reviewed labs with her   3. Other iron deficiency anemia -Will check labs; patient to come into the office when she is able to for labs due to low hgb after her recent total knee replacement.  - CBC no Diff; Future     COVID-19 Education: The signs and symptoms of COVID-19 were discussed  with the patient and how to seek care for testing (follow up with PCP or arrange E-visit).  The importance of social distancing was discussed today.  Patient Risk:   After full review of this patients clinical status, I feel that they are at least moderate risk at this time.  Time:   Today, I have spent 15 minutes/ seconds with the patient with telehealth technology discussing above diagnoses.     Medication Adjustments/Labs and Tests Ordered: Current medicines are reviewed at length with the  patient today.  Concerns regarding medicines are outlined above.   Tests Ordered: Orders Placed This Encounter  Procedures   CBC no Diff     Medication Changes: No orders of the defined types were placed in this encounter.   Disposition:  Follow up 3 months.   Signed, Charlesetta Ivory, NP

## 2021-06-01 DIAGNOSIS — Z6838 Body mass index (BMI) 38.0-38.9, adult: Secondary | ICD-10-CM | POA: Diagnosis not present

## 2021-06-01 DIAGNOSIS — Z471 Aftercare following joint replacement surgery: Secondary | ICD-10-CM | POA: Diagnosis not present

## 2021-06-01 DIAGNOSIS — K219 Gastro-esophageal reflux disease without esophagitis: Secondary | ICD-10-CM | POA: Diagnosis not present

## 2021-06-01 DIAGNOSIS — G5603 Carpal tunnel syndrome, bilateral upper limbs: Secondary | ICD-10-CM | POA: Diagnosis not present

## 2021-06-01 DIAGNOSIS — Z96653 Presence of artificial knee joint, bilateral: Secondary | ICD-10-CM | POA: Diagnosis not present

## 2021-06-01 DIAGNOSIS — I10 Essential (primary) hypertension: Secondary | ICD-10-CM | POA: Diagnosis not present

## 2021-06-01 DIAGNOSIS — G4733 Obstructive sleep apnea (adult) (pediatric): Secondary | ICD-10-CM | POA: Diagnosis not present

## 2021-06-01 DIAGNOSIS — I493 Ventricular premature depolarization: Secondary | ICD-10-CM | POA: Diagnosis not present

## 2021-06-01 DIAGNOSIS — E119 Type 2 diabetes mellitus without complications: Secondary | ICD-10-CM | POA: Diagnosis not present

## 2021-06-01 DIAGNOSIS — E669 Obesity, unspecified: Secondary | ICD-10-CM | POA: Diagnosis not present

## 2021-06-03 DIAGNOSIS — I10 Essential (primary) hypertension: Secondary | ICD-10-CM | POA: Diagnosis not present

## 2021-06-03 DIAGNOSIS — R0600 Dyspnea, unspecified: Secondary | ICD-10-CM | POA: Diagnosis not present

## 2021-06-03 DIAGNOSIS — R0602 Shortness of breath: Secondary | ICD-10-CM | POA: Diagnosis not present

## 2021-06-03 DIAGNOSIS — G4733 Obstructive sleep apnea (adult) (pediatric): Secondary | ICD-10-CM | POA: Diagnosis not present

## 2021-06-04 DIAGNOSIS — I493 Ventricular premature depolarization: Secondary | ICD-10-CM | POA: Diagnosis not present

## 2021-06-04 DIAGNOSIS — E119 Type 2 diabetes mellitus without complications: Secondary | ICD-10-CM | POA: Diagnosis not present

## 2021-06-04 DIAGNOSIS — Z6838 Body mass index (BMI) 38.0-38.9, adult: Secondary | ICD-10-CM | POA: Diagnosis not present

## 2021-06-04 DIAGNOSIS — G4733 Obstructive sleep apnea (adult) (pediatric): Secondary | ICD-10-CM | POA: Diagnosis not present

## 2021-06-04 DIAGNOSIS — E669 Obesity, unspecified: Secondary | ICD-10-CM | POA: Diagnosis not present

## 2021-06-04 DIAGNOSIS — Z471 Aftercare following joint replacement surgery: Secondary | ICD-10-CM | POA: Diagnosis not present

## 2021-06-04 DIAGNOSIS — G5603 Carpal tunnel syndrome, bilateral upper limbs: Secondary | ICD-10-CM | POA: Diagnosis not present

## 2021-06-04 DIAGNOSIS — I10 Essential (primary) hypertension: Secondary | ICD-10-CM | POA: Diagnosis not present

## 2021-06-04 DIAGNOSIS — Z96653 Presence of artificial knee joint, bilateral: Secondary | ICD-10-CM | POA: Diagnosis not present

## 2021-06-04 DIAGNOSIS — K219 Gastro-esophageal reflux disease without esophagitis: Secondary | ICD-10-CM | POA: Diagnosis not present

## 2021-06-06 ENCOUNTER — Other Ambulatory Visit: Payer: Self-pay | Admitting: Cardiology

## 2021-06-06 DIAGNOSIS — G4733 Obstructive sleep apnea (adult) (pediatric): Secondary | ICD-10-CM | POA: Diagnosis not present

## 2021-06-06 DIAGNOSIS — Z471 Aftercare following joint replacement surgery: Secondary | ICD-10-CM | POA: Diagnosis not present

## 2021-06-06 DIAGNOSIS — Z6838 Body mass index (BMI) 38.0-38.9, adult: Secondary | ICD-10-CM | POA: Diagnosis not present

## 2021-06-06 DIAGNOSIS — I10 Essential (primary) hypertension: Secondary | ICD-10-CM | POA: Diagnosis not present

## 2021-06-06 DIAGNOSIS — I493 Ventricular premature depolarization: Secondary | ICD-10-CM | POA: Diagnosis not present

## 2021-06-06 DIAGNOSIS — Z96653 Presence of artificial knee joint, bilateral: Secondary | ICD-10-CM | POA: Diagnosis not present

## 2021-06-06 DIAGNOSIS — E669 Obesity, unspecified: Secondary | ICD-10-CM | POA: Diagnosis not present

## 2021-06-06 DIAGNOSIS — G5603 Carpal tunnel syndrome, bilateral upper limbs: Secondary | ICD-10-CM | POA: Diagnosis not present

## 2021-06-06 DIAGNOSIS — E119 Type 2 diabetes mellitus without complications: Secondary | ICD-10-CM | POA: Diagnosis not present

## 2021-06-06 DIAGNOSIS — K219 Gastro-esophageal reflux disease without esophagitis: Secondary | ICD-10-CM | POA: Diagnosis not present

## 2021-06-08 DIAGNOSIS — M25662 Stiffness of left knee, not elsewhere classified: Secondary | ICD-10-CM | POA: Diagnosis not present

## 2021-06-08 DIAGNOSIS — Z96652 Presence of left artificial knee joint: Secondary | ICD-10-CM | POA: Diagnosis not present

## 2021-06-08 DIAGNOSIS — M6281 Muscle weakness (generalized): Secondary | ICD-10-CM | POA: Diagnosis not present

## 2021-06-13 ENCOUNTER — Ambulatory Visit: Payer: Medicare HMO

## 2021-06-13 ENCOUNTER — Telehealth: Payer: Self-pay

## 2021-06-13 DIAGNOSIS — Z96652 Presence of left artificial knee joint: Secondary | ICD-10-CM | POA: Diagnosis not present

## 2021-06-13 DIAGNOSIS — M25662 Stiffness of left knee, not elsewhere classified: Secondary | ICD-10-CM | POA: Diagnosis not present

## 2021-06-13 DIAGNOSIS — M6281 Muscle weakness (generalized): Secondary | ICD-10-CM | POA: Diagnosis not present

## 2021-06-13 NOTE — Telephone Encounter (Signed)
This nurse called patient in regards to missed AWV. She stated that she was told it was over the phone. Rescheduled to tomorrow at 3:15

## 2021-06-14 ENCOUNTER — Ambulatory Visit (INDEPENDENT_AMBULATORY_CARE_PROVIDER_SITE_OTHER): Payer: Medicare HMO

## 2021-06-14 VITALS — Ht 64.0 in | Wt 215.0 lb

## 2021-06-14 DIAGNOSIS — Z0000 Encounter for general adult medical examination without abnormal findings: Secondary | ICD-10-CM

## 2021-06-14 NOTE — Progress Notes (Signed)
I connected with [name] today by telephone and verified that I am speaking with the correct person using two identifiers. Location patient: home Location provider: work Persons participating in the virtual visit: [name], Angelica Ponder LPN.   I discussed the limitations, risks, security and privacy concerns of performing an evaluation and management service by telephone and the availability of in person appointments. I also discussed with the patient that there may be a patient responsible charge related to this service. The patient expressed understanding and verbally consented to this telephonic visit.    Interactive audio and video telecommunications were attempted between this provider and patient, however failed, due to patient having technical difficulties OR patient did not have access to video capability.  We continued and completed visit with audio only.     Vital signs may be patient reported or missing.  Subjective:   [name] is a 75 y.o. female who presents for Medicare Annual (Subsequent) preventive examination.  Review of Systems     Cardiac Risk Factors include: advanced age (>10men, >89 women);diabetes mellitus;dyslipidemia;hypertension;obesity (BMI >30kg/m2)     Objective:    Today's Vitals   06/14/21 1446  Weight: 215 lb (97.5 kg)  Height: 5\' 4"  (1.626 m)   Body mass index is 36.9 kg/m.  Advanced Directives 06/14/2021 05/21/2021 05/09/2021 06/08/2020 06/09/2019 04/20/2019 04/19/2019  Does Patient Have a Medical Advance Directive? Yes Yes Yes Yes Yes No No  Type of 04/21/2019 of [place];Living will Healthcare Power of [place];Living will Healthcare Power of [place];Living will Healthcare Power of [place];Living will Healthcare Power of [place];Living will - -  Does patient want to make changes to medical advance directive? - No - Patient declined - - - - -  Copy of Healthcare Power of Attorney in Chart? No -  copy requested No - copy requested - No - copy requested No - copy requested - -  Would patient like information on creating a medical advance directive? - - - - - No - Patient declined No - Patient declined    Current Medications (verified) Outpatient Encounter Medications as of 06/14/2021  Medication Sig   Ascorbic Acid (VITAMIN C) 1000 MG tablet Take 1,000 mg by mouth daily.   aspirin EC 81 MG tablet Take 1 tablet (81 mg total) by mouth 2 (two) times daily.   Bacillus Coagulans-Inulin (PROBIOTIC FORMULA PO) Take 1 capsule by mouth 2 (two) times a week.   carvedilol (COREG) 3.125 MG tablet Take 1 tablet by mouth twice daily   cetirizine (ZYRTEC) 10 MG tablet Take 10 mg by mouth daily as needed for allergies.   Cholecalciferol (VITAMIN D) 50 MCG (2000 UT) tablet Take 2,000 Units by mouth daily.   clotrimazole-betamethasone (LOTRISONE) cream Apply topically 2 (two) times daily as needed. (Patient taking differently: Apply 1 application topically 2 (two) times daily as needed (eczema).)   Coenzyme Q10 (COQ10) 100 MG CAPS Take 100 mg by mouth daily with lunch.    estradiol (ESTRACE) 0.1 MG/GM vaginal cream Place 1 Applicatorful vaginally daily as needed.   famotidine (PEPCID) 20 MG tablet Take 1 tablet (20 mg total) by mouth 2 (two) times daily.   fluticasone (FLONASE) 50 MCG/ACT nasal spray Place 1 spray into both nostrils daily as needed for allergies or rhinitis.   furosemide (LASIX) 20 MG tablet Take 1 tablet (20 mg total) by mouth daily as needed (fluid).   hydrochlorothiazide (MICROZIDE) 12.5 MG capsule Take 12.5 mg by mouth daily as needed (excess fluid/swelling).  loratadine (CLARITIN) 10 MG tablet Take 10 mg by mouth daily as needed for allergies.   losartan (COZAAR) 25 MG tablet Take 1 tablet (25 mg total) by mouth 2 (two) times a week. Take one tablet (25 mg) by mouth twice weekly - Tuesdays and Thursdays   metFORMIN (GLUCOPHAGE) 500 MG tablet TAKE 1 TABLET BY MOUTH TWICE DAILY WITH  A MEAL   OneTouch Delica Lancets 33G MISC USE   TO CHECK GLUCOSE BEFORE BREAKFAST AND  DINNER   OXYGEN Inhale 1 L into the lungs at bedtime as needed (oxygen).   potassium chloride SA (KLOR-CON) 20 MEQ tablet Take 1 tablet (20 mEq total) by mouth every morning.   rosuvastatin (CRESTOR) 20 MG tablet Take 20 mg by mouth daily.   tiZANidine (ZANAFLEX) 2 MG tablet Take 1 tablet (2 mg total) by mouth every 6 (six) hours as needed.   Vitamin D, Ergocalciferol, (DRISDOL) 1.25 MG (50000 UNIT) CAPS capsule Take one capsule twice weekly on Tues/Friday (Patient taking differently: Take 50,000 Units by mouth every Friday.)   oxyCODONE-acetaminophen (PERCOCET/ROXICET) 5-325 MG tablet Take 1 tablet by mouth every 4 (four) hours as needed for severe pain. (Patient not taking: Reported on 06/14/2021)   No facility-administered encounter medications on file as of 06/14/2021.    Allergies (verified) Pravastatin, Haemophilus influenzae vaccines, Sulfa antibiotics, Sulfonamide derivatives, and Tetanus toxoids   History: Past Medical History:  Diagnosis Date   Arthritis    hands   Bladder infection    Carpal tunnel syndrome    Complication of anesthesia    Difficult to arouse   Diabetes mellitus    GERD (gastroesophageal reflux disease)    Hypertension    Sleep apnea    Past Surgical History:  Procedure Laterality Date   ABDOMINAL HYSTERECTOMY  1985   partial   APPENDECTOMY     age 53   CARPAL TUNNEL RELEASE Bilateral 1980   cataract surgery Right 07/07/2020   Dr. Dione Booze   cataract surgery Left 12/2020   HERNIA REPAIR  1990   TONSILLECTOMY     as a child   TOTAL KNEE ARTHROPLASTY Right 04/19/2019   Procedure: RIGHT TOTAL KNEE ARTHROPLASTY;  Surgeon: Gean Birchwood, MD;  Location: [facility];  Service: Orthopedics;  Laterality: Right;   TOTAL KNEE ARTHROPLASTY Left 05/21/2021   Procedure: LEFT TOTAL KNEE ARTHROPLASTY;  Surgeon: Gean Birchwood, MD;  Location: [facility];  Service: Orthopedics;  Laterality:  Left;   Family History  Problem Relation Age of Onset   Heart attack Father 70   Stroke Father 10   Diabetes Father    Diabetes Brother    Diabetes Brother    Breast cancer Sister 60   Diabetes Mother    Hypertension Mother    Social History   Socioeconomic History   Marital status: Widowed    Spouse name: Not on file   Number of children: 2   Years of education: Not on file   Highest education level: Not on file  Occupational History   Occupation: retired  Tobacco Use   Smoking status: Never   Smokeless tobacco: Never  Vaping Use   Vaping Use: Never used  Substance and Sexual Activity   Alcohol use: No   Drug use: No   Sexual activity: Not Currently  Other Topics Concern   Not on file  Social History Narrative   Not on file   Social Determinants of Health   Financial Resource Strain: Low Risk    Difficulty of Paying  Living Expenses: Not hard at all  Food Insecurity: No Food Insecurity   Worried About Running Out of Food in the Last Year: Never true   Ran Out of Food in the Last Year: Never true  Transportation Needs: No Transportation Needs   Lack of Transportation (Medical): No   Lack of Transportation (Non-Medical): No  Physical Activity: Insufficiently Active   Days of Exercise per Week: 2 days   Minutes of Exercise per Session: 60 min  Stress: No Stress Concern Present   Feeling of Stress : Not at all  Social Connections: Not on file    Tobacco Counseling Counseling given: Not Answered   Clinical Intake:  Pre-visit preparation completed: Yes  Pain : No/denies pain     Nutritional Status: BMI > 30  Obese Nutritional Risks: None Diabetes: Yes  How often do you need to have someone help you when you read instructions, pamphlets, or other written materials from your doctor or pharmacy?: 1 - Never  Diabetic? Yes Nutrition Risk Assessment:  Has the patient had any N/V/D within the last 2 months?  No  Does the patient have any non-healing  wounds?  No  Has the patient had any unintentional weight loss or weight gain?  Yes   Diabetes:  Is the patient diabetic?  Yes  If diabetic, was a CBG obtained today?  No  Did the patient bring in their glucometer from home?  No  How often do you monitor your CBG's? Twice daily.   Financial Strains and Diabetes Management:  Are you having any financial strains with the device, your supplies or your medication? No .  Does the patient want to be seen by Chronic Care Management for management of their diabetes?  No  Would the patient like to be referred to a Nutritionist or for Diabetic Management?  No   Diabetic Exams:  Diabetic Eye Exam: Completed 2022 Diabetic Foot Exam: Overdue, Pt has been advised about the importance in completing this exam. Pt is scheduled for diabetic foot exam on next appointment.   Interpreter Needed?: No  Information entered by :: NAllen LPN   Activities of Daily Living In your present state of health, do you have any difficulty performing the following activities: 06/14/2021 05/21/2021  Hearing? N N  Vision? N N  Difficulty concentrating or making decisions? N N  Walking or climbing stairs? N Y  Comment - -  Dressing or bathing? N N  Doing errands, shopping? N N  Preparing Food and eating ? N -  Using the Toilet? N -  In the past six months, have you accidently leaked urine? Y -  Do you have problems with loss of bowel control? N -  Managing your Medications? N -  Managing your Finances? N -  Housekeeping or managing your Housekeeping? Y -  Comment daughter helping since surgery -  Some recent data might be hidden    Patient Care Team: Dorothyann Peng, MD as PCP - General (Internal Medicine) [place], Maryjane Hurter, [facility] (Inactive) (Pharmacist)  Indicate any recent Medical Services you may have received from other than [facility] providers in the past year (date may be approximate).     Assessment:   This is a routine wellness examination for  [place].  Hearing/Vision screen Vision Screening - Comments:: Regular eye exams, Dr. Dione Booze  Dietary issues and exercise activities discussed: Current Exercise Habits: Structured exercise class (physical therapy), Time (Minutes): 60, Frequency (Times/Week): 2, Weekly Exercise (Minutes/Week): 120   Goals Addressed  This Visit's Progress    Patient Stated       06/14/2021, wants to get knee healed       Depression Screen PHQ 2/9 Scores 06/14/2021 06/08/2020 10/07/2019 06/09/2019 09/02/2018 07/02/2018 06/04/2018  PHQ - 2 Score 0 0 0 0 0 0 0  PHQ- 9 Score - - - 3 - - -    Fall Risk Fall Risk  06/14/2021 06/08/2020 10/07/2019 06/09/2019 09/02/2018  Falls in the past year? 0 0 0 0 0  Risk for fall due to : Impaired balance/gait;Impaired mobility;Medication side effect Medication side effect - Medication side effect -  Follow up Falls evaluation completed;Education provided;Falls prevention discussed Falls evaluation completed;Education provided;Falls prevention discussed - Falls evaluation completed;Education provided;Falls prevention discussed -    FALL RISK PREVENTION PERTAINING TO THE HOME:  Any stairs in or around the home? Yes  If so, are there any without handrails? No  Home free of loose throw rugs in walkways, pet beds, electrical cords, etc? Yes  Adequate lighting in your home to reduce risk of falls? Yes   ASSISTIVE DEVICES UTILIZED TO PREVENT FALLS:  Life alert? No  Use of a cane, walker or w/c? Yes  Grab bars in the bathroom? No  Shower chair or bench in shower? No  Elevated toilet seat or a handicapped toilet? Yes   TIMED UP AND GO:  Was the test performed? No .      Cognitive Function:     6CIT Screen 06/14/2021 06/08/2020 06/09/2019 06/04/2018  What Year? 0 points 0 points 0 points 0 points  What month? 0 points 0 points 0 points 0 points  What time? 0 points 0 points 0 points 3 points  Count back from 20 0 points 2 points 0 points 0 points   Months in reverse 2 points 2 points 2 points 2 points  Repeat phrase 2 points 0 points 4 points 2 points  Total Score 4 4 6 7     Immunizations Immunization History  Administered Date(s) Administered   PFIZER(Purple Top)SARS-COV-2 Vaccination 09/02/2019, 09/27/2019, 06/13/2020    TDAP status: Up to date, allergy  Flu Vaccine status: Up to date, allergy  Pneumococcal vaccine status: Declined,  Education has been provided regarding the importance of this vaccine but patient still declined. Advised may receive this vaccine at local pharmacy or Health Dept. Aware to provide a copy of the vaccination record if obtained from local pharmacy or Health Dept. Verbalized acceptance and understanding.   Covid-19 vaccine status: Completed vaccines  Qualifies for Shingles Vaccine? Yes   Zostavax completed No   Shingrix Completed?: No.    Education has been provided regarding the importance of this vaccine. Patient has been advised to call insurance company to determine out of pocket expense if they have not yet received this vaccine. Advised may also receive vaccine at local pharmacy or Health Dept. Verbalized acceptance and understanding.  Screening Tests Health Maintenance  Topic Date Due   FOOT EXAM  06/08/2020   OPHTHALMOLOGY EXAM  05/09/2021   TETANUS/TDAP  06/26/2021 (Originally 11/16/1964)   COVID-19 Vaccine (4 - Booster for Pfizer series) 06/30/2021 (Originally 08/08/2020)   Zoster Vaccines- Shingrix (1 of 2) 09/12/2021 (Originally 11/16/1964)   Pneumonia Vaccine 42+ Years old (1 - PCV) 06/14/2022 (Originally 11/16/1951)   HEMOGLOBIN A1C  11/18/2021   MAMMOGRAM  12/21/2021   DEXA SCAN  Completed   Hepatitis C Screening  Completed   HPV VACCINES  Aged Out   INFLUENZA VACCINE  Discontinued   COLONOSCOPY (  Pts 45-73yrs Insurance coverage will need to be confirmed)  Discontinued    Health Maintenance  Health Maintenance Due  Topic Date Due   FOOT EXAM  06/08/2020   OPHTHALMOLOGY EXAM   05/09/2021    Colorectal cancer screening: No longer required.   Mammogram status: Completed 12/21/2020. Repeat every year  Bone Density status: Completed 09/27/2015.   Lung Cancer Screening: (Low Dose CT Chest recommended if Age 18-80 years, 30 pack-year currently smoking OR have quit w/in 15years.) does not qualify.   Lung Cancer Screening Referral: no  Additional Screening:  Hepatitis C Screening: does qualify; Completed 09/30/2012  Vision Screening: Recommended annual ophthalmology exams for early detection of glaucoma and other disorders of the eye. Is the patient up to date with their annual eye exam?  Yes  Who is the provider or what is the name of the office in which the patient attends annual eye exams? Dr. Dione Booze If pt is not established with a provider, would they like to be referred to a provider to establish care? No .   Dental Screening: Recommended annual dental exams for proper oral hygiene  Community Resource Referral / Chronic Care Management: CRR required this visit?  No   CCM required this visit?  No      Plan:     I have personally reviewed and noted the following in the patients chart:   Medical and social history Use of alcohol, tobacco or illicit drugs  Current medications and supplements including opioid prescriptions.  Functional ability and status Nutritional status Physical activity Advanced directives List of other physicians Hospitalizations, surgeries, and ER visits in previous 12 months Vitals Screenings to include cognitive, depression, and falls Referrals and appointments  In addition, I have reviewed and discussed with patient certain preventive protocols, quality metrics, and best practice recommendations. A written personalized care plan for preventive services as well as general preventive health recommendations were provided to patient.     Barb Merino, LPN   [id number]   Nurse Notes: none

## 2021-06-14 NOTE — Patient Instructions (Signed)
[name] , Thank you for taking time to come for your Medicare Wellness Visit. I appreciate your ongoing commitment to your health goals. Please review the following plan we discussed and let me know if I can assist you in the future.   Screening recommendations/referrals: Colonoscopy: not required Mammogram: completed 12/21/2020 Bone Density: completed 09/27/2015 Recommended yearly ophthalmology/optometry visit for glaucoma screening and checkup Recommended yearly dental visit for hygiene and checkup  Vaccinations: Influenza vaccine: allergy Pneumococcal vaccine: decline Tdap vaccine: allergy Shingles vaccine: decline   Covid-19: 06/13/2020, 09/27/2019, 09/02/2019  Advanced directives: Please bring a copy of your POA (Power of Attorney) and/or Living Will to your next appointment.   Conditions/risks identified: none  Next appointment: Follow up in one year for your annual wellness visit    Preventive Care 65 Years and Older, Female Preventive care refers to lifestyle choices and visits with your health care provider that can promote health and wellness. What does preventive care include? A yearly physical exam. This is also called an annual well check. Dental exams once or twice a year. Routine eye exams. Ask your health care provider how often you should have your eyes checked. Personal lifestyle choices, including: Daily care of your teeth and gums. Regular physical activity. Eating a healthy diet. Avoiding tobacco and drug use. Limiting alcohol use. Practicing safe sex. Taking low-dose aspirin every day. Taking vitamin and mineral supplements as recommended by your health care provider. What happens during an annual well check? The services and screenings done by your health care provider during your annual well check will depend on your age, overall health, lifestyle risk factors, and family history of disease. Counseling  Your health care provider may ask you questions  about your: Alcohol use. Tobacco use. Drug use. Emotional well-being. Home and relationship well-being. Sexual activity. Eating habits. History of falls. Memory and ability to understand (cognition). Work and work Astronomer. Reproductive health. Screening  You may have the following tests or measurements: Height, weight, and BMI. Blood pressure. Lipid and cholesterol levels. These may be checked every 5 years, or more frequently if you are over 55 years old. Skin check. Lung cancer screening. You may have this screening every year starting at age 65 if you have a 30-pack-year history of smoking and currently smoke or have quit within the past 15 years. Fecal occult blood test (FOBT) of the stool. You may have this test every year starting at age 51. Flexible sigmoidoscopy or colonoscopy. You may have a sigmoidoscopy every 5 years or a colonoscopy every 10 years starting at age 43. Hepatitis C blood test. Hepatitis B blood test. Sexually transmitted disease (STD) testing. Diabetes screening. This is done by checking your blood sugar (glucose) after you have not eaten for a while (fasting). You may have this done every 1-3 years. Bone density scan. This is done to screen for osteoporosis. You may have this done starting at age 71. Mammogram. This may be done every 1-2 years. Talk to your health care provider about how often you should have regular mammograms. Talk with your health care provider about your test results, treatment options, and if necessary, the need for more tests. Vaccines  Your health care provider may recommend certain vaccines, such as: Influenza vaccine. This is recommended every year. Tetanus, diphtheria, and acellular pertussis (Tdap, Td) vaccine. You may need a Td booster every 10 years. Zoster vaccine. You may need this after age 80. Pneumococcal 13-valent conjugate (PCV13) vaccine. One dose is recommended after age 59.  Pneumococcal polysaccharide (PPSV23)  vaccine. One dose is recommended after age 51. Talk to your health care provider about which screenings and vaccines you need and how often you need them. This information is not intended to replace advice given to you by your health care provider. Make sure you discuss any questions you have with your health care provider. Document Released: 07/07/2015 Document Revised: 02/28/2016 Document Reviewed: 04/11/2015 Elsevier Interactive Patient Education  2017 [place] Prevention in the Home Falls can cause injuries. They can happen to people of all ages. There are many things you can do to make your home safe and to help prevent falls. What can I do on the outside of my home? Regularly fix the edges of walkways and driveways and fix any cracks. Remove anything that might make you trip as you walk through a door, such as a raised step or threshold. Trim any bushes or trees on the path to your home. Use bright outdoor lighting. Clear any walking paths of anything that might make someone trip, such as rocks or tools. Regularly check to see if handrails are loose or broken. Make sure that both sides of any steps have handrails. Any raised decks and porches should have guardrails on the edges. Have any leaves, snow, or ice cleared regularly. Use sand or salt on walking paths during winter. Clean up any spills in your garage right away. This includes oil or grease spills. What can I do in the bathroom? Use night lights. Install grab bars by the toilet and in the tub and shower. Do not use towel bars as grab bars. Use non-skid mats or decals in the tub or shower. If you need to sit down in the shower, use a plastic, non-slip stool. Keep the floor dry. Clean up any water that spills on the floor as soon as it happens. Remove soap buildup in the tub or shower regularly. Attach bath mats securely with double-sided non-slip rug tape. Do not have throw rugs and other things on the floor that  can make you trip. What can I do in the bedroom? Use night lights. Make sure that you have a light by your bed that is easy to reach. Do not use any sheets or blankets that are too big for your bed. They should not hang down onto the floor. Have a firm chair that has side arms. You can use this for support while you get dressed. Do not have throw rugs and other things on the floor that can make you trip. What can I do in the kitchen? Clean up any spills right away. Avoid walking on wet floors. Keep items that you use a lot in easy-to-reach places. If you need to reach something above you, use a strong step stool that has a grab bar. Keep electrical cords out of the way. Do not use floor polish or wax that makes floors slippery. If you must use wax, use non-skid floor wax. Do not have throw rugs and other things on the floor that can make you trip. What can I do with my stairs? Do not leave any items on the stairs. Make sure that there are handrails on both sides of the stairs and use them. Fix handrails that are broken or loose. Make sure that handrails are as long as the stairways. Check any carpeting to make sure that it is firmly attached to the stairs. Fix any carpet that is loose or worn. Avoid having throw rugs at the  top or bottom of the stairs. If you do have throw rugs, attach them to the floor with carpet tape. Make sure that you have a light switch at the top of the stairs and the bottom of the stairs. If you do not have them, ask someone to add them for you. What else can I do to help prevent falls? Wear shoes that: Do not have high heels. Have rubber bottoms. Are comfortable and fit you well. Are closed at the toe. Do not wear sandals. If you use a stepladder: Make sure that it is fully opened. Do not climb a closed stepladder. Make sure that both sides of the stepladder are locked into place. Ask someone to hold it for you, if possible. Clearly mark and make sure that you  can see: Any grab bars or handrails. First and last steps. Where the edge of each step is. Use tools that help you move around (mobility aids) if they are needed. These include: Canes. Walkers. Scooters. Crutches. Turn on the lights when you go into a dark area. Replace any light bulbs as soon as they burn out. Set up your furniture so you have a clear path. Avoid moving your furniture around. If any of your floors are uneven, fix them. If there are any pets around you, be aware of where they are. Review your medicines with your doctor. Some medicines can make you feel dizzy. This can increase your chance of falling. Ask your doctor what other things that you can do to help prevent falls. This information is not intended to replace advice given to you by your health care provider. Make sure you discuss any questions you have with your health care provider. Document Released: 04/06/2009 Document Revised: 11/16/2015 Document Reviewed: 07/15/2014 Elsevier Interactive Patient Education  2017 [employer].

## 2021-06-21 DIAGNOSIS — M25662 Stiffness of left knee, not elsewhere classified: Secondary | ICD-10-CM | POA: Diagnosis not present

## 2021-06-21 DIAGNOSIS — Z96652 Presence of left artificial knee joint: Secondary | ICD-10-CM | POA: Diagnosis not present

## 2021-06-21 DIAGNOSIS — M6281 Muscle weakness (generalized): Secondary | ICD-10-CM | POA: Diagnosis not present

## 2021-06-26 DIAGNOSIS — M25662 Stiffness of left knee, not elsewhere classified: Secondary | ICD-10-CM | POA: Diagnosis not present

## 2021-06-26 DIAGNOSIS — Z96652 Presence of left artificial knee joint: Secondary | ICD-10-CM | POA: Diagnosis not present

## 2021-06-26 DIAGNOSIS — M6281 Muscle weakness (generalized): Secondary | ICD-10-CM | POA: Diagnosis not present

## 2021-06-26 DIAGNOSIS — H524 Presbyopia: Secondary | ICD-10-CM | POA: Diagnosis not present

## 2021-06-28 DIAGNOSIS — Z96652 Presence of left artificial knee joint: Secondary | ICD-10-CM | POA: Diagnosis not present

## 2021-06-28 DIAGNOSIS — M25662 Stiffness of left knee, not elsewhere classified: Secondary | ICD-10-CM | POA: Diagnosis not present

## 2021-06-28 DIAGNOSIS — M6281 Muscle weakness (generalized): Secondary | ICD-10-CM | POA: Diagnosis not present

## 2021-07-03 DIAGNOSIS — M25662 Stiffness of left knee, not elsewhere classified: Secondary | ICD-10-CM | POA: Diagnosis not present

## 2021-07-03 DIAGNOSIS — M6281 Muscle weakness (generalized): Secondary | ICD-10-CM | POA: Diagnosis not present

## 2021-07-03 DIAGNOSIS — Z96652 Presence of left artificial knee joint: Secondary | ICD-10-CM | POA: Diagnosis not present

## 2021-07-04 DIAGNOSIS — R0602 Shortness of breath: Secondary | ICD-10-CM | POA: Diagnosis not present

## 2021-07-04 DIAGNOSIS — G4733 Obstructive sleep apnea (adult) (pediatric): Secondary | ICD-10-CM | POA: Diagnosis not present

## 2021-07-04 DIAGNOSIS — I10 Essential (primary) hypertension: Secondary | ICD-10-CM | POA: Diagnosis not present

## 2021-07-04 DIAGNOSIS — R0600 Dyspnea, unspecified: Secondary | ICD-10-CM | POA: Diagnosis not present

## 2021-07-05 ENCOUNTER — Encounter: Payer: Medicare HMO | Admitting: Internal Medicine

## 2021-07-05 ENCOUNTER — Other Ambulatory Visit: Payer: Self-pay

## 2021-07-05 ENCOUNTER — Other Ambulatory Visit: Payer: Medicare HMO

## 2021-07-05 DIAGNOSIS — M6281 Muscle weakness (generalized): Secondary | ICD-10-CM | POA: Diagnosis not present

## 2021-07-05 DIAGNOSIS — D508 Other iron deficiency anemias: Secondary | ICD-10-CM

## 2021-07-05 DIAGNOSIS — M25662 Stiffness of left knee, not elsewhere classified: Secondary | ICD-10-CM | POA: Diagnosis not present

## 2021-07-05 DIAGNOSIS — Z96652 Presence of left artificial knee joint: Secondary | ICD-10-CM | POA: Diagnosis not present

## 2021-07-06 LAB — CBC
Hematocrit: 37.2 % (ref 34.0–46.6)
Hemoglobin: 11.6 g/dL (ref 11.1–15.9)
MCH: 27.8 pg (ref 26.6–33.0)
MCHC: 31.2 g/dL — ABNORMAL LOW (ref 31.5–35.7)
MCV: 89 fL (ref 79–97)
Platelets: 334 10*3/uL (ref 150–450)
RBC: 4.17 x10E6/uL (ref 3.77–5.28)
RDW: 13.9 % (ref 11.7–15.4)
WBC: 5.2 10*3/uL (ref 3.4–10.8)

## 2021-07-30 DIAGNOSIS — E119 Type 2 diabetes mellitus without complications: Secondary | ICD-10-CM | POA: Diagnosis not present

## 2021-07-30 DIAGNOSIS — H353131 Nonexudative age-related macular degeneration, bilateral, early dry stage: Secondary | ICD-10-CM | POA: Diagnosis not present

## 2021-07-30 LAB — HM DIABETES EYE EXAM

## 2021-08-01 ENCOUNTER — Encounter: Payer: Self-pay | Admitting: Internal Medicine

## 2021-08-04 DIAGNOSIS — R0602 Shortness of breath: Secondary | ICD-10-CM | POA: Diagnosis not present

## 2021-08-04 DIAGNOSIS — R0600 Dyspnea, unspecified: Secondary | ICD-10-CM | POA: Diagnosis not present

## 2021-08-04 DIAGNOSIS — G4733 Obstructive sleep apnea (adult) (pediatric): Secondary | ICD-10-CM | POA: Diagnosis not present

## 2021-08-04 DIAGNOSIS — I10 Essential (primary) hypertension: Secondary | ICD-10-CM | POA: Diagnosis not present

## 2021-08-09 ENCOUNTER — Other Ambulatory Visit: Payer: Self-pay

## 2021-08-09 ENCOUNTER — Ambulatory Visit (INDEPENDENT_AMBULATORY_CARE_PROVIDER_SITE_OTHER): Payer: Medicare HMO | Admitting: Internal Medicine

## 2021-08-09 ENCOUNTER — Encounter: Payer: Self-pay | Admitting: Internal Medicine

## 2021-08-09 VITALS — BP 124/80 | HR 64 | Temp 98.3°F | Ht 62.6 in | Wt 206.2 lb

## 2021-08-09 DIAGNOSIS — R001 Bradycardia, unspecified: Secondary | ICD-10-CM | POA: Diagnosis not present

## 2021-08-09 DIAGNOSIS — R051 Acute cough: Secondary | ICD-10-CM

## 2021-08-09 DIAGNOSIS — J0100 Acute maxillary sinusitis, unspecified: Secondary | ICD-10-CM

## 2021-08-09 DIAGNOSIS — Z6836 Body mass index (BMI) 36.0-36.9, adult: Secondary | ICD-10-CM | POA: Diagnosis not present

## 2021-08-09 MED ORDER — HYDROCODONE BIT-HOMATROP MBR 5-1.5 MG/5ML PO SOLN: 5.0000 mL | Freq: Two times a day (BID) | ORAL | 0 refills | Status: DC | PRN

## 2021-08-09 MED ORDER — BENZONATATE 100 MG PO CAPS: 100.0000 mg | ORAL_CAPSULE | Freq: Three times a day (TID) | ORAL | 1 refills | Status: DC | PRN

## 2021-08-09 MED ORDER — CEFTRIAXONE SODIUM 1 G IJ SOLR
1.0000 g | Freq: Once | INTRAMUSCULAR | Status: AC
  Administered 2021-08-09: 1 g via INTRAMUSCULAR

## 2021-08-09 MED ORDER — TRIAMCINOLONE ACETONIDE 40 MG/ML IJ SUSP
40.0000 mg | Freq: Once | INTRAMUSCULAR | Status: AC
  Administered 2021-08-09: 40 mg via INTRAMUSCULAR

## 2021-08-09 NOTE — Progress Notes (Signed)
Jeri Cos [place]ittleton,acting as a Neurosurgeon for [name], MD.,have documented all relevant documentation on the behalf of [name], MD,as directed by  [name], MD while in the presence of [name], MD.  This visit occurred during the SARS-CoV-2 public health emergency.  Safety protocols were in place, including screening questions prior to the visit, additional usage of staff PPE, and extensive cleaning of exam room while observing appropriate contact time as indicated for disinfecting solutions.  Subjective:     Patient ID: [name] , female    DOB: Oct 20, 1945 , 76 y.o.   MRN: [id number]   Chief Complaint  Patient presents with   URI    HPI  She presents today for further evaluation of worsening sinus congestion, cough productive of discolored phlegm. States she is unable to sleep at night due to the cough. Denies fever/chills. She did have a headache, this has since resolved.  She did home COVID test a week ago which was negative.   URI  This is a recurrent problem. The current episode started more than 1 month ago. The problem has been gradually worsening. There has been no fever. Associated symptoms include congestion, coughing, rhinorrhea, sinus pain and sneezing. Pertinent negatives include no sore throat. She has tried decongestant and antihistamine (cough syrup, ginger tea) for the symptoms. The treatment provided no relief.    Past Medical History:  Diagnosis Date   Arthritis    hands   Bladder infection    Carpal tunnel syndrome    Complication of anesthesia    Difficult to arouse   Diabetes mellitus    GERD (gastroesophageal reflux disease)    Hypertension    Sleep apnea      Family History  Problem Relation Age of Onset   Heart attack Father 6   Stroke Father 78   Diabetes Father    Diabetes Brother    Diabetes Brother    Breast cancer Sister 2   Diabetes Mother    Hypertension Mother      Current Outpatient Medications:     Ascorbic Acid (VITAMIN C) 1000 MG tablet, Take 1,000 mg by mouth daily., Disp: , Rfl:    aspirin EC 81 MG tablet, Take 1 tablet (81 mg total) by mouth 2 (two) times daily., Disp: 60 tablet, Rfl: 0   Bacillus Coagulans-Inulin (PROBIOTIC FORMULA PO), Take 1 capsule by mouth 2 (two) times a week., Disp: , Rfl:    benzonatate (TESSALON PERLES) 100 MG capsule, Take 1 capsule (100 mg total) by mouth 3 (three) times daily as needed for cough., Disp: 30 capsule, Rfl: 1   carvedilol (COREG) 3.125 MG tablet, Take 1 tablet by mouth twice daily, Disp: 180 tablet, Rfl: 0   cetirizine (ZYRTEC) 10 MG tablet, Take 10 mg by mouth daily as needed for allergies., Disp: , Rfl:    Cholecalciferol (VITAMIN D) 50 MCG (2000 UT) tablet, Take 2,000 Units by mouth daily., Disp: , Rfl:    clotrimazole-betamethasone (LOTRISONE) cream, Apply topically 2 (two) times daily as needed. (Patient taking differently: Apply 1 application topically 2 (two) times daily as needed (eczema).), Disp: 30 g, Rfl: 1   Coenzyme Q10 (COQ10) 100 MG CAPS, Take 100 mg by mouth daily with lunch. , Disp: , Rfl:    estradiol (ESTRACE) 0.1 MG/GM vaginal cream, Place 1 Applicatorful vaginally daily as needed., Disp: , Rfl:    famotidine (PEPCID) 20 MG tablet, Take 1 tablet (20 mg total) by mouth 2 (  two) times daily., Disp: 180 tablet, Rfl: 1   fluticasone (FLONASE) 50 MCG/ACT nasal spray, Place 1 spray into both nostrils daily as needed for allergies or rhinitis., Disp: , Rfl:    furosemide (LASIX) 20 MG tablet, Take 1 tablet (20 mg total) by mouth daily as needed (fluid)., Disp: 90 tablet, Rfl: 1   hydrochlorothiazide (MICROZIDE) 12.5 MG capsule, Take 12.5 mg by mouth daily as needed (excess fluid/swelling). , Disp: , Rfl:    HYDROcodone bit-homatropine (HYDROMET) 5-1.5 MG/5ML syrup, Take 5 mLs by mouth every 12 (twelve) hours as needed for cough., Disp: 120 mL, Rfl: 0   loratadine (CLARITIN) 10 MG tablet, Take 10 mg by mouth daily as needed for  allergies., Disp: , Rfl:    losartan (COZAAR) 25 MG tablet, Take 1 tablet (25 mg total) by mouth 2 (two) times a week. Take one tablet (25 mg) by mouth twice weekly - Tuesdays and Thursdays, Disp: 36 tablet, Rfl: 3   meloxicam (MOBIC) 15 MG tablet, Take 15 mg by mouth daily., Disp: , Rfl:    metFORMIN (GLUCOPHAGE) 500 MG tablet, TAKE 1 TABLET BY MOUTH TWICE DAILY WITH A MEAL, Disp: 180 tablet, Rfl: 0   OneTouch Delica Lancets 33G MISC, USE   TO CHECK GLUCOSE BEFORE BREAKFAST AND  DINNER, Disp: 100 each, Rfl: 0   OXYGEN, Inhale 1 L into the lungs at bedtime as needed (oxygen)., Disp: , Rfl:    potassium chloride SA (KLOR-CON) 20 MEQ tablet, Take 1 tablet (20 mEq total) by mouth every morning., Disp: 90 tablet, Rfl: 1   rosuvastatin (CRESTOR) 20 MG tablet, Take 20 mg by mouth daily., Disp: , Rfl:    Vitamin D, Ergocalciferol, (DRISDOL) 1.25 MG (50000 UNIT) CAPS capsule, Take one capsule twice weekly on Tues/Friday (Patient taking differently: Take 50,000 Units by mouth every Friday.), Disp: 24 capsule, Rfl: 0   amoxicillin-clavulanate (AUGMENTIN) 875-125 MG tablet, Take 1 tablet by mouth 2 (two) times daily for 10 days., Disp: 20 tablet, Rfl: 0   oxyCODONE-acetaminophen (PERCOCET/ROXICET) 5-325 MG tablet, Take 1 tablet by mouth every 4 (four) hours as needed for severe pain. (Patient not taking: Reported on 06/14/2021), Disp: 30 tablet, Rfl: 0   Allergies  Allergen Reactions   Pravastatin Hives   Haemophilus Influenzae Vaccines     Nausea, pain, body weakness   Sulfa Antibiotics Itching   Sulfonamide Derivatives Itching   Tetanus Toxoids Swelling    Arm swelled at injection site     Review of Systems  Constitutional:  Positive for fatigue.  HENT:  Positive for congestion, rhinorrhea, sinus pain and sneezing. Negative for sore throat.   Respiratory:  Positive for cough.   Cardiovascular: Negative.   Gastrointestinal: Negative.   Neurological: Negative.   Psychiatric/Behavioral: Negative.       Today's Vitals   08/09/21 1541  BP: 124/80  Pulse: 64  Temp: 98.3 F (36.8 C)  Weight: 206 lb 3.2 oz (93.5 kg)  Height: 5' 2.6" (1.59 m)  PainSc: 0-No pain   Body mass index is 36.99 kg/m.  Wt Readings from Last 3 Encounters:  08/09/21 206 lb 3.2 oz (93.5 kg)  06/14/21 215 lb (97.5 kg)  05/21/21 212 lb (96.2 kg)     Objective:  Physical Exam Vitals and nursing note reviewed.  Constitutional:      Appearance: Normal appearance. She is ill-appearing.  HENT:     Head: Normocephalic and atraumatic.     Comments: Right maxillary sinus tender to percussion  Nose:     Comments: Masked     Mouth/Throat:     Comments: Masked  Eyes:     Extraocular Movements: Extraocular movements intact.  Cardiovascular:     Rate and Rhythm: Normal rate and regular rhythm.     Heart sounds: Normal heart sounds.  Pulmonary:     Effort: Pulmonary effort is normal.     Breath sounds: Normal breath sounds.  Musculoskeletal:     Cervical back: Normal range of motion.  Skin:    General: Skin is warm.  Neurological:     General: No focal deficit present.     Mental Status: She is alert.  Psychiatric:        Mood and Affect: Mood normal.        Behavior: Behavior normal.        Assessment And Plan:     1. Acute non-recurrent maxillary sinusitis Comments: She was given Rocephin 1gm, Kenalog 40mg  IM x 1. I will send rx Augmentin, encouraged to complete full course. Will check PCR COVID test.  - triamcinolone acetonide (KENALOG-40) injection 40 mg - cefTRIAXone (ROCEPHIN) injection 1 g - Novel Coronavirus, NAA ([facility])  2. Acute cough Comments: I will send rx tessalon perles along with hydromet syrup to use q12h prn. She is advised to go to ER over the weekend if sx worsen.  - Novel Coronavirus, NAA ([facility])  3. Bradycardia Comments: Per Cardiology note, I will check thyroid function today. Pt would like to have this done as well.  - TSH + free T4  4. Class 2 severe obesity due  to excess calories with serious comorbidity and body mass index (BMI) of 36.0 to 36.9 in adult [facility])  She is encouraged to strive for BMI less than 30 to decrease cardiac risk. Advised to aim for at least 150 minutes of exercise per week.    Patient was given opportunity to ask questions. Patient verbalized understanding of the plan and was able to repeat key elements of the plan. All questions were answered to their satisfaction.   I, [facility], MD, have reviewed all documentation for this visit. The documentation on 08/12/21 for the exam, diagnosis, procedures, and orders are all accurate and complete.   IF YOU HAVE BEEN REFERRED TO A SPECIALIST, IT MAY TAKE 1-2 WEEKS TO SCHEDULE/PROCESS THE REFERRAL. IF YOU HAVE NOT HEARD FROM US/SPECIALIST IN TWO WEEKS, PLEASE GIVE 08/14/21 A CALL AT [phone].   THE PATIENT IS ENCOURAGED TO PRACTICE SOCIAL DISTANCING DUE TO THE COVID-19 PANDEMIC.

## 2021-08-10 ENCOUNTER — Other Ambulatory Visit: Payer: Self-pay | Admitting: Internal Medicine

## 2021-08-10 LAB — NOVEL CORONAVIRUS, NAA: SARS-CoV-2, NAA: NOT DETECTED

## 2021-08-10 LAB — TSH+FREE T4
Free T4: 1.25 ng/dL (ref 0.82–1.77)
TSH: 1.46 u[IU]/mL (ref 0.450–4.500)

## 2021-08-10 MED ORDER — AMOXICILLIN-POT CLAVULANATE 875-125 MG PO TABS: 1.0000 | ORAL_TABLET | Freq: Two times a day (BID) | ORAL | 0 refills | Status: AC

## 2021-08-12 ENCOUNTER — Encounter: Payer: Self-pay | Admitting: Internal Medicine

## 2021-08-13 ENCOUNTER — Ambulatory Visit: Payer: Medicare HMO | Admitting: Internal Medicine

## 2021-08-21 ENCOUNTER — Ambulatory Visit: Payer: Medicare HMO | Admitting: Internal Medicine

## 2021-09-01 DIAGNOSIS — R0600 Dyspnea, unspecified: Secondary | ICD-10-CM | POA: Diagnosis not present

## 2021-09-01 DIAGNOSIS — I10 Essential (primary) hypertension: Secondary | ICD-10-CM | POA: Diagnosis not present

## 2021-09-01 DIAGNOSIS — R0602 Shortness of breath: Secondary | ICD-10-CM | POA: Diagnosis not present

## 2021-09-01 DIAGNOSIS — G4733 Obstructive sleep apnea (adult) (pediatric): Secondary | ICD-10-CM | POA: Diagnosis not present

## 2021-09-11 ENCOUNTER — Ambulatory Visit (INDEPENDENT_AMBULATORY_CARE_PROVIDER_SITE_OTHER): Payer: Medicare HMO | Admitting: Internal Medicine

## 2021-09-11 ENCOUNTER — Other Ambulatory Visit: Payer: Self-pay

## 2021-09-11 ENCOUNTER — Encounter: Payer: Self-pay | Admitting: Internal Medicine

## 2021-09-11 VITALS — BP 120/76 | HR 52 | Temp 98.1°F | Ht 62.6 in | Wt 202.6 lb

## 2021-09-11 DIAGNOSIS — I119 Hypertensive heart disease without heart failure: Secondary | ICD-10-CM | POA: Diagnosis not present

## 2021-09-11 DIAGNOSIS — E559 Vitamin D deficiency, unspecified: Secondary | ICD-10-CM | POA: Diagnosis not present

## 2021-09-11 DIAGNOSIS — Z2821 Immunization not carried out because of patient refusal: Secondary | ICD-10-CM

## 2021-09-11 DIAGNOSIS — I700 Atherosclerosis of aorta: Secondary | ICD-10-CM

## 2021-09-11 DIAGNOSIS — Z0000 Encounter for general adult medical examination without abnormal findings: Secondary | ICD-10-CM

## 2021-09-11 DIAGNOSIS — R053 Chronic cough: Secondary | ICD-10-CM | POA: Diagnosis not present

## 2021-09-11 DIAGNOSIS — Z6836 Body mass index (BMI) 36.0-36.9, adult: Secondary | ICD-10-CM

## 2021-09-11 DIAGNOSIS — E66812 Obesity, class 2: Secondary | ICD-10-CM | POA: Insufficient documentation

## 2021-09-11 DIAGNOSIS — E66813 Obesity, class 3: Secondary | ICD-10-CM | POA: Insufficient documentation

## 2021-09-11 DIAGNOSIS — E1165 Type 2 diabetes mellitus with hyperglycemia: Secondary | ICD-10-CM

## 2021-09-11 LAB — POCT URINALYSIS DIPSTICK
Bilirubin, UA: NEGATIVE
Glucose, UA: NEGATIVE
Ketones, UA: NEGATIVE
Leukocytes, UA: NEGATIVE
Nitrite, UA: NEGATIVE
Protein, UA: NEGATIVE
Spec Grav, UA: 1.025 (ref 1.010–1.025)
Urobilinogen, UA: 1 E.U./dL
pH, UA: 5.5 (ref 5.0–8.0)

## 2021-09-11 MED ORDER — ALBUTEROL SULFATE HFA 108 (90 BASE) MCG/ACT IN AERS: 2.0000 | INHALATION_SPRAY | Freq: Four times a day (QID) | RESPIRATORY_TRACT | 2 refills | Status: DC | PRN

## 2021-09-11 MED ORDER — METFORMIN HCL 500 MG PO TABS: 500.0000 mg | ORAL_TABLET | Freq: Two times a day (BID) | ORAL | 2 refills | Status: DC

## 2021-09-11 NOTE — Progress Notes (Signed)
?[employer] as a Neurosurgeon for [name], MD.,have documented all relevant documentation on the behalf of [name], MD,as directed by  [name], MD while in the presence of [name], MD.  ?This visit occurred during the SARS-CoV-2 public health emergency.  Safety protocols were in place, including screening questions prior to the visit, additional usage of staff PPE, and extensive cleaning of exam room while observing appropriate contact time as indicated for disinfecting solutions. ? ?Subjective:  ?  ? Patient ID: [name] , female    DOB: 1945/08/09 , 76 y.o.   MRN: [id number] ? ? ?Chief Complaint  ?Patient presents with  ? Annual Exam  ? ? ?HPI ? ?She is here today for a full physical examination. She is followed by GYN for her pelvic exams. She has no specific concerns or complaints at this time.  ? ?Hypertension ?This is a chronic problem. The current episode started more than 1 year ago. The problem has been gradually improving since onset. The problem is controlled. Pertinent negatives include no blurred vision, chest pain, palpitations or shortness of breath. Risk factors for coronary artery disease include diabetes mellitus, dyslipidemia, obesity, post-menopausal state and sedentary lifestyle. The current treatment provides moderate improvement. Compliance problems include exercise.   ?Diabetes ?She presents for her follow-up diabetic visit. She has type 2 diabetes mellitus. Her disease course has been stable. There are no hypoglycemic associated symptoms. Tremors: . Pertinent negatives for diabetes include no blurred vision and no chest pain. There are no hypoglycemic complications. Risk factors for coronary artery disease include dyslipidemia, diabetes mellitus, hypertension, obesity, post-menopausal and sedentary lifestyle. She is following a diabetic diet. She participates in exercise intermittently. Her breakfast blood glucose is taken between 8-9 am. Her  breakfast blood glucose range is generally 110-130 mg/dl. An ACE inhibitor/angiotensin II receptor blocker is being taken. Eye exam is current.   ? ?Past Medical History:  ?Diagnosis Date  ? Arthritis   ? hands  ? Bladder infection   ? Carpal tunnel syndrome   ? Complication of anesthesia   ? Difficult to arouse  ? Diabetes mellitus   ? GERD (gastroesophageal reflux disease)   ? Hypertension   ? Sleep apnea   ?  ? ?Family History  ?Problem Relation Age of Onset  ? Heart attack Father 28  ? Stroke Father 39  ? Diabetes Father   ? Diabetes Brother   ? Diabetes Brother   ? Breast cancer Sister 52  ? Diabetes Mother   ? Hypertension Mother   ? ? ? ?Current Outpatient Medications:  ?  albuterol (VENTOLIN HFA) 108 (90 Base) MCG/ACT inhaler, Inhale 2 puffs into the lungs every 6 (six) hours as needed for wheezing or shortness of breath., Disp: 18 g, Rfl: 2 ?  Ascorbic Acid (VITAMIN C) 1000 MG tablet, Take 1,000 mg by mouth daily., Disp: , Rfl:  ?  aspirin EC 81 MG tablet, Take 1 tablet (81 mg total) by mouth 2 (two) times daily., Disp: 60 tablet, Rfl: 0 ?  Bacillus Coagulans-Inulin (PROBIOTIC FORMULA PO), Take 1 capsule by mouth 2 (two) times a week., Disp: , Rfl:  ?  benzonatate (TESSALON PERLES) 100 MG capsule, Take 1 capsule (100 mg total) by mouth 3 (three) times daily as needed for cough., Disp: 30 capsule, Rfl: 1 ?  carvedilol (COREG) 3.125 MG tablet, Take 1 tablet by mouth twice daily, Disp: 180 tablet, Rfl: 0 ?  cetirizine (ZYRTEC) 10 MG tablet, Take  10 mg by mouth daily as needed for allergies., Disp: , Rfl:  ?  Cholecalciferol (VITAMIN D) 50 MCG (2000 UT) tablet, Take 2,000 Units by mouth daily., Disp: , Rfl:  ?  clotrimazole-betamethasone (LOTRISONE) cream, Apply topically 2 (two) times daily as needed. (Patient taking differently: Apply 1 application. topically 2 (two) times daily as needed (eczema).), Disp: 30 g, Rfl: 1 ?  Coenzyme Q10 (COQ10) 100 MG CAPS, Take 100 mg by mouth daily with lunch. , Disp: , Rfl:   ?  estradiol (ESTRACE) 0.1 MG/GM vaginal cream, Place 1 Applicatorful vaginally daily as needed., Disp: , Rfl:  ?  famotidine (PEPCID) 20 MG tablet, Take 1 tablet (20 mg total) by mouth 2 (two) times daily., Disp: 180 tablet, Rfl: 1 ?  fluticasone (FLONASE) 50 MCG/ACT nasal spray, Place 1 spray into both nostrils daily as needed for allergies or rhinitis., Disp: , Rfl:  ?  furosemide (LASIX) 20 MG tablet, Take 1 tablet (20 mg total) by mouth daily as needed (fluid)., Disp: 90 tablet, Rfl: 1 ?  hydrochlorothiazide (MICROZIDE) 12.5 MG capsule, Take 12.5 mg by mouth daily as needed (excess fluid/swelling). , Disp: , Rfl:  ?  loratadine (CLARITIN) 10 MG tablet, Take 10 mg by mouth daily as needed for allergies., Disp: , Rfl:  ?  losartan (COZAAR) 25 MG tablet, Take 1 tablet (25 mg total) by mouth 2 (two) times a week. Take one tablet (25 mg) by mouth twice weekly - Tuesdays and Thursdays, Disp: 36 tablet, Rfl: 3 ?  meloxicam (MOBIC) 15 MG tablet, Take 15 mg by mouth daily., Disp: , Rfl:  ?  OneTouch Delica Lancets 33G MISC, USE   TO CHECK GLUCOSE BEFORE BREAKFAST AND  DINNER, Disp: 100 each, Rfl: 0 ?  oxyCODONE-acetaminophen (PERCOCET/ROXICET) 5-325 MG tablet, Take 1 tablet by mouth every 4 (four) hours as needed for severe pain., Disp: 30 tablet, Rfl: 0 ?  OXYGEN, Inhale 1 L into the lungs at bedtime as needed (oxygen)., Disp: , Rfl:  ?  potassium chloride SA (KLOR-CON) 20 MEQ tablet, Take 1 tablet (20 mEq total) by mouth every morning., Disp: 90 tablet, Rfl: 1 ?  rosuvastatin (CRESTOR) 20 MG tablet, Take 20 mg by mouth daily., Disp: , Rfl:  ?  HYDROcodone bit-homatropine (HYDROMET) 5-1.5 MG/5ML syrup, Take 5 mLs by mouth every 12 (twelve) hours as needed for cough., Disp: 120 mL, Rfl: 0 ?  metFORMIN (GLUCOPHAGE) 500 MG tablet, Take 1 tablet (500 mg total) by mouth 2 (two) times daily with a meal., Disp: 180 tablet, Rfl: 2 ?  Vitamin D, Ergocalciferol, (DRISDOL) 1.25 MG (50000 UNIT) CAPS capsule, Take one capsule  twice weekly on Tues/Friday (Patient taking differently: Take 50,000 Units by mouth every Friday.), Disp: 24 capsule, Rfl: 0  ? ?Allergies  ?Allergen Reactions  ? Pravastatin Hives  ? Haemophilus Influenzae Vaccines   ?  Nausea, pain, body weakness  ? Sulfa Antibiotics Itching  ? Sulfonamide Derivatives Itching  ? Tetanus Toxoids Swelling  ?  Arm swelled at injection site  ?  ? ? ?The patient states she uses post menopausal status for birth control. Last LMP was No LMP recorded. Patient has had a hysterectomy.. Negative for Dysmenorrhea. Negative for: breast discharge, breast lump(s), breast pain and breast self exam. Associated symptoms include abnormal vaginal bleeding. Pertinent negatives include abnormal bleeding (hematology), anxiety, decreased libido, depression, difficulty falling sleep, dyspareunia, history of infertility, nocturia, sexual dysfunction, sleep disturbances, urinary incontinence, urinary urgency, vaginal discharge and vaginal itching. Diet regular.The  patient states her exercise level is  intermittent. ? . The patient's tobacco use is:  ?Social History  ? ?Tobacco Use  ?Smoking Status Never  ?Smokeless Tobacco Never  ?Marland Kitchen. She has been exposed to passive smoke. The patient's alcohol use is:  ?Social History  ? ?Substance and Sexual Activity  ?Alcohol Use No  ? ? ?Review of Systems  ?Constitutional: Negative.   ?HENT: Negative.    ?Eyes: Negative.  Negative for blurred vision.  ?Respiratory: Negative.  Negative for shortness of breath.   ?Cardiovascular: Negative.  Negative for chest pain and palpitations.  ?Gastrointestinal: Negative.   ?Endocrine: Negative.   ?Genitourinary: Negative.   ?Musculoskeletal: Negative.   ?Skin: Negative.   ?Allergic/Immunologic: Negative.   ?Neurological: Negative.  Tremors: .  ?Hematological: Negative.   ?Psychiatric/Behavioral: Negative.     ? ?Today's Vitals  ? 09/11/21 1156  ?BP: 120/76  ?Pulse: (!) 52  ?Temp: 98.1 ?F (36.7 ?C)  ?Weight: 202 lb 9.6 oz (91.9 kg)   ?Height: 5' 2.6" (1.59 m)  ?PainSc: 0-No pain  ? ?Body mass index is 36.35 kg/m?.  ?Wt Readings from Last 3 Encounters:  ?09/11/21 202 lb 9.6 oz (91.9 kg)  ?08/09/21 206 lb 3.2 oz (93.5 kg)  ?06/14/21 215 l

## 2021-09-11 NOTE — Patient Instructions (Signed)

## 2021-09-12 LAB — CMP14+EGFR
ALT: 8 IU/L (ref 0–32)
AST: 17 IU/L (ref 0–40)
Albumin/Globulin Ratio: 1.6 (ref 1.2–2.2)
Albumin: 4.4 g/dL (ref 3.7–4.7)
Alkaline Phosphatase: 97 IU/L (ref 44–121)
BUN/Creatinine Ratio: 12 (ref 12–28)
BUN: 9 mg/dL (ref 8–27)
Bilirubin Total: <0.2 mg/dL (ref 0.0–1.2)
CO2: 25 mmol/L (ref 20–29)
Calcium: 9.8 mg/dL (ref 8.7–10.3)
Chloride: 104 mmol/L (ref 96–106)
Creatinine, Ser: 0.74 mg/dL (ref 0.57–1.00)
Globulin, Total: 2.8 g/dL (ref 1.5–4.5)
Glucose: 99 mg/dL (ref 70–99)
Potassium: 4.5 mmol/L (ref 3.5–5.2)
Sodium: 144 mmol/L (ref 134–144)
Total Protein: 7.2 g/dL (ref 6.0–8.5)
eGFR: 84 mL/min/{1.73_m2} (ref >59)

## 2021-09-12 LAB — CBC
Hematocrit: 39.9 % (ref 34.0–46.6)
Hemoglobin: 12.5 g/dL (ref 11.1–15.9)
MCH: 26.8 pg (ref 26.6–33.0)
MCHC: 31.3 g/dL — ABNORMAL LOW (ref 31.5–35.7)
MCV: 86 fL (ref 79–97)
Platelets: 283 10*3/uL (ref 150–450)
RBC: 4.66 x10E6/uL (ref 3.77–5.28)
RDW: 14.5 % (ref 11.7–15.4)
WBC: 5.4 10*3/uL (ref 3.4–10.8)

## 2021-09-12 LAB — MICROALBUMIN / CREATININE URINE RATIO
Creatinine, Urine: 130 mg/dL
Microalb/Creat Ratio: 7 mg/g creat (ref 0–29)
Microalbumin, Urine: 9.2 ug/mL

## 2021-09-12 LAB — LIPID PANEL
Chol/HDL Ratio: 2 ratio (ref 0.0–4.4)
Cholesterol, Total: 169 mg/dL (ref 100–199)
HDL: 84 mg/dL (ref >39)
LDL Chol Calc (NIH): 74 mg/dL (ref 0–99)
Triglycerides: 56 mg/dL (ref 0–149)
VLDL Cholesterol Cal: 11 mg/dL (ref 5–40)

## 2021-09-12 LAB — HEMOGLOBIN A1C
Est. average glucose Bld gHb Est-mCnc: 126 mg/dL
Hgb A1c MFr Bld: 6 % — ABNORMAL HIGH (ref 4.8–5.6)

## 2021-10-02 DIAGNOSIS — G4733 Obstructive sleep apnea (adult) (pediatric): Secondary | ICD-10-CM | POA: Diagnosis not present

## 2021-10-02 DIAGNOSIS — R0600 Dyspnea, unspecified: Secondary | ICD-10-CM | POA: Diagnosis not present

## 2021-10-02 DIAGNOSIS — R0602 Shortness of breath: Secondary | ICD-10-CM | POA: Diagnosis not present

## 2021-10-02 DIAGNOSIS — I10 Essential (primary) hypertension: Secondary | ICD-10-CM | POA: Diagnosis not present

## 2021-10-25 ENCOUNTER — Other Ambulatory Visit: Payer: Self-pay | Admitting: Cardiology

## 2021-11-01 DIAGNOSIS — R0602 Shortness of breath: Secondary | ICD-10-CM | POA: Diagnosis not present

## 2021-11-01 DIAGNOSIS — I10 Essential (primary) hypertension: Secondary | ICD-10-CM | POA: Diagnosis not present

## 2021-11-01 DIAGNOSIS — G4733 Obstructive sleep apnea (adult) (pediatric): Secondary | ICD-10-CM | POA: Diagnosis not present

## 2021-11-01 DIAGNOSIS — R0600 Dyspnea, unspecified: Secondary | ICD-10-CM | POA: Diagnosis not present

## 2021-11-08 ENCOUNTER — Other Ambulatory Visit: Payer: Self-pay | Admitting: Internal Medicine

## 2021-11-16 ENCOUNTER — Other Ambulatory Visit: Payer: Self-pay

## 2021-11-16 ENCOUNTER — Telehealth: Payer: Self-pay

## 2021-11-16 MED ORDER — LOSARTAN POTASSIUM 25 MG PO TABS: 25.0000 mg | ORAL_TABLET | ORAL | 3 refills | Status: DC

## 2021-11-16 NOTE — Chronic Care Management (AMB) (Cosign Needed)
Chronic Care Management Pharmacy Assistant   Name: [name]  MRN: [id number] DOB: 25-Feb-1946  Reason for Encounter: Disease State/ Diabetes  Recent office visits:  09-11-2021 Dorothyann Peng, MD. MCHC= 31.3. A1C= 6.0. Referral placed to allergy. START albuterol inhaler 2 puffs into the lungs every 6 (six) hours as needed.  08-09-2021 Dorothyann Peng, MD. Negative covid test. Rocephin and kenelog injection given. Patient reported not taking tizanidine. START HYDROMET 5 mLs every 12 hours as needed and tessalon 100 mg 3 times daily as needed.  06-14-2021 Barb Merino, LPN. Medicare wellness visit.  05-31-2021 Charlesetta Ivory, NP. DECREASE Crestor 40 mg daily TO 20 mg daily.  Recent consult visits:  07-30-2021 Olivia Canter, MD (Ophthalmology). Unable to view encounter.  07-05-2021 Brunetta Jeans (Physical Therapy). Visit for stiffness in left knee.  07-03-2021  Brunetta Jeans (Physical Therapy). Visit for stiffness in left knee.  06-28-2021  Brunetta Jeans (Physical Therapy). Visit for stiffness in left knee.  06-21-2021  Brunetta Jeans (Physical Therapy). Visit for stiffness in left knee.  06-13-2021 Brunetta Jeans (Physical Therapy). Visit for stiffness in left knee.  06-08-2021 Brunetta Jeans (Physical Therapy). Visit for stiffness in left knee.  05-29-2021 Gean Birchwood (Orthopedic surgery). Unable to view encounter.  05-21-2021 Gean Birchwood, MD (Orthopedics). Total left knee arthroplasty procedure. START oxyCODONE-acetaminophen 5-325 mg every 4 hours as needed and Tizanidine 2 mg every 6 hours as needed.  Hospital visits:  None in previous 6 months  Medications: Outpatient Encounter Medications as of 11/16/2021  Medication Sig   albuterol (VENTOLIN HFA) 108 (90 Base) MCG/ACT inhaler Inhale 2 puffs into the lungs every 6 (six) hours as needed for wheezing or shortness of breath.   Ascorbic Acid (VITAMIN C) 1000 MG tablet Take 1,000 mg by mouth daily.    aspirin EC 81 MG tablet Take 1 tablet (81 mg total) by mouth 2 (two) times daily.   Bacillus Coagulans-Inulin (PROBIOTIC FORMULA PO) Take 1 capsule by mouth 2 (two) times a week.   benzonatate (TESSALON PERLES) 100 MG capsule Take 1 capsule (100 mg total) by mouth 3 (three) times daily as needed for cough.   carvedilol (COREG) 3.125 MG tablet Take 1 tablet by mouth twice daily   cetirizine (ZYRTEC) 10 MG tablet Take 10 mg by mouth daily as needed for allergies.   Cholecalciferol (VITAMIN D) 50 MCG (2000 UT) tablet Take 2,000 Units by mouth daily.   clotrimazole-betamethasone (LOTRISONE) cream Apply topically 2 (two) times daily as needed. (Patient taking differently: Apply 1 application. topically 2 (two) times daily as needed (eczema).)   Coenzyme Q10 (COQ10) 100 MG CAPS Take 100 mg by mouth daily with lunch.    estradiol (ESTRACE) 0.1 MG/GM vaginal cream Place 1 Applicatorful vaginally daily as needed.   famotidine (PEPCID) 20 MG tablet Take 1 tablet (20 mg total) by mouth 2 (two) times daily.   fluticasone (FLONASE) 50 MCG/ACT nasal spray Place 1 spray into both nostrils daily as needed for allergies or rhinitis.   furosemide (LASIX) 20 MG tablet Take 1 tablet (20 mg total) by mouth daily as needed (fluid).   hydrochlorothiazide (MICROZIDE) 12.5 MG capsule Take 12.5 mg by mouth daily as needed (excess fluid/swelling).    HYDROcodone bit-homatropine (HYDROMET) 5-1.5 MG/5ML syrup Take 5 mLs by mouth every 12 (twelve) hours as needed for cough.   loratadine (CLARITIN) 10 MG tablet Take 10 mg by mouth daily as needed for allergies.   losartan (COZAAR) 25 MG tablet Take 1 tablet (25  mg total) by mouth 2 (two) times a week. Take one tablet (25 mg) by mouth twice weekly - Tuesdays and Thursdays   meloxicam (MOBIC) 15 MG tablet Take 15 mg by mouth daily.   metFORMIN (GLUCOPHAGE) 500 MG tablet Take 1 tablet (500 mg total) by mouth 2 (two) times daily with a meal.   OneTouch Delica Lancets [id number] MISC USE    TO CHECK GLUCOSE BEFORE BREAKFAST AND  DINNER   oxyCODONE-acetaminophen (PERCOCET/ROXICET) 5-325 MG tablet Take 1 tablet by mouth every 4 (four) hours as needed for severe pain.   OXYGEN Inhale 1 L into the lungs at bedtime as needed (oxygen).   potassium chloride SA (KLOR-CON M) 20 MEQ tablet TAKE 1 TABLET BY MOUTH ONCE DAILY AS NEEDED WITH EACH DOSE OF HYDROCHLOROTHIAZIDE   rosuvastatin (CRESTOR) 20 MG tablet Take 20 mg by mouth daily.   Vitamin D, Ergocalciferol, (DRISDOL) 1.25 MG (50000 UNIT) CAPS capsule Take one capsule twice weekly on Tues/Friday (Patient taking differently: Take 50,000 Units by mouth every Friday.)   No facility-administered encounter medications on file as of 11/16/2021.   Recent Relevant Labs: Lab Results  Component Value Date/Time   HGBA1C 6.0 (H) 09/11/2021 12:37 PM   HGBA1C 6.5 (H) 05/21/2021 12:25 PM   MICROALBUR 10 06/26/2020 01:20 PM   MICROALBUR 10 06/09/2019 04:57 PM    Kidney Function Lab Results  Component Value Date/Time   CREATININE 0.74 09/11/2021 12:37 PM   CREATININE 0.67 05/22/2021 03:28 AM   GFRNONAA >60 05/22/2021 03:28 AM   GFRAA 95 06/26/2020 11:44 AM    Current antihyperglycemic regimen:  Metformin 500 mg taking 1 tablet by mouth two times a day with a meal   What recent interventions/DTPs have been made to improve glycemic control:  Educated onA1c and blood sugar goals; Complications of diabetes including kidney damage, retinal damage, and cardiovascular disease; Exercise goal of 150 minutes per week; Prevention and management of hypoglycemic episodes; -Counseled to check feet daily and get yearly eye exams  Have there been any recent hospitalizations or ED visits since last visit with CPP? No  Patient denies hypoglycemic symptoms  Patient denies hyperglycemic symptoms  How often are you checking your blood sugar? once daily  What are your blood sugars ranging?  Fasting: 107, 93, 98 Before meals: None After meals:  None Bedtime: None  During the week, how often does your blood glucose drop below 70? Never  Are you checking your feet daily/regularly? Daily  Adherence Review: Is the patient currently on a STATIN medication? Yes Is the patient currently on ACE/ARB medication? Yes Does the patient have >5 day gap between last estimated fill dates? Yes   Care Gaps: Shingrix overdue Covid booster overdue AWV 07-04-2022  Star Rating Drugs: Rosuvastatin 40 mg- Last filled 09-25-2021 90 [facility] Metformin 500 mg- Last filled 09-11-2021  [facility] Losartan 25 mg- Last filled 04-07-2021 [facility] ([facility] confirmed last fill was 04-07-2021. Patient stated she's been taking medication as directed and has 15 pills left. Evalina Field a request for more refills)  [place] Clinical Pharmacist Assistant [phone]

## 2021-11-29 ENCOUNTER — Encounter: Payer: Self-pay | Admitting: Allergy

## 2021-11-29 ENCOUNTER — Ambulatory Visit: Payer: Medicare HMO | Admitting: Allergy

## 2021-11-29 VITALS — BP 132/62 | HR 57 | Temp 97.9°F | Resp 16 | Ht 62.5 in | Wt 209.4 lb

## 2021-11-29 DIAGNOSIS — R058 Other specified cough: Secondary | ICD-10-CM | POA: Diagnosis not present

## 2021-11-29 DIAGNOSIS — R053 Chronic cough: Secondary | ICD-10-CM | POA: Diagnosis not present

## 2021-11-29 DIAGNOSIS — J310 Chronic rhinitis: Secondary | ICD-10-CM | POA: Diagnosis not present

## 2021-11-29 MED ORDER — MONTELUKAST SODIUM 10 MG PO TABS: 10.0000 mg | ORAL_TABLET | Freq: Every day | ORAL | 3 refills | Status: DC

## 2021-11-29 MED ORDER — IPRATROPIUM BROMIDE 0.06 % NA SOLN: 2.0000 | Freq: Three times a day (TID) | NASAL | 3 refills | Status: DC | PRN

## 2021-11-29 NOTE — Patient Instructions (Addendum)
-  Lung function testing is normal! -Environmental allergy testing showed: not reactive.  Will obtain environmental allergy panel via blood work.  - Stop taking: Claritin as not very effective.  Stop cough/cold medications.  Stop flonase for now.  - Start taking:  Allegra (fexofenadine) 180mg  tablet once daily.  This is antihistamine to replace claritin.   Singulair (montelukast) 10mg  daily.  This is an antileukotriene that helps with allergy and respiratory symptoms.  It can work alongside antihistamine for better symptom control.  If you notice any change in mood/behavior/sleep after starting Singulair then stop this medication and let know.  Symptoms resolve after stopping the medication.   Atrovent (ipratropium) 0.06% one spray per nostril 3-4 times daily as needed for drainage or congestion (CAN BE OVER DRYING)   - You can use an extra dose of the antihistamine, if needed, for breakthrough symptoms.   Follow-up in 3 months or sooner if needed

## 2021-11-29 NOTE — Progress Notes (Signed)
New Patient Note  RE: Angelica KernsShirley A Schrager MRN: [id number] DOB: 1946-05-29 Date of Office Visit: 11/29/2021  Primary care provider: Dorothyann PengSanders, Robyn, MD  Chief Complaint: cough  History of present illness: Angelica KernsShirley A Galan is a 76 y.o. female presenting today for evaluation of chronic cough.    She states she has had a cough for past 8 months and seems to be worsening.  Cough is all day long.  She does feel a tickle in throat prior to need to cough.  She can feel drainage in throat and does cough and spit it out.  She has noted very infrequent wheeze.  She does note shortness of breath.  No chest tightness.  Does note nighttime awakenings to cough and will note mucus in her throat.  She does sleep with 2 pillows to help with the drainage. Her nose is congested and eyes are watery.  Also doing a lot of sneezing.  She has been using cough/cold medication like Nyquil.  She has had tessalon perls that helped but did not have any refills.  She did have a prescription recently for albuterol filled about a month ago and states has used twice a day for cough but doesn't seem to really help with the cough and thus not wheezing at this time.  She did start taking claritin that she has been taking daily recently and is somewhat helpful.  She also started using Flonase 2 sprays as needed and helps sometimes with congestion.    She did have allergy testing many years ago (>4930yrs) and states she lit up to everything and she did a couple months of allergy shots at home at that time.    No history of asthma, eczema or food allergy.  She does avoid dairy products as has heard can flare reflux.  She does take pepcid for reflux control.   Review of systems: Review of Systems  Constitutional: Negative.   HENT:         See HPI  Eyes:        See HPI  Respiratory:         See HPI  Cardiovascular: Negative.   Gastrointestinal: Negative.   Musculoskeletal: Negative.   Skin: Negative.   Allergic/Immunologic:  Negative.   Neurological: Negative.     All other systems negative unless noted above in HPI  Past medical history: Past Medical History:  Diagnosis Date   Arthritis    hands   Bladder infection    Carpal tunnel syndrome    Complication of anesthesia    Difficult to arouse   Diabetes mellitus    GERD (gastroesophageal reflux disease)    Hypertension    Sleep apnea     Past surgical history: Past Surgical History:  Procedure Laterality Date   ABDOMINAL HYSTERECTOMY  1985   partial   APPENDECTOMY     age 76   CARPAL TUNNEL RELEASE Bilateral 1980   cataract surgery Right 07/07/2020   Dr. Dione BoozeGroat   cataract surgery Left 12/2020   HERNIA REPAIR  1990   TONSILLECTOMY     as a child   TOTAL KNEE ARTHROPLASTY Right 04/19/2019   Procedure: RIGHT TOTAL KNEE ARTHROPLASTY;  Surgeon: Gean Birchwoodowan, Frank, MD;  Location: [facility];  Service: Orthopedics;  Laterality: Right;   TOTAL KNEE ARTHROPLASTY Left 05/21/2021   Procedure: LEFT TOTAL KNEE ARTHROPLASTY;  Surgeon: Gean Birchwoodowan, Frank, MD;  Location: [facility];  Service: Orthopedics;  Laterality: Left;    Family history:  Family History  Problem Relation Age of Onset   Diabetes Mother    Hypertension Mother    Asthma Father    Heart attack Father 75   Stroke Father 48   Diabetes Father    Breast cancer Sister 61   Allergic rhinitis Brother    Diabetes Brother    Diabetes Brother    Atopy Paternal Grandmother     Social history: Lives in a home without carpeting with gas electric and wood heating with central cooling.  No pets in the home.  There are 2 dogs outside the home.  There is no concern for water damage, mildew or roaches in the home.  She does not currently.  She denies any smoking history.   Medication List: Current Outpatient Medications  Medication Sig Dispense Refill   albuterol (VENTOLIN HFA) 108 (90 Base) MCG/ACT inhaler Inhale 2 puffs into the lungs every 6 (six) hours as needed for wheezing or shortness of breath. 18 g 2    Ascorbic Acid (VITAMIN C) 1000 MG tablet Take 1,000 mg by mouth daily.     aspirin EC 81 MG tablet Take 1 tablet (81 mg total) by mouth 2 (two) times daily. 60 tablet 0   Bacillus Coagulans-Inulin (PROBIOTIC FORMULA PO) Take 1 capsule by mouth 2 (two) times a week.     carvedilol (COREG) 3.125 MG tablet Take 1 tablet by mouth twice daily 180 tablet 0   clotrimazole-betamethasone (LOTRISONE) cream Apply topically 2 (two) times daily as needed. (Patient taking differently: Apply 1 application. topically 2 (two) times daily as needed (eczema).) 30 g 1   Coenzyme Q10 (COQ10) 100 MG CAPS Take 100 mg by mouth daily with lunch.      estradiol (ESTRACE) 0.1 MG/GM vaginal cream Place 1 Applicatorful vaginally daily as needed.     famotidine (PEPCID) 20 MG tablet Take 1 tablet (20 mg total) by mouth 2 (two) times daily. 180 tablet 1   fluticasone (FLONASE) 50 MCG/ACT nasal spray Place 1 spray into both nostrils daily as needed for allergies or rhinitis.     furosemide (LASIX) 20 MG tablet Take 1 tablet (20 mg total) by mouth daily as needed (fluid). 90 tablet 1   hydrochlorothiazide (MICROZIDE) 12.5 MG capsule Take 12.5 mg by mouth daily as needed (excess fluid/swelling).      loratadine (CLARITIN) 10 MG tablet Take 10 mg by mouth daily as needed for allergies.     losartan (COZAAR) 25 MG tablet Take 1 tablet (25 mg total) by mouth 2 (two) times a week. Take one tablet (25 mg) by mouth twice weekly - Tuesdays and Thursdays 36 tablet 3   metFORMIN (GLUCOPHAGE) 500 MG tablet Take 1 tablet (500 mg total) by mouth 2 (two) times daily with a meal. 180 tablet 2   OneTouch Delica Lancets 33G MISC USE   TO CHECK GLUCOSE BEFORE BREAKFAST AND  DINNER 100 each 0   potassium chloride SA (KLOR-CON M) 20 MEQ tablet TAKE 1 TABLET BY MOUTH ONCE DAILY AS NEEDED WITH EACH DOSE OF HYDROCHLOROTHIAZIDE 90 tablet 0   rosuvastatin (CRESTOR) 20 MG tablet Take 20 mg by mouth daily.     benzonatate (TESSALON PERLES) 100 MG capsule  Take 1 capsule (100 mg total) by mouth 3 (three) times daily as needed for cough. (Patient not taking: Reported on 11/29/2021) 30 capsule 1   cetirizine (ZYRTEC) 10 MG tablet Take 10 mg by mouth daily as needed for allergies. (Patient not taking: Reported on 11/29/2021)     Cholecalciferol (  VITAMIN D) 50 MCG (2000 UT) tablet Take 2,000 Units by mouth daily. (Patient not taking: Reported on 11/29/2021)     HYDROcodone bit-homatropine (HYDROMET) 5-1.5 MG/5ML syrup Take 5 mLs by mouth every 12 (twelve) hours as needed for cough. (Patient not taking: Reported on 11/29/2021) 120 mL 0   meloxicam (MOBIC) 15 MG tablet Take 15 mg by mouth daily. (Patient not taking: Reported on 11/29/2021)     oxyCODONE-acetaminophen (PERCOCET/ROXICET) 5-325 MG tablet Take 1 tablet by mouth every 4 (four) hours as needed for severe pain. (Patient not taking: Reported on 11/29/2021) 30 tablet 0   OXYGEN Inhale 1 L into the lungs at bedtime as needed (oxygen). (Patient not taking: Reported on 11/29/2021)     Vitamin D, Ergocalciferol, (DRISDOL) 1.25 MG (50000 UNIT) CAPS capsule Take one capsule twice weekly on Tues/Friday (Patient not taking: Reported on 11/29/2021) 24 capsule 0   No current facility-administered medications for this visit.    Known medication allergies: Allergies  Allergen Reactions   Pravastatin Hives   Haemophilus Influenzae Vaccines     Nausea, pain, body weakness   Sulfa Antibiotics Itching   Sulfonamide Derivatives Itching   Tetanus Toxoids Swelling    Arm swelled at injection site     Physical examination: Blood pressure 132/62, pulse (!) 57, temperature 97.9 F (36.6 C), temperature source Temporal, resp. rate 16, height 5' 2.5" (1.588 m), weight 209 lb 6.4 oz (95 kg), SpO2 96 %.  General: Alert, interactive, in no acute distress. HEENT: PERRLA, TMs pearly gray, turbinates mildly edematous with clear discharge, post-pharynx non erythematous. Neck: Supple without lymphadenopathy. Lungs: Clear to  auscultation without wheezing, rhonchi or rales. {no increased work of breathing. CV: Normal S1, S2 without murmurs. Abdomen: Nondistended, nontender. Skin: Warm and dry, without lesions or rashes. Extremities:  No clubbing, cyanosis or edema. Neuro:   Grossly intact.  Diagnositics/Labs:  Spirometry: FEV1: 1.9L 131%, FVC: 2.4L 127%, ratio consistent with nonobstructive pattern  Allergy testing:   Airborne Adult Perc - 11/29/21 0910     Time Antigen Placed 0915    Allergen Manufacturer Waynette Buttery    Location Back    Number of Test 59    1. Control-Buffer 50% Glycerol Negative    2. Control-Histamine 1 mg/ml Negative    3. Albumin saline Negative    4. Bahia Negative    5. French Southern Territories Negative    6. Johnson Negative    7. Kentucky Blue Negative    8. Meadow Fescue Negative    9. Perennial Rye Negative    10. Sweet Vernal Negative    11. Timothy Negative    12. Cocklebur Negative    13. Burweed Marshelder Negative    14. Ragweed, short Negative    15. Ragweed, Giant Negative    16. Plantain,  English Negative    17. Lamb's Quarters Negative    18. Sheep Sorrell Negative    19. Rough Pigweed Negative    20. Marsh Elder, Rough Negative    21. Mugwort, Common Negative    22. Ash mix Negative    23. Birch mix Negative    24. Beech American Negative    25. Box, Elder Negative    26. Cedar, red Negative    27. Cottonwood, Guinea-Bissau Negative    28. Elm mix Negative    29. Hickory Negative    30. Maple mix Negative    31. Oak, Guinea-[place]issau mix Negative    32. Pecan Pollen Negative    33. Beazer Homes  Negative    34. Sycamore Eastern Negative    35. Walnut, Black Pollen Negative    36. Alternaria alternata Negative    37. Cladosporium Herbarum Negative    38. Aspergillus mix Negative    39. Penicillium mix Negative    40. Bipolaris sorokiniana (Helminthosporium) Negative    41. Drechslera spicifera (Curvularia) Negative    42. Mucor plumbeus Negative    43. Fusarium moniliforme Negative     44. Aureobasidium pullulans (pullulara) Negative    45. Rhizopus oryzae Negative    46. Botrytis cinera Negative    47. Epicoccum nigrum Negative    48. Phoma betae Negative    49. Candida Albicans Negative    50. Trichophyton mentagrophytes Negative    51. Mite, D Farinae  5,000 AU/ml Negative    52. Mite, D Pteronyssinus  5,000 AU/ml Negative    53. Cat Hair 10,000 BAU/ml Negative    54.  Dog Epithelia Negative    55. Mixed Feathers Negative    56. Horse Epithelia Negative    57. Cockroach, German Negative    58. Mouse Negative    59. Tobacco Leaf Negative             Allergy testing results were read and interpreted by provider, documented by clinical staff.   Assessment and plan: Upper airway cough syndrome-most likely driven by sinus drainage  Rhinitis   -Lung function testing is normal! -Environmental allergy testing showed: not reactive.  Will obtain environmental allergy panel via blood work.  - Stop taking: Claritin as not very effective.  Stop cough/cold medications.  Stop flonase for now.  - Start taking:  Allegra (fexofenadine) 180mg  tablet once daily.  This is antihistamine to replace claritin.   Singulair (montelukast) 10mg  daily.  This is an antileukotriene that helps with allergy and respiratory symptoms.  It can work alongside antihistamine for better symptom control.  If you notice any change in mood/behavior/sleep after starting Singulair then stop this medication and let know.  Symptoms resolve after stopping the medication.   Atrovent (ipratropium) 0.06% one spray per nostril 3-4 times daily as needed for drainage or congestion (CAN BE OVER DRYING)   - You can use an extra dose of the antihistamine, if needed, for breakthrough symptoms.   Follow-up in 3 months or sooner if needed  I appreciate the opportunity to take part in Anajulia's care. Please do not hesitate to contact me with questions.  Sincerely,   ,  MD Allergy/Immunology [facility]

## 2021-12-02 DIAGNOSIS — I10 Essential (primary) hypertension: Secondary | ICD-10-CM | POA: Diagnosis not present

## 2021-12-02 DIAGNOSIS — R0602 Shortness of breath: Secondary | ICD-10-CM | POA: Diagnosis not present

## 2021-12-02 DIAGNOSIS — G4733 Obstructive sleep apnea (adult) (pediatric): Secondary | ICD-10-CM | POA: Diagnosis not present

## 2021-12-02 DIAGNOSIS — R0600 Dyspnea, unspecified: Secondary | ICD-10-CM | POA: Diagnosis not present

## 2021-12-02 LAB — ALLERGENS W/TOTAL IGE AREA 2
Alternaria Alternata IgE: <0.1 kU/L
Aspergillus Fumigatus IgE: 0.13 kU/L — AB
Bermuda Grass IgE: <0.1 kU/L
Cat Dander IgE: <0.1 kU/L
Cedar, Mountain IgE: 0.11 kU/L — AB
Cladosporium Herbarum IgE: <0.1 kU/L
Cockroach, German IgE: 0.12 kU/L — AB
Common Silver Birch IgE: 0.13 kU/L — AB
Cottonwood IgE: 0.36 kU/L — AB
D Farinae IgE: 0.13 kU/L — AB
D Pteronyssinus IgE: <0.1 kU/L
Dog Dander IgE: <0.1 kU/L
Elm, American IgE: 0.55 kU/L — AB
IgE (Immunoglobulin E), Serum: 559 IU/mL — ABNORMAL HIGH (ref 6–495)
Johnson Grass IgE: 0.12 kU/L — AB
Maple/Box Elder IgE: 0.15 kU/L — AB
Mouse Urine IgE: <0.1 kU/L
Oak, White IgE: 0.16 kU/L — AB
Pecan, Hickory IgE: 0.43 kU/L — AB
Penicillium Chrysogen IgE: 0.12 kU/L — AB
Pigweed, Rough IgE: 0.11 kU/L — AB
Ragweed, Short IgE: 0.22 kU/L — AB
Sheep Sorrel IgE Qn: 0.22 kU/L — AB
Timothy Grass IgE: <0.1 kU/L
White Mulberry IgE: <0.1 kU/L

## 2021-12-19 ENCOUNTER — Other Ambulatory Visit: Payer: Self-pay

## 2021-12-27 ENCOUNTER — Telehealth: Payer: Self-pay

## 2021-12-27 DIAGNOSIS — Z1231 Encounter for screening mammogram for malignant neoplasm of breast: Secondary | ICD-10-CM | POA: Diagnosis not present

## 2021-12-27 LAB — HM MAMMOGRAPHY: HM Mammogram: NORMAL (ref 0–4)

## 2021-12-27 NOTE — Telephone Encounter (Signed)
Please advise 

## 2021-12-27 NOTE — Telephone Encounter (Signed)
Patient states she is having nausea and dizziness while taking Singulair. She states she is still having a lot of pain in her ears & sinus issues. She is requesting an antibiotic to help clear things up.   [facility] [place]

## 2021-12-28 NOTE — Telephone Encounter (Signed)
Per provider, called patient - DOB verified - advised of provider notation below:   Oh no.  Ok have her stop the Singulair if she has not done so already.  Has she maximize the nasal Atrovent spray?  If not she can use this up to 3-4 times a day for nasal congestion or drainage control.  Nasal spray is going to be the best way to manage ear symptoms.  If she can perform saline rinse would also recommend she take this.  For chronic ongoing sinus symptoms I do not believe and antibiotic would be the best therapy as I do not believe her ongoing symptoms are bacterial in nature.   Patient verbalized understnding - will send updated myChart message if recommendations/instructions doesn't work - no further questions.

## 2022-01-01 DIAGNOSIS — G4733 Obstructive sleep apnea (adult) (pediatric): Secondary | ICD-10-CM | POA: Diagnosis not present

## 2022-01-01 DIAGNOSIS — R0600 Dyspnea, unspecified: Secondary | ICD-10-CM | POA: Diagnosis not present

## 2022-01-01 DIAGNOSIS — I10 Essential (primary) hypertension: Secondary | ICD-10-CM | POA: Diagnosis not present

## 2022-01-01 DIAGNOSIS — Z96651 Presence of right artificial knee joint: Secondary | ICD-10-CM | POA: Diagnosis not present

## 2022-01-01 DIAGNOSIS — Z96652 Presence of left artificial knee joint: Secondary | ICD-10-CM | POA: Diagnosis not present

## 2022-01-01 DIAGNOSIS — M25562 Pain in left knee: Secondary | ICD-10-CM | POA: Diagnosis not present

## 2022-01-01 DIAGNOSIS — R0602 Shortness of breath: Secondary | ICD-10-CM | POA: Diagnosis not present

## 2022-01-09 ENCOUNTER — Telehealth: Payer: Self-pay

## 2022-01-09 NOTE — Chronic Care Management (AMB) (Signed)
Care Gap(s) Not Met that Need to be Addressed:   Controlling High Blood Pressure   Action Taken: Reviewed patient's chart. Last blood pressure on 11-29-2021 132/60   Follow Up: None needed updated spreadsheet   [place] Pharmacist Assistant [phone]

## 2022-01-14 ENCOUNTER — Ambulatory Visit (INDEPENDENT_AMBULATORY_CARE_PROVIDER_SITE_OTHER): Payer: Medicare HMO | Admitting: Internal Medicine

## 2022-01-14 ENCOUNTER — Encounter: Payer: Self-pay | Admitting: Internal Medicine

## 2022-01-14 VITALS — BP 120/68 | HR 72 | Temp 98.3°F | Ht 62.0 in | Wt 213.8 lb

## 2022-01-14 DIAGNOSIS — E785 Hyperlipidemia, unspecified: Secondary | ICD-10-CM

## 2022-01-14 DIAGNOSIS — I119 Hypertensive heart disease without heart failure: Secondary | ICD-10-CM

## 2022-01-14 DIAGNOSIS — E1169 Type 2 diabetes mellitus with other specified complication: Secondary | ICD-10-CM | POA: Diagnosis not present

## 2022-01-14 DIAGNOSIS — Z6839 Body mass index (BMI) 39.0-39.9, adult: Secondary | ICD-10-CM

## 2022-01-14 NOTE — Progress Notes (Signed)
Earleen Newport as a Education administrator for [name], MD.,have documented all relevant documentation on the behalf of [name], MD,as directed by  [name], MD while in the presence of [name], MD.    Subjective:     Patient ID: [name] , female    DOB: July 02, 1945 , 76 y.o.   MRN: [id number]   Chief Complaint  Patient presents with   Diabetes    HPI  Patient is here today for a diabetes and HTN follow-up. She reports compliance with meds. Denies headaches, chest pain and shortness of breath. Patient states she is having ear pain for about a week and she also has a lingering cough.   BP Readings from Last 3 Encounters: 01/14/22 : 120/68 11/29/21 : 132/62 09/11/21 : 120/76    Diabetes She presents for her follow-up diabetic visit. She has type 2 diabetes mellitus. Her disease course has been stable. There are no hypoglycemic associated symptoms. Pertinent negatives for hypoglycemia include no headaches. Tremors: .Pertinent negatives for diabetes include no blurred vision and no chest pain. There are no hypoglycemic complications. Risk factors for coronary artery disease include dyslipidemia, diabetes mellitus, hypertension, obesity, post-menopausal and sedentary lifestyle. She is following a diabetic diet. She participates in exercise intermittently. Her breakfast blood glucose is taken between 8-9 am. Her breakfast blood glucose range is generally 110-130 mg/dl. An ACE inhibitor/angiotensin II receptor blocker is being taken. Eye exam is current.  Hypertension This is a chronic problem. The current episode started more than 1 year ago. The problem has been gradually improving since onset. The problem is controlled. Pertinent negatives include no blurred vision, chest pain, headaches, palpitations or shortness of breath. Risk factors for coronary artery disease include diabetes mellitus, dyslipidemia, obesity, post-menopausal state and sedentary lifestyle.  The current treatment provides moderate improvement. Compliance problems include exercise.      Past Medical History:  Diagnosis Date   Arthritis    hands   Bladder infection    Carpal tunnel syndrome    Complication of anesthesia    Difficult to arouse   Diabetes mellitus    GERD (gastroesophageal reflux disease)    Hypertension    Sleep apnea      Family History  Problem Relation Age of Onset   Diabetes Mother    Hypertension Mother    Asthma Father    Heart attack Father 47   Stroke Father 30   Diabetes Father    Breast cancer Sister 86   Allergic rhinitis Brother    Diabetes Brother    Diabetes Brother    Atopy Paternal Grandmother      Current Outpatient Medications:    albuterol (VENTOLIN HFA) 108 (90 Base) MCG/ACT inhaler, Inhale 2 puffs into the lungs every 6 (six) hours as needed for wheezing or shortness of breath., Disp: 18 g, Rfl: 2   Ascorbic Acid (VITAMIN C) 1000 MG tablet, Take 1,000 mg by mouth daily., Disp: , Rfl:    aspirin EC 81 MG tablet, Take 1 tablet (81 mg total) by mouth 2 (two) times daily., Disp: 60 tablet, Rfl: 0   Bacillus Coagulans-Inulin (PROBIOTIC FORMULA PO), Take 1 capsule by mouth 2 (two) times a week., Disp: , Rfl:    carvedilol (COREG) 3.125 MG tablet, Take 1 tablet by mouth twice daily, Disp: 180 tablet, Rfl: 0   Cholecalciferol (VITAMIN D) 50 MCG (2000 UT) tablet, Take 2,000 Units by mouth daily., Disp: , Rfl:    clotrimazole-betamethasone (  LOTRISONE) cream, Apply topically 2 (two) times daily as needed. (Patient taking differently: Apply 1 application  topically 2 (two) times daily as needed (eczema).), Disp: 30 g, Rfl: 1   Coenzyme Q10 (COQ10) 100 MG CAPS, Take 100 mg by mouth daily with lunch. , Disp: , Rfl:    estradiol (ESTRACE) 0.1 MG/GM vaginal cream, Place 1 Applicatorful vaginally daily as needed., Disp: , Rfl:    famotidine (PEPCID) 20 MG tablet, Take 1 tablet (20 mg total) by mouth 2 (two) times daily., Disp: 180 tablet, Rfl:  1   fluticasone (FLONASE) 50 MCG/ACT nasal spray, Place 1 spray into both nostrils daily as needed for allergies or rhinitis., Disp: , Rfl:    furosemide (LASIX) 20 MG tablet, Take 1 tablet (20 mg total) by mouth daily as needed (fluid)., Disp: 90 tablet, Rfl: 1   hydrochlorothiazide (MICROZIDE) 12.5 MG capsule, Take 12.5 mg by mouth daily as needed (excess fluid/swelling). , Disp: , Rfl:    ipratropium (ATROVENT) 0.06 % nasal spray, Place 2 sprays into both nostrils 3 (three) times daily as needed for rhinitis., Disp: 15 mL, Rfl: 3   loratadine (CLARITIN) 10 MG tablet, Take 10 mg by mouth daily as needed for allergies., Disp: , Rfl:    meloxicam (MOBIC) 15 MG tablet, Take 15 mg by mouth daily., Disp: , Rfl:    metFORMIN (GLUCOPHAGE) 500 MG tablet, Take 1 tablet (500 mg total) by mouth 2 (two) times daily with a meal., Disp: 180 tablet, Rfl: 2   OneTouch Delica Lancets 16X MISC, USE   TO CHECK GLUCOSE BEFORE BREAKFAST AND  DINNER, Disp: 100 each, Rfl: 0   potassium chloride SA (KLOR-CON M) 20 MEQ tablet, TAKE 1 TABLET BY MOUTH ONCE DAILY AS NEEDED WITH EACH DOSE OF HYDROCHLOROTHIAZIDE, Disp: 90 tablet, Rfl: 0   rosuvastatin (CRESTOR) 20 MG tablet, Take 20 mg by mouth daily., Disp: , Rfl:    benzonatate (TESSALON PERLES) 100 MG capsule, Take 1 capsule (100 mg total) by mouth 3 (three) times daily as needed for cough. (Patient not taking: Reported on 01/14/2022), Disp: 30 capsule, Rfl: 1   cetirizine (ZYRTEC) 10 MG tablet, Take 10 mg by mouth daily as needed for allergies. (Patient not taking: Reported on 11/29/2021), Disp: , Rfl:    [START ON 01/21/2022] losartan (COZAAR) 25 MG tablet, Take one tablet (25 mg) by mouth daily, Disp: 90 tablet, Rfl: 1   oxyCODONE-acetaminophen (PERCOCET/ROXICET) 5-325 MG tablet, Take 1 tablet by mouth every 4 (four) hours as needed for severe pain. (Patient not taking: Reported on 01/14/2022), Disp: 30 tablet, Rfl: 0   OXYGEN, Inhale 1 L into the lungs at bedtime as needed  (oxygen). (Patient not taking: Reported on 11/29/2021), Disp: , Rfl:    Allergies  Allergen Reactions   Pravastatin Hives   Haemophilus Influenzae Vaccines     Nausea, pain, body weakness   Sulfa Antibiotics Itching   Sulfonamide Derivatives Itching   Tetanus Toxoids Swelling    Arm swelled at injection site     Review of Systems  Constitutional: Negative.   HENT:  Positive for ear pain.        Patient states she is having ear pain for about a week and she also has a lingering cough. Denies fever/chills  Eyes: Negative.  Negative for blurred vision.  Respiratory: Negative.  Negative for shortness of breath.   Cardiovascular: Negative.  Negative for chest pain and palpitations.  Gastrointestinal: Negative.   Neurological:  Negative for headaches. Tremors: .  Psychiatric/Behavioral: Negative.       Today's Vitals   01/14/22 1158  BP: 120/68  Pulse: 72  Temp: 98.3 F (36.8 C)  TempSrc: Oral  Weight: 213 lb 12.8 oz (97 kg)  Height: '5\' 2"'  (1.575 m)  PainSc: 5   PainLoc: Ear   Body mass index is 39.1 kg/m.  Wt Readings from Last 3 Encounters:  01/14/22 213 lb 12.8 oz (97 kg)  11/29/21 209 lb 6.4 oz (95 kg)  09/11/21 202 lb 9.6 oz (91.9 kg)    Objective:  Physical Exam Vitals and nursing note reviewed.  Constitutional:      Appearance: Normal appearance.  HENT:     Head: Normocephalic and atraumatic.     Right Ear: Tympanic membrane, ear canal and external ear normal. There is no impacted cerumen.     Left Ear: Tympanic membrane, ear canal and external ear normal. There is no impacted cerumen.  Eyes:     Extraocular Movements: Extraocular movements intact.  Cardiovascular:     Rate and Rhythm: Normal rate and regular rhythm.     Heart sounds: Normal heart sounds.  Pulmonary:     Effort: Pulmonary effort is normal.     Breath sounds: Normal breath sounds.  Musculoskeletal:     Cervical back: Normal range of motion.  Skin:    General: Skin is warm.  Neurological:      General: No focal deficit present.     Mental Status: She is alert.  Psychiatric:        Mood and Affect: Mood normal.        Behavior: Behavior normal.      Assessment And Plan:     1. Dyslipidemia associated with type 2 diabetes mellitus ([place]) Comments: I will check labs as listed below. I will adjust meds as needed.  She will f/u in 4 months.  - Hemoglobin A1c - BMP8+eGFR  2. Hypertensive heart disease without heart failure Comments: Chronic, well controlled. She is encouraged to follow low sodium diet.  She will c/w carvedilol bid and losartan 72m daily. Med list reviewed, for some reason losartan listed as twice weekly. In my previous records prior to Epic, she was taking daily. Cardiology notes reviewed don't reference this dosing schedule. GFR is not prohibitive. I will have her resume daily dosing and rto for nurse visit/bp check.   3. Class 2 severe obesity due to excess calories with serious comorbidity and body mass index (BMI) of 39.0 to 39.9 in adult ([facility] Comments: She is encouraged to strive to lose 10 pounds over the next 3-4 months, this will decrease her cardiac risk.    Patient was given opportunity to ask questions. Patient verbalized understanding of the plan and was able to repeat key elements of the plan. All questions were answered to their satisfaction.    I, RMaximino Greenland MD, have reviewed all documentation for this visit. The documentation on 01/19/22 for the exam, diagnosis, procedures, and orders are all accurate and complete.   IF YOU HAVE BEEN REFERRED TO A SPECIALIST, IT MAY TAKE 1-2 WEEKS TO SCHEDULE/PROCESS THE REFERRAL. IF YOU HAVE NOT HEARD FROM US/SPECIALIST IN TWO WEEKS, PLEASE GIVE [place] CALL AT [phone].   THE PATIENT IS ENCOURAGED TO PRACTICE SOCIAL DISTANCING DUE TO THE COVID-19 PANDEMIC.

## 2022-01-14 NOTE — Patient Instructions (Signed)

## 2022-01-15 LAB — HEMOGLOBIN A1C
Est. average glucose Bld gHb Est-mCnc: 131 mg/dL
Hgb A1c MFr Bld: 6.2 % — ABNORMAL HIGH (ref 4.8–5.6)

## 2022-01-15 LAB — BMP8+EGFR
BUN/Creatinine Ratio: 19 (ref 12–28)
BUN: 13 mg/dL (ref 8–27)
CO2: 23 mmol/L (ref 20–29)
Calcium: 9.1 mg/dL (ref 8.7–10.3)
Chloride: 106 mmol/L (ref 96–106)
Creatinine, Ser: 0.7 mg/dL (ref 0.57–1.00)
Glucose: 96 mg/dL (ref 70–99)
Potassium: 4.3 mmol/L (ref 3.5–5.2)
Sodium: 144 mmol/L (ref 134–144)
eGFR: 89 mL/min/{1.73_m2} (ref >59)

## 2022-01-16 DIAGNOSIS — N3020 Other chronic cystitis without hematuria: Secondary | ICD-10-CM | POA: Diagnosis not present

## 2022-01-16 DIAGNOSIS — R8271 Bacteriuria: Secondary | ICD-10-CM | POA: Diagnosis not present

## 2022-01-16 DIAGNOSIS — N139 Obstructive and reflux uropathy, unspecified: Secondary | ICD-10-CM | POA: Diagnosis not present

## 2022-01-16 DIAGNOSIS — N952 Postmenopausal atrophic vaginitis: Secondary | ICD-10-CM | POA: Diagnosis not present

## 2022-01-19 MED ORDER — LOSARTAN POTASSIUM 25 MG PO TABS: ORAL_TABLET | ORAL | 1 refills | Status: DC

## 2022-02-01 DIAGNOSIS — R0602 Shortness of breath: Secondary | ICD-10-CM | POA: Diagnosis not present

## 2022-02-01 DIAGNOSIS — G4733 Obstructive sleep apnea (adult) (pediatric): Secondary | ICD-10-CM | POA: Diagnosis not present

## 2022-02-01 DIAGNOSIS — I10 Essential (primary) hypertension: Secondary | ICD-10-CM | POA: Diagnosis not present

## 2022-02-01 DIAGNOSIS — R0600 Dyspnea, unspecified: Secondary | ICD-10-CM | POA: Diagnosis not present

## 2022-02-18 ENCOUNTER — Encounter: Payer: Self-pay | Admitting: Internal Medicine

## 2022-02-18 ENCOUNTER — Ambulatory Visit (INDEPENDENT_AMBULATORY_CARE_PROVIDER_SITE_OTHER): Payer: Medicare HMO | Admitting: Internal Medicine

## 2022-02-18 VITALS — BP 122/80 | Temp 98.1°F | Ht 62.0 in | Wt 210.8 lb

## 2022-02-18 DIAGNOSIS — I700 Atherosclerosis of aorta: Secondary | ICD-10-CM | POA: Diagnosis not present

## 2022-02-18 DIAGNOSIS — I119 Hypertensive heart disease without heart failure: Secondary | ICD-10-CM

## 2022-02-18 DIAGNOSIS — E559 Vitamin D deficiency, unspecified: Secondary | ICD-10-CM

## 2022-02-18 DIAGNOSIS — E6609 Other obesity due to excess calories: Secondary | ICD-10-CM | POA: Diagnosis not present

## 2022-02-18 DIAGNOSIS — Z6838 Body mass index (BMI) 38.0-38.9, adult: Secondary | ICD-10-CM | POA: Diagnosis not present

## 2022-02-18 NOTE — Patient Instructions (Signed)

## 2022-02-18 NOTE — Progress Notes (Signed)
I,[name],acting as a scribe for Angelica Greenland, MD.,have documented all relevant documentation on the behalf of Angelica Greenland, MD,as directed by  Angelica Greenland, MD while in the presence of Angelica Greenland, MD.    Subjective:     Patient ID: [name] , female    DOB: 1945/10/20 , 76 y.o.   MRN: [id number]   Chief Complaint  Patient presents with   Diabetes   Hypertension    HPI  Patient is here today for a HTN follow-up. She reports compliance with meds. At her last visit, losartan dosing was changed to daily dosing (was previously only taking twice weekly for some reason). She has not had any issues with this dosing schedule. Denies headaches, chest pain and shortness of breath.    Hypertension This is a chronic problem. The current episode started more than 1 year ago. The problem has been gradually improving since onset. The problem is controlled. Pertinent negatives include no palpitations or shortness of breath. Risk factors for coronary artery disease include diabetes mellitus, dyslipidemia, obesity, post-menopausal state and sedentary lifestyle. The current treatment provides moderate improvement. Compliance problems include exercise.      Past Medical History:  Diagnosis Date   Arthritis    hands   Bladder infection    Carpal tunnel syndrome    Complication of anesthesia    Difficult to arouse   Diabetes mellitus    GERD (gastroesophageal reflux disease)    Hypertension    Sleep apnea      Family History  Problem Relation Age of Onset   Diabetes Mother    Hypertension Mother    Asthma Father    Heart attack Father 20   Stroke Father 21   Diabetes Father    Breast cancer Sister 71   Allergic rhinitis Brother    Diabetes Brother    Diabetes Brother    Atopy Paternal Grandmother      Current Outpatient Medications:    albuterol (VENTOLIN HFA) 108 (90 Base) MCG/ACT inhaler, Inhale 2 puffs into the lungs every 6 (six) hours as needed  for wheezing or shortness of breath., Disp: 18 g, Rfl: 2   Ascorbic Acid (VITAMIN C) 1000 MG tablet, Take 1,000 mg by mouth daily., Disp: , Rfl:    aspirin EC 81 MG tablet, Take 1 tablet (81 mg total) by mouth 2 (two) times daily., Disp: 60 tablet, Rfl: 0   Bacillus Coagulans-Inulin (PROBIOTIC FORMULA PO), Take 1 capsule by mouth 2 (two) times a week., Disp: , Rfl:    benzonatate (TESSALON PERLES) 100 MG capsule, Take 1 capsule (100 mg total) by mouth 3 (three) times daily as needed for cough., Disp: 30 capsule, Rfl: 1   carvedilol (COREG) 3.125 MG tablet, Take 1 tablet by mouth twice daily, Disp: 180 tablet, Rfl: 0   cetirizine (ZYRTEC) 10 MG tablet, Take 10 mg by mouth daily as needed for allergies., Disp: , Rfl:    Cholecalciferol (VITAMIN D) 50 MCG (2000 UT) tablet, Take 2,000 Units by mouth daily., Disp: , Rfl:    clotrimazole-betamethasone (LOTRISONE) cream, Apply topically 2 (two) times daily as needed. (Patient taking differently: Apply 1 application  topically 2 (two) times daily as needed (eczema).), Disp: 30 g, Rfl: 1   Coenzyme Q10 (COQ10) 100 MG CAPS, Take 100 mg by mouth daily with lunch. , Disp: , Rfl:    estradiol (ESTRACE) 0.1 MG/GM vaginal cream, Place 1 Applicatorful vaginally daily as needed., Disp: , Rfl:  fluticasone (FLONASE) 50 MCG/ACT nasal spray, Place 1 spray into both nostrils daily as needed for allergies or rhinitis., Disp: , Rfl:    furosemide (LASIX) 20 MG tablet, Take 1 tablet (20 mg total) by mouth daily as needed (fluid)., Disp: 90 tablet, Rfl: 1   hydrochlorothiazide (MICROZIDE) 12.5 MG capsule, Take 12.5 mg by mouth daily as needed (excess fluid/swelling). , Disp: , Rfl:    ipratropium (ATROVENT) 0.06 % nasal spray, Place 2 sprays into both nostrils 3 (three) times daily as needed for rhinitis., Disp: 15 mL, Rfl: 3   loratadine (CLARITIN) 10 MG tablet, Take 10 mg by mouth daily as needed for allergies., Disp: , Rfl:    losartan (COZAAR) 25 MG tablet, Take one  tablet (25 mg) by mouth daily, Disp: 90 tablet, Rfl: 1   meloxicam (MOBIC) 15 MG tablet, Take 15 mg by mouth daily., Disp: , Rfl:    metFORMIN (GLUCOPHAGE) 500 MG tablet, Take 1 tablet (500 mg total) by mouth 2 (two) times daily with a meal., Disp: 180 tablet, Rfl: 2   OneTouch Delica Lancets 82L MISC, USE   TO CHECK GLUCOSE BEFORE BREAKFAST AND  DINNER, Disp: 100 each, Rfl: 0   oxyCODONE-acetaminophen (PERCOCET/ROXICET) 5-325 MG tablet, Take 1 tablet by mouth every 4 (four) hours as needed for severe pain., Disp: 30 tablet, Rfl: 0   OXYGEN, Inhale 1 L into the lungs at bedtime as needed (oxygen)., Disp: , Rfl:    potassium chloride SA (KLOR-CON M) 20 MEQ tablet, TAKE 1 TABLET BY MOUTH ONCE DAILY AS NEEDED WITH EACH DOSE OF HYDROCHLOROTHIAZIDE, Disp: 90 tablet, Rfl: 0   rosuvastatin (CRESTOR) 20 MG tablet, Take 20 mg by mouth daily., Disp: , Rfl:    famotidine (PEPCID) 20 MG tablet, Take 1 tablet (20 mg total) by mouth 2 (two) times daily., Disp: 180 tablet, Rfl: 1   Allergies  Allergen Reactions   Pravastatin Hives   Haemophilus Influenzae Vaccines     Nausea, pain, body weakness   Sulfa Antibiotics Itching   Sulfonamide Derivatives Itching   Tetanus Toxoids Swelling    Arm swelled at injection site     Review of Systems  Constitutional: Negative.   Respiratory: Negative.  Negative for shortness of breath.   Cardiovascular: Negative.  Negative for palpitations.  Musculoskeletal:  Positive for arthralgias.       She c/o lump on her wrist. She first noticed a month ago. It seems to come and go. States it is sometimes sore. Denies fall/trauma. She is not sure what may have triggered her sx. Reports having previous CTS surgery on this hand.   Neurological: Negative.   Psychiatric/Behavioral: Negative.       Today's Vitals   02/18/22 1221  BP: 122/80  Temp: 98.1 F (36.7 C)  SpO2: 98%  Weight: 210 lb 12.8 oz (95.6 kg)  Height: _0  (1.575 m)  PainSc: 0-No pain   Body mass index  is 38.56 kg/m.  Wt Readings from Last 3 Encounters:  02/18/22 210 lb 12.8 oz (95.6 kg)  01/14/22 213 lb 12.8 oz (97 kg)  11/29/21 209 lb 6.4 oz (95 kg)    Objective:  Physical Exam Vitals and nursing note reviewed.  Constitutional:      Appearance: Normal appearance. She is obese.  HENT:     Head: Normocephalic and atraumatic.  Eyes:     Extraocular Movements: Extraocular movements intact.  Cardiovascular:     Rate and Rhythm: Normal rate and regular rhythm.  Heart sounds: Normal heart sounds.  Pulmonary:     Effort: Pulmonary effort is normal.     Breath sounds: Normal breath sounds.  Musculoskeletal:     Cervical back: Normal range of motion.  Skin:    General: Skin is warm.  Neurological:     General: No focal deficit present.     Mental Status: She is alert.  Psychiatric:        Mood and Affect: Mood normal.        Behavior: Behavior normal.      Assessment And Plan:     1. Hypertensive heart disease without heart failure Comments: Chronic, she has tolerated daily losartan 46m daily, as well as carvedilol 3.230mbid and hctz 12.51m69maily. I will check renal function today. - BMP8+eGFR  2. Abdominal aortic atherosclerosis (HCC) Comments: Chronic, LDL goal <70. She will c/w rosuvastatin 21m66mily and ASA 81mg83mly.   3. Class 2 obesity due to excess calories without serious comorbidity with body mass index (BMI) of 38.0 to 38.9 in adult She is encouraged to strive for BMI less than 30 to decrease cardiac risk. Advised to aim for at least 150 minutes of exercise per week.    Patient was given opportunity to ask questions. Patient verbalized understanding of the plan and was able to repeat key elements of the plan. All questions were answered to their satisfaction.   I, RobynMaximino [name] have reviewed all documentation for this visit. The documentation on 02/23/22 for the exam, diagnosis, procedures, and orders are all accurate and complete.   IF YOU HAVE  BEEN REFERRED TO A SPECIALIST, IT MAY TAKE 1-2 WEEKS TO SCHEDULE/PROCESS THE REFERRAL. IF YOU HAVE NOT HEARD FROM US/SPECIALIST IN TWO WEEKS, PLEASE GIVE US A [place] AT [phone].   THE PATIENT IS ENCOURAGED TO PRACTICE SOCIAL DISTANCING DUE TO THE COVID-19 PANDEMIC.

## 2022-02-19 LAB — BMP8+EGFR
BUN/Creatinine Ratio: 12 (ref 12–28)
BUN: 8 mg/dL (ref 8–27)
CO2: 23 mmol/L (ref 20–29)
Calcium: 9.1 mg/dL (ref 8.7–10.3)
Chloride: 106 mmol/L (ref 96–106)
Creatinine, Ser: 0.67 mg/dL (ref 0.57–1.00)
Glucose: 95 mg/dL (ref 70–99)
Potassium: 4.3 mmol/L (ref 3.5–5.2)
Sodium: 143 mmol/L (ref 134–144)
eGFR: 90 mL/min/{1.73_m2} (ref >59)

## 2022-03-04 DIAGNOSIS — I10 Essential (primary) hypertension: Secondary | ICD-10-CM | POA: Diagnosis not present

## 2022-03-04 DIAGNOSIS — R0602 Shortness of breath: Secondary | ICD-10-CM | POA: Diagnosis not present

## 2022-03-04 DIAGNOSIS — G4733 Obstructive sleep apnea (adult) (pediatric): Secondary | ICD-10-CM | POA: Diagnosis not present

## 2022-03-04 DIAGNOSIS — R0600 Dyspnea, unspecified: Secondary | ICD-10-CM | POA: Diagnosis not present

## 2022-03-10 ENCOUNTER — Other Ambulatory Visit: Payer: Self-pay | Admitting: Internal Medicine

## 2022-03-19 ENCOUNTER — Telehealth: Payer: Medicare HMO

## 2022-03-21 ENCOUNTER — Other Ambulatory Visit: Payer: Self-pay

## 2022-03-21 ENCOUNTER — Ambulatory Visit (INDEPENDENT_AMBULATORY_CARE_PROVIDER_SITE_OTHER): Payer: Medicare HMO | Admitting: Allergy

## 2022-03-21 ENCOUNTER — Encounter: Payer: Self-pay | Admitting: Allergy

## 2022-03-21 VITALS — BP 128/70 | HR 63 | Resp 17

## 2022-03-21 DIAGNOSIS — K219 Gastro-esophageal reflux disease without esophagitis: Secondary | ICD-10-CM | POA: Diagnosis not present

## 2022-03-21 DIAGNOSIS — J3089 Other allergic rhinitis: Secondary | ICD-10-CM

## 2022-03-21 DIAGNOSIS — R058 Other specified cough: Secondary | ICD-10-CM

## 2022-03-21 MED ORDER — AZELASTINE HCL 0.1 % NA SOLN: NASAL | 5 refills | Status: DC

## 2022-03-21 NOTE — Patient Instructions (Addendum)
-  Lung function testing remains normal today -Environmental allergy testing is positive to dust mite, grass pollen, tree pollen, weed pollen, cockroach, molds.  Allergen avoidance measures provided.  Would perform dust mite avoidance at very minimum   Continue Allegra (fexofenadine) 180mg  tablet once daily..   Continue Singulair (montelukast) 10mg  daily.  This is an antileukotriene that helps with allergy and respiratory symptoms.  It can work alongside antihistamine for better symptom control.  If you notice any change in mood/behavior/sleep after starting Singulair then stop this medication and let us know.  Symptoms resolve after stopping the medication.   Continue Atrovent (ipratropium) 0.06% one spray per nostril 3-4 times daily as needed for drainage or congestion. Start Astelin 2 sprays each nostril twice a day for drainage control   You can use an extra dose of the antihistamine, if needed, for breakthrough symptoms.  Stop Pepcid and replace with a PPI like Nexium, Prilosec or Protonix daily for reflux control Recommend discussing with Dr Baird Cancer your Cozaar (Losartan) as this medication can cause cough as a side effect  Follow-up in 3 months or sooner if needed

## 2022-03-21 NOTE — Progress Notes (Signed)
Follow-up Note  RE: CASSANDA [name] MRN: [id number] DOB: 05/18/1946 Date of Office Visit: 03/21/2022   History of present illness: [name] is a 76 y.o. female presenting today for follow-up of upper airway cough syndrome/rhinitis.  She was last seen in the office on 11/29/2021 by myself.  She states her cough symptoms have not really improved.  She was coughing throughout the day.  She can feel the drainage coming from above and coming from below in her chest.  She can cough and spit out the mucus.  It affects her talking if she talks too long, she can start to hear the rattling in the throat.  The Allegra may be a little more effective than Claritin but not much.  She is taking Singulair as well.  She is also using the nasal Atrovent about 3 times a day and does find it to be helpful but it does not last long.  She is also concerned maybe a lot of her medications could be causing her to cough.  She is on losartan.  She states within the past month or so her Pepcid dose was decreased from 40 mg to 20 mg but she has not really seen a change in her cough with decreasing the dose down.  She does recall taking a PPI many years ago but she is not currently on a PPI at this time.  Review of systems: Review of Systems  Constitutional: Negative.   HENT:  Positive for postnasal drip and voice change.   Eyes: Negative.   Respiratory:  Positive for cough.   Cardiovascular: Negative.   Gastrointestinal: Negative.   Musculoskeletal: Negative.   Skin: Negative.   Allergic/Immunologic: Negative.   Neurological: Negative.      All other systems negative unless noted above in HPI  Past medical/social/surgical/family history have been reviewed and are unchanged unless specifically indicated below.  No changes  Medication List: Current Outpatient Medications  Medication Sig Dispense Refill   albuterol (VENTOLIN HFA) 108 (90 Base) MCG/ACT inhaler Inhale 2 puffs into the lungs every 6 (six)  hours as needed for wheezing or shortness of breath. 18 g 2   Ascorbic Acid (VITAMIN C) 1000 MG tablet Take 1,000 mg by mouth daily.     aspirin EC 81 MG tablet Take 1 tablet (81 mg total) by mouth 2 (two) times daily. 60 tablet 0   azelastine (ASTELIN) 0.1 % nasal spray 2 sprays each nostril 2 times daily for drainage control. 30 mL 5   Bacillus Coagulans-Inulin (PROBIOTIC FORMULA PO) Take 1 capsule by mouth 2 (two) times a week.     carvedilol (COREG) 3.125 MG tablet Take 1 tablet by mouth twice daily 180 tablet 0   cetirizine (ZYRTEC) 10 MG tablet Take 10 mg by mouth daily as needed for allergies.     Cholecalciferol (VITAMIN D) 50 MCG (2000 UT) tablet Take 2,000 Units by mouth daily.     clotrimazole-betamethasone (LOTRISONE) cream Apply topically 2 (two) times daily as needed. (Patient taking differently: Apply 1 application  topically 2 (two) times daily as needed (eczema).) 30 g 1   Coenzyme Q10 (COQ10) 100 MG CAPS Take 100 mg by mouth daily with lunch.      estradiol (ESTRACE) 0.1 MG/GM vaginal cream Place 1 Applicatorful vaginally daily as needed.     fluticasone (FLONASE) 50 MCG/ACT nasal spray Place 1 spray into both nostrils daily as needed for allergies or rhinitis.     furosemide (LASIX) 20 MG  tablet Take 1 tablet (20 mg total) by mouth daily as needed (fluid). 90 tablet 1   hydrochlorothiazide (MICROZIDE) 12.5 MG capsule Take 12.5 mg by mouth daily as needed (excess fluid/swelling).      ipratropium (ATROVENT) 0.06 % nasal spray Place 2 sprays into both nostrils 3 (three) times daily as needed for rhinitis. 15 mL 3   loratadine (CLARITIN) 10 MG tablet Take 10 mg by mouth daily as needed for allergies.     losartan (COZAAR) 25 MG tablet Take one tablet (25 mg) by mouth daily 90 tablet 1   meloxicam (MOBIC) 15 MG tablet Take 15 mg by mouth daily.     metFORMIN (GLUCOPHAGE) 500 MG tablet Take 1 tablet (500 mg total) by mouth 2 (two) times daily with a meal. 180 tablet 2   OneTouch  Delica Lancets 62I MISC USE   TO CHECK GLUCOSE BEFORE BREAKFAST AND  DINNER 100 each 0   ONETOUCH VERIO test strip USE 1 STRIP TO CHECK GLUCOSE TWICE DAILY 100 each 0   oxyCODONE-acetaminophen (PERCOCET/ROXICET) 5-325 MG tablet Take 1 tablet by mouth every 4 (four) hours as needed for severe pain. 30 tablet 0   OXYGEN Inhale 1 L into the lungs at bedtime as needed (oxygen).     potassium chloride SA (KLOR-CON M) 20 MEQ tablet TAKE 1 TABLET BY MOUTH ONCE DAILY AS NEEDED WITH EACH DOSE OF HYDROCHLOROTHIAZIDE 90 tablet 0   rosuvastatin (CRESTOR) 20 MG tablet Take 20 mg by mouth daily.     benzonatate (TESSALON PERLES) 100 MG capsule Take 1 capsule (100 mg total) by mouth 3 (three) times daily as needed for cough. (Patient not taking: Reported on 03/21/2022) 30 capsule 1   famotidine (PEPCID) 20 MG tablet Take 1 tablet (20 mg total) by mouth 2 (two) times daily. 180 tablet 1   No current facility-administered medications for this visit.     Known medication allergies: Allergies  Allergen Reactions   Pravastatin Hives   Haemophilus Influenzae Vaccines     Nausea, pain, body weakness   Sulfa Antibiotics Itching   Sulfonamide Derivatives Itching   Tetanus Toxoid, Adsorbed Other (See Comments)   Tetanus Toxoids Swelling    Arm swelled at injection site     Physical examination: Blood pressure 128/70, pulse 63, resp. rate 17, SpO2 97 %.  General: Alert, interactive, in no acute distress. HEENT: PERRLA, TMs pearly gray, turbinates minimally edematous with clear discharge, post-pharynx non erythematous. Neck: Supple without lymphadenopathy. Lungs: Clear to auscultation without wheezing, rhonchi or rales. {no increased work of breathing. CV: Normal S1, S2 without murmurs. Abdomen: Nondistended, nontender. Skin: Warm and dry, without lesions or rashes. Extremities:  No clubbing, cyanosis or edema. Neuro:   Grossly intact.  Diagnositics/Labs: Labs:  Component     Latest Ref Rng 11/29/2021   IgE (Immunoglobulin E), Serum     6 - 495 IU/mL 559 (H)   D Pteronyssinus IgE     Class 0 kU/L <0.10   D Farinae IgE     Class 0/I kU/L 0.13 !   Cat Dander IgE     Class 0 kU/L <0.10   Dog Dander IgE     Class 0 kU/L <0.10   [place] Grass IgE     Class 0 kU/L <0.10   Timothy Grass IgE     Class 0 kU/L <0.10   [place] Grass IgE     Class 0/I kU/L 0.12 !   Cockroach, [place] IgE     Class  0/I kU/L 0.12 !   Penicillium Chrysogen IgE     Class 0/I kU/L 0.12 !   Cladosporium Herbarum IgE     Class 0 kU/L <0.10   Aspergillus Fumigatus IgE     Class 0/I kU/L 0.13 !   Alternaria Alternata IgE     Class 0 kU/L <0.10   Maple/Box Elder IgE     Class 0/I kU/L 0.15 !   Common Silver Charletta Cousin IgE     Class 0/I kU/L 0.13 !   [place], [place] IgE     Class 0/I kU/L 0.11 !   [place], [place] IgE     Class 0/I kU/L 0.16 !   Elm, American IgE     Class I kU/L 0.55 !   [place] IgE     Class I kU/L 0.36 !   Pecan, [place] IgE     Class I kU/L 0.43 !   White Mulberry IgE     Class 0 kU/L <0.10   Ragweed, Short IgE     Class 0/I kU/L 0.22 !   Pigweed, Rough IgE     Class 0/I kU/L 0.11 !   Sheep Sorrel IgE Qn     Class 0/I kU/L 0.22 !   Mouse Urine IgE     Class 0 kU/L <0.10     Spirometry: FEV1: 1.73L 104%, FVC: 2.02L 94%, ratio consistent with obstructive pattern  Assessment and plan: Upper airway cough syndrome Allergic rhinitis LPRD  -Lung function testing remains normal today -Environmental allergy testing is positive to dust mite, grass pollen, tree pollen, weed pollen, cockroach, molds.  Allergen avoidance measures provided.  Would perform dust mite avoidance at very minimum   Continue Allegra (fexofenadine) 180mg  tablet once daily..   Continue Singulair (montelukast) 10mg  daily.  This is an antileukotriene that helps with allergy and respiratory symptoms.  It can work alongside antihistamine for better symptom control.  If you notice any change in mood/behavior/sleep after  starting Singulair then stop this medication and let know.  Symptoms resolve after stopping the medication.   Continue Atrovent (ipratropium) 0.06% one spray per nostril 3-4 times daily as needed for drainage or congestion. Start Astelin 2 sprays each nostril twice a day for drainage control   You can use an extra dose of the antihistamine, if needed, for breakthrough symptoms.  Stop Pepcid and replace with a PPI like Nexium, Prilosec or Protonix daily for reflux control Recommend discussing with Dr your Cozaar (Losartan) as this medication can cause cough as a side effect  Follow-up in 3 months or sooner if needed  I appreciate the opportunity to take part in Zaraya's care. Please do not hesitate to contact me with questions.  Sincerely,   [place], MD Allergy/Immunology [facility]

## 2022-03-29 ENCOUNTER — Ambulatory Visit: Payer: Medicare HMO | Admitting: Allergy

## 2022-04-02 DIAGNOSIS — L299 Pruritus, unspecified: Secondary | ICD-10-CM | POA: Diagnosis not present

## 2022-04-02 DIAGNOSIS — H6993 Unspecified Eustachian tube disorder, bilateral: Secondary | ICD-10-CM | POA: Diagnosis not present

## 2022-04-02 DIAGNOSIS — J302 Other seasonal allergic rhinitis: Secondary | ICD-10-CM | POA: Diagnosis not present

## 2022-04-03 DIAGNOSIS — G4733 Obstructive sleep apnea (adult) (pediatric): Secondary | ICD-10-CM | POA: Diagnosis not present

## 2022-04-03 DIAGNOSIS — R0600 Dyspnea, unspecified: Secondary | ICD-10-CM | POA: Diagnosis not present

## 2022-04-03 DIAGNOSIS — R0602 Shortness of breath: Secondary | ICD-10-CM | POA: Diagnosis not present

## 2022-04-03 DIAGNOSIS — I10 Essential (primary) hypertension: Secondary | ICD-10-CM | POA: Diagnosis not present

## 2022-05-04 DIAGNOSIS — I10 Essential (primary) hypertension: Secondary | ICD-10-CM | POA: Diagnosis not present

## 2022-05-04 DIAGNOSIS — G4733 Obstructive sleep apnea (adult) (pediatric): Secondary | ICD-10-CM | POA: Diagnosis not present

## 2022-05-04 DIAGNOSIS — R0602 Shortness of breath: Secondary | ICD-10-CM | POA: Diagnosis not present

## 2022-05-04 DIAGNOSIS — R0600 Dyspnea, unspecified: Secondary | ICD-10-CM | POA: Diagnosis not present

## 2022-05-17 ENCOUNTER — Other Ambulatory Visit: Payer: Self-pay | Admitting: Cardiology

## 2022-05-21 ENCOUNTER — Other Ambulatory Visit: Payer: Self-pay | Admitting: Internal Medicine

## 2022-05-21 ENCOUNTER — Encounter: Payer: Self-pay | Admitting: Internal Medicine

## 2022-05-21 ENCOUNTER — Ambulatory Visit (INDEPENDENT_AMBULATORY_CARE_PROVIDER_SITE_OTHER): Payer: Medicare HMO | Admitting: Internal Medicine

## 2022-05-21 VITALS — BP 128/66 | HR 60 | Temp 98.0°F | Ht 62.0 in | Wt 213.2 lb

## 2022-05-21 DIAGNOSIS — Z6838 Body mass index (BMI) 38.0-38.9, adult: Secondary | ICD-10-CM

## 2022-05-21 DIAGNOSIS — E1165 Type 2 diabetes mellitus with hyperglycemia: Secondary | ICD-10-CM

## 2022-05-21 DIAGNOSIS — I119 Hypertensive heart disease without heart failure: Secondary | ICD-10-CM

## 2022-05-21 DIAGNOSIS — R053 Chronic cough: Secondary | ICD-10-CM | POA: Diagnosis not present

## 2022-05-21 DIAGNOSIS — Z2821 Immunization not carried out because of patient refusal: Secondary | ICD-10-CM

## 2022-05-21 MED ORDER — PANTOPRAZOLE SODIUM 40 MG PO TBEC: 40.0000 mg | DELAYED_RELEASE_TABLET | Freq: Every day | ORAL | 1 refills | Status: DC

## 2022-05-21 MED ORDER — VALSARTAN 80 MG PO TABS: 80.0000 mg | ORAL_TABLET | Freq: Every day | ORAL | 11 refills | Status: DC

## 2022-05-21 MED ORDER — HYDROCODONE BIT-HOMATROP MBR 5-1.5 MG/5ML PO SOLN: 5.0000 mL | Freq: Four times a day (QID) | ORAL | 0 refills | Status: DC | PRN

## 2022-05-21 MED ORDER — CARVEDILOL 3.125 MG PO TABS: 3.1250 mg | ORAL_TABLET | Freq: Two times a day (BID) | ORAL | 1 refills | Status: DC

## 2022-05-21 MED ORDER — BENZONATATE 100 MG PO CAPS: 100.0000 mg | ORAL_CAPSULE | Freq: Three times a day (TID) | ORAL | 1 refills | Status: DC | PRN

## 2022-05-21 NOTE — Patient Instructions (Signed)

## 2022-05-21 NOTE — Progress Notes (Signed)
I,Ciera J Martin,acting as a scribe for Robyn N Sanders, MD.,have documented all relevant documentation on the behalf of Robyn N Sanders, MD,as directed by  Robyn N Sanders, MD while in the presence of Robyn N Sanders, MD.    Subjective:     Patient ID: [name] , female    DOB: 04/17/1946 , 76 y.o.   MRN: [id number]   Chief Complaint  Patient presents with   Diabetes   Hypertension    HPI  The patient is here today for a diabetes and blood pressure f/u. She reports compliance with meds. She reports she has been more active. She denies chest pain, shortness of breath. Patient states she is still having her sinus cough, she has went to the allergist but states the cough has been going on too long.  BP Readings from Last 3 Encounters: 05/21/22 : 128/66 03/21/22 : 128/70 02/18/22 : 122/80    Diabetes She presents for her follow-up diabetic visit. She has type 2 diabetes mellitus. Her disease course has been stable. There are no hypoglycemic associated symptoms. Pertinent negatives for diabetes include no blurred vision and no chest pain. There are no hypoglycemic complications. Risk factors for coronary artery disease include dyslipidemia, diabetes mellitus, hypertension, obesity, post-menopausal and sedentary lifestyle. She is following a generally healthy diet. Her home blood glucose trend is fluctuating minimally. Her breakfast blood glucose is taken between 8-9 am. Her breakfast blood glucose range is generally 110-130 mg/dl. An ACE inhibitor/angiotensin II receptor blocker is being taken. Eye exam is current.  Hypertension This is a chronic problem. The current episode started more than 1 year ago. The problem has been gradually improving since onset. The problem is controlled. Pertinent negatives include no blurred vision, chest pain, palpitations or shortness of breath. Risk factors for coronary artery disease include diabetes mellitus, dyslipidemia, obesity, post-menopausal  state and sedentary lifestyle. Past treatments include angiotensin blockers and diuretics.     Past Medical History:  Diagnosis Date   Arthritis    hands   Bladder infection    Carpal tunnel syndrome    Complication of anesthesia    Difficult to arouse   Diabetes mellitus    GERD (gastroesophageal reflux disease)    Hypertension    Sleep apnea      Family History  Problem Relation Age of Onset   Diabetes Mother    Hypertension Mother    Asthma Father    Heart attack Father 47   Stroke Father 70   Diabetes Father    Breast cancer Sister 58   Allergic rhinitis Brother    Diabetes Brother    Diabetes Brother    Atopy Paternal Grandmother      Current Outpatient Medications:    albuterol (VENTOLIN HFA) 108 (90 Base) MCG/ACT inhaler, Inhale 2 puffs into the lungs every 6 (six) hours as needed for wheezing or shortness of breath., Disp: 18 g, Rfl: 2   Ascorbic Acid (VITAMIN C) 1000 MG tablet, Take 1,000 mg by mouth daily., Disp: , Rfl:    aspirin EC 81 MG tablet, Take 1 tablet (81 mg total) by mouth 2 (two) times daily., Disp: 60 tablet, Rfl: 0   azelastine (ASTELIN) 0.1 % nasal spray, 2 sprays each nostril 2 times daily for drainage control., Disp: 30 mL, Rfl: 5   Bacillus Coagulans-Inulin (PROBIOTIC FORMULA PO), Take 1 capsule by mouth 2 (two) times a week., Disp: , Rfl:    cetirizine (ZYRTEC) 10 MG tablet, Take 10 mg by   mouth daily as needed for allergies., Disp: , Rfl:    Cholecalciferol (VITAMIN D) 50 MCG (2000 UT) tablet, Take 2,000 Units by mouth daily., Disp: , Rfl:    clotrimazole-betamethasone (LOTRISONE) cream, Apply topically 2 (two) times daily as needed. (Patient taking differently: Apply 1 application  topically 2 (two) times daily as needed (eczema).), Disp: 30 g, Rfl: 1   Coenzyme Q10 (COQ10) 100 MG CAPS, Take 100 mg by mouth daily with lunch. , Disp: , Rfl:    estradiol (ESTRACE) 0.1 MG/GM vaginal cream, Place 1 Applicatorful vaginally daily as needed., Disp: ,  Rfl:    famotidine (PEPCID) 20 MG tablet, Take 1 tablet (20 mg total) by mouth 2 (two) times daily., Disp: 180 tablet, Rfl: 1   fluticasone (FLONASE) 50 MCG/ACT nasal spray, Place 1 spray into both nostrils daily as needed for allergies or rhinitis., Disp: , Rfl:    furosemide (LASIX) 20 MG tablet, Take 1 tablet (20 mg total) by mouth daily as needed (fluid)., Disp: 90 tablet, Rfl: 1   hydrochlorothiazide (MICROZIDE) 12.5 MG capsule, Take 12.5 mg by mouth daily as needed (excess fluid/swelling). , Disp: , Rfl:    HYDROcodone bit-homatropine (HYDROMET) 5-1.5 MG/5ML syrup, Take 5 mLs by mouth every 6 (six) hours as needed., Disp: 120 mL, Rfl: 0   ipratropium (ATROVENT) 0.06 % nasal spray, Place 2 sprays into both nostrils 3 (three) times daily as needed for rhinitis., Disp: 15 mL, Rfl: 3   loratadine (CLARITIN) 10 MG tablet, Take 10 mg by mouth daily as needed for allergies., Disp: , Rfl:    meloxicam (MOBIC) 15 MG tablet, Take 15 mg by mouth daily., Disp: , Rfl:    metFORMIN (GLUCOPHAGE) 500 MG tablet, Take 1 tablet (500 mg total) by mouth 2 (two) times daily with a meal., Disp: 180 tablet, Rfl: 2   OneTouch Delica Lancets 32K MISC, USE   TO CHECK GLUCOSE BEFORE BREAKFAST AND  DINNER, Disp: 100 each, Rfl: 0   ONETOUCH VERIO test strip, USE 1 STRIP TO CHECK GLUCOSE TWICE DAILY, Disp: 100 each, Rfl: 0   OXYGEN, Inhale 1 L into the lungs at bedtime as needed (oxygen)., Disp: , Rfl:    pantoprazole (PROTONIX) 40 MG tablet, Take 1 tablet (40 mg total) by mouth daily., Disp: 30 tablet, Rfl: 1   potassium chloride SA (KLOR-CON M) 20 MEQ tablet, TAKE 1 TABLET BY MOUTH ONCE DAILY AS NEEDED WITH EACH DOSE OF HYDROCHLOROTHIAZIDE, Disp: 90 tablet, Rfl: 0   rosuvastatin (CRESTOR) 20 MG tablet, Take 20 mg by mouth daily., Disp: , Rfl:    valsartan (DIOVAN) 80 MG tablet, Take 1 tablet (80 mg total) by mouth daily., Disp: 30 tablet, Rfl: 11   benzonatate (TESSALON PERLES) 100 MG capsule, Take 1 capsule (100 mg  total) by mouth 3 (three) times daily as needed for cough., Disp: 30 capsule, Rfl: 1   carvedilol (COREG) 3.125 MG tablet, Take 1 tablet (3.125 mg total) by mouth 2 (two) times daily., Disp: 180 tablet, Rfl: 1   Allergies  Allergen Reactions   Pravastatin Hives   Haemophilus Influenzae Vaccines     Nausea, pain, body weakness   Sulfa Antibiotics Itching   Sulfonamide Derivatives Itching   Tetanus Toxoid, Adsorbed Other (See Comments)   Tetanus Toxoids Swelling    Arm swelled at injection site     Review of Systems  Constitutional: Negative.   HENT: Negative.    Eyes: Negative.  Negative for blurred vision.  Respiratory:  Positive for  cough. Negative for shortness of breath.   Cardiovascular: Negative.  Negative for chest pain and palpitations.  Gastrointestinal: Negative.      Today's Vitals   05/21/22 1155  BP: 128/66  Pulse: 60  Temp: 98 F (36.7 C)  TempSrc: Oral  Weight: 213 lb 3.2 oz (96.7 kg)  Height: 5' 2" (1.575 m)  PainSc: 0-No pain   Body mass index is 38.99 kg/m.  Wt Readings from Last 3 Encounters:  05/21/22 213 lb 3.2 oz (96.7 kg)  02/18/22 210 lb 12.8 oz (95.6 kg)  01/14/22 213 lb 12.8 oz (97 kg)    Objective:  Physical Exam Vitals and nursing note reviewed.  Constitutional:      Appearance: Normal appearance.  HENT:     Head: Normocephalic and atraumatic.     Nose:     Comments: Masked     Mouth/Throat:     Comments: Masked  Eyes:     Extraocular Movements: Extraocular movements intact.  Cardiovascular:     Rate and Rhythm: Normal rate and regular rhythm.     Heart sounds: Normal heart sounds.  Pulmonary:     Effort: Pulmonary effort is normal.     Breath sounds: Normal breath sounds.  Musculoskeletal:     Cervical back: Normal range of motion.  Skin:    General: Skin is warm.  Neurological:     General: No focal deficit present.     Mental Status: She is alert.  Psychiatric:        Mood and Affect: Mood normal.        Behavior:  Behavior normal.         Assessment And Plan:     1. Uncontrolled type 2 diabetes mellitus with hyperglycemia (HCC) Comments: Chronic, I will check labs as below. She is encouraged to aim for at least 150 minutes of exercise per week.  She agrees to rto in 4 months for re-evaluation.  - Hemoglobin A1c - CMP14+EGFR  2. Hypertensive heart disease without heart failure Comments: Chronic, well controlled.  She is encouraged to limit her intake of processed meats and foods. - CMP14+EGFR - BMP8+eGFR; Future  3. Chronic cough Comments: Chronic, add pantoprazole 40mg qam, c/w famotidine qpm. I will also d/c losartan and send rx valsartan 80mg. F/u 2 wks for nurse visit, will check BMP then.  She agrees to Pulmonary referral for further evaluation of chronic cough. CXR Nov 2022 was normal. Reminded to avoid dairy products as well. May need to consider gabapentin or tramadol nightly to help with cough.  - Ambulatory referral to Pulmonology  4. Class 2 severe obesity due to excess calories with serious comorbidity and body mass index (BMI) of 38.0 to 38.9 in adult (HCC) Comments: She is encouraged to aim for at least 150 minutes of exercise per week, while striving for BMI<30 to decrease cardiac risk.  5. Herpes zoster vaccination declined   Patient was given opportunity to ask questions. Patient verbalized understanding of the plan and was able to repeat key elements of the plan. All questions were answered to their satisfaction.   I, Robyn N Sanders, MD, have reviewed all documentation for this visit. The documentation on 05/21/22 for the exam, diagnosis, procedures, and orders are all accurate and complete.   IF YOU HAVE BEEN REFERRED TO A SPECIALIST, IT MAY TAKE 1-2 WEEKS TO SCHEDULE/PROCESS THE REFERRAL. IF YOU HAVE NOT HEARD FROM US/SPECIALIST IN TWO WEEKS, PLEASE GIVE US A CALL AT [phone].     THE PATIENT IS ENCOURAGED TO PRACTICE SOCIAL DISTANCING DUE TO THE COVID-19 PANDEMIC.    

## 2022-05-22 LAB — CMP14+EGFR
ALT: 9 IU/L (ref 0–32)
AST: 14 IU/L (ref 0–40)
Albumin/Globulin Ratio: 1.6 (ref 1.2–2.2)
Albumin: 4.3 g/dL (ref 3.8–4.8)
Alkaline Phosphatase: 94 IU/L (ref 44–121)
BUN/Creatinine Ratio: 15 (ref 12–28)
BUN: 12 mg/dL (ref 8–27)
Bilirubin Total: 0.4 mg/dL (ref 0.0–1.2)
CO2: 23 mmol/L (ref 20–29)
Calcium: 9.4 mg/dL (ref 8.7–10.3)
Chloride: 105 mmol/L (ref 96–106)
Creatinine, Ser: 0.78 mg/dL (ref 0.57–1.00)
Globulin, Total: 2.7 g/dL (ref 1.5–4.5)
Glucose: 106 mg/dL — ABNORMAL HIGH (ref 70–99)
Potassium: 4.4 mmol/L (ref 3.5–5.2)
Sodium: 143 mmol/L (ref 134–144)
Total Protein: 7 g/dL (ref 6.0–8.5)
eGFR: 78 mL/min/{1.73_m2} (ref >59)

## 2022-05-22 LAB — HEMOGLOBIN A1C
Est. average glucose Bld gHb Est-mCnc: 128 mg/dL
Hgb A1c MFr Bld: 6.1 % — ABNORMAL HIGH (ref 4.8–5.6)

## 2022-06-03 DIAGNOSIS — R0602 Shortness of breath: Secondary | ICD-10-CM | POA: Diagnosis not present

## 2022-06-03 DIAGNOSIS — I10 Essential (primary) hypertension: Secondary | ICD-10-CM | POA: Diagnosis not present

## 2022-06-03 DIAGNOSIS — G4733 Obstructive sleep apnea (adult) (pediatric): Secondary | ICD-10-CM | POA: Diagnosis not present

## 2022-06-03 DIAGNOSIS — R0600 Dyspnea, unspecified: Secondary | ICD-10-CM | POA: Diagnosis not present

## 2022-06-04 ENCOUNTER — Ambulatory Visit: Payer: Medicare HMO

## 2022-06-04 VITALS — BP 126/66 | HR 79 | Temp 97.7°F

## 2022-06-04 DIAGNOSIS — Z79899 Other long term (current) drug therapy: Secondary | ICD-10-CM

## 2022-06-04 MED ORDER — VALSARTAN-HYDROCHLOROTHIAZIDE 80-12.5 MG PO TABS: 1.0000 | ORAL_TABLET | Freq: Every day | ORAL | 1 refills | Status: DC

## 2022-06-04 NOTE — Patient Instructions (Signed)
Hypertension, Adult High blood pressure (hypertension) is when the force of blood pumping through the arteries is too strong. The arteries are the blood vessels that carry blood from the heart throughout the body. Hypertension forces the heart to work harder to pump blood and may cause arteries to become narrow or stiff. Untreated or uncontrolled hypertension can lead to a heart attack, heart failure, a stroke, kidney disease, and other problems. A blood pressure reading consists of a higher number over a lower number. Ideally, your blood pressure should be below 120/80. The first ("top") number is called the systolic pressure. It is a measure of the pressure in your arteries as your heart beats. The second ("bottom") number is called the diastolic pressure. It is a measure of the pressure in your arteries as the heart relaxes. What are the causes? The exact cause of this condition is not known. There are some conditions that result in high blood pressure. What increases the risk? Certain factors may make you more likely to develop high blood pressure. Some of these risk factors are under your control, including: Smoking. Not getting enough exercise or physical activity. Being overweight. Having too much fat, sugar, calories, or salt (sodium) in your diet. Drinking too much alcohol. Other risk factors include: Having a personal history of heart disease, diabetes, high cholesterol, or kidney disease. Stress. Having a family history of high blood pressure and high cholesterol. Having obstructive sleep apnea. Age. The risk increases with age. What are the signs or symptoms? High blood pressure may not cause symptoms. Very high blood pressure (hypertensive crisis) may cause: Headache. Fast or irregular heartbeats (palpitations). Shortness of breath. Nosebleed. Nausea and vomiting. Vision changes. Severe chest pain, dizziness, and seizures. How is this diagnosed? This condition is diagnosed by  measuring your blood pressure while you are seated, with your arm resting on a flat surface, your legs uncrossed, and your feet flat on the floor. The cuff of the blood pressure monitor will be placed directly against the skin of your upper arm at the level of your heart. Blood pressure should be measured at least twice using the same arm. Certain conditions can cause a difference in blood pressure between your right and left arms. If you have a high blood pressure reading during one visit or you have normal blood pressure with other risk factors, you may be asked to: Return on a different day to have your blood pressure checked again. Monitor your blood pressure at home for 1 week or longer. If you are diagnosed with hypertension, you may have other blood or imaging tests to help your health care provider understand your overall risk for other conditions. How is this treated? This condition is treated by making healthy lifestyle changes, such as eating healthy foods, exercising more, and reducing your alcohol intake. You may be referred for counseling on a healthy diet and physical activity. Your health care provider may prescribe medicine if lifestyle changes are not enough to get your blood pressure under control and if: Your systolic blood pressure is above 130. Your diastolic blood pressure is above 80. Your personal target blood pressure may vary depending on your medical conditions, your age, and other factors. Follow these instructions at home: Eating and drinking  Eat a diet that is high in fiber and potassium, and low in sodium, added sugar, and fat. An example of this eating plan is called the DASH diet. DASH stands for Dietary Approaches to Stop Hypertension. To eat this way: Eat   plenty of fresh fruits and vegetables. Try to fill one half of your plate at each meal with fruits and vegetables. Eat whole grains, such as whole-wheat pasta, brown rice, or whole-grain bread. Fill about one  fourth of your plate with whole grains. Eat or drink low-fat dairy products, such as skim milk or low-fat yogurt. Avoid fatty cuts of meat, processed or cured meats, and poultry with skin. Fill about one fourth of your plate with lean proteins, such as fish, chicken without skin, beans, eggs, or tofu. Avoid pre-made and processed foods. These tend to be higher in sodium, added sugar, and fat. Reduce your daily sodium intake. Many people with hypertension should eat less than 1,500 mg of sodium a day. Do not drink alcohol if: Your health care provider tells you not to drink. You are pregnant, may be pregnant, or are planning to become pregnant. If you drink alcohol: Limit how much you have to: 0-1 drink a day for women. 0-2 drinks a day for men. Know how much alcohol is in your drink. In the [place] one drink equals one 12 oz bottle of beer (355 mL), one 5 oz glass of wine (148 mL), or one 1 oz glass of hard liquor (44 mL). Lifestyle  Work with your health care provider to maintain a healthy body weight or to lose weight. Ask what an ideal weight is for you. Get at least 30 minutes of exercise that causes your heart to beat faster (aerobic exercise) most days of the week. Activities may include walking, swimming, or biking. Include exercise to strengthen your muscles (resistance exercise), such as Pilates or lifting weights, as part of your weekly exercise routine. Try to do these types of exercises for 30 minutes at least 3 days a week. Do not use any products that contain nicotine or tobacco. These products include cigarettes, chewing tobacco, and vaping devices, such as e-cigarettes. If you need help quitting, ask your health care provider. Monitor your blood pressure at home as told by your health care provider. Keep all follow-up visits. This is important. Medicines Take over-the-counter and prescription medicines only as told by your health care provider. Follow directions carefully. Blood  pressure medicines must be taken as prescribed. Do not skip doses of blood pressure medicine. Doing this puts you at risk for problems and can make the medicine less effective. Ask your health care provider about side effects or reactions to medicines that you should watch for. Contact a health care provider if you: Think you are having a reaction to a medicine you are taking. Have headaches that keep coming back (recurring). Feel dizzy. Have swelling in your ankles. Have trouble with your vision. Get help right away if you: Develop a severe headache or confusion. Have unusual weakness or numbness. Feel faint. Have severe pain in your chest or abdomen. Vomit repeatedly. Have trouble breathing. These symptoms may be an emergency. Get help right away. Call 911. Do not wait to see if the symptoms will go away. Do not drive yourself to the hospital. Summary Hypertension is when the force of blood pumping through your arteries is too strong. If this condition is not controlled, it may put you at risk for serious complications. Your personal target blood pressure may vary depending on your medical conditions, your age, and other factors. For most people, a normal blood pressure is less than 120/80. Hypertension is treated with lifestyle changes, medicines, or a combination of both. Lifestyle changes include losing weight, eating a healthy,   low-sodium diet, exercising more, and limiting alcohol. This information is not intended to replace advice given to you by your health care provider. Make sure you discuss any questions you have with your health care provider. Document Revised: 04/17/2021 Document Reviewed: 04/17/2021 Elsevier Patient Education  2023 [employer]  

## 2022-06-04 NOTE — Progress Notes (Signed)
Patient presents today for BPC. She is currently taking Cardvedilol 3.12mg  BID lasix 20mg  PRN hctz 12.5mg  and valsartan 80mg .   BP Readings from Last 3 Encounters:  06/04/22 130/70  05/21/22 128/66  03/21/22 128/70   Patient will continue with currently medication. Valsartan and hctz sent as one tablet. BMP labs done today. Pt will follow up with provider as scheduled.

## 2022-06-05 ENCOUNTER — Ambulatory Visit: Payer: Medicare HMO | Admitting: Internal Medicine

## 2022-06-05 ENCOUNTER — Encounter: Payer: Self-pay | Admitting: Internal Medicine

## 2022-06-05 ENCOUNTER — Ambulatory Visit (INDEPENDENT_AMBULATORY_CARE_PROVIDER_SITE_OTHER): Payer: Medicare HMO

## 2022-06-05 VITALS — BP 114/66 | HR 56 | Temp 98.3°F | Ht 63.0 in | Wt 214.8 lb

## 2022-06-05 DIAGNOSIS — J45991 Cough variant asthma: Secondary | ICD-10-CM | POA: Insufficient documentation

## 2022-06-05 DIAGNOSIS — R059 Cough, unspecified: Secondary | ICD-10-CM | POA: Diagnosis not present

## 2022-06-05 LAB — BMP8+EGFR
BUN/Creatinine Ratio: 14 (ref 12–28)
BUN: 11 mg/dL (ref 8–27)
CO2: 22 mmol/L (ref 20–29)
Calcium: 9.4 mg/dL (ref 8.7–10.3)
Chloride: 104 mmol/L (ref 96–106)
Creatinine, Ser: 0.79 mg/dL (ref 0.57–1.00)
Glucose: 102 mg/dL — ABNORMAL HIGH (ref 70–99)
Potassium: 4.1 mmol/L (ref 3.5–5.2)
Sodium: 141 mmol/L (ref 134–144)
eGFR: 77 mL/min/{1.73_m2} (ref >59)

## 2022-06-05 LAB — NITRIC OXIDE: Nitric Oxide: 11

## 2022-06-05 MED ORDER — PREDNISONE 10 MG PO TABS: ORAL_TABLET | ORAL | 0 refills | Status: DC

## 2022-06-05 NOTE — Patient Instructions (Addendum)
Pantoprazole (protonix) 40 mg   Take  30-60 min before first meal of the day and Pepcid (famotidine)  20 mg after supper until return to office - this is the best way to tell whether stomach acid is contributing to your problem.     GERD (REFLUX)  is an extremely common cause of respiratory symptoms just like yours , many times with no obvious heartburn at all.    It can be treated with medication, but also with lifestyle changes including elevation of the head of your bed (ideally with 6 -8inch blocks under the headboard of your bed),  Smoking cessation, avoidance of late meals, excessive alcohol, and avoid fatty foods, chocolate, peppermint, colas, red wine, and acidic juices such as orange juice.  NO MINT OR MENTHOL PRODUCTS SO NO COUGH DROPS  USE SUGARLESS CANDY INSTEAD (Jolley ranchers or Stover's or Life Savers) or even ice chips will also do - the key is to swallow to prevent all throat clearing. NO OIL BASED VITAMINS - use powdered substitutes.  Avoid fish oil when coughing.   For drainage / drippy nose throat tickle try off Zyrtec  take as needed  CHLORPHENIRAMINE  4 mg  ("Allergy Relief" 4mg   at [facility] should be easiest to find in the blue box usually on bottom shelf)  take one every 4 hours as needed - extremely effective and inexpensive over the counter- may cause drowsiness so start with just a dose or two an hour before bedtime and see how you tolerate it before trying in daytime.   Prednisone 10 mg take  4 each am x 2 days,   2 each am x 2 days,  1 each am x 2 days and stop   If prednisone really helps you, start symbicort 80 Take 2 puffs first thing in am and then another 2 puffs about 12 hours later. (Call for prescription)   For breathing or can't stop coughing  > albuterol 2 puff every 4 hours as needed but work on smooth deep breath like we showed you here.  Please remember to go to the  x-ray department  for your tests - we will call you with the results when they  are available    Please schedule a follow up office visit in 4 weeks, call sooner if needed with all medications /inhalers/ solutions in hand so we can verify exactly what you are taking. This includes all medications from all doctors and over the counters - PLEASE separate them into two bags:  the ones you take automatically, no matter what, vs the ones you take just when you feel you need them "BAG #2 is UP TO YOU"  - this will really help [facility] help you take your medications more effectively.

## 2022-06-05 NOTE — Progress Notes (Unsigned)
[name], female    DOB: [birth year]   MRN: [id number]   Brief patient profile:  57  yobf never smoker no prior resp dz but spring 2020 rhinitis itching sneezing and coughing no better with nyquil  referred to pulmonary clinic 06/05/2022 by [name]  for cough.   Allergy w/u :   strongly Pos IgE 559   68/2023       History of Present Illness  06/05/2022  Pulmonary/ 1st office eval/[name]  Chief Complaint  Patient presents with   Consult    Pt states she a chronic productive cough for the past 8 months.  Dyspnea:  new and doe to street and back x 4 months  Cough: sporadic minimal white / assoc with nasal d/c  Sleep: 10 degrees  better with pearls  SABA use: seems to help though not very poor hfa   Past Medical History:  Diagnosis Date   Arthritis    hands   Bladder infection    Carpal tunnel syndrome    Complication of anesthesia    Difficult to arouse   Diabetes mellitus    GERD (gastroesophageal reflux disease)    Hypertension    Sleep apnea     Outpatient Medications Prior to Visit  Medication Sig Dispense Refill   albuterol (VENTOLIN HFA) 108 (90 Base) MCG/ACT inhaler Inhale 2 puffs into the lungs every 6 (six) hours as needed for wheezing or shortness of breath. 18 g 2   Ascorbic Acid (VITAMIN C) 1000 MG tablet Take 1,000 mg by mouth daily.     aspirin EC 81 MG tablet Take 1 tablet (81 mg total) by mouth 2 (two) times daily. 60 tablet 0   azelastine (ASTELIN) 0.1 % nasal spray 2 sprays each nostril 2 times daily for drainage control. 30 mL 5   Bacillus Coagulans-Inulin (PROBIOTIC FORMULA PO) Take 1 capsule by mouth 2 (two) times a week.     benzonatate (TESSALON PERLES) 100 MG capsule Take 1 capsule (100 mg total) by mouth 3 (three) times daily as needed for cough. 30 capsule 1   carvedilol (COREG) 3.125 MG tablet Take 1 tablet (3.125 mg total) by mouth 2 (two) times daily. 180 tablet 1   cetirizine (ZYRTEC) 10 MG tablet Take 10 mg by mouth daily as  needed for allergies.     Cholecalciferol (VITAMIN D) 50 MCG (2000 UT) tablet Take 2,000 Units by mouth daily.     clotrimazole-betamethasone (LOTRISONE) cream Apply topically 2 (two) times daily as needed. (Patient taking differently: Apply 1 application  topically 2 (two) times daily as needed (eczema).) 30 g 1   Coenzyme Q10 (COQ10) 100 MG CAPS Take 100 mg by mouth daily with lunch.      estradiol (ESTRACE) 0.1 MG/GM vaginal cream Place 1 Applicatorful vaginally daily as needed.     fluticasone (FLONASE) 50 MCG/ACT nasal spray Place 1 spray into both nostrils daily as needed for allergies or rhinitis.     furosemide (LASIX) 20 MG tablet Take 1 tablet (20 mg total) by mouth daily as needed (fluid). 90 tablet 1   HYDROcodone bit-homatropine (HYDROMET) 5-1.5 MG/5ML syrup Take 5 mLs by mouth every 6 (six) hours as needed. 120 mL 0   ipratropium (ATROVENT) 0.06 % nasal spray Place 2 sprays into both nostrils 3 (three) times daily as needed for rhinitis. 15 mL 3   loratadine (CLARITIN) 10 MG tablet Take 10 mg by mouth daily as needed for allergies.  meloxicam (MOBIC) 15 MG tablet Take 15 mg by mouth daily.     metFORMIN (GLUCOPHAGE) 500 MG tablet Take 1 tablet (500 mg total) by mouth 2 (two) times daily with a meal. 180 tablet 2   OneTouch Delica Lancets 33G MISC USE   TO CHECK GLUCOSE BEFORE BREAKFAST AND  DINNER 100 each 0   ONETOUCH VERIO test strip USE 1 STRIP TO CHECK GLUCOSE TWICE DAILY 100 each 0   OXYGEN Inhale 1 L into the lungs at bedtime as needed (oxygen).     pantoprazole (PROTONIX) 40 MG tablet Take 1 tablet (40 mg total) by mouth daily. 30 tablet 1   potassium chloride SA (KLOR-CON M) 20 MEQ tablet TAKE 1 TABLET BY MOUTH ONCE DAILY AS NEEDED WITH EACH DOSE OF HYDROCHLOROTHIAZIDE 90 tablet 0   rosuvastatin (CRESTOR) 20 MG tablet Take 20 mg by mouth daily.     rosuvastatin (CRESTOR) 20 MG tablet Take 1 tablet by mouth daily.     valsartan-hydrochlorothiazide (DIOVAN HCT) 80-12.5 MG  tablet Take 1 tablet by mouth daily. 90 tablet 1   famotidine (PEPCID) 20 MG tablet Take 1 tablet (20 mg total) by mouth 2 (two) times daily. 180 tablet 1   No facility-administered medications prior to visit.     Objective:     BP 114/66 (BP Location: Left Arm, Patient Position: Sitting, Cuff Size: Large)   Pulse (!) 56   Temp 98.3 F (36.8 C) (Oral)   Ht 5\' 3"  (1.6 m)   Wt 214 lb 12.8 oz (97.4 kg)   SpO2 100% Comment: on RA  BMI 38.05 kg/m   SpO2: 100 % (on RA)  Amb somber bf with harsh upper airway cough worse with fvc at end exp       Assessment   No problem-specific Assessment & Plan notes found for this encounter.     , MD 06/05/2022

## 2022-06-06 ENCOUNTER — Encounter: Payer: Self-pay | Admitting: Internal Medicine

## 2022-06-06 ENCOUNTER — Other Ambulatory Visit (HOSPITAL_COMMUNITY): Payer: Self-pay | Admitting: Orthopedic Surgery

## 2022-06-06 DIAGNOSIS — M25562 Pain in left knee: Secondary | ICD-10-CM

## 2022-06-06 NOTE — Assessment & Plan Note (Addendum)
Onset rhinitis spring 2020 follow by onset of chronic cough /doe x 200 ft (not reproduced at ov) - Allergy w/u :   strongly Pos IgE 559   11/29/2021    - 06/05/2022   Walked on RA  x  3  lap(s) =  approx 750  ft  @ fast pace, stopped due to end of study  with lowest 02 sats 98% s sob /cough /cp  - FENO 06/05/2022  = 11 - 06/05/2022  After extensive coaching inhaler device,  effectiveness =   50% from baseline near 0  - rx max gerd rx plus 1st gen H1 blockers per guidelines  06/05/2022 >>>  I suspect she has elements of cough variant asthma and Upper airway cough syndrome (previously labeled PNDS),  is so named because it's frequently impossible to sort out how much is  CR/sinusitis with freq throat clearing (which can be related to primary GERD)   vs  causing  secondary (" extra esophageal")  GERD from wide swings in gastric pressure that occur with throat clearing, often  promoting self use of mint and menthol lozenges that reduce the lower esophageal sphincter tone and exacerbate the problem further in a cyclical fashion.   These are the same pts (now being labeled as having "irritable larynx syndrome" by some cough centers) who not infrequently have a history of having failed to tolerate ace inhibitors,  dry powder inhalers or biphosphonates or report having atypical/extraesophageal reflux symptoms that don't respond to standard doses of PPI  and are easily confused as having aecopd or asthma flares by even experienced allergists/ pulmonologists (myself included).   Rec Max rx for gerd Max rx for rhinitis pnds with 1st gen H1 blockers per guidelines  if tolerated  >>> also so added 6 day taper off  Prednisone starting at 40 mg per day in case of component of Th-2 driven upper or lower airways inflammation (if cough responds short term only to relapse before return while will on full rx for uacs (as above), then  that would point to allergic rhinitis/ asthma or eos bronchitis as alternative dx) and if  so add symb 80 2bid trial   Reviewed: The standardized cough guidelines published in Chest by [place] in 2006 are still the best available and consist of a multiple step process (up to 12!) , not a single office visit,  and are intended  to address this problem logically,  with an alogrithm dependent on response to empiric treatment at  each progressive step  to determine a specific diagnosis with  minimal addtional testing needed. Therefore if adherence is an issue or can't be accurately verified,  it's very unlikely the standard evaluation and treatment will be successful here.    Furthermore, response to therapy (other than acute cough suppression, which should only be used short term with avoidance of narcotic containing cough syrups if possible), can be a gradual process for which the patient is not likely to  perceive immediate benefit.  Unlike going to an eye doctor where the best perscription is almost always the first one and is immediately effective, this is almost never the case in the management of chronic cough syndromes. Therefore the patient needs to commit up front to consistently adhere to recommendations  for up to 6 weeks of therapy directed at the likely underlying problem(s) before the response can be reasonably evaluated.   F/u in 4 weeks with all meds in hand using a trust but verify approach to  confirm accurate Medication  Reconciliation The principal here is that until we are certain that the  patients are doing what we've asked, it makes no sense to ask them to do more.   Each maintenance medication was reviewed in detail including emphasizing most importantly the difference between maintenance and prns and under what circumstances the prns are to be triggered using an action plan format where appropriate.  Total time for H and P, chart review, counseling, reviewing hfa device(s) , directly observing portions of ambulatory 02 saturation study/ and generating customized  AVS unique to this office visit / same day charting > for new pt with  refractory respiratory  symptoms of uncertain etiology

## 2022-06-19 ENCOUNTER — Ambulatory Visit (HOSPITAL_COMMUNITY)
Admission: RE | Admit: 2022-06-19 | Discharge: 2022-06-19 | Disposition: A | Discharge status: Home / Self Care | Payer: Medicare HMO | Source: Ambulatory Visit | Attending: Orthopedic Surgery | Admitting: Orthopedic Surgery

## 2022-06-19 ENCOUNTER — Encounter (HOSPITAL_COMMUNITY)
Admission: RE | Admit: 2022-06-19 | Discharge: 2022-06-19 | Disposition: A | Discharge status: Home / Self Care | Payer: Medicare HMO | Source: Ambulatory Visit | Attending: Orthopedic Surgery | Admitting: Orthopedic Surgery

## 2022-06-19 DIAGNOSIS — M7989 Other specified soft tissue disorders: Secondary | ICD-10-CM | POA: Diagnosis not present

## 2022-06-19 DIAGNOSIS — M25562 Pain in left knee: Secondary | ICD-10-CM | POA: Diagnosis not present

## 2022-06-19 MED ORDER — TECHNETIUM TC 99M MEDRONATE IV KIT
20.0000 | PACK | Freq: Once | INTRAVENOUS | Status: AC | PRN
  Administered 2022-06-19: 21.7 via INTRAVENOUS

## 2022-06-25 ENCOUNTER — Telehealth: Payer: Self-pay | Admitting: Internal Medicine

## 2022-06-25 NOTE — Telephone Encounter (Signed)
Called and spoke with pt letting her know info per Dr. Melvyn Novas and she verbalized understanding. Appt scheduled for pt with Beth. Nothing further needed.

## 2022-06-25 NOTE — Telephone Encounter (Signed)
Sorry to hear that - needs ov asap with all active meds in hand to regroup   Only other option is allergy eval > [facility]

## 2022-06-25 NOTE — Telephone Encounter (Signed)
Spoke with pt who states that she stopped taking prednisone 1 week ago r/t chest pain. Pt stated she could not stand the pain and it was so painful she could not wear bra. Pt states cough is unchanged since stopping prednisone. Pt would like to know if there are any other medications that may relieve cough. Dr. Melvyn Novas please advise.

## 2022-06-25 NOTE — Telephone Encounter (Signed)
PT states the Pred we called in for her is making her chest hurt. She needs to know if she should come off of it. Please call to adv @ [phone]

## 2022-06-25 NOTE — Telephone Encounter (Signed)
You can try her at [phone] as well.

## 2022-06-27 DIAGNOSIS — M25562 Pain in left knee: Secondary | ICD-10-CM | POA: Diagnosis not present

## 2022-07-04 ENCOUNTER — Ambulatory Visit: Payer: Medicare HMO

## 2022-07-04 ENCOUNTER — Ambulatory Visit: Payer: Medicare HMO | Admitting: Internal Medicine

## 2022-07-04 DIAGNOSIS — R0600 Dyspnea, unspecified: Secondary | ICD-10-CM | POA: Diagnosis not present

## 2022-07-04 DIAGNOSIS — I10 Essential (primary) hypertension: Secondary | ICD-10-CM | POA: Diagnosis not present

## 2022-07-04 DIAGNOSIS — R0602 Shortness of breath: Secondary | ICD-10-CM | POA: Diagnosis not present

## 2022-07-04 DIAGNOSIS — G4733 Obstructive sleep apnea (adult) (pediatric): Secondary | ICD-10-CM | POA: Diagnosis not present

## 2022-07-10 ENCOUNTER — Ambulatory Visit (INDEPENDENT_AMBULATORY_CARE_PROVIDER_SITE_OTHER): Payer: Medicare HMO

## 2022-07-10 ENCOUNTER — Ambulatory Visit: Payer: Medicare HMO | Admitting: Primary Care

## 2022-07-10 VITALS — BP 128/68 | HR 55 | Temp 98.0°F | Ht 63.0 in | Wt 214.0 lb

## 2022-07-10 DIAGNOSIS — Z0000 Encounter for general adult medical examination without abnormal findings: Secondary | ICD-10-CM | POA: Diagnosis not present

## 2022-07-10 NOTE — Patient Instructions (Signed)
[name] , Thank you for taking time to come for your Medicare Wellness Visit. I appreciate your ongoing commitment to your health goals. Please review the following plan we discussed and let me know if I can assist you in the future.   These are the goals we discussed:  Goals       DIET - INCREASE WATER INTAKE (pt-stated)      Exercise 150 min/wk Moderate Activity (pt-stated)      Manage My Medicine      Timeframe:  Long-Range Goal Priority:  High Start Date:    08/08/2020                         Expected En/d Date:                       Follow Up Date : 12/06/2020   - call for medicine refill 2 or 3 days before it runs out - keep a list of all the medicines I take; vitamins and herbals too - use a pillbox to sort medicine - use an alarm clock or phone to remind me to take my medicine    Why is this important?   These steps will help you keep on track with your medicines.         Patient Stated      06/09/2019, wants to get knee replacement done      Patient Stated      06/08/2020, continue losing weight and get over allergies      Patient Stated      06/14/2021, wants to get knee healed      [place] (see longitudinal plan of care for additional care plan information)  Current Barriers:  Chronic Disease Management support, education, and care coordination needs related to Hypertension, Hyperlipidemia, and Diabetes   Hypertension BP Readings from Last 3 Encounters:  12/09/19 110/76  10/07/19 140/80  08/10/19 126/80  Pharmacist Clinical Goal(s): Over the next 180 days, patient will work with PharmD and providers to maintain BP goal <130/80 Current regimen:  Furosemide 20mg  daily as needed Hydrochlorothiazide 12.5mg  daily as needed Losartan 25mg  twice weekly Potassium 23mEq daily as needed with HCTZ Interventions: Provided dietary and exercise recommendations Patient self care activities - Over the next 180 days, patient  will: Check BP weekly, document, and provide at future appointments Obtain a blood pressure monitor from insurance Ensure daily salt intake < 2300 mg/day  Hyperlipidemia Lab Results  Component Value Date/Time   LDLCALC 117 (H) 08/10/2019 10:50 AM  Pharmacist Clinical Goal(s): Over the next 90 days, patient will work with PharmD and providers to achieve LDL goal < 70 Current regimen:  Pravastatin 80mg  twice weekly CoQ10 100mg  daily Interventions: Provided dietary and exercise recommendations Discussed prior statin therapy (patient does not recall any) Monitor next lipid panel. Consider increasing statin if LDL still elevated Patient self care activities - Over the next 90 days, patient will: Take cholesterol medication daily as directed Try water aerobics  Diabetes Lab Results  Component Value Date/Time   HGBA1C 5.9 (H) 12/09/2019 11:19 AM   HGBA1C 6.1 (H) 08/10/2019 10:50 AM  Pharmacist Clinical Goal(s): Over the next 180 days, patient will work with PharmD and providers to maintain A1c goal <7% Current regimen:  Metformin 500mg  twice daily with a meal Interventions: Provided dietary and exercise recommendations Patient self care activities - Over the next  180 days, patient will: Check blood sugar every 2-3 days, document, and provide at future appointments Contact provider with any episodes of hypoglycemia Try water aerobics  Medication management Pharmacist Clinical Goal(s): Over the next 90 days, patient will work with PharmD and providers to maintain optimal medication adherence Current pharmacy: [facility] Interventions Comprehensive medication review performed. Continue current medication management strategy Patient self care activities - Over the next 90 days, patient will: Focus on medication adherence by continued use of pill box Take medications as prescribed Report any questions or concerns to PharmD and/or provider(s)  Initial goal documentation          This is a list of the screening recommended for you and due dates:  Health Maintenance  Topic Date Due   DTaP/Tdap/Td vaccine (1 - Tdap) Never done   Pneumonia Vaccine (1 - PCV) Never done   COVID-19 Vaccine (4 - 2023-24 season) 02/22/2022   Medicare Annual Wellness Visit  06/14/2022   Zoster (Shingles) Vaccine (1 of 2) 08/21/2022*   Eye exam for diabetics  07/30/2022   Yearly kidney health urinalysis for diabetes  09/12/2022   Complete foot exam   09/12/2022   Hemoglobin A1C  11/19/2022   Mammogram  12/28/2022   Yearly kidney function blood test for diabetes  06/05/2023   DEXA scan (bone density measurement)  Completed   Hepatitis C Screening: USPSTF Recommendation to screen - Ages 26-79 yo.  Completed   HPV Vaccine  Aged Out   Flu Shot  Discontinued   Colon Cancer Screening  Discontinued  *Topic was postponed. The date shown is not the original due date.    Advanced directives: Advance directive discussed with you today. Even though you declined this today please call our office should you change your mind and we can give you the proper paperwork for you to fill out.  Conditions/risks identified: none  Next appointment: Follow up in one year for your annual wellness visit    Preventive Care 65 Years and Older, Female Preventive care refers to lifestyle choices and visits with your health care provider that can promote health and wellness. What does preventive care include? A yearly physical exam. This is also called an annual well check. Dental exams once or twice a year. Routine eye exams. Ask your health care provider how often you should have your eyes checked. Personal lifestyle choices, including: Daily care of your teeth and gums. Regular physical activity. Eating a healthy diet. Avoiding tobacco and drug use. Limiting alcohol use. Practicing safe sex. Taking low-dose aspirin every day. Taking vitamin and mineral supplements as recommended by your health care  provider. What happens during an annual well check? The services and screenings done by your health care provider during your annual well check will depend on your age, overall health, lifestyle risk factors, and family history of disease. Counseling  Your health care provider may ask you questions about your: Alcohol use. Tobacco use. Drug use. Emotional well-being. Home and relationship well-being. Sexual activity. Eating habits. History of falls. Memory and ability to understand (cognition). Work and work Astronomer. Reproductive health. Screening  You may have the following tests or measurements: Height, weight, and BMI. Blood pressure. Lipid and cholesterol levels. These may be checked every 5 years, or more frequently if you are over 30 years old. Skin check. Lung cancer screening. You may have this screening every year starting at age 30 if you have a 30-pack-year history of smoking and currently smoke or have quit within the  past 15 years. Fecal occult blood test (FOBT) of the stool. You may have this test every year starting at age 2. Flexible sigmoidoscopy or colonoscopy. You may have a sigmoidoscopy every 5 years or a colonoscopy every 10 years starting at age 29. Hepatitis C blood test. Hepatitis B blood test. Sexually transmitted disease (STD) testing. Diabetes screening. This is done by checking your blood sugar (glucose) after you have not eaten for a while (fasting). You may have this done every 1-3 years. Bone density scan. This is done to screen for osteoporosis. You may have this done starting at age 22. Mammogram. This may be done every 1-2 years. Talk to your health care provider about how often you should have regular mammograms. Talk with your health care provider about your test results, treatment options, and if necessary, the need for more tests. Vaccines  Your health care provider may recommend certain vaccines, such as: Influenza vaccine. This is  recommended every year. Tetanus, diphtheria, and acellular pertussis (Tdap, Td) vaccine. You may need a Td booster every 10 years. Zoster vaccine. You may need this after age 39. Pneumococcal 13-valent conjugate (PCV13) vaccine. One dose is recommended after age 47. Pneumococcal polysaccharide (PPSV23) vaccine. One dose is recommended after age 67. Talk to your health care provider about which screenings and vaccines you need and how often you need them. This information is not intended to replace advice given to you by your health care provider. Make sure you discuss any questions you have with your health care provider. Document Released: 07/07/2015 Document Revised: 02/28/2016 Document Reviewed: 04/11/2015 Elsevier Interactive Patient Education  2017 [employer] Prevention in the Home Falls can cause injuries. They can happen to people of all ages. There are many things you can do to make your home safe and to help prevent falls. What can I do on the outside of my home? Regularly fix the edges of walkways and driveways and fix any cracks. Remove anything that might make you trip as you walk through a door, such as a raised step or threshold. Trim any bushes or trees on the path to your home. Use bright outdoor lighting. Clear any walking paths of anything that might make someone trip, such as rocks or tools. Regularly check to see if handrails are loose or broken. Make sure that both sides of any steps have handrails. Any raised decks and porches should have guardrails on the edges. Have any leaves, snow, or ice cleared regularly. Use sand or salt on walking paths during winter. Clean up any spills in your garage right away. This includes oil or grease spills. What can I do in the bathroom? Use night lights. Install grab bars by the toilet and in the tub and shower. Do not use towel bars as grab bars. Use non-skid mats or decals in the tub or shower. If you need to sit down in  the shower, use a plastic, non-slip stool. Keep the floor dry. Clean up any water that spills on the floor as soon as it happens. Remove soap buildup in the tub or shower regularly. Attach bath mats securely with double-sided non-slip rug tape. Do not have throw rugs and other things on the floor that can make you trip. What can I do in the bedroom? Use night lights. Make sure that you have a light by your bed that is easy to reach. Do not use any sheets or blankets that are too big for your bed. They should not  hang down onto the floor. Have a firm chair that has side arms. You can use this for support while you get dressed. Do not have throw rugs and other things on the floor that can make you trip. What can I do in the kitchen? Clean up any spills right away. Avoid walking on wet floors. Keep items that you use a lot in easy-to-reach places. If you need to reach something above you, use a strong step stool that has a grab bar. Keep electrical cords out of the way. Do not use floor polish or wax that makes floors slippery. If you must use wax, use non-skid floor wax. Do not have throw rugs and other things on the floor that can make you trip. What can I do with my stairs? Do not leave any items on the stairs. Make sure that there are handrails on both sides of the stairs and use them. Fix handrails that are broken or loose. Make sure that handrails are as long as the stairways. Check any carpeting to make sure that it is firmly attached to the stairs. Fix any carpet that is loose or worn. Avoid having throw rugs at the top or bottom of the stairs. If you do have throw rugs, attach them to the floor with carpet tape. Make sure that you have a light switch at the top of the stairs and the bottom of the stairs. If you do not have them, ask someone to add them for you. What else can I do to help prevent falls? Wear shoes that: Do not have high heels. Have rubber bottoms. Are comfortable  and fit you well. Are closed at the toe. Do not wear sandals. If you use a stepladder: Make sure that it is fully opened. Do not climb a closed stepladder. Make sure that both sides of the stepladder are locked into place. Ask someone to hold it for you, if possible. Clearly mark and make sure that you can see: Any grab bars or handrails. First and last steps. Where the edge of each step is. Use tools that help you move around (mobility aids) if they are needed. These include: Canes. Walkers. Scooters. Crutches. Turn on the lights when you go into a dark area. Replace any light bulbs as soon as they burn out. Set up your furniture so you have a clear path. Avoid moving your furniture around. If any of your floors are uneven, fix them. If there are any pets around you, be aware of where they are. Review your medicines with your doctor. Some medicines can make you feel dizzy. This can increase your chance of falling. Ask your doctor what other things that you can do to help prevent falls. This information is not intended to replace advice given to you by your health care provider. Make sure you discuss any questions you have with your health care provider. Document Released: 04/06/2009 Document Revised: 11/16/2015 Document Reviewed: 07/15/2014 Elsevier Interactive Patient Education  2017 [employer].

## 2022-07-10 NOTE — Progress Notes (Signed)
Subjective:   [name] is a 77 y.o. female who presents for Medicare Annual (Subsequent) preventive examination.  Review of Systems     Cardiac Risk Factors include: advanced age (>2655men, 49>65 women);diabetes mellitus;dyslipidemia;hypertension;obesity (BMI >30kg/m2)     Objective:    Today's Vitals   07/10/22 1220 07/10/22 1228  BP: 128/68   Pulse: (!) 55   Temp: 98 F (36.7 C)   TempSrc: Oral   SpO2: 97%   Weight: 214 lb (97.1 kg)   Height: 5\' 3"  (1.6 m)   PainSc:  6    Body mass index is 37.91 kg/m.     07/10/2022   12:34 PM 06/14/2021    2:58 PM 05/21/2021   11:11 AM 05/09/2021   11:30 AM 06/08/2020   12:18 PM 06/09/2019   11:49 AM 04/20/2019    4:14 PM  Advanced Directives  Does Patient Have a Medical Advance Directive? Yes Yes Yes Yes Yes Yes No  Type of Estate agentAdvance Directive Healthcare Power of [place];Living [place] Healthcare Power of [place];Living will Healthcare Power of [place];Living will Healthcare Power of [place];Living will Healthcare Power of [place];Living will Healthcare Power of [place] LifeAttorney;Living will   Does patient want to make changes to medical advance directive?   No - Patient declined      Copy of Healthcare Power of Attorney in Chart? No - copy requested No - copy requested No - copy requested  No - copy requested No - copy requested   Would patient like information on creating a medical advance directive?       No - Patient declined    Current Medications (verified) Outpatient Encounter Medications as of 07/10/2022  Medication Sig   albuterol (VENTOLIN HFA) 108 (90 Base) MCG/ACT inhaler Inhale 2 puffs into the lungs every 6 (six) hours as needed for wheezing or shortness of breath.   Ascorbic Acid (VITAMIN C) 1000 MG tablet Take 1,000 mg by mouth daily.   aspirin EC 81 MG tablet Take 1 tablet (81 mg total) by mouth 2 (two) times daily.   azelastine (ASTELIN) 0.1 % nasal spray 2 sprays each nostril 2 times daily for drainage control.    Bacillus Coagulans-Inulin (PROBIOTIC FORMULA PO) Take 1 capsule by mouth 2 (two) times a week.   benzonatate (TESSALON PERLES) 100 MG capsule Take 1 capsule (100 mg total) by mouth 3 (three) times daily as needed for cough.   carvedilol (COREG) 3.125 MG tablet Take 1 tablet (3.125 mg total) by mouth 2 (two) times daily.   cetirizine (ZYRTEC) 10 MG tablet Take 10 mg by mouth daily as needed for allergies.   Cholecalciferol (VITAMIN D) 50 MCG (2000 UT) tablet Take 2,000 Units by mouth daily.   clotrimazole-betamethasone (LOTRISONE) cream Apply topically 2 (two) times daily as needed. (Patient taking differently: Apply 1 application  topically 2 (two) times daily as needed (eczema).)   Coenzyme Q10 (COQ10) 100 MG CAPS Take 100 mg by mouth daily with lunch.    estradiol (ESTRACE) 0.1 MG/GM vaginal cream Place 1 Applicatorful vaginally daily as needed.   fluticasone (FLONASE) 50 MCG/ACT nasal spray Place 1 spray into both nostrils daily as needed for allergies or rhinitis.   furosemide (LASIX) 20 MG tablet Take 1 tablet (20 mg total) by mouth daily as needed (fluid).   HYDROcodone bit-homatropine (HYDROMET) 5-1.5 MG/5ML syrup Take 5 mLs by mouth every 6 (six) hours as needed.   ipratropium (ATROVENT) 0.06 % nasal spray Place 2 sprays into both nostrils 3 (three) times  daily as needed for rhinitis.   loratadine (CLARITIN) 10 MG tablet Take 10 mg by mouth daily as needed for allergies.   meloxicam (MOBIC) 15 MG tablet Take 15 mg by mouth daily.   metFORMIN (GLUCOPHAGE) 500 MG tablet Take 1 tablet (500 mg total) by mouth 2 (two) times daily with a meal.   OneTouch Delica Lancets 33G MISC USE   TO CHECK GLUCOSE BEFORE BREAKFAST AND  DINNER   ONETOUCH VERIO test strip USE 1 STRIP TO CHECK GLUCOSE TWICE DAILY   OXYGEN Inhale 1 L into the lungs at bedtime as needed (oxygen).   pantoprazole (PROTONIX) 40 MG tablet Take 1 tablet (40 mg total) by mouth daily.   potassium chloride SA (KLOR-CON M) 20 MEQ tablet  TAKE 1 TABLET BY MOUTH ONCE DAILY AS NEEDED WITH EACH DOSE OF HYDROCHLOROTHIAZIDE   predniSONE (DELTASONE) 10 MG tablet Take  4 each am x 2 days,   2 each am x 2 days,  1 each am x 2 days and stop   rosuvastatin (CRESTOR) 20 MG tablet Take 20 mg by mouth daily.   rosuvastatin (CRESTOR) 20 MG tablet Take 1 tablet by mouth daily.   valsartan-hydrochlorothiazide (DIOVAN HCT) 80-12.5 MG tablet Take 1 tablet by mouth daily.   famotidine (PEPCID) 20 MG tablet Take 1 tablet (20 mg total) by mouth 2 (two) times daily.   No facility-administered encounter medications on file as of 07/10/2022.    Allergies (verified) Pravastatin; Haemophilus influenzae vaccines; Sulfa antibiotics; Sulfonamide derivatives; Tetanus toxoid, adsorbed; and Tetanus toxoids   History: Past Medical History:  Diagnosis Date   Arthritis    hands   Bladder infection    Carpal tunnel syndrome    Complication of anesthesia    Difficult to arouse   Diabetes mellitus    GERD (gastroesophageal reflux disease)    Hypertension    Sleep apnea    Past Surgical History:  Procedure Laterality Date   ABDOMINAL HYSTERECTOMY  1985   partial   APPENDECTOMY     age 50   CARPAL TUNNEL RELEASE Bilateral 1980   cataract surgery Right 07/07/2020   Dr. Dione Booze   cataract surgery Left 12/2020   HERNIA REPAIR  1990   TONSILLECTOMY     as a child   TOTAL KNEE ARTHROPLASTY Right 04/19/2019   Procedure: RIGHT TOTAL KNEE ARTHROPLASTY;  Surgeon: Gean Birchwood, MD;  Location: [facility];  Service: Orthopedics;  Laterality: Right;   TOTAL KNEE ARTHROPLASTY Left 05/21/2021   Procedure: LEFT TOTAL KNEE ARTHROPLASTY;  Surgeon: Gean Birchwood, MD;  Location: [facility];  Service: Orthopedics;  Laterality: Left;   Family History  Problem Relation Age of Onset   Diabetes Mother    Hypertension Mother    Asthma Father    Heart attack Father 62   Stroke Father 4   Diabetes Father    Breast cancer Sister 44   Allergic rhinitis Brother    Diabetes  Brother    Diabetes Brother    Atopy Paternal Grandmother    Social History   Socioeconomic History   Marital status: Widowed    Spouse name: Not on file   Number of children: 2   Years of education: Not on file   Highest education level: Not on file  Occupational History   Occupation: retired  Tobacco Use   Smoking status: Never    Passive exposure: Never   Smokeless tobacco: Never  Vaping Use   Vaping Use: Never used  Substance and Sexual  Activity   Alcohol use: No   Drug use: No   Sexual activity: Not Currently    Birth control/protection: None  Other Topics Concern   Not on file  Social History Narrative   Not on file   Social Determinants of Health   Financial Resource Strain: Low Risk  (07/10/2022)   Overall Financial Resource Strain (CARDIA)    Difficulty of Paying Living Expenses: Not hard at all  Food Insecurity: No Food Insecurity (07/10/2022)   Hunger Vital Sign    Worried About Running Out of Food in the Last Year: Never true    Ran Out of Food in the Last Year: Never true  Transportation Needs: No Transportation Needs (07/10/2022)   PRAPARE - Administrator, Civil Service (Medical): No    Lack of Transportation (Non-Medical): No  Physical Activity: Inactive (07/10/2022)   Exercise Vital Sign    Days of Exercise per Week: 0 days    Minutes of Exercise per Session: 0 min  Stress: No Stress Concern Present (07/10/2022)   [employer] of Occupational Health - Occupational Stress Questionnaire    Feeling of Stress : Not at all  Social Connections: Moderately Integrated (09/11/2021)   Social Connection and Isolation Panel [NHANES]    Frequency of Communication with Friends and Family: More than three times a week    Frequency of Social Gatherings with Friends and Family: Three times a week    Attends Religious Services: More than 4 times per year    Active Member of Clubs or Organizations: Yes    Attends Banker Meetings: More than  4 times per year    Marital Status: Widowed    Tobacco Counseling Counseling given: Not Answered   Clinical Intake:  Pre-visit preparation completed: Yes  Pain : 0-10 Pain Score: 6  Pain Type: Chronic pain Pain Location: Knee Pain Orientation: Left Pain Descriptors / Indicators: Aching Pain Onset: More than a month ago Pain Frequency: Constant     Nutritional Status: BMI > 30  Obese Nutritional Risks: None Diabetes: Yes  How often do you need to have someone help you when you read instructions, pamphlets, or other written materials from your doctor or pharmacy?: 1 - Never  Diabetic? Yes Nutrition Risk Assessment:  Has the patient had any N/V/D within the last 2 months?  No  Does the patient have any non-healing wounds?  No  Has the patient had any unintentional weight loss or weight gain?  No   Diabetes:  Is the patient diabetic?  Yes  If diabetic, was a CBG obtained today?  No  Did the patient bring in their glucometer from home?  No  How often do you monitor your CBG's? daily.   Financial Strains and Diabetes Management:  Are you having any financial strains with the device, your supplies or your medication? No .  Does the patient want to be seen by Chronic Care Management for management of their diabetes?  No  Would the patient like to be referred to a Nutritionist or for Diabetic Management?  No   Diabetic Exams:  Diabetic Eye Exam: Completed 07/30/2021 Diabetic Foot Exam: Completed 09/11/2021   Interpreter Needed?: No  Information entered by :: NAllen LPN   Activities of Daily Living    07/10/2022   12:34 PM  In your present state of health, do you have any difficulty performing the following activities:  Hearing? 0  Vision? 0  Difficulty concentrating or making decisions? 0  Walking or climbing stairs? 1  Dressing or bathing? 0  Doing errands, shopping? 0  Preparing Food and eating ? N  Using the Toilet? N  In the past six months, have you  accidently leaked urine? Y  Do you have problems with loss of bowel control? N  Managing your Medications? N  Managing your Finances? N  Housekeeping or managing your Housekeeping? N    Patient Care Team: Dorothyann Peng, MD as PCP - General (Internal Medicine) Caud[place], Maryjane Hurter, [facility] (Inactive) (Pharmacist)  Indicate any recent Medical Services you may have received from other than [facility] providers in the past year (date may be approximate).     Assessment:   This is a routine wellness examination for Simms.  Hearing/Vision screen Vision Screening - Comments:: Regular eye exams, [facility]  Dietary issues and exercise activities discussed: Current Exercise Habits: The patient does not participate in regular exercise at present   Goals Addressed             This Visit's Progress    Patient Stated       07/10/2022, get knee straightened out and lose weight       Depression Screen    07/10/2022   12:34 PM 05/21/2022   11:55 AM 02/18/2022   12:25 PM 06/14/2021    2:58 PM 06/08/2020   12:19 PM 10/07/2019   11:44 AM 06/09/2019   11:50 AM  PHQ 2/9 Scores  PHQ - 2 Score 0 0 0 0 0 0 0  PHQ- 9 Score       3    Fall Risk    07/10/2022   12:34 PM 05/21/2022   11:55 AM 02/18/2022   12:25 PM 01/14/2022   11:57 AM 06/14/2021    2:58 PM  Fall Risk   Falls in the past year? 0 0 0 0 0  Number falls in past yr: 0 0 0 0   Injury with Fall? 0 0 0 0   Risk for fall due to : Medication side effect No Fall Risks No Fall Risks  Impaired balance/gait;Impaired mobility;Medication side effect  Follow up Falls prevention discussed;Education provided;Falls evaluation completed Falls evaluation completed Falls evaluation completed Falls evaluation completed Falls evaluation completed;Education provided;Falls prevention discussed    FALL RISK PREVENTION PERTAINING TO THE HOME:  Any stairs in or around the home? Yes  If so, are there any without handrails? No  Home free of loose  throw rugs in walkways, pet beds, electrical cords, etc? Yes  Adequate lighting in your home to reduce risk of falls? Yes   ASSISTIVE DEVICES UTILIZED TO PREVENT FALLS:  Life alert? No  Use of a cane, walker or w/c? No  Grab bars in the bathroom? Yes  Shower chair or bench in shower? No  Elevated toilet seat or a handicapped toilet? Yes   TIMED UP AND GO:  Was the test performed? Yes .  Length of time to ambulate 10 feet: 5 sec.   Gait slow and steady without use of assistive device  Cognitive Function:        07/10/2022   12:35 PM 06/14/2021    3:01 PM 06/08/2020   12:21 PM 06/09/2019   11:53 AM 06/04/2018   10:23 AM  6CIT Screen  What Year? 0 points 0 points 0 points 0 points 0 points  What month? 0 points 0 points 0 points 0 points 0 points  What time? 0 points 0 points 0 points 0 points 3 points  Count back from 20 0 points 0 points 2 points 0 points 0 points  Months in reverse 0 points 2 points 2 points 2 points 2 points  Repeat phrase 8 points 2 points 0 points 4 points 2 points  Total Score 8 points 4 points 4 points 6 points 7 points    Immunizations Immunization History  Administered Date(s) Administered   PFIZER(Purple Top)SARS-COV-2 Vaccination 09/02/2019, 09/27/2019, 06/13/2020    TDAP status: Due, Education has been provided regarding the importance of this vaccine. Advised may receive this vaccine at local pharmacy or Health Dept. Aware to provide a copy of the vaccination record if obtained from local pharmacy or Health Dept. Verbalized acceptance and understanding.  Flu Vaccine status: Declined, Education has been provided regarding the importance of this vaccine but patient still declined. Advised may receive this vaccine at local pharmacy or Health Dept. Aware to provide a copy of the vaccination record if obtained from local pharmacy or Health Dept. Verbalized acceptance and understanding.  Pneumococcal vaccine status: Declined,  Education has been  provided regarding the importance of this vaccine but patient still declined. Advised may receive this vaccine at local pharmacy or Health Dept. Aware to provide a copy of the vaccination record if obtained from local pharmacy or Health Dept. Verbalized acceptance and understanding.   Covid-19 vaccine status: Completed vaccines  Qualifies for Shingles Vaccine? Yes   Zostavax completed No   Shingrix Completed?: No.    Education has been provided regarding the importance of this vaccine. Patient has been advised to call insurance company to determine out of pocket expense if they have not yet received this vaccine. Advised may also receive vaccine at local pharmacy or Health Dept. Verbalized acceptance and understanding.  Screening Tests Health Maintenance  Topic Date Due   DTaP/Tdap/Td (1 - Tdap) Never done   COVID-19 Vaccine (4 - 2023-24 season) 02/22/2022   Zoster Vaccines- Shingrix (1 of 2) 08/21/2022 (Originally 11/16/1964)   Pneumonia Vaccine 23+ Years old (1 - PCV) 07/11/2023 (Originally 11/15/2009)   OPHTHALMOLOGY EXAM  07/30/2022   Diabetic kidney evaluation - Urine ACR  09/12/2022   FOOT EXAM  09/12/2022   HEMOGLOBIN A1C  11/19/2022   MAMMOGRAM  12/28/2022   Diabetic kidney evaluation - eGFR measurement  06/05/2023   Medicare Annual Wellness (AWV)  07/11/2023   DEXA SCAN  Completed   Hepatitis C Screening  Completed   HPV VACCINES  Aged Out   INFLUENZA VACCINE  Discontinued   COLONOSCOPY (Pts 45-82yrs Insurance coverage will need to be confirmed)  Discontinued    Health Maintenance  Health Maintenance Due  Topic Date Due   DTaP/Tdap/Td (1 - Tdap) Never done   COVID-19 Vaccine (4 - 2023-24 season) 02/22/2022    Colorectal cancer screening: Type of screening: Colonoscopy. Completed 05/15/2015. Repeat every 10 years  Mammogram status: Completed 12/27/2021. Repeat every year  Bone Density status: Completed 09/27/2015.   Lung Cancer Screening: (Low Dose CT Chest recommended if  Age 103-80 years, 30 pack-year currently smoking OR have quit w/in 15years.) does not qualify.   Lung Cancer Screening Referral: no  Additional Screening:  Hepatitis C Screening: does qualify; Completed 09/30/2012  Vision Screening: Recommended annual ophthalmology exams for early detection of glaucoma and other disorders of the eye. Is the patient up to date with their annual eye exam?  Yes  Who is the provider or what is the name of the office in which the patient attends annual eye exams? [facility] If pt  is not established with a provider, would they like to be referred to a provider to establish care? No .   Dental Screening: Recommended annual dental exams for proper oral hygiene  Community Resource Referral / Chronic Care Management: CRR required this visit?  No   CCM required this visit?  No      Plan:     I have personally reviewed and noted the following in the patient's chart:   Medical and social history Use of alcohol, tobacco or illicit drugs  Current medications and supplements including opioid prescriptions. Patient is not currently taking opioid prescriptions. Functional ability and status Nutritional status Physical activity Advanced directives List of other physicians Hospitalizations, surgeries, and ER visits in previous 12 months Vitals Screenings to include cognitive, depression, and falls Referrals and appointments  In addition, I have reviewed and discussed with patient certain preventive protocols, quality metrics, and best practice recommendations. A written personalized care plan for preventive services as well as general preventive health recommendations were provided to patient.     Kellie Simmering, LPN   [id number]   Nurse Notes: none

## 2022-07-17 ENCOUNTER — Ambulatory Visit: Payer: Medicare HMO | Admitting: Internal Medicine

## 2022-07-17 ENCOUNTER — Encounter: Payer: Self-pay | Admitting: Internal Medicine

## 2022-07-17 VITALS — BP 122/68 | HR 67 | Temp 98.5°F | Ht 63.0 in | Wt 215.6 lb

## 2022-07-17 DIAGNOSIS — I119 Hypertensive heart disease without heart failure: Secondary | ICD-10-CM | POA: Diagnosis not present

## 2022-07-17 DIAGNOSIS — J45991 Cough variant asthma: Secondary | ICD-10-CM | POA: Diagnosis not present

## 2022-07-17 MED ORDER — METHYLPREDNISOLONE ACETATE 80 MG/ML IJ SUSP
120.0000 mg | Freq: Once | INTRAMUSCULAR | Status: AC
  Administered 2022-07-17: 120 mg via INTRAMUSCULAR

## 2022-07-17 NOTE — Progress Notes (Signed)
[name], female    DOB: 17-Feb-1946   MRN: [id number]   Brief patient profile:  13  yobf never smoker  with ? H/o allergic rhinitis but  much worse since spring 2020 rhinitis itching sneezing and then coughing no better with nyquil  referred to pulmonary clinic 06/05/2022 by Glendale Chard MD  for cough.   Allergy w/u  11/29/21 :   strongly Pos IgE 559 multiple pos RAST  dust mite, grass pollen, cockroach, molds, tree pollen and weed pollen    but skin testing neg > no better on allegra/ singulair, atrovent nasa spray     History of Present Illness  06/05/2022  Pulmonary/ 1st office eval/Edahi Kroening  Chief Complaint  Patient presents with   Consult    Pt states she a chronic productive cough for the past 8 months.  Dyspnea:  new and doe 100 ft to street and back x 4 months    Cough: sporadic minimal white / assoc with nasal d/c  Sleep: 10 degrees , sleeps  better p rx with tessalon pearls  SABA use: seems to help though not very poor hfa technique Rec Pantoprazole (protonix) 40 mg   Take  30-60 min before first meal of the day and Pepcid (famotidine)  20 mg after supper until return to office   GERD diet reviewed, bed blocks rec  For drainage / drippy nose throat tickle try off Zyrtec  take as needed  CHLORPHENIRAMINE  4 mg   Prednisone 10 mg take  4 each am x 2 days,   2 each am x 2 days,  1 each am x 2 days and stop  If prednisone really helps you, start symbicort 80 Take 2 puffs first thing in am and then another 2 puffs about 12 hours later. (Call for prescription) For breathing or can't stop coughing  > albuterol 2 puff every 4 hours as needed but work on smooth deep breath like we showed you here.  Please schedule a follow up office visit in 4 weeks, call sooner if needed with all medications /inhalers/ solutions in hand    07/17/2022  f/u ov/Stephene Alegria re: cough x years but worse since spring 2020   - brought some  meds, not following above instructions, could not take pred pills, L  knee injection ok tol steroiods/  Chief Complaint  Patient presents with   Follow-up    Cough, PND and white mucus with cough.  Runny nose.  Was not able to take prednisone.  Only took 2 tablets, caused chest and left breast pain.  Dyspnea:  variable  Cough: thick white  Sleeping: elevates most nights /twice daily wakes up and feels needs saba to control cough but not using correctly  SABA use: avg  2-3 x days Still using cough drops "but they are sugarless like you said"     No obvious patterns in day to day or daytime variability or assoc excess/ purulent sputum or mucus plugs or hemoptysis or cp or chest tightness, subjective wheeze or overt sinus or hb symptoms.      Also denies any obvious fluctuation of symptoms with weather or environmental changes or other aggravating or alleviating factors except as outlined above   No unusual exposure hx or h/o childhood pna/ asthma or knowledge of premature birth.  Current Allergies, Complete Past Medical History, Past Surgical History, Family History, and Social History were reviewed in [employer] record.  ROS  The following are not active complaints  unless bolded Hoarseness, sore throat, dysphagia, dental problems, itching, sneezing,  nasal congestion or discharge of excess mucus or purulent secretions, ear ache,   fever, chills, sweats, unintended wt loss or wt gain, classically pleuritic or exertional cp,  orthopnea pnd or arm/hand swelling  or leg swelling, presyncope, palpitations, abdominal pain, anorexia, nausea, vomiting, diarrhea  or change in bowel habits or change in bladder habits, change in stools or change in urine, dysuria, hematuria,  rash, arthralgias, visual complaints, headache, numbness, weakness or ataxia or problems with walking or coordination,  change in mood or  memory.        Current Meds  Medication Sig   albuterol (VENTOLIN HFA) 108 (90 Base) MCG/ACT inhaler Inhale 2 puffs into the lungs every  6 (six) hours as needed for wheezing or shortness of breath.   Ascorbic Acid (VITAMIN C) 1000 MG tablet Take 1,000 mg by mouth daily.   aspirin EC 81 MG tablet Take 1 tablet (81 mg total) by mouth 2 (two) times daily.   azelastine (ASTELIN) 0.1 % nasal spray 2 sprays each nostril 2 times daily for drainage control.   Bacillus Coagulans-Inulin (PROBIOTIC FORMULA PO) Take 1 capsule by mouth 2 (two) times a week.   carvedilol (COREG) 3.125 MG tablet Take 1 tablet (3.125 mg total) by mouth 2 (two) times daily.   Cholecalciferol (VITAMIN D) 50 MCG (2000 UT) tablet Take 2,000 Units by mouth daily.   clotrimazole-betamethasone (LOTRISONE) cream Apply topically 2 (two) times daily as needed.   Coenzyme Q10 (COQ10) 100 MG CAPS Take 100 mg by mouth daily with lunch.    estradiol (ESTRACE) 0.1 MG/GM vaginal cream Place 1 Applicatorful vaginally daily as needed.   fluticasone (FLONASE) 50 MCG/ACT nasal spray Place 1 spray into both nostrils daily as needed for allergies or rhinitis.   furosemide (LASIX) 20 MG tablet Take 1 tablet (20 mg total) by mouth daily as needed (fluid).   loratadine (CLARITIN) 10 MG tablet Take 10 mg by mouth daily as needed for allergies.   meloxicam (MOBIC) 15 MG tablet Take 15 mg by mouth daily.   metFORMIN (GLUCOPHAGE) 500 MG tablet Take 1 tablet (500 mg total) by mouth 2 (two) times daily with a meal.   OneTouch Delica Lancets 33G MISC USE   TO CHECK GLUCOSE BEFORE BREAKFAST AND  DINNER   ONETOUCH VERIO test strip USE 1 STRIP TO CHECK GLUCOSE TWICE DAILY   OXYGEN Inhale 1 L into the lungs at bedtime as needed (oxygen).   pantoprazole (PROTONIX) 40 MG tablet Take 1 tablet (40 mg total) by mouth daily.   potassium chloride SA (KLOR-CON M) 20 MEQ tablet TAKE 1 TABLET BY MOUTH ONCE DAILY AS NEEDED WITH EACH DOSE OF HYDROCHLOROTHIAZIDE   rosuvastatin (CRESTOR) 20 MG tablet Take 20 mg by mouth daily.   valsartan-hydrochlorothiazide (DIOVAN HCT) 80-12.5 MG tablet Take 1 tablet by mouth  daily.                      Past Medical History:  Diagnosis Date   Arthritis    hands   Bladder infection    Carpal tunnel syndrome    Complication of anesthesia    Difficult to arouse   Diabetes mellitus    GERD (gastroesophageal reflux disease)    Hypertension    Sleep apnea         Objective:       Wt Readings from Last 3 Encounters:  07/17/22 215 lb 9.6 oz (97.8  kg)  07/10/22 214 lb (97.1 kg)  06/05/22 214 lb 12.8 oz (97.4 kg)      Vital signs reviewed  07/17/2022  - Note at rest 02 sats  98% on RA   General appearance:    somber amb wf, harsh spont upper  airway cough with throat clearing      HEENT : Oropharynx  clear     Nasal turbinates mild non-specific edema   NECK :  without  apparent JVD/ palpable Nodes/TM    LUNGS: no acc muscle use,  Nl contour chest which is clear to A and P bilaterally without cough on insp or exp maneuvers   CV:  RRR  no s3 or murmur or increase in P2, and no edema   ABD:  soft and nontender with nl inspiratory excursion in the supine position. No bruits or organomegaly appreciated   MS:  Nl gait/ ext warm without deformities Or obvious joint restrictions  calf tenderness, cyanosis or clubbing    SKIN: warm and dry without lesions    NEURO:  alert, approp, nl sensorium with  no motor or cerebellar deficits apparent.          Assessment

## 2022-07-17 NOTE — Assessment & Plan Note (Addendum)
She is on both valsartan and losartan chronicallyi  For reasons that may related to vascular permability and nitric oxide pathways but not elevated  bradykinin levels (as seen with  ACEi use) losartan in the generic form has been reported now from mulitple sources  to cause a similar pattern of non-specific  upper airway symptoms as seen with acei.   This has not been reported with exposure to the other ARB's to date, so it seems reasonable for now to try increasing the valsartan and stopping losartan See:  Lelon Frohlich Allergy Asthma Immunol  2008: 101: p [phone]            Each maintenance medication was reviewed in detail including emphasizing most importantly the difference between maintenance and prns and under what circumstances the prns are to be triggered using an action plan format where appropriate.  Total time for H and P, chart review, counseling, reviewing hfa  device(s) and generating customized AVS unique to this office visit / same day charting =  45  min for multiple  refractory respiratory  symptoms of uncertain etiology

## 2022-07-17 NOTE — Assessment & Plan Note (Addendum)
Onset worsening of chronic rhinitis spring 2020 follow by onset of chronic cough  - [facility] Allergy w/u  11/29/21 :   strongly Pos IgE 559 multiple pos RAST  dust mite, grass pollen, cockroach, molds, tree pollen and weed pollen    but skin testing neg > no better on allegra/ singulair, atrovent nasa spray - 06/05/2022   Walked on RA  x  3  lap(s) =  approx 750  ft  @ fast pace, stopped due to end of study  with lowest 02 sats 98% s sob /cough /cp   - FENO 06/05/2022  = 11 - 06/05/2022  After extensive coaching inhaler device,  effectiveness =   50% from baseline near 0  - rx max gerd rx plus 1st gen H1 blockers per guidelines  06/05/2022 >>> did not follow instructions  - 07/17/2022 depomedrol 120 mg IM and max 1st gen h1/hard rock candy to stifle urge to cough and return in 4 weeks with all meds in hand using a trust but verify approach to confirm accurate Medication  Reconciliation The principal here is that until we are certain that the  patients are doing what we've asked, it makes no sense to ask them to do more.   - The proper method of use, as well as anticipated side effects, of a metered-dose inhaler were discussed and demonstrated to the patient using teach back method. Improved effectiveness after extensive coaching during this visit to a level of approximately 50 % from a baseline of 0 % > continue albuterol hfa prn   Advised pt: The standardized cough guidelines published in Chest by Lissa Morales in 2006 are still the best available and consist of a multiple step process (up to 12!) , not a single office visit,  and are intended  to address this problem logically,  with an alogrithm dependent on response to empiric treatment at  each progressive step  to determine a specific diagnosis with  minimal addtional testing needed. Therefore if adherence is an issue or can't be accurately verified,  it's very unlikely the standard evaluation and treatment will be successful here.    Furthermore,  response to therapy (other than acute cough suppression, which should only be used short term with avoidance of narcotic containing cough syrups if possible), can be a gradual process for which the patient is not likely to  perceive immediate benefit.  Unlike going to an eye doctor where the best perscription is almost always the first one and is immediately effective, this is almost never the case in the management of chronic cough syndromes. Therefore the patient needs to commit up front to consistently adhere to recommendations  for up to 6 weeks of therapy directed at the likely underlying problem(s) before the response can be reasonably evaluated.    F/u as above with maint vs prns in 2 separate  bags before going any further in the cough algorthm

## 2022-07-17 NOTE — Patient Instructions (Addendum)
Pantoprazole (protonix) 40 mg   Take  30-60 min before first meal of the day and Pepcid (famotidine)  20 mg after supper until return to office - this is the best way to tell whether stomach acid is contributing to your problem.      avoid fatty foods, chocolate, peppermint, colas, red wine, and acidic juices such as orange juice.  NO MINT OR MENTHOL PRODUCTS SO NO COUGH DROPS  USE SUGARLESS CANDY INSTEAD (Jolley ranchers or Stover's or Life Savers) or even ice chips will also do - the key is to swallow to prevent all throat clearing. NO OIL BASED VITAMINS - use powdered substitutes.  Avoid fish oil when coughing.   CHLORPHENIRAMINE  4 mg  ("Allergy Relief" 4mg   at [facility] should be easiest to find in the blue box usually on bottom shelf)  take one every 4 hours as needed - extremely effective and inexpensive over the counter- may cause drowsiness so start with just a dose or two an hour before bedtime and see how you tolerate it before trying in daytime.      For breathing or can't stop coughing  > albuterol 2 puff every 4 hours as needed but work on smooth deep breath like we showed you here.  Depomedrol 120 mg IM - if much better for 5 days then worse again, I will call you in new inhaler  Please schedule a follow up office visit in 4 weeks, call sooner if needed with all medications /inhalers/ solutions in hand so we can verify exactly what you are taking. This includes all medications from all doctors and over the [place] separate them into two bags:  the ones you take automatically, no matter what, vs the ones you take just when you feel you need them "BAG #2 is UP TO YOU"  - this will really help [place] help you take your medications more effectively.

## 2022-07-17 NOTE — Addendum Note (Signed)
Addended byOralia Rud M on: 07/17/2022 11:57 AM   Modules accepted: Orders

## 2022-07-18 DIAGNOSIS — M25562 Pain in left knee: Secondary | ICD-10-CM | POA: Diagnosis not present

## 2022-08-01 DIAGNOSIS — E119 Type 2 diabetes mellitus without complications: Secondary | ICD-10-CM | POA: Diagnosis not present

## 2022-08-01 DIAGNOSIS — H353131 Nonexudative age-related macular degeneration, bilateral, early dry stage: Secondary | ICD-10-CM | POA: Diagnosis not present

## 2022-08-01 LAB — HM DIABETES EYE EXAM

## 2022-08-04 DIAGNOSIS — G4733 Obstructive sleep apnea (adult) (pediatric): Secondary | ICD-10-CM | POA: Diagnosis not present

## 2022-08-04 DIAGNOSIS — I10 Essential (primary) hypertension: Secondary | ICD-10-CM | POA: Diagnosis not present

## 2022-08-04 DIAGNOSIS — R0600 Dyspnea, unspecified: Secondary | ICD-10-CM | POA: Diagnosis not present

## 2022-08-04 DIAGNOSIS — R0602 Shortness of breath: Secondary | ICD-10-CM | POA: Diagnosis not present

## 2022-08-13 NOTE — Progress Notes (Unsigned)
[name], female    DOB: 1945-06-28   MRN: [id number]   Brief patient profile:  43 yobf never smoker  with ? H/o allergic rhinitis but  much worse since spring 2020 rhinitis itching sneezing and then coughing no better with nyquil  referred to pulmonary clinic 06/05/2022 by Glendale Chard MD  for cough.   Allergy w/u  11/29/21 :   strongly Pos IgE 559 multiple pos RAST  dust mite, grass pollen, cockroach, molds, tree pollen and weed pollen    but skin testing neg > no better on allegra/ singulair, atrovent nasa spray     History of Present Illness  06/05/2022  Pulmonary/ 1st office eval/Lavene [name]  Chief Complaint  Patient presents with   Consult    Pt states she a chronic productive cough for the past 8 months.  Dyspnea:  new and doe 100 ft to street and back x 4 months    Cough: sporadic minimal white / assoc with nasal d/c  Sleep: 10 degrees , sleeps  better p rx with tessalon pearls  SABA use: seems to help though not very poor hfa technique Rec Pantoprazole (protonix) 40 mg   Take  30-60 min before first meal of the day and Pepcid (famotidine)  20 mg after supper until return to office   GERD diet reviewed, bed blocks rec  For drainage / drippy nose throat tickle try off Zyrtec  take as needed  CHLORPHENIRAMINE  4 mg   Prednisone 10 mg take  4 each am x 2 days,   2 each am x 2 days,  1 each am x 2 days and stop  If prednisone really helps you, start symbicort 80 Take 2 puffs first thing in am and then another 2 puffs about 12 hours later. (Call for prescription) For breathing or can't stop coughing  > albuterol 2 puff every 4 hours as needed but work on smooth deep breath like we showed you here.  Please schedule a follow up office visit in 4 weeks, call sooner if needed with all medications /inhalers/ solutions in hand    07/17/2022  f/u ov/[name] re: cough x years but worse since spring 2020   - brought some  meds, not following above instructions, could not take pred pills, Angelica  knee injection ok tol steroiods/  Chief Complaint  Patient presents with   Follow-up    Cough, PND and white mucus with cough.  Runny nose.  Was not able to take prednisone.  Only took 2 tablets, caused chest and left breast pain.  Dyspnea:  variable  Cough: thick white  Sleeping: elevates most nights /twice daily wakes up and feels needs saba to control cough but not using correctly  SABA use: avg  2-3 x days Still using cough drops "but they are sugarless like you said"  Rec Pantoprazole (protonix) 40 mg   Take  30-60 min before first meal of the day and Pepcid (famotidine)  20 mg after supper until return to office   GERD diet reviewed, bed blocks rec  CHLORPHENIRAMINE  4 mg  ("Allergy Relief" 58m   For breathing or can't stop coughing  > albuterol 2 puff every 4 hours as needed but work on smooth deep breath like we showed you here. Depomedrol 120 mg IM - if much better for 5 days then worse again, I will call you in new inhaler Please schedule a follow up office visit in 4 weeks, call sooner if needed with all  medications /inhalers/ solutions in hand   08/14/2022  f/u ov/[name] re: cough x spring 2020    maint on ppi q am (no pepcid)  overall much better, still globus sensation/ brought all meds but in one bag not sep maint vs prns Chief Complaint  Patient presents with   Follow-up    Occasional cough with a lot of mucus production.  Clear mucus  Dyspnea:  not limited by doe but very inactive  Cough: feels like it gets hung in throat min white  Sleeping: flat bed / pillows and h1 x 2 helps more than anything else to date SABA use: twice a day not really clear why, not needing noct  02: prn 5 years irritates throat  Covid status:   vax max      No obvious day to day or daytime variability or assoc   mucus plugs or hemoptysis or cp or chest tightness, subjective wheeze or overt sinus or hb symptoms.   Sleeping  without nocturnal  or early am exacerbation  of respiratory  c/o's or  need for noct saba. Also denies any obvious fluctuation of symptoms with weather or environmental changes or other aggravating or alleviating factors except as outlined above   No unusual exposure hx or h/o childhood pna/ asthma or knowledge of premature birth.  Current Allergies, Complete Past Medical History, Past Surgical History, Family History, and Social History were reviewed in [employer] record.  ROS  The following are not active complaints unless bolded Hoarseness, sore throat, dysphagia, dental problems, itching, sneezing,  nasal congestion or discharge of excess mucus or purulent secretions, ear ache,   fever, chills, sweats, unintended wt loss or wt gain, classically pleuritic or exertional cp,  orthopnea pnd or arm/hand swelling  or leg swelling, presyncope, palpitations, abdominal pain, anorexia, nausea, vomiting, diarrhea  or change in bowel habits or change in bladder habits, change in stools or change in urine, dysuria, hematuria,  rash, arthralgias, visual complaints, headache, numbness, weakness or ataxia or problems with walking or coordination,  change in mood or  memory.        Current Meds  Medication Sig   albuterol (VENTOLIN HFA) 108 (90 Base) MCG/ACT inhaler Inhale 2 puffs into the lungs every 6 (six) hours as needed for wheezing or shortness of breath.   Ascorbic Acid (VITAMIN C) 1000 MG tablet Take 1,000 mg by mouth daily.   aspirin EC 81 MG tablet Take 1 tablet (81 mg total) by mouth 2 (two) times daily.   azelastine (ASTELIN) 0.1 % nasal spray 2 sprays each nostril 2 times daily for drainage control.   Bacillus Coagulans-Inulin (PROBIOTIC FORMULA PO) Take 1 capsule by mouth 2 (two) times a week.   carvedilol (COREG) 3.125 MG tablet Take 1 tablet (3.125 mg total) by mouth 2 (two) times daily.   Cholecalciferol (VITAMIN D) 50 MCG (2000 UT) tablet Take 2,000 Units by mouth daily.   clotrimazole-betamethasone (LOTRISONE) cream Apply topically 2  (two) times daily as needed.   Coenzyme Q10 (COQ10) 100 MG CAPS Take 100 mg by mouth daily with lunch.    estradiol (ESTRACE) 0.1 MG/GM vaginal cream Place 1 Applicatorful vaginally daily as needed.   famotidine (PEPCID) 20 MG tablet Take 1 tablet (20 mg total) by mouth 2 (two) times daily.   fluticasone (FLONASE) 50 MCG/ACT nasal spray Place 1 spray into both nostrils daily as needed for allergies or rhinitis.   furosemide (LASIX) 20 MG tablet Take 1 tablet (20 mg  total) by mouth daily as needed (fluid).   ipratropium (ATROVENT) 0.06 % nasal spray Place 2 sprays into both nostrils 4 (four) times daily.   metFORMIN (GLUCOPHAGE) 500 MG tablet Take 1 tablet (500 mg total) by mouth 2 (two) times daily with a meal.   OneTouch Delica Lancets 99991111 MISC USE   TO CHECK GLUCOSE BEFORE BREAKFAST AND  DINNER   ONETOUCH VERIO test strip USE 1 STRIP TO CHECK GLUCOSE TWICE DAILY   OXYGEN Inhale 1 Angelica into the lungs at bedtime as needed (oxygen).   pantoprazole (PROTONIX) 40 MG tablet Take 1 tablet (40 mg total) by mouth daily.   potassium chloride SA (KLOR-CON M) 20 MEQ tablet TAKE 1 TABLET BY MOUTH ONCE DAILY AS NEEDED WITH EACH DOSE OF HYDROCHLOROTHIAZIDE   rosuvastatin (CRESTOR) 20 MG tablet Take 20 mg by mouth daily.   valsartan-hydrochlorothiazide (DIOVAN HCT) 80-12.5 MG tablet Take 1 tablet by mouth daily.                      Past Medical History:  Diagnosis Date   Arthritis    hands   Bladder infection    Carpal tunnel syndrome    Complication of anesthesia    Difficult to arouse   Diabetes mellitus    GERD (gastroesophageal reflux disease)    Hypertension    Sleep apnea         Objective:    wts   08/14/2022       211   07/17/22 215 lb 9.6 oz (97.8 kg)  07/10/22 214 lb (97.1 kg)  06/05/22 214 lb 12.8 oz (97.4 kg)     Vital signs reviewed  08/14/2022  - Note at rest 02 sats  97% on RA   General appearance:    amb mod obese (by bmi) bf nad   HEENT : Oropharynx  clear/ no  rattling on voluntary cough         NECK :  without  apparent JVD/ palpable Nodes/TM    LUNGS: no acc muscle use,  Nl contour chest which is clear to A and P bilaterally without cough on insp or exp maneuvers   CV:  RRR  no s3 or murmur or increase in P2, and no edema   ABD:  obese soft and nontender   MS:  Nl gait/ ext warm without deformities Or obvious joint restrictions  calf tenderness, cyanosis or clubbing    SKIN: warm and dry without lesions    NEURO:  alert, approp, nl sensorium with  no motor or cerebellar deficits apparent.         Assessment

## 2022-08-14 ENCOUNTER — Encounter: Payer: Self-pay | Admitting: Internal Medicine

## 2022-08-14 ENCOUNTER — Ambulatory Visit: Payer: Medicare HMO | Admitting: Internal Medicine

## 2022-08-14 VITALS — BP 130/76 | HR 63 | Temp 97.9°F | Ht 63.0 in | Wt 211.0 lb

## 2022-08-14 DIAGNOSIS — J45991 Cough variant asthma: Secondary | ICD-10-CM

## 2022-08-14 MED ORDER — FAMOTIDINE 20 MG PO TABS: ORAL_TABLET | ORAL | 1 refills | Status: DC

## 2022-08-14 NOTE — Assessment & Plan Note (Signed)
Onset worsening of chronic rhinitis spring 2020 follow by onset of chronic cough  - [facility]get Allergy w/u  11/29/21 :   strongly Pos IgE 559 multiple pos RAST  dust mite, grass pollen, cockroach, molds, tree pollen and weed pollen    but skin testing neg > no better on allegra/ singulair, atrovent nasa spray - 06/05/2022   Walked on RA  x  3  lap(s) =  approx 750  ft  @ fast pace, stopped due to end of study  with lowest 02 sats 98% s sob /cough /cp   - FENO 06/05/2022  = 11 - 06/05/2022  After extensive coaching inhaler device,  effectiveness =   50% from baseline near 0  - rx max gerd rx plus 1st gen H1 blockers per guidelines  06/05/2022 >>> did not follow instructions  - 07/17/2022 depomedrol 120 mg IM and max 1st gen h1/hard rock candy to stifle urge to cough    08/14/2022  After extensive coaching inhaler device,  effectiveness =    75% from  a baseline of 25% (short ti)  Much better esp at hs but still has globus sensation so rec add pepcid 20 mg hs and mucinex dm 2 bid with more approp saba (since I don't think this is really asthma):  Re SABA :  I spent extra time with pt today reviewing appropriate use of albuterol for prn use on exertion with the following points: 1) saba is for relief of sob that does not improve by walking a slower pace or resting but rather if the pt does not improve after trying this first. 2) If the pt is convinced, as many are, that saba helps recover from activity faster then it's easy to tell if this is the case by re-challenging : ie stop, take the inhaler, then p 5 minutes try the exact same activity (intensity of workload) that just caused the symptoms and see if they are substantially diminished or not after saba 3) if there is an activity that reproducibly causes the symptoms, try the saba 15 min before the activity on alternate days   If in fact the saba really does help, then fine to continue to use it prn but advised may need to look closer at the maintenance  regimen being used to achieve better control of airways disease with exertion.   F/u 3 m with consideration for ent eval/ [facility] if not improving on above rx         Each maintenance medication was reviewed in exhaustive detail today including emphasizing most importantly the difference between maintenance and prns and under what circumstances the prns are to be triggered using an action plan format where appropriate.  Total time for H and P, chart review, counseling, reviewing hfa device(s) and generating customized AVS unique to this office visit / same day charting = 35 min

## 2022-08-14 NOTE — Patient Instructions (Addendum)
No change in medication except add pepcid 20 mg after supper   For cough mucinex dm 1200 twice daily with a glass of water  Only use your albuterol as a rescue medication to be used if you can't catch your breath by resting or doing a relaxed purse lip breathing pattern.  - The less you use it, the better it will work when you need it. - Ok to use up to 2 puffs  every 4 hours if you must but call for immediate appointment if use goes up over your usual need - Don't leave home without it !!  (think of it like the spare tire for your car)   Also  Ok to try albuterol 15 min before an activity (on alternating days)  that you know would usually make you short of breath and see if it makes any difference and if makes none then don't take albuterol after activity unless you can't catch your breath as this means it's the resting that helps, not the albuterol.     Work on inhaler technique:  relax and gently blow all the way out then take a nice smooth full deep breath back in, triggering the inhaler at same time you start breathing in.  Hold breath in for at least  5 seconds if you can.      Please schedule a follow up visit in 3 months but call sooner if needed  Add: next step is ent eval/ [facility]

## 2022-09-02 DIAGNOSIS — R0600 Dyspnea, unspecified: Secondary | ICD-10-CM | POA: Diagnosis not present

## 2022-09-02 DIAGNOSIS — G4733 Obstructive sleep apnea (adult) (pediatric): Secondary | ICD-10-CM | POA: Diagnosis not present

## 2022-09-02 DIAGNOSIS — R0602 Shortness of breath: Secondary | ICD-10-CM | POA: Diagnosis not present

## 2022-09-02 DIAGNOSIS — I10 Essential (primary) hypertension: Secondary | ICD-10-CM | POA: Diagnosis not present

## 2022-09-17 ENCOUNTER — Ambulatory Visit (INDEPENDENT_AMBULATORY_CARE_PROVIDER_SITE_OTHER): Payer: Medicare HMO | Admitting: Internal Medicine

## 2022-09-17 ENCOUNTER — Encounter: Payer: Self-pay | Admitting: Internal Medicine

## 2022-09-17 ENCOUNTER — Other Ambulatory Visit: Payer: Self-pay | Admitting: Internal Medicine

## 2022-09-17 VITALS — BP 124/80 | HR 54 | Temp 97.9°F | Ht 63.0 in | Wt 211.2 lb

## 2022-09-17 DIAGNOSIS — E1169 Type 2 diabetes mellitus with other specified complication: Secondary | ICD-10-CM | POA: Diagnosis not present

## 2022-09-17 DIAGNOSIS — R82998 Other abnormal findings in urine: Secondary | ICD-10-CM | POA: Diagnosis not present

## 2022-09-17 DIAGNOSIS — Z0001 Encounter for general adult medical examination with abnormal findings: Secondary | ICD-10-CM

## 2022-09-17 DIAGNOSIS — E1165 Type 2 diabetes mellitus with hyperglycemia: Secondary | ICD-10-CM | POA: Diagnosis not present

## 2022-09-17 DIAGNOSIS — E785 Hyperlipidemia, unspecified: Secondary | ICD-10-CM

## 2022-09-17 DIAGNOSIS — Z2821 Immunization not carried out because of patient refusal: Secondary | ICD-10-CM

## 2022-09-17 DIAGNOSIS — Z0000 Encounter for general adult medical examination without abnormal findings: Secondary | ICD-10-CM

## 2022-09-17 DIAGNOSIS — Z6837 Body mass index (BMI) 37.0-37.9, adult: Secondary | ICD-10-CM

## 2022-09-17 DIAGNOSIS — I700 Atherosclerosis of aorta: Secondary | ICD-10-CM | POA: Diagnosis not present

## 2022-09-17 DIAGNOSIS — I119 Hypertensive heart disease without heart failure: Secondary | ICD-10-CM

## 2022-09-17 LAB — POCT URINALYSIS DIPSTICK
Bilirubin, UA: NEGATIVE
Glucose, UA: NEGATIVE
Ketones, UA: NEGATIVE
Nitrite, UA: NEGATIVE
Protein, UA: NEGATIVE
Spec Grav, UA: 1.025 (ref 1.010–1.025)
Urobilinogen, UA: 0.2 E.U./dL
pH, UA: 6 (ref 5.0–8.0)

## 2022-09-17 NOTE — Patient Instructions (Signed)

## 2022-09-17 NOTE — Progress Notes (Signed)
I,[name],acting as a scribe for Gwynneth Alimentobyn N Lisa Milian, MD.,have documented all relevant documentation on the behalf of Gwynneth Alimentobyn N Trina Asch, MD,as directed by  Gwynneth Alimentobyn N Curby Carswell, MD while in the presence of Gwynneth Alimentobyn N Aniello Christopoulos, MD.   Subjective:     Patient ID: Angelica KernsShirley A [name] , female    DOB: 06-Sep-1945 , 77 y.o.   MRN: [id number]   Chief Complaint  Patient presents with   Annual Exam   Diabetes   Hypertension    HPI  She is here today for a full physical examination. She is followed by GYN for her pelvic exams. She reports being confused on what medications she should be taking for her blood pressure.  She has brought in a bag of meds to review.   She denies having any headache, SOB, blurred vision,  and pain. She is happy to report she has increase her daily activity level.   Diabetes She presents for her follow-up diabetic visit. She has type 2 diabetes mellitus. Her disease course has been stable. There are no hypoglycemic associated symptoms. Tremors: . Pertinent negatives for diabetes include no blurred vision and no chest pain. There are no hypoglycemic complications. Risk factors for coronary artery disease include dyslipidemia, diabetes mellitus, hypertension, obesity, post-menopausal and sedentary lifestyle. She is following a diabetic diet. She participates in exercise intermittently. Her breakfast blood glucose is taken between 8-9 am. Her breakfast blood glucose range is generally 110-130 mg/dl. An ACE inhibitor/angiotensin II receptor blocker is being taken. Eye exam is current.  Hypertension This is a chronic problem. The current episode started more than 1 year ago. The problem has been gradually improving since onset. The problem is controlled. Pertinent negatives include no blurred vision, chest pain, palpitations or shortness of breath. Risk factors for coronary artery disease include diabetes mellitus, dyslipidemia, obesity, post-menopausal state and sedentary lifestyle. The  current treatment provides moderate improvement. Compliance problems include exercise.      Past Medical History:  Diagnosis Date   Arthritis    hands   Bladder infection    Carpal tunnel syndrome    Complication of anesthesia    Difficult to arouse   Diabetes mellitus    GERD (gastroesophageal reflux disease)    Hypertension    Sleep apnea      Family History  Problem Relation Age of Onset   Diabetes Mother    Hypertension Mother    Asthma Father    Heart attack Father [id number]   Stroke Father [id number]   Diabetes Father    Breast cancer Sister [phone]   Allergic rhinitis Brother    Diabetes Brother    Diabetes Brother    Atopy Paternal Grandmother      Current Outpatient Medications:    albuterol (VENTOLIN HFA) 108 (90 Base) MCG/ACT inhaler, Inhale 2 puffs into the lungs every 6 (six) hours as needed for wheezing or shortness of breath., Disp: 18 g, Rfl: 2   Ascorbic Acid (VITAMIN C) 1000 MG tablet, Take 1,000 mg by mouth daily., Disp: , Rfl:    aspirin EC 81 MG tablet, Take 1 tablet (81 mg total) by mouth 2 (two) times daily., Disp: 60 tablet, Rfl: 0   azelastine (ASTELIN) 0.1 % nasal spray, 2 sprays each nostril 2 times daily for drainage control., Disp: 30 mL, Rfl: 5   Bacillus Coagulans-Inulin (PROBIOTIC FORMULA PO), Take 1 capsule by mouth 2 (two) times a week., Disp: , Rfl:    carvedilol (COREG) 3.125 MG tablet, Take 1 tablet (  3.125 mg total) by mouth 2 (two) times daily., Disp: 180 tablet, Rfl: 1   Cholecalciferol (VITAMIN D) 50 MCG (2000 UT) tablet, Take 2,000 Units by mouth daily., Disp: , Rfl:    clotrimazole-betamethasone (LOTRISONE) cream, Apply topically 2 (two) times daily as needed., Disp: 30 g, Rfl: 1   Coenzyme Q10 (COQ10) 100 MG CAPS, Take 100 mg by mouth daily with lunch. , Disp: , Rfl:    estradiol (ESTRACE) 0.1 MG/GM vaginal cream, Place 1 Applicatorful vaginally daily as needed., Disp: , Rfl:    famotidine (PEPCID) 20 MG tablet, One after supper, Disp: 90 tablet,  Rfl: 1   fluticasone (FLONASE) 50 MCG/ACT nasal spray, Place 1 spray into both nostrils daily as needed for allergies or rhinitis., Disp: , Rfl:    furosemide (LASIX) 20 MG tablet, Take 1 tablet (20 mg total) by mouth daily as needed (fluid)., Disp: 90 tablet, Rfl: 1   ipratropium (ATROVENT) 0.06 % nasal spray, Place 2 sprays into both nostrils 4 (four) times daily., Disp: , Rfl:    metFORMIN (GLUCOPHAGE) 500 MG tablet, Take 1 tablet (500 mg total) by mouth 2 (two) times daily with a meal., Disp: 180 tablet, Rfl: 2   OneTouch Delica Lancets 33G MISC, USE   TO CHECK GLUCOSE BEFORE BREAKFAST AND  DINNER, Disp: 100 each, Rfl: 0   ONETOUCH VERIO test strip, USE 1 STRIP TO CHECK GLUCOSE TWICE DAILY, Disp: 100 each, Rfl: 0   OXYGEN, Inhale 1 L into the lungs at bedtime as needed (oxygen)., Disp: , Rfl:    potassium chloride SA (KLOR-CON M) 20 MEQ tablet, TAKE 1 TABLET BY MOUTH ONCE DAILY AS NEEDED WITH EACH DOSE OF HYDROCHLOROTHIAZIDE, Disp: 90 tablet, Rfl: 0   rosuvastatin (CRESTOR) 20 MG tablet, Take 20 mg by mouth daily., Disp: , Rfl:    valsartan-hydrochlorothiazide (DIOVAN-HCT) 80-12.5 MG tablet, Take 1 tablet by mouth daily., Disp: , Rfl:    nystatin (MYCOSTATIN/NYSTOP) powder, Apply 1 Application topically 2 (two) times daily. To affected area(s), Disp: 60 g, Rfl: 1   pantoprazole (PROTONIX) 40 MG tablet, Take 1 tablet by mouth once daily, Disp: 30 tablet, Rfl: 0   Allergies  Allergen Reactions   Pravastatin Hives   Haemophilus Influenzae Vaccines     Nausea, pain, body weakness   Sulfa Antibiotics Itching   Sulfonamide Derivatives Itching   Tetanus Toxoid, Adsorbed Other (See Comments)   Tetanus Toxoids Swelling    Arm swelled at injection site      The patient states she uses post menopausal status for birth control. Last LMP was No LMP recorded. Patient has had a hysterectomy.. Negative for Dysmenorrhea. Negative for: breast discharge, breast lump(s), breast pain and breast self exam.  Associated symptoms include abnormal vaginal bleeding. Pertinent negatives include abnormal bleeding (hematology), anxiety, decreased libido, depression, difficulty falling sleep, dyspareunia, history of infertility, nocturia, sexual dysfunction, sleep disturbances, urinary incontinence, urinary urgency, vaginal discharge and vaginal itching. Diet regular.The patient states her exercise level is  moderate- she is now going to the gym.   . The patient's tobacco use is:  Social History   Tobacco Use  Smoking Status Never   Passive exposure: Never  Smokeless Tobacco Never  . She has been exposed to passive smoke. The patient's alcohol use is:  Social History   Substance and Sexual Activity  Alcohol Use No    Review of Systems  Constitutional: Negative.   HENT: Negative.    Eyes: Negative.  Negative for blurred vision.  Respiratory:  Negative.  Negative for shortness of breath.   Cardiovascular: Negative.  Negative for chest pain and palpitations.  Gastrointestinal: Negative.   Endocrine: Negative.   Genitourinary: Negative.   Musculoskeletal: Negative.   Skin: Negative.   Allergic/Immunologic: Negative.   Neurological: Negative.  Tremors: .  Hematological: Negative.   Psychiatric/Behavioral: Negative.       Today's Vitals   09/17/22 1123 09/17/22 1211  BP: (!) 140/80 124/80  Pulse: (!) 54   Temp: 97.9 F (36.6 C)   SpO2: 98%   Weight: 211 lb 3.2 oz (95.8 kg)   Height: 5\' 3"  (1.6 m)    Body mass index is 37.41 kg/m.  Wt Readings from Last 3 Encounters:  09/24/22 211 lb (95.7 kg)  09/17/22 211 lb 3.2 oz (95.8 kg)  08/14/22 211 lb (95.7 kg)    BP Readings from Last 3 Encounters:  09/24/22 120/66  09/17/22 124/80  08/14/22 130/76   Objective:  Physical Exam Vitals and nursing note reviewed.  Constitutional:      Appearance: Normal appearance. She is obese.  HENT:     Head: Normocephalic and atraumatic.     Right Ear: Tympanic membrane, ear canal and external ear  normal.     Left Ear: Tympanic membrane, ear canal and external ear normal.     Nose:     Comments: Masked     Mouth/Throat:     Comments: Masked  Eyes:     Extraocular Movements: Extraocular movements intact.     Conjunctiva/sclera: Conjunctivae normal.     Pupils: Pupils are equal, round, and reactive to light.  Cardiovascular:     Rate and Rhythm: Normal rate and regular rhythm.     Pulses: Normal pulses.          Dorsalis pedis pulses are 2+ on the right side and 2+ on the left side.     Heart sounds: Normal heart sounds.  Pulmonary:     Effort: Pulmonary effort is normal.     Breath sounds: Normal breath sounds.  Chest:  Breasts:    Tanner Score is 5.     Right: Normal.     Left: Normal.  Abdominal:     General: Bowel sounds are normal.     Palpations: Abdomen is soft.     Comments: Obese, soft  Genitourinary:    Comments: deferred Musculoskeletal:        General: Normal range of motion.     Cervical back: Normal range of motion and neck supple.  Feet:     Right foot:     Protective Sensation: 5 sites tested.  5 sites sensed.     Skin integrity: Dry skin present.     Toenail Condition: Right toenails are normal.     Left foot:     Protective Sensation: 5 sites tested.  5 sites sensed.     Skin integrity: Dry skin present.     Toenail Condition: Left toenails are normal.  Skin:    General: Skin is warm and dry.  Neurological:     General: No focal deficit present.     Mental Status: She is alert and oriented to person, place, and time.  Psychiatric:        Mood and Affect: Mood normal.        Behavior: Behavior normal.      Assessment And Plan:     1. Routine general medical examination at a health care facility Comments: A full exam was performed.  Importance of monthly self breast exams was discussed with the patient.  PATIENT IS ADVISED TO GET 30-45 MINUTES REGULAR EXERCISE NO LESS THAN FOUR TO FIVE DAYS PER WEEK - BOTH WEIGHTBEARING EXERCISES AND AEROBIC  ARE RECOMMENDED.  PATIENT IS ADVISED TO FOLLOW A HEALTHY DIET WITH AT LEAST SIX FRUITS/VEGGIES PER DAY, DECREASE INTAKE OF RED MEAT, AND TO INCREASE FISH INTAKE TO TWO DAYS PER WEEK.  MEATS/FISH SHOULD NOT BE FRIED, BAKED OR BROILED IS PREFERABLE.  IT IS ALSO IMPORTANT TO CUT BACK ON YOUR SUGAR INTAKE. PLEASE AVOID ANYTHING WITH ADDED SUGAR, CORN SYRUP OR OTHER SWEETENERS. IF YOU MUST USE A SWEETENER, YOU CAN TRY STEVIA. IT IS ALSO IMPORTANT TO AVOID ARTIFICIALLY SWEETENERS AND DIET BEVERAGES. LASTLY, I SUGGEST WEARING SPF 50 SUNSCREEN ON EXPOSED PARTS AND ESPECIALLY WHEN IN THE DIRECT SUNLIGHT FOR AN EXTENDED PERIOD OF TIME.  PLEASE AVOID FAST FOOD RESTAURANTS AND INCREASE YOUR WATER INTAKE.  2. Dyslipidemia associated with type 2 diabetes mellitus (HCC) Comments: Chronic, diabetic foot exam was performed. She will rto in 4 months for re-evaluation, encouraged to limit her intake of sugary/diet beverages.  I DISCUSSED WITH THE PATIENT AT LENGTH REGARDING THE GOALS OF GLYCEMIC CONTROL AND POSSIBLE LONG-TERM COMPLICATIONS.  I  ALSO STRESSED THE IMPORTANCE OF COMPLIANCE WITH HOME GLUCOSE MONITORING, DIETARY RESTRICTIONS INCLUDING AVOIDANCE OF SUGARY DRINKS/PROCESSED FOODS,  ALONG WITH REGULAR EXERCISE.  I  ALSO STRESSED THE IMPORTANCE OF ANNUAL EYE EXAMS, SELF FOOT CARE AND COMPLIANCE WITH OFFICE VISITS.  - POCT Urinalysis Dipstick (81002) - Microalbumin / creatinine urine ratio - CMP14+EGFR - CBC - Hemoglobin A1c - Lipid panel  3. Hypertensive heart disease without heart failure Comments: Chronic, controlled. She brought her meds in w/ her today. Her meds were reviewed in full detail. She had duplicate meds. She agreed to disposal of losartan, hctz and valsartan 80. She will c/w valsartan/hctz combo pill. EKG performed, SB w/ HR 55, nonspecific T abnormality. She will rto in 4 months for re-evaluation.  - POCT Urinalysis Dipstick (81002) - Microalbumin / creatinine urine ratio - EKG 12-Lead -  valsartan-hydrochlorothiazide (DIOVAN-HCT) 80-12.5 MG tablet; Take 1 tablet by mouth daily. - CMP14+EGFR  4. Abdominal aortic atherosclerosis (HCC) Comments: Chronic, LDL goal < 70. She will c/w ASA and rosuvastatin 20mg  daily.  She is encouraged to follow a heart healthy lifestyle.  5. Leukocytes in urine Comments: She has h/o recurrent UTIs. I will check urine culture today. - Culture, Urine  6. Class 2 severe obesity due to excess calories with serious comorbidity and body mass index (BMI) of 37.0 to 37.9 in adult [facility]) Comments: She was congratulated on her 4lb weight loss since Jan 2024. She is encouraged to aim for at least 150 minutes of exercise/week, while striving for BMI<30.  7. Herpes zoster vaccination declined  Patient was given opportunity to ask questions. Patient verbalized understanding of the plan and was able to repeat key elements of the plan. All questions were answered to their satisfaction.   I, [name], MD, have reviewed all documentation for this visit. The documentation on 09/17/22 for the exam, diagnosis, procedures, and orders are all accurate and complete.   THE PATIENT IS ENCOURAGED TO PRACTICE SOCIAL DISTANCING DUE TO THE COVID-19 PANDEMIC.

## 2022-09-18 LAB — CBC
Hematocrit: 37.5 % (ref 34.0–46.6)
Hemoglobin: 12.1 g/dL (ref 11.1–15.9)
MCH: 28.6 pg (ref 26.6–33.0)
MCHC: 32.3 g/dL (ref 31.5–35.7)
MCV: 89 fL (ref 79–97)
Platelets: 260 10*3/uL (ref 150–450)
RBC: 4.23 x10E6/uL (ref 3.77–5.28)
RDW: 13.8 % (ref 11.7–15.4)
WBC: 5 10*3/uL (ref 3.4–10.8)

## 2022-09-18 LAB — LIPID PANEL
Chol/HDL Ratio: 2.8 ratio (ref 0.0–4.4)
Cholesterol, Total: 225 mg/dL — ABNORMAL HIGH (ref 100–199)
HDL: 81 mg/dL (ref >39)
LDL Chol Calc (NIH): 131 mg/dL — ABNORMAL HIGH (ref 0–99)
Triglycerides: 73 mg/dL (ref 0–149)
VLDL Cholesterol Cal: 13 mg/dL (ref 5–40)

## 2022-09-18 LAB — CMP14+EGFR
ALT: 10 IU/L (ref 0–32)
AST: 16 IU/L (ref 0–40)
Albumin/Globulin Ratio: 1.8 (ref 1.2–2.2)
Albumin: 4.2 g/dL (ref 3.8–4.8)
Alkaline Phosphatase: 95 IU/L (ref 44–121)
BUN/Creatinine Ratio: 14 (ref 12–28)
BUN: 10 mg/dL (ref 8–27)
Bilirubin Total: 0.5 mg/dL (ref 0.0–1.2)
CO2: 23 mmol/L (ref 20–29)
Calcium: 9.3 mg/dL (ref 8.7–10.3)
Chloride: 106 mmol/L (ref 96–106)
Creatinine, Ser: 0.7 mg/dL (ref 0.57–1.00)
Globulin, Total: 2.4 g/dL (ref 1.5–4.5)
Glucose: 98 mg/dL (ref 70–99)
Potassium: 4.6 mmol/L (ref 3.5–5.2)
Sodium: 146 mmol/L — ABNORMAL HIGH (ref 134–144)
Total Protein: 6.6 g/dL (ref 6.0–8.5)
eGFR: 89 mL/min/{1.73_m2} (ref >59)

## 2022-09-18 LAB — HEMOGLOBIN A1C
Est. average glucose Bld gHb Est-mCnc: 140 mg/dL
Hgb A1c MFr Bld: 6.5 % — ABNORMAL HIGH (ref 4.8–5.6)

## 2022-09-18 LAB — MICROALBUMIN / CREATININE URINE RATIO
Creatinine, Urine: 108.7 mg/dL
Microalb/Creat Ratio: 10 mg/g creat (ref 0–29)
Microalbumin, Urine: 11.1 ug/mL

## 2022-09-18 LAB — URINE CULTURE

## 2022-09-24 ENCOUNTER — Ambulatory Visit: Payer: Medicare HMO

## 2022-09-24 ENCOUNTER — Other Ambulatory Visit: Payer: Self-pay | Admitting: Internal Medicine

## 2022-09-24 VITALS — BP 120/66 | HR 75 | Temp 98.5°F | Ht 63.0 in | Wt 211.0 lb

## 2022-09-24 DIAGNOSIS — I10 Essential (primary) hypertension: Secondary | ICD-10-CM

## 2022-09-24 MED ORDER — NYSTATIN 100000 UNIT/GM EX POWD: 1.0000 | Freq: Two times a day (BID) | CUTANEOUS | 1 refills | Status: DC

## 2022-09-24 NOTE — Progress Notes (Signed)
Patient presents today for a BP check, patient currently taking valsartan-HCTZ 80-12.5mg . Patient reports not taking any bp meds today.  BP Readings from Last 3 Encounters:  09/24/22 118/68  09/17/22 124/80  08/14/22 130/76  Per provider- this is wonderful

## 2022-10-03 DIAGNOSIS — R0600 Dyspnea, unspecified: Secondary | ICD-10-CM | POA: Diagnosis not present

## 2022-10-03 DIAGNOSIS — I10 Essential (primary) hypertension: Secondary | ICD-10-CM | POA: Diagnosis not present

## 2022-10-03 DIAGNOSIS — G4733 Obstructive sleep apnea (adult) (pediatric): Secondary | ICD-10-CM | POA: Diagnosis not present

## 2022-10-03 DIAGNOSIS — R0602 Shortness of breath: Secondary | ICD-10-CM | POA: Diagnosis not present

## 2022-10-08 ENCOUNTER — Telehealth: Payer: Self-pay

## 2022-10-08 NOTE — Patient Outreach (Signed)
  Care Coordination   10/08/2022 Name: [name] MRN: [id number] DOB: 17-Oct-1945   Care Coordination Outreach Attempts:  An unsuccessful telephone outreach was attempted today to offer the patient information about available care coordination services as a benefit of their health plan.   Follow Up Plan:  Additional outreach attempts will be made to offer the patient care coordination information and services.   Encounter Outcome:  No Answer   Care Coordination Interventions:  No, not indicated    Bevelyn Ngo, BSW, CDP Social Worker, Certified Dementia Practitioner [facility] Care Management  Care Coordination [phone]

## 2022-10-14 ENCOUNTER — Telehealth: Payer: Self-pay

## 2022-10-14 NOTE — Patient Outreach (Signed)
  Care Coordination   10/14/2022 Name: [name] MRN: [id number] DOB: Sep 07, 1945   Care Coordination Outreach Attempts:  A second unsuccessful outreach was attempted today to offer the patient with information about available care coordination services as a benefit of their health plan.     Follow Up Plan:  Additional outreach attempts will be made to offer the patient care coordination information and services.   Encounter Outcome:  No Answer   Care Coordination Interventions:  No, not indicated    Bevelyn Ngo, BSW, CDP Social Worker, Certified Dementia Practitioner [facility] Care Management  Care Coordination [phone]

## 2022-10-17 ENCOUNTER — Ambulatory Visit: Payer: Self-pay

## 2022-10-17 NOTE — Patient Outreach (Signed)
  Care Coordination   Initial Visit Note   10/17/2022 Name: [name] MRN: [id number] DOB: [birth year]  [name] is a 77 y.o. year old female who sees Dorothyann Peng, MD for primary care. I spoke with  Sherren Kerns by phone today.  What matters to the patients health and wellness today?  No acute care coordination concerns during today's call   Goals Addressed             This Visit's Progress    COMPLETED: Care Coordination Activities       Care Coordination Interventions: SDoH screening performed - no acute resources challenges identified at this time Determined the patient does not have concerns with medication costs and/or adherence at this time Educated the patient on the role of the care coordination team - no follow up desired at this time Encouraged the patient to contact her primary care provider as needed         SDOH assessments and interventions completed:  Yes  SDOH Interventions Today    Flowsheet Row Most Recent Value  SDOH Interventions   Food Insecurity Interventions Intervention Not Indicated  Housing Interventions Intervention Not Indicated  Transportation Interventions Intervention Not Indicated        Care Coordination Interventions:  Yes, provided   Interventions Today    Flowsheet Row Most Recent Value  Chronic Disease   Chronic disease during today's visit Hypertension (HTN), Diabetes  General Interventions   General Interventions Discussed/Reviewed General Interventions Discussed, Doctor Visits  Doctor Visits Discussed/Reviewed Doctor Visits Reviewed  Education Interventions   Education Provided Provided Education  Provided Verbal Education On Other  [role of care coordination team]        Follow up plan: No further intervention required.   Encounter Outcome:  Pt. Visit Completed   Bevelyn Ngo, BSW, CDP Social Worker, Certified Dementia Practitioner [facility] Care Management  Care Coordination [phone]

## 2022-10-17 NOTE — Patient Instructions (Signed)
Visit Information  Thank you for taking time to visit with me today. Please don't hesitate to contact me if I can be of assistance to you.   Following are the goals we discussed today:  -Contact your primary care provider as needed   If you are experiencing a Mental Health or Behavioral Health Crisis or need someone to talk to, please go to [facility] [address] [place] [phone])  Patient verbalizes understanding of instructions and care plan provided today and agrees to view in MyChart. Active MyChart status and patient understanding of how to access instructions and care plan via MyChart confirmed with patient.     No further follow up required: Please contact the care coordination team as needed  Bevelyn Ngo, Kenard Gower, CDP Social Worker, Certified Dementia Practitioner [facility] Care Management  Care Coordination [phone]

## 2022-10-21 ENCOUNTER — Other Ambulatory Visit: Payer: Self-pay

## 2022-10-21 ENCOUNTER — Emergency Department (HOSPITAL_COMMUNITY)
Admission: EM | Admit: 2022-10-21 | Discharge: 2022-10-21 | Disposition: A | Discharge status: Home / Self Care | Payer: Medicare HMO | Attending: Emergency Medicine | Admitting: Emergency Medicine

## 2022-10-21 DIAGNOSIS — R21 Rash and other nonspecific skin eruption: Secondary | ICD-10-CM

## 2022-10-21 DIAGNOSIS — Z7984 Long term (current) use of oral hypoglycemic drugs: Secondary | ICD-10-CM | POA: Diagnosis not present

## 2022-10-21 DIAGNOSIS — L509 Urticaria, unspecified: Secondary | ICD-10-CM | POA: Insufficient documentation

## 2022-10-21 DIAGNOSIS — Z79899 Other long term (current) drug therapy: Secondary | ICD-10-CM | POA: Diagnosis not present

## 2022-10-21 DIAGNOSIS — M79641 Pain in right hand: Secondary | ICD-10-CM | POA: Diagnosis present

## 2022-10-21 DIAGNOSIS — Z7982 Long term (current) use of aspirin: Secondary | ICD-10-CM | POA: Diagnosis not present

## 2022-10-21 DIAGNOSIS — E119 Type 2 diabetes mellitus without complications: Secondary | ICD-10-CM | POA: Insufficient documentation

## 2022-10-21 DIAGNOSIS — M7989 Other specified soft tissue disorders: Secondary | ICD-10-CM | POA: Insufficient documentation

## 2022-10-21 MED ORDER — DOXYCYCLINE HYCLATE 100 MG PO TABS
100.0000 mg | ORAL_TABLET | Freq: Once | ORAL | Status: AC
  Administered 2022-10-21: 100 mg via ORAL
  Filled 2022-10-21: qty 1

## 2022-10-21 MED ORDER — PREDNISONE 10 MG PO TABS: 20.0000 mg | ORAL_TABLET | Freq: Every day | ORAL | 0 refills | Status: AC

## 2022-10-21 MED ORDER — DOXYCYCLINE HYCLATE 100 MG PO CAPS: 100.0000 mg | ORAL_CAPSULE | Freq: Two times a day (BID) | ORAL | 0 refills | Status: DC

## 2022-10-21 MED ORDER — PREDNISONE 20 MG PO TABS
60.0000 mg | ORAL_TABLET | Freq: Once | ORAL | Status: AC
  Administered 2022-10-21: 60 mg via ORAL
  Filled 2022-10-21: qty 3

## 2022-10-21 NOTE — ED Provider Notes (Signed)
[place] [facility]MENT AT [facility] Provider Note   CSN: [id number] Arrival date & time: 10/21/22  2054     History  Chief Complaint  Patient presents with   Hand Swelling   Rash    [name] is a 77 y.o. female.  Patient here with pain in her right hand and swelling after she was stung by an insect while doing some yard work.  Back of the right hand started to swell and she started having some hives in her right forearm area and groin.  She took Benadryl with not much relief.  She denies any tongue or lip swelling.  No difficulty breathing.  She has a history of acid reflux.  History of high cholesterol.  She is on metformin for diabetes.  She denies any fever or chills.  The history is provided by the patient.       Home Medications Prior to Admission medications   Medication Sig Start Date End Date Taking? Authorizing Provider  albuterol (VENTOLIN HFA) 108 (90 Base) MCG/ACT inhaler Inhale 2 puffs into the lungs every 6 (six) hours as needed for wheezing or shortness of breath. 09/11/21  Yes Dorothyann Peng, MD  Ascorbic Acid (VITAMIN C) 1000 MG tablet Take 1,000 mg by mouth daily.   Yes [provider]  aspirin EC 81 MG tablet Take 1 tablet (81 mg total) by mouth 2 (two) times daily. 05/21/21  Yes Dannielle Burn K, PA-C  azelastine (ASTELIN) 0.1 % nasal spray 2 sprays each nostril 2 times daily for drainage control. 03/21/22  Yes Padgett, Pilar Grammes, MD  Bacillus Coagulans-Inulin (PROBIOTIC FORMULA PO) Take 1 capsule by mouth 2 (two) times a week.   Yes [provider]  carvedilol (COREG) 3.125 MG tablet Take 1 tablet (3.125 mg total) by mouth 2 (two) times daily. 05/21/22  Yes Dorothyann Peng, MD  Cholecalciferol (VITAMIN D) 50 MCG (2000 UT) tablet Take 2,000 Units by mouth daily.   Yes [provider]  clotrimazole-betamethasone (LOTRISONE) cream Apply topically 2 (two) times daily as needed. Patient taking  differently: Apply 1 Application topically 2 (two) times daily as needed (irritation). 03/06/21  Yes Dorothyann Peng, MD  Coenzyme Q10 (COQ10) 100 MG CAPS Take 100 mg by mouth daily with lunch.    Yes [provider]  doxycycline (VIBRAMYCIN) 100 MG capsule Take 1 capsule (100 mg total) by mouth 2 (two) times daily. 10/21/22  Yes Jabarie Pop, DO  estradiol (ESTRACE) 0.1 MG/GM vaginal cream Place 1 Applicatorful vaginally daily as needed (dryness).   Yes [provider]  famotidine (PEPCID) 20 MG tablet One after supper Patient taking differently: Take 20 mg by mouth every evening. One after supper 08/14/22  Yes Nyoka Cowden, MD  fluticasone [facility]) 50 MCG/ACT nasal spray Place 1 spray into both nostrils daily as needed for allergies or rhinitis.   Yes [provider]  furosemide (LASIX) 20 MG tablet Take 1 tablet (20 mg total) by mouth daily as needed (fluid). 06/08/20  Yes Dorothyann Peng, MD  ipratropium (ATROVENT) 0.06 % nasal spray Place 2 sprays into both nostrils 4 (four) times daily.   Yes [provider]  metFORMIN (GLUCOPHAGE) 500 MG tablet Take 1 tablet (500 mg total) by mouth 2 (two) times daily with a meal. 09/11/21  Yes Dorothyann Peng, MD  nystatin (MYCOSTATIN/NYSTOP) powder Apply 1 Application topically 2 (two) times daily. To affected area(s) 09/24/22  Yes Dorothyann Peng, MD  pantoprazole (PROTONIX) 40 MG tablet Take  1 tablet by mouth once daily 09/18/22  Yes Dorothyann Peng, MD  potassium chloride SA (KLOR-CON M) 20 MEQ tablet TAKE 1 TABLET BY MOUTH ONCE DAILY AS NEEDED WITH EACH DOSE OF HYDROCHLOROTHIAZIDE Patient taking differently: Take 20 mEq by mouth daily as needed (low potassium). 11/08/21  Yes Dorothyann Peng, MD  predniSONE (DELTASONE) 10 MG tablet Take 2 tablets (20 mg total) by mouth daily for 3 days. 10/21/22 10/24/22 Yes Joscelyne Renville, DO  rosuvastatin (CRESTOR) 20 MG tablet Take 20 mg by mouth daily.   Yes [provider]   valsartan-hydrochlorothiazide (DIOVAN-HCT) 80-12.5 MG tablet Take 1 tablet by mouth daily. 08/30/22  Yes [provider]  OneTouch Delica Lancets 33G MISC USE   TO CHECK GLUCOSE BEFORE BREAKFAST AND  DINNER 02/27/21   Dorothyann Peng, MD  [facility] VERIO test strip USE 1 STRIP TO CHECK GLUCOSE TWICE DAILY 03/13/22   Dorothyann Peng, MD  OXYGEN Inhale 1 L into the lungs at bedtime as needed (oxygen).    [provider]      Allergies    Pravastatin; Haemophilus influenzae vaccines; Sulfa antibiotics; Sulfonamide derivatives; Tetanus toxoid, adsorbed; and Tetanus toxoids    Review of Systems   Review of Systems  Physical Exam Updated Vital Signs BP (!) 112/91   Pulse 82   Temp 98.8 F (37.1 C) (Oral)   Resp 16   SpO2 100%  Physical Exam Vitals and nursing note reviewed.  Constitutional:      General: She is not in acute distress.    Appearance: She is well-developed. She is not ill-appearing.  HENT:     Head: Normocephalic and atraumatic.     Nose: Nose normal.     Mouth/Throat:     Mouth: Mucous membranes are moist.  Eyes:     Extraocular Movements: Extraocular movements intact.     Conjunctiva/sclera: Conjunctivae normal.     Pupils: Pupils are equal, round, and reactive to light.  Cardiovascular:     Rate and Rhythm: Normal rate and regular rhythm.     Pulses: Normal pulses.     Heart sounds: Normal heart sounds. No murmur heard. Pulmonary:     Effort: Pulmonary effort is normal. No respiratory distress.     Breath sounds: Normal breath sounds.  Abdominal:     Palpations: Abdomen is soft.     Tenderness: There is no abdominal tenderness.  Musculoskeletal:     Cervical back: Neck supple.  Skin:    General: Skin is warm and dry.     Capillary Refill: Capillary refill takes less than 2 seconds.     Findings: Rash present.     Comments: Some redness and edema to the back of the right hand, hives to the right forearm and inguinal folds in her groin   Neurological:     General: No focal deficit present.     Mental Status: She is alert.     ED Results / Procedures / Treatments   Labs (all labs ordered are listed, but only abnormal results are displayed) Labs Reviewed - No data to display  EKG None  Radiology No results found.  Procedures Procedures    Medications Ordered in ED Medications  predniSONE (DELTASONE) tablet 60 mg (60 mg Oral Given 10/21/22 2208)  doxycycline (VIBRA-TABS) tablet 100 mg (100 mg Oral Given 10/21/22 2208)    ED Course/ Medical Decision Making/ A&P  Medical Decision Making Risk Prescription drug management.   KENSLEI HEARTY is here after getting stung by an insect.  Normal vitals.  No fever.  Appears that she has likely allergic process going on in her right hand, right forearm and inguinal folds.  Happened a couple hours ago.  She has no signs to suggest anaphylaxis.  She is taking Benadryl.  Will put her on prednisone for a few days.  Recommend continue use of Benadryl.  To be conservative we will put her on antibiotic.  Overall she appears well.  She has a history of diabetes and hypertension.  She denies any chest pain or shortness of breath or nausea or vomiting.  Overall suspect insect bite with local reaction but no systemic symptoms.  Discharged in good condition.  Understands return precautions.  This chart was dictated using voice recognition software.  Despite best efforts to proofread,  errors can occur which can change the documentation meaning.         Final Clinical Impression(s) / ED Diagnoses Final diagnoses:  Rash    Rx / DC Orders ED Discharge Orders          Ordered    doxycycline (VIBRAMYCIN) 100 MG capsule  2 times daily        10/21/22 2254    predniSONE (DELTASONE) 10 MG tablet  Daily        10/21/22 2254              Virgina Norfolk, DO 10/21/22 2256

## 2022-10-21 NOTE — ED Triage Notes (Signed)
Pt arrives POV c/o R hand swelling/redness and rash on trunk and R arm. States that she was working in the garden bed this morning and that is when she first noted the swelling. Denies being bit by anything that she is aware of. Denies fevers/chills. Took benadryl at 1500 and 1700 without relief. No meds for the pain prior to arrival.

## 2022-10-21 NOTE — Discharge Instructions (Signed)
Continue Benadryl 25 mg every 8 hours as needed for itching.  Take next dose of steroid tomorrow evening.  Take next dose of antibiotic tomorrow morning.

## 2022-10-24 ENCOUNTER — Ambulatory Visit: Payer: Self-pay

## 2022-10-24 ENCOUNTER — Telehealth: Payer: Self-pay

## 2022-10-24 NOTE — Transitions of Care (Post Inpatient/ED Visit) (Signed)
10/24/2022  Name: [name] MRN: [id number] DOB: Feb 14, 1946  Today's TOC FU Call Status: Today's TOC FU Call Status:: Successful TOC FU Call Competed TOC FU Call Complete Date: 10/24/22  Transition Care Management Follow-up Telephone Call Date of Discharge: 10/21/22 Discharge Facility: [facility]) Type of Discharge: Emergency Department Reason for ED Visit: Other: ("rash") How have you been since you were released from the hospital?: Better (Pt states right hand swelling is "almost gone- about 75% back to normal." She still has a "little redness to area and itching-taking Benadryl.") Any questions or concerns?: No  Red on EMMI-ED Discharge Alert Date & Reason:10/23/22 "Scheduled follow-up appt? No"  Items Reviewed: Did you receive and understand the discharge instructions provided?: Yes Medications obtained,verified, and reconciled?: Yes (Medications Reviewed) Any new allergies since your discharge?: No Dietary orders reviewed?: Yes Type of Diet Ordered:: low salt/heart healthy/carb modified Do you have support at home?: No  Medications Reviewed Today: Medications Reviewed Today     Reviewed by Charlyn Minerva, RN (Registered Nurse) on 10/24/22 at 1135  Med List Status: <None>   Medication Order Taking? Sig Documenting Provider Last Dose Status Informant  albuterol (VENTOLIN HFA) 108 (90 Base) MCG/ACT inhaler [id number] No Inhale 2 puffs into the lungs every 6 (six) hours as needed for wheezing or shortness of breath. Dorothyann Peng, MD 10/20/2022 Active Self  Ascorbic Acid (VITAMIN C) 1000 MG tablet [id number] No Take 1,000 mg by mouth daily. [provider] 10/20/2022 Active Self  aspirin EC 81 MG tablet [id number] No Take 1 tablet (81 mg total) by mouth 2 (two) times daily. Allena Katz, PA-C 10/20/2022 Active Self  azelastine (ASTELIN) 0.1 % nasal spray [id number] No 2 sprays each nostril 2 times daily for drainage control. Marcelyn Bruins, MD 10/20/2022 Active Self  Bacillus Coagulans-Inulin (PROBIOTIC FORMULA PO) [id number] No Take 1 capsule by mouth 2 (two) times a week. [provider] Past Week Active Self  carvedilol (COREG) 3.125 MG tablet [id number] No Take 1 tablet (3.125 mg total) by mouth 2 (two) times daily. Dorothyann Peng, MD 10/20/2022 1730 Active Self  Cholecalciferol (VITAMIN D) 50 MCG (2000 UT) tablet [id number] No Take 2,000 Units by mouth daily. [provider] 10/20/2022 Active Self  clotrimazole-betamethasone (LOTRISONE) cream [id number] No Apply topically 2 (two) times daily as needed.  Patient taking differently: Apply 1 Application topically 2 (two) times daily as needed (irritation).   Dorothyann Peng, MD unknown Active Self  Coenzyme Q10 (COQ10) 100 MG CAPS [id number] No Take 100 mg by mouth daily with lunch.  [provider] 10/20/2022 Active Self  doxycycline (VIBRAMYCIN) 100 MG capsule [id number]  Take 1 capsule (100 mg total) by mouth 2 (two) times daily. Virgina Norfolk, DO  Active   estradiol (ESTRACE) 0.1 MG/GM vaginal cream [id number] No Place 1 Applicatorful vaginally daily as needed (dryness). [provider] unknown Active Self           Med Note Cyndie Chime, [facility] I   Mon Oct 21, 2022 10:55 PM)    famotidine (PEPCID) 20 MG tablet [id number] No One after supper  Patient taking differently: Take 20 mg by mouth every evening. One after supper   Nyoka Cowden, MD 10/20/2022 Active Self  fluticasone (FLONASE) 50 MCG/ACT nasal spray [id number] No Place 1 spray into both nostrils daily as needed for allergies or rhinitis. [provider] unknown Active Self  furosemide (LASIX) 20 MG tablet [id number] No Take 1 tablet (20 mg total) by mouth daily  as needed (fluid). Dorothyann Peng, MD unknown Active Self  ipratropium (ATROVENT) 0.06 % nasal spray [id number] No Place 2 sprays into both nostrils 4 (four) times daily. [provider] 10/20/2022 Active Self   metFORMIN (GLUCOPHAGE) 500 MG tablet [id number] No Take 1 tablet (500 mg total) by mouth 2 (two) times daily with a meal. Dorothyann Peng, MD 10/20/2022 Active Self  nystatin (MYCOSTATIN/NYSTOP) powder [id number] No Apply 1 Application topically 2 (two) times daily. To affected area(s) Dorothyann Peng, MD 10/20/2022 Active Self  OneTouch Delica Lancets 33G MISC [id number] No USE   TO CHECK GLUCOSE BEFORE BREAKFAST AND  Guerry Bruin, MD Taking Active Self  [facility] VERIO test strip [id number] No USE 1 STRIP TO CHECK GLUCOSE TWICE DAILY Dorothyann Peng, MD Taking Active Self  OXYGEN [id number] No Inhale 1 L into the lungs at bedtime as needed (oxygen). [provider] Taking Active Self  pantoprazole (PROTONIX) 40 MG tablet [id number] No Take 1 tablet by mouth once daily Dorothyann Peng, MD 10/20/2022 Active Self  potassium chloride SA (KLOR-CON M) 20 MEQ tablet [id number] No TAKE 1 TABLET BY MOUTH ONCE DAILY AS NEEDED WITH EACH DOSE OF HYDROCHLOROTHIAZIDE  Patient taking differently: Take 20 mEq by mouth daily as needed (low potassium).   Dorothyann Peng, MD unknown Active Self  predniSONE (DELTASONE) 10 MG tablet [id number]  Take 2 tablets (20 mg total) by mouth daily for 3 days. Virgina Norfolk, DO  Active   rosuvastatin (CRESTOR) 20 MG tablet [id number] No Take 20 mg by mouth daily. [provider] 10/20/2022 Active Self  valsartan-hydrochlorothiazide (DIOVAN-HCT) 80-12.5 MG tablet [id number] No Take 1 tablet by mouth daily. [provider] 10/20/2022 Active Self            Home Care and Equipment/Supplies: Were Home Health Services Ordered?: NA Any new equipment or medical supplies ordered?: NA  Functional Questionnaire: Do you need assistance with bathing/showering or dressing?: No Do you need assistance with meal preparation?: No Do you need assistance with eating?: No Do you have difficulty maintaining continence: No Do you need assistance with getting out of  bed/getting out of a chair/moving?: No Do you have difficulty managing or taking your medications?: No  Follow up appointments reviewed: PCP Follow-up appointment confirmed?: NA (Pt states she was not advised on d/c instructions to follow up. Discussed with pt if sxs unresolved after abx completion and/or worsening to make PCP appt. She voices understanding.) Specialist Hospital Follow-up appointment confirmed?: NA Do you need transportation to your follow-up appointment?: No Do you understand care options if your condition(s) worsen?: Yes-patient verbalized understanding  SDOH Interventions Today    Flowsheet Row Most Recent Value  SDOH Interventions   Food Insecurity Interventions Intervention Not Indicated  Transportation Interventions Intervention Not Indicated       TOC Interventions Today    Flowsheet Row Most Recent Value  TOC Interventions   TOC Interventions Discussed/Reviewed TOC Interventions Discussed, S/S of infection      Interventions Today    Flowsheet Row Most Recent Value  General Interventions   General Interventions Discussed/Reviewed General Interventions Discussed, Doctor Visits  Doctor Visits Discussed/Reviewed Doctor Visits Discussed, PCP  PCP/Specialist Visits Compliance with follow-up visit  Education Interventions   Education Provided Provided Printed Education  Provided Verbal Education On Medication, When to see the doctor  Nutrition Interventions   Nutrition Discussed/Reviewed Nutrition Discussed, Adding fruits and vegetables, Decreasing salt, Decreasing sugar intake  Pharmacy Interventions   Pharmacy Dicussed/Reviewed Pharmacy Topics Discussed, Medications and their functions  Safety Interventions   Safety Discussed/Reviewed Safety Discussed        Alessandra Grout [facility] Care Management Care Management Community Coordinator Direct Phone: [phone] Toll Free: [phone] Fax: [phone]

## 2022-10-24 NOTE — Chronic Care Management (AMB) (Signed)
   10/24/2022  [name] 1945-08-30 [id number]   Reason for Encounter: Patient is not currently enrolled in the CCM program. CCM status changed to previously enrolled.   Katha Cabal RN Care Manager/Chronic Care Management [phone]

## 2022-10-29 ENCOUNTER — Telehealth: Payer: Self-pay

## 2022-10-29 NOTE — Telephone Encounter (Signed)
Transition Care Management Follow-up Telephone Call Date of discharge and from where: [name] 4/29 How have you been since you were released from the hospital? ok Any questions or concerns? No  Items Reviewed: Did the pt receive and understand the discharge instructions provided? Yes  Medications obtained and verified? Yes  Other? No  Any new allergies since your discharge? No  Dietary orders reviewed? No Do you have support at home? Yes    Follow up appointments reviewed:  PCP Hospital f/u appt confirmed? No  Scheduled to see  on  @ . Specialist Hospital f/u appt confirmed? No  Scheduled to see  on  @ . Are transportation arrangements needed? No  If their condition worsens, is the pt aware to call PCP or go to the Emergency Dept.? Yes  Was the patient provided with contact information for the PCP's office or ED? Yes Was to pt encouraged to call back with questions or concerns? Yes

## 2022-11-02 DIAGNOSIS — G4733 Obstructive sleep apnea (adult) (pediatric): Secondary | ICD-10-CM | POA: Diagnosis not present

## 2022-11-02 DIAGNOSIS — R0600 Dyspnea, unspecified: Secondary | ICD-10-CM | POA: Diagnosis not present

## 2022-11-02 DIAGNOSIS — R0602 Shortness of breath: Secondary | ICD-10-CM | POA: Diagnosis not present

## 2022-11-02 DIAGNOSIS — I10 Essential (primary) hypertension: Secondary | ICD-10-CM | POA: Diagnosis not present

## 2022-11-06 ENCOUNTER — Ambulatory Visit: Payer: Medicare HMO | Admitting: Nurse Practitioner

## 2022-11-13 ENCOUNTER — Encounter: Payer: Self-pay | Admitting: Nurse Practitioner

## 2022-11-13 ENCOUNTER — Ambulatory Visit (INDEPENDENT_AMBULATORY_CARE_PROVIDER_SITE_OTHER): Payer: Medicare HMO | Admitting: Nurse Practitioner

## 2022-11-13 VITALS — BP 120/68 | HR 98 | Temp 98.4°F | Ht 63.0 in | Wt 211.8 lb

## 2022-11-13 DIAGNOSIS — R21 Rash and other nonspecific skin eruption: Secondary | ICD-10-CM | POA: Diagnosis not present

## 2022-11-13 DIAGNOSIS — S60460D Insect bite (nonvenomous) of right index finger, subsequent encounter: Secondary | ICD-10-CM

## 2022-11-13 DIAGNOSIS — Z6837 Body mass index (BMI) 37.0-37.9, adult: Secondary | ICD-10-CM

## 2022-11-13 DIAGNOSIS — T7840XD Allergy, unspecified, subsequent encounter: Secondary | ICD-10-CM

## 2022-11-13 DIAGNOSIS — W57XXXD Bitten or stung by nonvenomous insect and other nonvenomous arthropods, subsequent encounter: Secondary | ICD-10-CM

## 2022-11-13 MED ORDER — EPINEPHRINE 0.3 MG/0.3ML IJ SOAJ
0.3000 mg | INTRAMUSCULAR | 2 refills | Status: AC | PRN
Start: 2022-11-13 — End: ?

## 2022-11-13 NOTE — Progress Notes (Signed)
Hershal Coria Martin,acting as a Neurosurgeon for Arnette Felts, FNP.,have documented all relevant documentation on the behalf of Arnette Felts, FNP,as directed by  Arnette Felts, FNP while in the presence of Arnette Felts, FNP.    Subjective:     Patient ID: [name] , female    DOB: 1945-10-21 , 77 y.o.   MRN: [id number]   Chief Complaint  Patient presents with   Hypertension    HPI  Patient reports she had an insect bite on 10/21/2022 on her finger and she ended up going to the ER. She thinks it may have been ants after she pull back a rock. Patient reports her finger was swollen up, she got hives, and it itched. Patient reports she was told to follow up with PCP.  Patient reports she is much better now with her hand but her body still doesn't feel right. She no longer has any swelling but there is still some mild discoloration.   BP Readings from Last 3 Encounters: 11/13/22 : 120/68 10/21/22 : (!) 140/72 09/24/22 : 120/66       Past Medical History:  Diagnosis Date   Arthritis    hands   Bladder infection    Carpal tunnel syndrome    Complication of anesthesia    Difficult to arouse   Diabetes mellitus    GERD (gastroesophageal reflux disease)    Hypertension    Sleep apnea      Family History  Problem Relation Age of Onset   Diabetes Mother    Hypertension Mother    Asthma Father    Heart attack Father 45   Stroke Father 77   Diabetes Father    Breast cancer Sister 32   Allergic rhinitis Brother    Diabetes Brother    Diabetes Brother    Atopy Paternal Grandmother      Current Outpatient Medications:    albuterol (VENTOLIN HFA) 108 (90 Base) MCG/ACT inhaler, Inhale 2 puffs into the lungs every 6 (six) hours as needed for wheezing or shortness of breath., Disp: 18 g, Rfl: 2   Ascorbic Acid (VITAMIN C) 1000 MG tablet, Take 1,000 mg by mouth daily., Disp: , Rfl:    aspirin EC 81 MG tablet, Take 1 tablet (81 mg total) by mouth 2 (two) times daily., Disp: 60  tablet, Rfl: 0   azelastine (ASTELIN) 0.1 % nasal spray, 2 sprays each nostril 2 times daily for drainage control., Disp: 30 mL, Rfl: 5   Bacillus Coagulans-Inulin (PROBIOTIC FORMULA PO), Take 1 capsule by mouth 2 (two) times a week., Disp: , Rfl:    carvedilol (COREG) 3.125 MG tablet, Take 1 tablet (3.125 mg total) by mouth 2 (two) times daily., Disp: 180 tablet, Rfl: 1   Cholecalciferol (VITAMIN D) 50 MCG (2000 UT) tablet, Take 2,000 Units by mouth daily., Disp: , Rfl:    clotrimazole-betamethasone (LOTRISONE) cream, Apply topically 2 (two) times daily as needed. (Patient taking differently: Apply 1 Application topically 2 (two) times daily as needed (irritation).), Disp: 30 g, Rfl: 1   Coenzyme Q10 (COQ10) 100 MG CAPS, Take 100 mg by mouth daily with lunch. , Disp: , Rfl:    doxycycline (VIBRAMYCIN) 100 MG capsule, Take 1 capsule (100 mg total) by mouth 2 (two) times daily., Disp: 20 capsule, Rfl: 0   EPINEPHrine 0.3 mg/0.3 mL IJ SOAJ injection, Inject 0.3 mg into the muscle as needed for anaphylaxis., Disp: 1 each, Rfl: 2   estradiol (ESTRACE) 0.1 MG/GM vaginal cream, Place 1  Applicatorful vaginally daily as needed (dryness)., Disp: , Rfl:    famotidine (PEPCID) 20 MG tablet, One after supper (Patient taking differently: Take 20 mg by mouth every evening. One after supper), Disp: 90 tablet, Rfl: 1   fluticasone (FLONASE) 50 MCG/ACT nasal spray, Place 1 spray into both nostrils daily as needed for allergies or rhinitis., Disp: , Rfl:    furosemide (LASIX) 20 MG tablet, Take 1 tablet (20 mg total) by mouth daily as needed (fluid)., Disp: 90 tablet, Rfl: 1   ipratropium (ATROVENT) 0.06 % nasal spray, Place 2 sprays into both nostrils 4 (four) times daily., Disp: , Rfl:    metFORMIN (GLUCOPHAGE) 500 MG tablet, Take 1 tablet (500 mg total) by mouth 2 (two) times daily with a meal., Disp: 180 tablet, Rfl: 2   nystatin (MYCOSTATIN/NYSTOP) powder, Apply 1 Application topically 2 (two) times daily. To  affected area(s), Disp: 60 g, Rfl: 1   OneTouch Delica Lancets 33G MISC, USE   TO CHECK GLUCOSE BEFORE BREAKFAST AND  DINNER, Disp: 100 each, Rfl: 0   ONETOUCH VERIO test strip, USE 1 STRIP TO CHECK GLUCOSE TWICE DAILY, Disp: 100 each, Rfl: 0   OXYGEN, Inhale 1 L into the lungs at bedtime as needed (oxygen)., Disp: , Rfl:    pantoprazole (PROTONIX) 40 MG tablet, Take 1 tablet by mouth once daily, Disp: 30 tablet, Rfl: 0   potassium chloride SA (KLOR-CON M) 20 MEQ tablet, TAKE 1 TABLET BY MOUTH ONCE DAILY AS NEEDED WITH EACH DOSE OF HYDROCHLOROTHIAZIDE (Patient taking differently: Take 20 mEq by mouth daily as needed (low potassium).), Disp: 90 tablet, Rfl: 0   rosuvastatin (CRESTOR) 20 MG tablet, Take 20 mg by mouth daily., Disp: , Rfl:    valsartan-hydrochlorothiazide (DIOVAN-HCT) 80-12.5 MG tablet, Take 1 tablet by mouth daily., Disp: , Rfl:    Allergies  Allergen Reactions   Pravastatin Hives   Haemophilus Influenzae Vaccines     Nausea, pain, body weakness   Sulfa Antibiotics Itching   Sulfonamide Derivatives Itching   Tetanus Toxoid, Adsorbed Other (See Comments)   Tetanus Toxoids Swelling    Arm swelled at injection site     Review of Systems  Constitutional: Negative.   Respiratory: Negative.    Cardiovascular: Negative.   Neurological: Negative.   Psychiatric/Behavioral: Negative.       Today's Vitals   11/13/22 0840  BP: 120/68  Pulse: 98  Temp: 98.4 F (36.9 C)  TempSrc: Oral  Weight: 211 lb 12.8 oz (96.1 kg)  Height: 5\' 3"  (1.6 m)  PainSc: 0-No pain   Body mass index is 37.52 kg/m.  Wt Readings from Last 3 Encounters:  11/13/22 211 lb 12.8 oz (96.1 kg)  09/24/22 211 lb (95.7 kg)  09/17/22 211 lb 3.2 oz (95.8 kg)    The 10-year ASCVD risk score (Arnett DK, et al., 2019) is: 42.4%   Values used to calculate the score:     Age: 26 years     Sex: Female     Is Non-Hispanic African American: Yes     Diabetic: Yes     Tobacco smoker: No     Systolic Blood  Pressure: 120 mmHg     Is BP treated: Yes     HDL Cholesterol: 81 mg/dL     Total Cholesterol: 225 mg/dL  Objective:  Physical Exam Vitals reviewed.  Constitutional:      General: She is not in acute distress.    Appearance: Normal appearance. She is obese.  HENT:  Head: Normocephalic.     Right Ear: Hearing, ear canal and external ear normal. Tympanic membrane is bulging.     Left Ear: Hearing, ear canal and external ear normal. Tympanic membrane is bulging.  Cardiovascular:     Rate and Rhythm: Normal rate and regular rhythm.     Pulses: Normal pulses.     Heart sounds: Normal heart sounds. No murmur heard. Pulmonary:     Effort: Pulmonary effort is normal. No respiratory distress.     Breath sounds: Normal breath sounds. No wheezing.  Skin:    General: Skin is warm and dry.     Capillary Refill: Capillary refill takes less than 2 seconds.     Findings: No erythema or rash.     Comments: Slightly darker skin to right base of index finger posteriorly, healing insect bite  Neurological:     General: No focal deficit present.     Mental Status: She is alert and oriented to person, place, and time.     Cranial Nerves: No cranial nerve deficit.     Motor: No weakness.         Assessment And Plan:     1. Insect bite of right index finger, subsequent encounter Comments: She has a small healed scar, slightly darker pigmentation to base of fingers posterior side  2. Rash Comments: No longer has rash but her picture she showed the day of her hand was erthymatous and swollen  3. Allergic reaction, subsequent encounter Comments: she has multiple allergies and due to most recent response will send Rx for epipen, advised if has shortness of breath, tongue or throat swelling to use pen - EPINEPHrine 0.3 mg/0.3 mL IJ SOAJ injection; Inject 0.3 mg into the muscle as needed for anaphylaxis.  Dispense: 1 each; Refill: 2  4. Class 2 severe obesity due to excess calories with serious  comorbidity and body mass index (BMI) of 37.0 to 37.9 in adult [facility]) She is encouraged to strive for BMI less than 30 to decrease cardiac risk. Advised to aim for at least 150 minutes of exercise per week.  Advised to call back to Dr. Thurston Hole office in relation to continuing to have increase in mucous production, explained how allergy response works.   Return if symptoms worsen or fail to improve.   Patient was given opportunity to ask questions. Patient verbalized understanding of the plan and was able to repeat key elements of the plan. All questions were answered to their satisfaction.  Arnette Felts, FNP   I, Arnette Felts, FNP, have reviewed all documentation for this visit. The documentation on 11/13/22 for the exam, diagnosis, procedures, and orders are all accurate and complete.   IF YOU HAVE BEEN REFERRED TO A SPECIALIST, IT MAY TAKE 1-2 WEEKS TO SCHEDULE/PROCESS THE REFERRAL. IF YOU HAVE NOT HEARD FROM US/SPECIALIST IN TWO WEEKS, PLEASE GIVE [place] A CALL AT [phone].   THE PATIENT IS ENCOURAGED TO PRACTICE SOCIAL DISTANCING DUE TO THE COVID-19 PANDEMIC.

## 2022-11-13 NOTE — Patient Instructions (Addendum)
Insect Bite, Adult An insect bite can make your skin red, itchy, and swollen. Some insects can spread disease to people with a bite. However, most insect bites do not lead to disease, and most are not serious. What are the causes? Insects may bite for many reasons, including: Hunger. To defend themselves. Insects that bite include: Spiders. Mosquitoes. Flies. Ticks and fleas. Ants. Kissing bugs. Chiggers. What are the signs or symptoms? Symptoms often last for 2-4 days. However, itching can last up to 10 days. Symptoms include: Itching or pain in the bite area. Redness and swelling in the bite area. An open wound. In rare cases, a person may have a very bad allergic reaction (anaphylactic reaction) to a bite. Symptoms of an anaphylactic reaction may include: Feeling warm in the face (flushed). Your face may turn red. Itchy, red, swollen areas of skin (hives). Swelling of the eyes, lips, face, mouth, tongue, or throat. Trouble with breathing, talking, or swallowing. High-pitched whistling sounds, most often when breathing out (wheezing). Feeling dizzy or light-headed. Fainting. Pain or cramps in your belly (abdomen). Vomiting. Watery poop (diarrhea). How is this treated? Most insect bites are not serious. Symptoms often go away on their own. When treatment is advised, it may include: Putting ice on the bite area. Putting a cream or lotion, like calamine lotion, on the bite area. This helps with itching. Using medicines called antihistamines. You may also need: A tetanus shot if you are not up to date. An antibiotic cream or medicine. This treatment is needed if the bite area gets infected. Follow these instructions at home: Bite area care  Do not scratch the bite area. It may help to cover the bite area with a bandage or close-fitting clothing. Keep the bite area clean and dry. Check the bite area every day for signs of infection. Check for: More redness, swelling, or  pain. Fluid or blood. Warmth. Pus or a bad smell. Wash your hands often. Managing pain, itching, and swelling  You may put any of these on the bite area as told by your doctor: A paste made of baking soda and water. Cortisone cream. Calamine lotion. If told, put ice on the bite area. To do this: Put ice in a plastic bag. Place a towel between your skin and the bag. Leave the ice on for 20 minutes, 2-3 times a day. If your skin turns bright red, take off the ice right away to prevent skin damage. The risk of skin damage is higher if you cannot feel pain, heat, or cold. General instructions Apply or take over-the-counter and prescription medicines only as told by your doctor. If you were prescribed antibiotics, take or apply them as told by your doctor. Do not stop using them even if you start to feel better. How is this prevented? To help you have a lower risk of insect bites: When you are outside, wear clothes that cover your arms and legs. Use insect repellent. The best insect repellents contain one of these: DEET. Picaridin. Oil of lemon eucalyptus (OLE). [id number]. Consider spraying your clothing with a pesticide called permethrin. Permethrin helps prevent insect bites. It works for several weeks and for up to 5-6 clothing washes. Do not apply permethrin directly to the skin. If your home windows do not have screens, think about putting some in. If you will be sleeping in an area where there are mosquitoes, consider covering your sleeping area with a mosquito net. Contact a doctor if: You have redness, swelling, or pain  in the bite area. You have fluid or blood coming from the bite area. The bite area feels warm to the touch. You have pus or a bad smell coming from the bite area. You have a fever. Get help right away if: You have joint pain. You have a rash. You feel weak or more tired than you normally do. You have neck pain or a headache. You have signs of an anaphylactic  reaction. Signs may include: Swelling of your eyes, lips, face, mouth, tongue, or throat. Feeling warm in the face. Itchy, red, swollen areas of skin. Trouble with breathing, talking, or swallowing. Wheezing. Feeling dizzy or light-headed. Fainting. Pain or cramps in your belly. Vomiting or watery poop. These symptoms may be an emergency. Get help right away. Call 911. Do not wait to see if symptoms will go away. Do not drive yourself to the hospital. Summary An insect bite can make your skin red, itchy, and swollen. Treatment is usually not needed. Symptoms often go away on their own. Do not scratch the bite area. Keep it clean and dry. Use insect repellent to help prevent insect bites. Contact a doctor if you have signs of infection. This information is not intended to replace advice given to you by your health care provider. Make sure you discuss any questions you have with your health care provider. Document Revised: 09/04/2021 Document Reviewed: 09/04/2021 Elsevier Patient Education  2023 [employer] Hypertension, Adult Hypertension is another name for high blood pressure. High blood pressure forces your heart to work harder to pump blood. This can cause problems over time. There are two numbers in a blood pressure reading. There is a top number (systolic) over a bottom number (diastolic). It is best to have a blood pressure that is below 120/80. What are the causes? The cause of this condition is not known. Some other conditions can lead to high blood pressure. What increases the risk? Some lifestyle factors can make you more likely to develop high blood pressure: Smoking. Not getting enough exercise or physical activity. Being overweight. Having too much fat, sugar, calories, or salt (sodium) in your diet. Drinking too much alcohol. Other risk factors include: Having any of these conditions: Heart disease. Diabetes. High cholesterol. Kidney disease. Obstructive sleep  apnea. Having a family history of high blood pressure and high cholesterol. Age. The risk increases with age. Stress. What are the signs or symptoms? High blood pressure may not cause symptoms. Very high blood pressure (hypertensive crisis) may cause: Headache. Fast or uneven heartbeats (palpitations). Shortness of breath. Nosebleed. Vomiting or feeling like you may vomit (nauseous). Changes in how you see. Very bad chest pain. Feeling dizzy. Seizures. How is this treated? This condition is treated by making healthy lifestyle changes, such as: Eating healthy foods. Exercising more. Drinking less alcohol. Your doctor may prescribe medicine if lifestyle changes do not help enough and if: Your top number is above 130. Your bottom number is above 80. Your personal target blood pressure may vary. Follow these instructions at home: Eating and drinking  If told, follow the DASH eating plan. To follow this plan: Fill one half of your plate at each meal with fruits and vegetables. Fill one fourth of your plate at each meal with whole grains. Whole grains include whole-wheat pasta, brown rice, and whole-grain bread. Eat or drink low-fat dairy products, such as skim milk or low-fat yogurt. Fill one fourth of your plate at each meal with low-fat (lean) proteins. Low-fat proteins include fish, chicken  without skin, eggs, beans, and tofu. Avoid fatty meat, cured and processed meat, or chicken with skin. Avoid pre-made or processed food. Limit the amount of salt in your diet to less than 1,500 mg each day. Do not drink alcohol if: Your doctor tells you not to drink. You are pregnant, may be pregnant, or are planning to become pregnant. If you drink alcohol: Limit how much you have to: 0-1 drink a day for women. 0-2 drinks a day for men. Know how much alcohol is in your drink. In the [place] one drink equals one 12 oz bottle of beer (355 mL), one 5 oz glass of wine (148 mL), or one 1 oz  glass of hard liquor (44 mL). Lifestyle  Work with your doctor to stay at a healthy weight or to lose weight. Ask your doctor what the best weight is for you. Get at least 30 minutes of exercise that causes your heart to beat faster (aerobic exercise) most days of the week. This may include walking, swimming, or biking. Get at least 30 minutes of exercise that strengthens your muscles (resistance exercise) at least 3 days a week. This may include lifting weights or doing Pilates. Do not smoke or use any products that contain nicotine or tobacco. If you need help quitting, ask your doctor. Check your blood pressure at home as told by your doctor. Keep all follow-up visits. Medicines Take over-the-counter and prescription medicines only as told by your doctor. Follow directions carefully. Do not skip doses of blood pressure medicine. The medicine does not work as well if you skip doses. Skipping doses also puts you at risk for problems. Ask your doctor about side effects or reactions to medicines that you should watch for. Contact a doctor if: You think you are having a reaction to the medicine you are taking. You have headaches that keep coming back. You feel dizzy. You have swelling in your ankles. You have trouble with your vision. Get help right away if: You get a very bad headache. You start to feel mixed up (confused). You feel weak or numb. You feel faint. You have very bad pain in your: Chest. Belly (abdomen). You vomit more than once. You have trouble breathing. These symptoms may be an emergency. Get help right away. Call 911. Do not wait to see if the symptoms will go away. Do not drive yourself to the hospital. Summary Hypertension is another name for high blood pressure. High blood pressure forces your heart to work harder to pump blood. For most people, a normal blood pressure is less than 120/80. Making healthy choices can help lower blood pressure. If your blood  pressure does not get lower with healthy choices, you may need to take medicine. This information is not intended to replace advice given to you by your health care provider. Make sure you discuss any questions you have with your health care provider. Document Revised: 03/29/2021 Document Reviewed: 03/29/2021 Elsevier Patient Education  2023 [employer].

## 2022-11-14 ENCOUNTER — Ambulatory Visit: Payer: Medicare HMO | Admitting: Cardiology

## 2022-11-14 ENCOUNTER — Encounter: Payer: Self-pay | Admitting: Cardiology

## 2022-11-14 VITALS — BP 124/67 | HR 58 | Resp 16 | Ht 63.0 in | Wt 213.0 lb

## 2022-11-14 DIAGNOSIS — E119 Type 2 diabetes mellitus without complications: Secondary | ICD-10-CM | POA: Diagnosis not present

## 2022-11-14 DIAGNOSIS — I10 Essential (primary) hypertension: Secondary | ICD-10-CM

## 2022-11-14 DIAGNOSIS — E7800 Pure hypercholesterolemia, unspecified: Secondary | ICD-10-CM

## 2022-11-14 DIAGNOSIS — R0989 Other specified symptoms and signs involving the circulatory and respiratory systems: Secondary | ICD-10-CM | POA: Diagnosis not present

## 2022-11-14 NOTE — Progress Notes (Signed)
Primary Physician/Referring:  Dorothyann Peng, MD  Patient ID: [name], female    DOB: 09/22/1945, 77 y.o.   MRN: [id number]  Chief Complaint  Patient presents with   Palpitations   Follow-up     HPI: [name]  is a 77 y.o. female with  diabetes mellitus, hypertension, hyperlipidemia, morbid obesity, nocturnal hypoxemia presently on oxygen supplementation,GERD and hiatal hernia.  Patient made an appointment to see me as it has been >2 years since last office visit and she was concerned about cardiac health in general.  For very mild dyspnea on exertion which she states that is very stable, remains asymptomatic.  Past Medical History:  Diagnosis Date   Arthritis    hands   Bladder infection    Carpal tunnel syndrome    Complication of anesthesia    Difficult to arouse   Diabetes mellitus    GERD (gastroesophageal reflux disease)    Hypertension    Sleep apnea     Past Surgical History:  Procedure Laterality Date   ABDOMINAL HYSTERECTOMY  1985   partial   APPENDECTOMY     age 19   CARPAL TUNNEL RELEASE Bilateral 1980   cataract surgery Right 07/07/2020   Dr. Dione Booze   cataract surgery Left 12/2020   HERNIA REPAIR  1990   TONSILLECTOMY     as a child   TOTAL KNEE ARTHROPLASTY Right 04/19/2019   Procedure: RIGHT TOTAL KNEE ARTHROPLASTY;  Surgeon: Gean Birchwood, MD;  Location: [facility];  Service: Orthopedics;  Laterality: Right;   TOTAL KNEE ARTHROPLASTY Left 05/21/2021   Procedure: LEFT TOTAL KNEE ARTHROPLASTY;  Surgeon: Gean Birchwood, MD;  Location: [facility];  Service: Orthopedics;  Laterality: Left;    Social History   Tobacco Use   Smoking status: Never    Passive exposure: Never   Smokeless tobacco: Never  Substance Use Topics   Alcohol use: No   Marital Status: Widowed   Review of Systems  Cardiovascular:  Positive for dyspnea on exertion. Negative for chest pain and leg swelling.   Objective  Blood pressure 124/67, pulse (!) 58, resp. rate 16,  height 5\' 3"  (1.6 m), weight 213 lb (96.6 kg), SpO2 93 %. Body mass index is 37.73 kg/m.     11/14/2022    2:29 PM 11/13/2022    8:40 AM 10/21/2022   10:59 PM  Vitals with BMI  Height 5\' 3"  5\' 3"    Weight 213 lbs 211 lbs 13 oz   BMI 37.74 37.53   Systolic 124 120 409  Diastolic 67 68 72  Pulse 58 98 70    Physical Exam Constitutional:      Appearance: She is obese.  Neck:     Vascular: Carotid bruit (faint right carotid bruit) present. No JVD.  Cardiovascular:     Rate and Rhythm: Normal rate and regular rhythm.     Pulses: Normal pulses.     Heart sounds: Normal heart sounds. No murmur heard.    No gallop.  Pulmonary:     Effort: Pulmonary effort is normal.     Breath sounds: Normal breath sounds.  Abdominal:     General: Bowel sounds are normal.     Palpations: Abdomen is soft.  Musculoskeletal:     Right lower leg: No edema.     Left lower leg: No edema.    Radiology: No results found.  Laboratory examination:       Latest Ref Rng & Units 09/17/2022   12:26  PM 06/04/2022   12:02 PM 05/21/2022   12:51 PM  CMP  Glucose 70 - 99 mg/dL 98  161  096   BUN 8 - 27 mg/dL 10  11  12    Creatinine 0.57 - 1.00 mg/dL 0.45  4.09  8.11   Sodium 134 - 144 mmol/L 146  141  143   Potassium 3.5 - 5.2 mmol/L 4.6  4.1  4.4   Chloride 96 - 106 mmol/L 106  104  105   CO2 20 - 29 mmol/L 23  22  23    Calcium 8.7 - 10.3 mg/dL 9.3  9.4  9.4   Total Protein 6.0 - 8.5 g/dL 6.6   7.0   Total Bilirubin 0.0 - 1.2 mg/dL 0.5   0.4   Alkaline Phos 44 - 121 IU/L 95   94   AST 0 - 40 IU/L 16   14   ALT 0 - 32 IU/L 10   9       Latest Ref Rng & Units 09/17/2022   12:26 PM 09/11/2021   12:37 PM 07/05/2021   11:35 AM  CBC  WBC 3.4 - 10.8 x10E3/uL 5.0  5.4  5.2   Hemoglobin 11.1 - 15.9 g/dL 91.4  78.2  95.6   Hematocrit 34.0 - 46.6 % 37.5  39.9  37.2   Platelets 150 - 450 x10E3/uL 260  283  334    Lipid Panel Recent Labs    09/17/22 1226  CHOL 225*  TRIG 73  LDLCALC 131*  HDL 81   CHOLHDL 2.8    Lipid profile 09/11/2021:  Total cholesterol 169, triglycerides 56, HDL 84, LDL 74.  HEMOGLOBIN A1C Lab Results  Component Value Date   HGBA1C 6.5 (H) 09/17/2022   MPG 140 05/21/2021   Lab Results  Component Value Date   TSH 1.460 08/09/2021   Last vitamin D Lab Results  Component Value Date   VD25OH 41.9 03/06/2021    Cardiac Studies:   ABI 04/01/2013: This exam reveals normal perfusion of both the lower extremities with bilateral ABI of 1.04.  Abdominal Aortic Duplex  03/06/2020: Mild heterogeneous plaque noted in the abdominal aorta. The maximum aorta (sac) diameter is 2.26 cm (dist). with minimal ectasia.  No AAA observed.  Bilateral internal iliac artery velocity mildly elevated suggesting <50% stenosis.  Lexiscan myoview stress test 08/18/2017:  1. Pharmacologic stress testing was performed with intravenous administration of .4 mg of Lexiscan over a 10-15 seconds infusion. Patient was hypertensive through out the study, max BP 160/88 mmHg. Stress symptoms included dyspnea, nausea. Exercise capacity not assessed. Stress EKG is non diagnostic for ischemia as it is a pharmacologic stress.  2. The overall quality of the study is good.  Left ventricular cavity is noted to be normal on the rest and stress studies.  Review of the raw data in a rotational cine format reveals breast attenuation, with imaging performed in sitting position. Gated SPECT images reveal normal myocardial thickening and wall motion.  The left ventricular ejection fraction was calculated or visually estimated to be 62%.  REST and STRESS images demonstrate medium sized area of moderately decreased tracer uptake in the basal inferoseptal, mid inferoseptal and apical inferior segments of the left ventricle. Defect improves on stress images. Defects are likely related to breast attenuation. Superimposed ischemia in this region cannot be excluded.  3. This is a low risk study.  Echocardiogram  02/25/2020: Normal LV systolic function with visual EF 60-65%. Left ventricle cavity is normal  in size. Mild concentric hypertrophy of the left ventricle. Normal global wall motion. Normal diastolic filling pattern, normal LAP. Calculated EF 57%. Mild (Grade I) mitral regurgitation. Mild tricuspid regurgitation. No evidence of pulmonary hypertension. Mild pulmonic regurgitation. IVC is dilated with respiratory variation. No significant change compared to prior study dated 08/26/2017.   EKG:  EKG 11/14/2022: Normal sinus rhythm at rate of 63 bpm, leftward axis, incomplete right bundle branch block.  Diffuse nonspecific T abnormality.  Compared to 09/17/2022, no change.  Compared to 04/02/2021, no significant change.  EKG 04/02/2021: Marked sinus bradycardia, 48 bpm, normal axis, nonspecific T wave flattening.  No significant change from 02/23/2020.  Previously heart rate was 59 bpm.  ity.  No significant change from 06/09/2019.  Medications and allergies   Allergies  Allergen Reactions   Pravastatin Hives   Haemophilus Influenzae Vaccines     Nausea, pain, body weakness   Sulfa Antibiotics Itching   Sulfonamide Derivatives Itching   Tetanus Toxoid, Adsorbed Other (See Comments)   Tetanus Toxoids Swelling    Arm swelled at injection site    Current Outpatient Medications:    albuterol (VENTOLIN HFA) 108 (90 Base) MCG/ACT inhaler, Inhale 2 puffs into the lungs every 6 (six) hours as needed for wheezing or shortness of breath., Disp: 18 g, Rfl: 2   Ascorbic Acid (VITAMIN C) 1000 MG tablet, Take 1,000 mg by mouth daily., Disp: , Rfl:    aspirin EC 81 MG tablet, Take 1 tablet (81 mg total) by mouth 2 (two) times daily., Disp: 60 tablet, Rfl: 0   azelastine (ASTELIN) 0.1 % nasal spray, 2 sprays each nostril 2 times daily for drainage control., Disp: 30 mL, Rfl: 5   Bacillus Coagulans-Inulin (PROBIOTIC FORMULA PO), Take 1 capsule by mouth 2 (two) times a week., Disp: , Rfl:    carvedilol (COREG)  3.125 MG tablet, Take 1 tablet (3.125 mg total) by mouth 2 (two) times daily., Disp: 180 tablet, Rfl: 1   Cholecalciferol (VITAMIN D) 50 MCG (2000 UT) tablet, Take 2,000 Units by mouth daily., Disp: , Rfl:    clotrimazole-betamethasone (LOTRISONE) cream, Apply topically 2 (two) times daily as needed. (Patient taking differently: Apply 1 Application topically 2 (two) times daily as needed (irritation).), Disp: 30 g, Rfl: 1   Coenzyme Q10 (COQ10) 100 MG CAPS, Take 100 mg by mouth daily with lunch. , Disp: , Rfl:    doxycycline (VIBRAMYCIN) 100 MG capsule, Take 1 capsule (100 mg total) by mouth 2 (two) times daily., Disp: 20 capsule, Rfl: 0   EPINEPHrine 0.3 mg/0.3 mL IJ SOAJ injection, Inject 0.3 mg into the muscle as needed for anaphylaxis., Disp: 1 each, Rfl: 2   estradiol (ESTRACE) 0.1 MG/GM vaginal cream, Place 1 Applicatorful vaginally daily as needed (dryness)., Disp: , Rfl:    famotidine (PEPCID) 20 MG tablet, One after supper (Patient taking differently: Take 20 mg by mouth every evening. One after supper), Disp: 90 tablet, Rfl: 1   fluticasone (FLONASE) 50 MCG/ACT nasal spray, Place 1 spray into both nostrils daily as needed for allergies or rhinitis., Disp: , Rfl:    furosemide (LASIX) 20 MG tablet, Take 1 tablet (20 mg total) by mouth daily as needed (fluid)., Disp: 90 tablet, Rfl: 1   ipratropium (ATROVENT) 0.06 % nasal spray, Place 2 sprays into both nostrils 4 (four) times daily., Disp: , Rfl:    metFORMIN (GLUCOPHAGE) 500 MG tablet, Take 1 tablet (500 mg total) by mouth 2 (two) times daily with a meal., Disp:  180 tablet, Rfl: 2   nystatin (MYCOSTATIN/NYSTOP) powder, Apply 1 Application topically 2 (two) times daily. To affected area(s), Disp: 60 g, Rfl: 1   OneTouch Delica Lancets 33G MISC, USE   TO CHECK GLUCOSE BEFORE BREAKFAST AND  DINNER, Disp: 100 each, Rfl: 0   ONETOUCH VERIO test strip, USE 1 STRIP TO CHECK GLUCOSE TWICE DAILY, Disp: 100 each, Rfl: 0   OXYGEN, Inhale 1 L into the  lungs at bedtime as needed (oxygen)., Disp: , Rfl:    pantoprazole (PROTONIX) 40 MG tablet, Take 1 tablet by mouth once daily, Disp: 30 tablet, Rfl: 0   potassium chloride SA (KLOR-CON M) 20 MEQ tablet, TAKE 1 TABLET BY MOUTH ONCE DAILY AS NEEDED WITH EACH DOSE OF HYDROCHLOROTHIAZIDE (Patient taking differently: Take 20 mEq by mouth daily as needed (low potassium).), Disp: 90 tablet, Rfl: 0   rosuvastatin (CRESTOR) 20 MG tablet, Take 20 mg by mouth daily., Disp: , Rfl:    valsartan-hydrochlorothiazide (DIOVAN-HCT) 80-12.5 MG tablet, Take 1 tablet by mouth daily., Disp: , Rfl:     Assessment     ICD-10-CM   1. Primary hypertension  I10 EKG 12-Lead    2. Bruit of right carotid artery  R09.89 PCV CAROTID DUPLEX (BILATERAL)    3. Pure hypercholesterolemia  E78.00     4. Type 2 diabetes mellitus without complication, without long-term current use of insulin (HCC)  E11.9      There are no discontinued medications.   No orders of the defined types were placed in this encounter.  Recommendations:   [name]  is a 77 y.o.  female  with  diabetes mellitus, hypertension, hyperlipidemia, morbid obesity, nocturnal hypoxemia presently on oxygen supplementation,GERD and hiatal hernia.  Patient made an appointment to see me as it has been >2 years since last office visit and she was concerned about cardiac health in general.  1. Primary hypertension Blood pressure is very well-controlled she is presently on appropriately valsartan HCT.  Renal function is normal. - EKG 12-Lead  2. Bruit of right carotid artery She has a faint right carotid bruit, will obtain carotid artery duplex. - PCV CAROTID DUPLEX (BILATERAL); Future  3. Pure hypercholesterolemia I reviewed her lipids, her lipids traditionally have been very well-controlled.  There was a mixup at the pharmacy and she was not receiving Crestor but she is now back on it.  She has been scheduled for repeat lipid profile testing in 2 to 3  months by Dr. Dorothyann Peng.  4. Type 2 diabetes mellitus without complication, without long-term current use of insulin (HCC) Diabetes is well-controlled.  Weight loss discussed with the patient.  Although she has significant cardiovascular risk factors, no change in physical exam except for faint right carotid bruit, respecters have mostly well-controlled except for elevated LDL which is due to a mixup in the medications, patient has remained stable with very mild chronic dyspnea on exertion but no change in symptoms, EKG is also unchanged.  I do not think she needs stress testing or echocardiogram.  I will see her back in 2 years unless carotid artery duplex is abnormal.   Yates Decamp, MD, [facility] 11/14/2022, 4:30 PM Office: [phone]

## 2022-11-14 NOTE — Progress Notes (Signed)
EKG 11/14/2022: Normal sinus rhythm at rate of 63 bpm, leftward axis, incomplete right bundle branch block.  Diffuse nonspecific T abnormality.  Compared to 09/17/2022, no change.  Compared to 04/02/2021, no significant change.

## 2022-11-26 ENCOUNTER — Ambulatory Visit: Payer: Medicare HMO | Admitting: Family Medicine

## 2022-11-26 NOTE — Progress Notes (Deleted)
   [address] [place] [address] Dept: [phone]  FOLLOW UP NOTE  Patient ID: [name], female    DOB: Nov 16, 1945  Age: 77 y.o. MRN: [id number] Date of Office Visit: 11/26/2022  Assessment  Chief Complaint: No chief complaint on file.  HPI [name] is a 77 year old female who presents to the clinic for follow-up visit.  She was last seen in this clinic on 03/01/2021 by Dr. Delorse Lek for evaluation of chronic cough, allergic rhinitis and reflux.   Drug Allergies:  Allergies  Allergen Reactions   Pravastatin Hives   Haemophilus Influenzae Vaccines     Nausea, pain, body weakness   Sulfa Antibiotics Itching   Sulfonamide Derivatives Itching   Tetanus Toxoid, Adsorbed Other (See Comments)   Tetanus Toxoids Swelling    Arm swelled at injection site    Physical Exam: There were no vitals taken for this visit.   Physical Exam  Diagnostics:    Assessment and Plan: No diagnosis found.  No orders of the defined types were placed in this encounter.   There are no Patient Instructions on file for this visit.  No follow-ups on file.    Thank you for the opportunity to care for this patient.  Please do not hesitate to contact me with questions.  Thermon Leyland, FNP [facility]

## 2022-11-29 ENCOUNTER — Other Ambulatory Visit: Payer: Self-pay | Admitting: Internal Medicine

## 2022-12-03 DIAGNOSIS — G4733 Obstructive sleep apnea (adult) (pediatric): Secondary | ICD-10-CM | POA: Diagnosis not present

## 2022-12-03 DIAGNOSIS — I10 Essential (primary) hypertension: Secondary | ICD-10-CM | POA: Diagnosis not present

## 2022-12-03 DIAGNOSIS — R0602 Shortness of breath: Secondary | ICD-10-CM | POA: Diagnosis not present

## 2022-12-03 DIAGNOSIS — R0600 Dyspnea, unspecified: Secondary | ICD-10-CM | POA: Diagnosis not present

## 2022-12-04 DIAGNOSIS — R311 Benign essential microscopic hematuria: Secondary | ICD-10-CM | POA: Diagnosis not present

## 2022-12-04 DIAGNOSIS — N3020 Other chronic cystitis without hematuria: Secondary | ICD-10-CM | POA: Diagnosis not present

## 2022-12-04 DIAGNOSIS — N139 Obstructive and reflux uropathy, unspecified: Secondary | ICD-10-CM | POA: Diagnosis not present

## 2022-12-04 DIAGNOSIS — R300 Dysuria: Secondary | ICD-10-CM | POA: Diagnosis not present

## 2022-12-06 ENCOUNTER — Ambulatory Visit: Payer: Medicare HMO

## 2022-12-06 DIAGNOSIS — R0989 Other specified symptoms and signs involving the circulatory and respiratory systems: Secondary | ICD-10-CM

## 2022-12-10 ENCOUNTER — Encounter: Payer: Self-pay | Admitting: Primary Care

## 2022-12-10 ENCOUNTER — Ambulatory Visit: Payer: Medicare HMO | Admitting: Primary Care

## 2022-12-10 VITALS — BP 112/70 | HR 64 | Temp 97.4°F | Ht 63.0 in | Wt 217.0 lb

## 2022-12-10 DIAGNOSIS — J3089 Other allergic rhinitis: Secondary | ICD-10-CM

## 2022-12-10 DIAGNOSIS — R053 Chronic cough: Secondary | ICD-10-CM

## 2022-12-10 MED ORDER — ALBUTEROL SULFATE HFA 108 (90 BASE) MCG/ACT IN AERS
2.0000 | INHALATION_SPRAY | Freq: Four times a day (QID) | RESPIRATORY_TRACT | 2 refills | Status: DC | PRN
Start: 2022-12-10 — End: 2023-06-10

## 2022-12-10 MED ORDER — PANTOPRAZOLE SODIUM 40 MG PO TBEC: 40.0000 mg | DELAYED_RELEASE_TABLET | Freq: Every day | ORAL | 5 refills | Status: DC

## 2022-12-10 MED ORDER — IPRATROPIUM BROMIDE 0.06 % NA SOLN: 2.0000 | Freq: Four times a day (QID) | NASAL | 2 refills | Status: DC

## 2022-12-10 MED ORDER — FAMOTIDINE 20 MG PO TABS: ORAL_TABLET | ORAL | 3 refills | Status: DC

## 2022-12-10 NOTE — Assessment & Plan Note (Addendum)
-   Continue over-the-counter antihistamine and Atrovent nasal spray - Strongly positive allergy testing in June 2023/ FENO normal 06/05/22 - Recheck cbc with diff and IGE  - May want to discuss allergy shots/biologics at follow-up

## 2022-12-10 NOTE — Patient Instructions (Addendum)
Recommendations: Continue over the counter antihistamine Continue reflux medication as directed  Continue Astelin nasal spray and fluticasone nasal spray Continue Albuterol 2 puffs every 4-6 hours as needed for shortness of breath  Take mucinex-dm twice daily to loosen congestion with glass of water   Referred: Dr. Irene Pap ENT with [facility] re: chronic cough   Orders: CT chest and sinuses   Follow-up: 1-2 months with Dr. Sherene Sires

## 2022-12-10 NOTE — Assessment & Plan Note (Signed)
Chronic cough for >2 years  No better on Allegra/Singulair or Atrovent CXR in December was clear Needs CT imaging of chest and sinuses Referring to ENT

## 2022-12-10 NOTE — Addendum Note (Signed)
Addended by: Lanna Poche on: 12/10/2022 02:42 PM   Modules accepted: Orders

## 2022-12-10 NOTE — Progress Notes (Signed)
@Patient  ID: [name], female    DOB: 1946/06/20, 77 y.o.   MRN: [id number]  Chief Complaint  Patient presents with   Acute Visit    cough    Referring provider: Dorothyann Peng, MD  HPI: 77 year old female, never smoked.  Past medical history significant for cough variant asthma, chronic rhinitis.  Patient of Dr. Sherene Sires, last seen in office on 08/14/2022.  Onset worsening of chronic rhinitis spring 2020 follow by onset of chronic cough  - [facility] w/u  11/29/21 :   strongly Pos IgE 559 multiple pos RAST  dust mite, grass pollen, cockroach, molds, tree pollen and weed pollen    but skin testing neg > no better on allegra/ singulair, atrovent nasa spray - 06/05/2022   Walked on RA  x  3  lap(s) =  approx 750  ft  @ fast pace, stopped due to end of study  with lowest 02 sats 98% s sob /cough /cp   - FENO 06/05/2022  = 11      12/10/2022- interim hx  Presents today for acute office visit. Patient of Dr. Sherene Sires, last seen in February 2024 for chronic rhinitis and chronic cough. Not felt to be true asthma.  Allergy testing positive in June 2023.  Recommended Max GERD treatment + h1 blocker pepcid 20mg  at bedtime / mucinex dm - refer to [facility] if not improved with above.  She had a flare up three weeks ago where she reports having increased sob and productive cough. Associated sneezing and running nose. Symptoms have eased up. Cough is her biggest complaint. Cough is productive with clear mucus. She is taking OTC antihistamine and Atrovent nasal spray as directed. Reports no significant improvement from Singulair or when taking prednisone. She uses albuterol three times a day. Needs refill of Albuterol, nasal spray and protonix.   Allergies  Allergen Reactions   Pravastatin Hives   Haemophilus Influenzae Vaccines     Nausea, pain, body weakness   Sulfa Antibiotics Itching   Sulfonamide Derivatives Itching   Tetanus Toxoid, Adsorbed Other (See Comments)   Tetanus Toxoids  Swelling    Arm swelled at injection site    Immunization History  Administered Date(s) Administered   PFIZER(Purple Top)SARS-COV-2 Vaccination 09/02/2019, 09/27/2019, 06/13/2020    Past Medical History:  Diagnosis Date   Arthritis    hands   Bladder infection    Carpal tunnel syndrome    Complication of anesthesia    Difficult to arouse   Diabetes mellitus    GERD (gastroesophageal reflux disease)    Hypertension    Sleep apnea     Tobacco History: Social History   Tobacco Use  Smoking Status Never   Passive exposure: Never  Smokeless Tobacco Never   Counseling given: Not Answered   Outpatient Medications Prior to Visit  Medication Sig Dispense Refill   Ascorbic Acid (VITAMIN C) 1000 MG tablet Take 1,000 mg by mouth daily.     aspirin EC 81 MG tablet Take 1 tablet (81 mg total) by mouth 2 (two) times daily. 60 tablet 0   Bacillus Coagulans-Inulin (PROBIOTIC FORMULA PO) Take 1 capsule by mouth 2 (two) times a week.     carvedilol (COREG) 3.125 MG tablet Take 1 tablet (3.125 mg total) by mouth 2 (two) times daily. 180 tablet 1   Cholecalciferol (VITAMIN D) 50 MCG (2000 UT) tablet Take 2,000 Units by mouth daily.     clotrimazole-betamethasone (LOTRISONE) cream Apply topically 2 (two) times daily  as needed. (Patient taking differently: Apply 1 Application topically 2 (two) times daily as needed (irritation).) 30 g 1   Coenzyme Q10 (COQ10) 100 MG CAPS Take 100 mg by mouth daily with lunch.      EPINEPHrine 0.3 mg/0.3 mL IJ SOAJ injection Inject 0.3 mg into the muscle as needed for anaphylaxis. 1 each 2   estradiol (ESTRACE) 0.1 MG/GM vaginal cream Place 1 Applicatorful vaginally daily as needed (dryness).     fluticasone (FLONASE) 50 MCG/ACT nasal spray Place 1 spray into both nostrils daily as needed for allergies or rhinitis.     furosemide (LASIX) 20 MG tablet Take 1 tablet (20 mg total) by mouth daily as needed (fluid). 90 tablet 1   metFORMIN (GLUCOPHAGE) 500 MG  tablet TAKE 1 TABLET BY MOUTH TWICE DAILY WITH A MEAL 180 tablet 0   nystatin (MYCOSTATIN/NYSTOP) powder Apply 1 Application topically 2 (two) times daily. To affected area(s) 60 g 1   OneTouch Delica Lancets 33G MISC USE   TO CHECK GLUCOSE BEFORE BREAKFAST AND  DINNER 100 each 0   ONETOUCH VERIO test strip USE 1 STRIP TO CHECK GLUCOSE TWICE DAILY 100 each 0   OXYGEN Inhale 1 L into the lungs at bedtime as needed (oxygen).     potassium chloride SA (KLOR-CON M) 20 MEQ tablet TAKE 1 TABLET BY MOUTH ONCE DAILY AS NEEDED WITH EACH DOSE OF HYDROCHLOROTHIAZIDE (Patient taking differently: Take 20 mEq by mouth daily as needed (low potassium).) 90 tablet 0   rosuvastatin (CRESTOR) 20 MG tablet Take 20 mg by mouth daily.     valsartan-hydrochlorothiazide (DIOVAN-HCT) 80-12.5 MG tablet Take 1 tablet by mouth daily.     albuterol (VENTOLIN HFA) 108 (90 Base) MCG/ACT inhaler Inhale 2 puffs into the lungs every 6 (six) hours as needed for wheezing or shortness of breath. 18 g 2   azelastine (ASTELIN) 0.1 % nasal spray 2 sprays each nostril 2 times daily for drainage control. 30 mL 5   doxycycline (VIBRAMYCIN) 100 MG capsule Take 1 capsule (100 mg total) by mouth 2 (two) times daily. 20 capsule 0   famotidine (PEPCID) 20 MG tablet One after supper (Patient taking differently: Take 20 mg by mouth every evening. One after supper) 90 tablet 1   ipratropium (ATROVENT) 0.06 % nasal spray Place 2 sprays into both nostrils 4 (four) times daily.     pantoprazole (PROTONIX) 40 MG tablet Take 1 tablet by mouth once daily 30 tablet 0   No facility-administered medications prior to visit.   Review of Systems  Review of Systems  Constitutional: Negative.   HENT:  Positive for congestion and postnasal drip.   Respiratory:  Positive for cough.   Cardiovascular: Negative.    Physical Exam  BP 112/70 (BP Location: Left Arm, Patient Position: Sitting, Cuff Size: Large)   Pulse 64   Temp (!) 97.4 F (36.3 C)  (Temporal)   Ht 5\' 3"  (1.6 m)   Wt 217 lb (98.4 kg)   SpO2 94%   BMI 38.44 kg/m  Physical Exam Constitutional:      Appearance: Normal appearance.  HENT:     Head: Normocephalic and atraumatic.  Cardiovascular:     Rate and Rhythm: Normal rate and regular rhythm.  Pulmonary:     Effort: Pulmonary effort is normal.     Breath sounds: Normal breath sounds.  Skin:    General: Skin is warm and dry.  Neurological:     General: No focal deficit present.  Mental Status: She is alert and oriented to person, place, and time. Mental status is at baseline.  Psychiatric:        Mood and Affect: Mood normal.        Behavior: Behavior normal.        Thought Content: Thought content normal.        Judgment: Judgment normal.      Lab Results:  CBC    Component Value Date/Time   WBC 5.0 09/17/2022 1226   WBC 10.4 05/24/2021 0304   RBC 4.23 09/17/2022 1226   RBC 3.40 (L) 05/24/2021 0304   HGB 12.1 09/17/2022 1226   HCT 37.5 09/17/2022 1226   PLT 260 09/17/2022 1226   MCV 89 09/17/2022 1226   MCH 28.6 09/17/2022 1226   MCH 28.5 05/24/2021 0304   MCHC 32.3 09/17/2022 1226   MCHC 31.8 05/24/2021 0304   RDW 13.8 09/17/2022 1226   LYMPHSABS 1.5 05/09/2021 1146   MONOABS 0.5 05/09/2021 1146   EOSABS 0.1 05/09/2021 1146   BASOSABS 0.0 05/09/2021 1146    BMET    Component Value Date/Time   NA 146 (H) 09/17/2022 1226   K 4.6 09/17/2022 1226   CL 106 09/17/2022 1226   CO2 23 09/17/2022 1226   GLUCOSE 98 09/17/2022 1226   GLUCOSE 137 (H) 05/22/2021 0328   BUN 10 09/17/2022 1226   CREATININE 0.70 09/17/2022 1226   CALCIUM 9.3 09/17/2022 1226   GFRNONAA >60 05/22/2021 0328   GFRAA 95 06/26/2020 1144    BNP    Component Value Date/Time   BNP 41.5 08/01/2017 1825    ProBNP No results found for: "PROBNP"  Imaging: No results found.   Assessment & Plan:   Cough Chronic cough for >2 years  No better on Allegra/Singulair or Atrovent CXR in December was  clear Needs CT imaging of chest and sinuses Referring to ENT   ALLERGIC RHINITIS CAUSE UNSPECIFIED - Continue over-the-counter antihistamine and Atrovent nasal spray - Strongly positive allergy testing in June 2023/ FENO normal 06/05/22 - May want to discuss allergy shots/biologics at follow-up      Glenford Bayley, NP 12/10/2022

## 2022-12-13 ENCOUNTER — Telehealth: Payer: Self-pay

## 2022-12-13 NOTE — Telephone Encounter (Signed)
Patient calling for carotid results. °

## 2022-12-13 NOTE — Telephone Encounter (Signed)
Not read, will do over weekend and will mychart message her

## 2022-12-15 NOTE — Progress Notes (Signed)
Carotid artery duplex 12/06/2022: The bifurcation, internal, external and common carotid arteries reveal no evidence of significant stenosis, bilaterally. Antegrade right vertebral artery flow. Antegrade left vertebral artery flow.

## 2022-12-24 ENCOUNTER — Telehealth: Payer: Self-pay | Admitting: Primary Care

## 2022-12-24 DIAGNOSIS — R053 Chronic cough: Secondary | ICD-10-CM

## 2022-12-24 DIAGNOSIS — J3089 Other allergic rhinitis: Secondary | ICD-10-CM

## 2022-12-24 NOTE — Telephone Encounter (Signed)
Needs updated CPT code CT Maxillofacial LTD WO CM

## 2022-12-24 NOTE — Progress Notes (Unsigned)
ENT CONSULT:  Reason for Consult: chronic cough    Referring Physician:  Ames Dura, NP [facility]  HPI: [name] is an 77 y.o. female with hx chronic cough is here for initial evaluation with [place].   Records Reviewed:  Pending CT chest w/o contrast and CT Max/Face and scheduled for 12/30/2022  Note by Ames Dura, NP 12/10/2022 "77 year old female, never smoked.  Past medical history significant for cough variant asthma, chronic rhinitis.  Patient of Dr. Sherene Sires, last seen in office on 08/14/2022.   Onset worsening of chronic rhinitis spring 2020 follow by onset of chronic cough  - [facility] w/u  11/29/21 :   strongly Pos IgE 559 multiple pos RAST  dust mite, grass pollen, cockroach, molds, tree pollen and weed pollen    but skin testing neg > no better on allegra/ singulair, atrovent nasa spray - 06/05/2022   Walked on RA  x  3  lap(s) =  approx 750  ft  @ fast pace, stopped due to end of study  with lowest 02 sats 98% s sob /cough /cp   - FENO 06/05/2022  = 11      On Atrovent/Albuterol and PPI (Protonix 40 mg daily) On Azelastine  Chronic cough for >2 years  No better on Allegra/Singulair or Atrovent CXR in December was clear Needs CT imaging of chest and sinuses Referring to ENT   ALLERGIC RHINITIS CAUSE UNSPECIFIED - Continue over-the-counter antihistamine and Atrovent nasal spray - Strongly positive allergy testing in June 2023/ FENO normal 06/05/22 - May want to discuss allergy shots/biologics at follow-up      12/10/2022- interim hx  Presents today for acute office visit. Patient of Dr. Sherene Sires, last seen in February 2024 for chronic rhinitis and chronic cough. Not felt to be true asthma.  Allergy testing positive in June 2023.  Recommended Max GERD treatment + h1 blocker pepcid 20mg  at bedtime / mucinex dm - refer to [facility] voice center if not improved with above.   She had a flare up three weeks ago where she reports having increased sob and productive cough.  Associated sneezing and running nose. Symptoms have eased up. Cough is her biggest complaint. Cough is productive with clear mucus. She is taking OTC antihistamine and Atrovent nasal spray as directed. Reports no significant improvement from Singulair or when taking prednisone. She uses albuterol three times a day. Needs refill of Albuterol, nasal spray and protonix.       Past Medical History:  Diagnosis Date   Arthritis    hands   Bladder infection    Carpal tunnel syndrome    Complication of anesthesia    Difficult to arouse   Diabetes mellitus    GERD (gastroesophageal reflux disease)    Hypertension    Sleep apnea     Past Surgical History:  Procedure Laterality Date   ABDOMINAL HYSTERECTOMY  1985   partial   APPENDECTOMY     age 81   CARPAL TUNNEL RELEASE Bilateral 1980   cataract surgery Right 07/07/2020   Dr. Dione Booze   cataract surgery Left 12/2020   HERNIA REPAIR  1990   TONSILLECTOMY     as a child   TOTAL KNEE ARTHROPLASTY Right 04/19/2019   Procedure: RIGHT TOTAL KNEE ARTHROPLASTY;  Surgeon: Gean Birchwood, MD;  Location: [facility];  Service: Orthopedics;  Laterality: Right;   TOTAL KNEE ARTHROPLASTY Left 05/21/2021   Procedure: LEFT TOTAL KNEE ARTHROPLASTY;  Surgeon: Gean Birchwood, MD;  Location: [facility];  Service: Orthopedics;  Laterality: Left;    Family History  Problem Relation Age of Onset   Diabetes Mother    Hypertension Mother    Asthma Father    Heart attack Father 9   Stroke Father 58   Diabetes Father    Breast cancer Sister 21   Allergic rhinitis Brother    Diabetes Brother    Diabetes Brother    Atopy Paternal Grandmother     Social History:  reports that she has never smoked. She has never been exposed to tobacco smoke. She has never used smokeless tobacco. She reports that she does not drink alcohol and does not use drugs.  Allergies:  Allergies  Allergen Reactions   Pravastatin Hives   Haemophilus Influenzae Vaccines     Nausea, pain,  body weakness   Sulfa Antibiotics Itching   Sulfonamide Derivatives Itching   Tetanus Toxoid, Adsorbed Other (See Comments)   Tetanus Toxoids Swelling    Arm swelled at injection site    Medications: I have reviewed the patient's current medications.  No results found for this or any previous visit (from the past 48 hour(s)).  No results found.  The PMH, PSH, Medications, Allergies, and SH were reviewed and updated.  ROS: Constitutional: Negative for fever, weight loss and weight gain. Cardiovascular: Negative for chest pain and dyspnea on exertion. Respiratory: Is not experiencing shortness of breath at rest. Gastrointestinal: Negative for nausea and vomiting. Neurological: Negative for headaches. Psychiatric: The patient is not nervous/anxiou  There were no vitals taken for this visit.  PHYSICAL EXAM:  Exam: General: Well-developed, well-nourished Communication and Voice: Clear pitch and clarity Respiratory Respiratory effort: Equal inspiration and expiration without stridor Cardiovascular Peripheral Vascular: Warm extremities with equal color/perfusion Eyes: No nystagmus with equal extraocular motion bilaterally Neuro/Psych/Balance: Patient oriented to person, place, and time; Appropriate mood and affect; Gait is intact with no imbalance; Cranial nerves I-XII are intact Head and Face Inspection: Normocephalic and atraumatic without mass or lesion Palpation: Facial skeleton intact without bony stepoffs Salivary Glands: No mass or tenderness Facial Strength: Facial motility symmetric and full bilaterally ENT Pinna: External ear intact and fully developed External canal: Canal is patent with intact skin Tympanic Membrane: Clear and mobile External Nose: No scar or anatomic deformity Internal Nose: Septum intact and midline. No edema, polyp, or rhinorrhea Lips, Teeth, and gums: Mucosa and teeth intact and viable TMJ: No pain to palpation with full mobility Oral  cavity/oropharynx: No erythema or exudate, no lesions present Nasopharynx: No mass or lesion with intact mucosa Hypopharynx: Intact mucosa without pooling of secretions Larynx Glottic: Full true vocal cord mobility without lesion or mass Supraglottic: Normal appearing epiglottis and AE folds Interarytenoid Space: No or minimal pachydermia or edema Subglottic Space: Patent without lesion or edema Neck Neck and Trachea: Midline trachea without mass or lesion Thyroid: No mass or nodularity Lymphatics: No lymphadenopathy  Procedure:      Studies Reviewed:***  Assessment/Plan: ***  Thank you for allowing me to participate in the care of this patient. Please do not hesitate to contact me with any questions or concerns.   Ashok Croon, MD Otolaryngology [facility] Phone: [phone] Fax: [phone]    12/24/2022, 3:51 PM

## 2022-12-25 ENCOUNTER — Ambulatory Visit (INDEPENDENT_AMBULATORY_CARE_PROVIDER_SITE_OTHER): Payer: Medicare HMO | Admitting: Otolaryngology

## 2022-12-25 ENCOUNTER — Encounter (INDEPENDENT_AMBULATORY_CARE_PROVIDER_SITE_OTHER): Payer: Self-pay | Admitting: Otolaryngology

## 2022-12-25 VITALS — BP 117/74 | HR 63 | Ht 63.0 in | Wt 213.0 lb

## 2022-12-25 DIAGNOSIS — H6991 Unspecified Eustachian tube disorder, right ear: Secondary | ICD-10-CM

## 2022-12-25 DIAGNOSIS — K449 Diaphragmatic hernia without obstruction or gangrene: Secondary | ICD-10-CM | POA: Diagnosis not present

## 2022-12-25 DIAGNOSIS — K219 Gastro-esophageal reflux disease without esophagitis: Secondary | ICD-10-CM | POA: Diagnosis not present

## 2022-12-25 DIAGNOSIS — J343 Hypertrophy of nasal turbinates: Secondary | ICD-10-CM | POA: Diagnosis not present

## 2022-12-25 DIAGNOSIS — H9191 Unspecified hearing loss, right ear: Secondary | ICD-10-CM

## 2022-12-25 DIAGNOSIS — J3089 Other allergic rhinitis: Secondary | ICD-10-CM

## 2022-12-25 DIAGNOSIS — R0982 Postnasal drip: Secondary | ICD-10-CM | POA: Diagnosis not present

## 2022-12-25 DIAGNOSIS — G4733 Obstructive sleep apnea (adult) (pediatric): Secondary | ICD-10-CM

## 2022-12-25 DIAGNOSIS — R053 Chronic cough: Secondary | ICD-10-CM | POA: Diagnosis not present

## 2022-12-25 DIAGNOSIS — J3489 Other specified disorders of nose and nasal sinuses: Secondary | ICD-10-CM

## 2022-12-25 DIAGNOSIS — R0981 Nasal congestion: Secondary | ICD-10-CM | POA: Diagnosis not present

## 2022-12-25 MED ORDER — AZELASTINE HCL 0.15 % NA SOLN: 2.0000 | Freq: Every day | NASAL | 3 refills | Status: AC

## 2022-12-25 MED ORDER — MONTELUKAST SODIUM 10 MG PO TABS
10.0000 mg | ORAL_TABLET | Freq: Every day | ORAL | 3 refills | Status: DC
Start: 2022-12-25 — End: 2024-02-05

## 2022-12-25 NOTE — Addendum Note (Signed)
Addended byCurlene Dolphin, Hagen Bohorquez on: 12/25/2022 01:51 PM   Modules accepted: Orders

## 2022-12-25 NOTE — Patient Instructions (Addendum)
-   start Azelastine and continue Flonase  - continue Allegra or change to a different allergy pill - start Singulair  - start alginates ([employer] supplement) and continue two other reflux medications for now - return in 3 months  - Audiogram for hearing screening

## 2022-12-30 ENCOUNTER — Ambulatory Visit (HOSPITAL_COMMUNITY)
Admission: RE | Admit: 2022-12-30 | Discharge: 2022-12-30 | Disposition: A | Discharge status: Home / Self Care | Payer: Medicare HMO | Source: Ambulatory Visit | Attending: Primary Care | Admitting: Primary Care

## 2022-12-30 DIAGNOSIS — J329 Chronic sinusitis, unspecified: Secondary | ICD-10-CM | POA: Diagnosis not present

## 2022-12-30 DIAGNOSIS — R053 Chronic cough: Secondary | ICD-10-CM | POA: Insufficient documentation

## 2022-12-30 DIAGNOSIS — E041 Nontoxic single thyroid nodule: Secondary | ICD-10-CM | POA: Diagnosis not present

## 2022-12-30 DIAGNOSIS — R918 Other nonspecific abnormal finding of lung field: Secondary | ICD-10-CM | POA: Diagnosis not present

## 2022-12-30 DIAGNOSIS — J3089 Other allergic rhinitis: Secondary | ICD-10-CM | POA: Insufficient documentation

## 2023-01-01 ENCOUNTER — Ambulatory Visit: Payer: Medicare HMO | Admitting: Primary Care

## 2023-01-02 DIAGNOSIS — Z1231 Encounter for screening mammogram for malignant neoplasm of breast: Secondary | ICD-10-CM | POA: Diagnosis not present

## 2023-01-02 DIAGNOSIS — R0602 Shortness of breath: Secondary | ICD-10-CM | POA: Diagnosis not present

## 2023-01-02 DIAGNOSIS — R0600 Dyspnea, unspecified: Secondary | ICD-10-CM | POA: Diagnosis not present

## 2023-01-02 DIAGNOSIS — I10 Essential (primary) hypertension: Secondary | ICD-10-CM | POA: Diagnosis not present

## 2023-01-02 DIAGNOSIS — G4733 Obstructive sleep apnea (adult) (pediatric): Secondary | ICD-10-CM | POA: Diagnosis not present

## 2023-01-02 LAB — HM MAMMOGRAPHY: HM Mammogram: NORMAL (ref 0–4)

## 2023-01-03 DIAGNOSIS — K5730 Diverticulosis of large intestine without perforation or abscess without bleeding: Secondary | ICD-10-CM | POA: Diagnosis not present

## 2023-01-03 DIAGNOSIS — N139 Obstructive and reflux uropathy, unspecified: Secondary | ICD-10-CM | POA: Diagnosis not present

## 2023-01-03 DIAGNOSIS — N3020 Other chronic cystitis without hematuria: Secondary | ICD-10-CM | POA: Diagnosis not present

## 2023-01-06 NOTE — Progress Notes (Signed)
Please let patient know CT chest showed no acute abnormality. Mild scarring at lung base. Aortic atherosclerosis/ CAD, continue crestor. Incidental finding non obstructing small kidney stone. Follow-up with PCP if having symptoms    CT sinuses showed small polyp or mucus cyst left maxillary sinus, otherwise normal. Patent sinus drainage passageway   I will post to her mychart as well

## 2023-01-07 ENCOUNTER — Ambulatory Visit: Payer: Medicare HMO | Attending: Physician Assistant | Admitting: Audiologist

## 2023-01-07 DIAGNOSIS — H9193 Unspecified hearing loss, bilateral: Secondary | ICD-10-CM | POA: Insufficient documentation

## 2023-01-07 DIAGNOSIS — H938X1 Other specified disorders of right ear: Secondary | ICD-10-CM | POA: Diagnosis not present

## 2023-01-07 NOTE — Procedures (Signed)
  [facility] [address] [place], [place]  [address] [phone]  AUDIOLOGICAL  EVALUATION  NAME: [name]     DOB:   1945/09/07      MRN: [id number]                                                                                     DATE: 01/07/2023     REFERENT: Dorothyann Peng, MD STATUS: Outpatient DIAGNOSIS:   Right Ear Pressure, Decreased Hearing   History: Patches was seen for an audiological evaluation. [name] is receiving a hearing evaluation due to concerns for pressure and congestion in the right ear. She also has occasional pain in right ear. [name] has difficulty hearing when the right ear is stopped up. This difficulty began gradually. No pain or pressure reported in left ear. Tinnitus intermittent in both ears. She was recently evaluated by Dr. Irene Pap, Otolaryngology. Noted to likely have ETD on right. No history of hazardous noise exposure.  Medical history positive for diabetes which is a risk factor for hearing loss. No other relevant case history reported.   Evaluation:  Otoscopy showed a clear view of the tympanic membranes, bilaterally Tympanometry results were consistent with normal middle ear function, bilaterally   Audiometric testing was completed using conventional audiometry with supraural transducer. Speech Recognition Thresholds were 15dB in the right ear and 10dB in the left ear. Word Recognition was performed 40dB SL, scored 100% in the right ear and 100% in the left ear. Pure tone thresholds show normal hearing 250-6kHz sloping to a mild hearing loss 8kHz only bilaterally.   Results:  The test results were reviewed with [name]. She has essentially normal hearing and normal middle ear function bilaterally. She does not need hearing aids. Recommend hearing be monitored as needed. No audiology follow up needed. Results from testing today will be sent to Otolaryngology.    Recommendations: 1.   No further  audiologic testing is needed unless future hearing concerns arise. Hearing and middle ear function essentially normal bilaterally.  23 minutes spent testing and counseling on results.   [name], MS Audiology Student    [name]  Audiologist, Au.D., CCC-A 01/07/2023  10:52 AM  Cc: Dorothyann Peng, MD

## 2023-01-22 ENCOUNTER — Ambulatory Visit: Payer: Medicare HMO | Admitting: Internal Medicine

## 2023-01-22 ENCOUNTER — Encounter: Payer: Self-pay | Admitting: Internal Medicine

## 2023-01-22 VITALS — BP 118/74 | HR 56 | Temp 98.6°F | Ht 63.0 in | Wt 210.2 lb

## 2023-01-22 DIAGNOSIS — I700 Atherosclerosis of aorta: Secondary | ICD-10-CM | POA: Diagnosis not present

## 2023-01-22 DIAGNOSIS — Z6837 Body mass index (BMI) 37.0-37.9, adult: Secondary | ICD-10-CM | POA: Diagnosis not present

## 2023-01-22 DIAGNOSIS — I10 Essential (primary) hypertension: Secondary | ICD-10-CM

## 2023-01-22 DIAGNOSIS — L304 Erythema intertrigo: Secondary | ICD-10-CM | POA: Diagnosis not present

## 2023-01-22 DIAGNOSIS — E785 Hyperlipidemia, unspecified: Secondary | ICD-10-CM

## 2023-01-22 DIAGNOSIS — E1169 Type 2 diabetes mellitus with other specified complication: Secondary | ICD-10-CM

## 2023-01-22 MED ORDER — FUROSEMIDE 20 MG PO TABS: 20.0000 mg | ORAL_TABLET | Freq: Every day | ORAL | 1 refills | Status: DC | PRN

## 2023-01-22 MED ORDER — CARVEDILOL 3.125 MG PO TABS: 3.1250 mg | ORAL_TABLET | Freq: Two times a day (BID) | ORAL | 1 refills | Status: DC

## 2023-01-22 MED ORDER — EZETIMIBE 10 MG PO TABS: 10.0000 mg | ORAL_TABLET | Freq: Every day | ORAL | 11 refills | Status: DC

## 2023-01-22 MED ORDER — POTASSIUM CHLORIDE CRYS ER 20 MEQ PO TBCR: EXTENDED_RELEASE_TABLET | ORAL | 1 refills | Status: AC

## 2023-01-22 MED ORDER — NYSTATIN 100000 UNIT/GM EX POWD: 1.0000 | Freq: Two times a day (BID) | CUTANEOUS | 1 refills | Status: AC

## 2023-01-22 NOTE — Patient Instructions (Signed)
Hypertension, Adult Hypertension is another name for high blood pressure. High blood pressure forces your heart to work harder to pump blood. This can cause problems over time. There are two numbers in a blood pressure reading. There is a top number (systolic) over a bottom number (diastolic). It is best to have a blood pressure that is below 120/80. What are the causes? The cause of this condition is not known. Some other conditions can lead to high blood pressure. What increases the risk? Some lifestyle factors can make you more likely to develop high blood pressure: Smoking. Not getting enough exercise or physical activity. Being overweight. Having too much fat, sugar, calories, or salt (sodium) in your diet. Drinking too much alcohol. Other risk factors include: Having any of these conditions: Heart disease. Diabetes. High cholesterol. Kidney disease. Obstructive sleep apnea. Having a family history of high blood pressure and high cholesterol. Age. The risk increases with age. Stress. What are the signs or symptoms? High blood pressure may not cause symptoms. Very high blood pressure (hypertensive crisis) may cause: Headache. Fast or uneven heartbeats (palpitations). Shortness of breath. Nosebleed. Vomiting or feeling like you may vomit (nauseous). Changes in how you see. Very bad chest pain. Feeling dizzy. Seizures. How is this treated? This condition is treated by making healthy lifestyle changes, such as: Eating healthy foods. Exercising more. Drinking less alcohol. Your doctor may prescribe medicine if lifestyle changes do not help enough and if: Your top number is above 130. Your bottom number is above 80. Your personal target blood pressure may vary. Follow these instructions at home: Eating and drinking  If told, follow the DASH eating plan. To follow this plan: Fill one half of your plate at each meal with fruits and vegetables. Fill one fourth of your plate  at each meal with whole grains. Whole grains include whole-wheat pasta, brown rice, and whole-grain bread. Eat or drink low-fat dairy products, such as skim milk or low-fat yogurt. Fill one fourth of your plate at each meal with low-fat (lean) proteins. Low-fat proteins include fish, chicken without skin, eggs, beans, and tofu. Avoid fatty meat, cured and processed meat, or chicken with skin. Avoid pre-made or processed food. Limit the amount of salt in your diet to less than 1,500 mg each day. Do not drink alcohol if: Your doctor tells you not to drink. You are pregnant, may be pregnant, or are planning to become pregnant. If you drink alcohol: Limit how much you have to: 0-1 drink a day for women. 0-2 drinks a day for men. Know how much alcohol is in your drink. In the [place] one drink equals one 12 oz bottle of beer (355 mL), one 5 oz glass of wine (148 mL), or one 1 oz glass of hard liquor (44 mL). Lifestyle  Work with your doctor to stay at a healthy weight or to lose weight. Ask your doctor what the best weight is for you. Get at least 30 minutes of exercise that causes your heart to beat faster (aerobic exercise) most days of the week. This may include walking, swimming, or biking. Get at least 30 minutes of exercise that strengthens your muscles (resistance exercise) at least 3 days a week. This may include lifting weights or doing Pilates. Do not smoke or use any products that contain nicotine or tobacco. If you need help quitting, ask your doctor. Check your blood pressure at home as told by your doctor. Keep all follow-up visits. Medicines Take over-the-counter and prescription medicines   only as told by your doctor. Follow directions carefully. Do not skip doses of blood pressure medicine. The medicine does not work as well if you skip doses. Skipping doses also puts you at risk for problems. Ask your doctor about side effects or reactions to medicines that you should watch  for. Contact a doctor if: You think you are having a reaction to the medicine you are taking. You have headaches that keep coming back. You feel dizzy. You have swelling in your ankles. You have trouble with your vision. Get help right away if: You get a very bad headache. You start to feel mixed up (confused). You feel weak or numb. You feel faint. You have very bad pain in your: Chest. Belly (abdomen). You vomit more than once. You have trouble breathing. These symptoms may be an emergency. Get help right away. Call 911. Do not wait to see if the symptoms will go away. Do not drive yourself to the hospital. Summary Hypertension is another name for high blood pressure. High blood pressure forces your heart to work harder to pump blood. For most people, a normal blood pressure is less than 120/80. Making healthy choices can help lower blood pressure. If your blood pressure does not get lower with healthy choices, you may need to take medicine. This information is not intended to replace advice given to you by your health care provider. Make sure you discuss any questions you have with your health care provider. Document Revised: 03/29/2021 Document Reviewed: 03/29/2021 Elsevier Patient Education  2024 [employer]  

## 2023-01-22 NOTE — Progress Notes (Signed)
I,[name], CMA,acting as a Neurosurgeon for Angelica Aliment, MD.,have documented all relevant documentation on the behalf of Angelica Aliment, MD,as directed by  Angelica Aliment, MD while in the presence of Angelica Aliment, MD.  Subjective:  Patient ID: [name] , female    DOB: [birth year] , 77 y.o.   MRN: [id number]  Chief Complaint  Patient presents with   Hypertension   Diabetes    HPI  The patient is here today for a diabetes and blood pressure f/u. She reports compliance with meds. She denies chest pain, shortness of breath. She denies having any specific questions or concerns.   Diabetes She presents for her follow-up diabetic visit. She has type 2 diabetes mellitus. Her disease course has been stable. There are no hypoglycemic associated symptoms. Pertinent negatives for diabetes include no blurred vision and no chest pain. There are no hypoglycemic complications. Risk factors for coronary artery disease include dyslipidemia, diabetes mellitus, hypertension, obesity, post-menopausal and sedentary lifestyle. She is following a generally healthy diet. Her home blood glucose trend is fluctuating minimally. Her breakfast blood glucose is taken between 8-9 am. Her breakfast blood glucose range is generally 110-130 mg/dl. An ACE inhibitor/angiotensin II receptor blocker is being taken. Eye exam is current.  Hypertension This is a chronic problem. The current episode started more than 1 year ago. The problem has been gradually improving since onset. The problem is controlled. Pertinent negatives include no blurred vision, chest pain, palpitations or shortness of breath. Risk factors for coronary artery disease include diabetes mellitus, dyslipidemia, obesity, post-menopausal state and sedentary lifestyle. Past treatments include angiotensin blockers and diuretics.     Past Medical History:  Diagnosis Date   Arthritis    hands   Bladder infection    Carpal tunnel syndrome     Complication of anesthesia    Difficult to arouse   Diabetes mellitus    GERD (gastroesophageal reflux disease)    Hypertension    Sleep apnea      Family History  Problem Relation Age of Onset   Diabetes Mother    Hypertension Mother    Asthma Father    Heart attack Father 54   Stroke Father 55   Diabetes Father    Breast cancer Sister 51   Allergic rhinitis Brother    Diabetes Brother    Diabetes Brother    Atopy Paternal Grandmother      Current Outpatient Medications:    albuterol (VENTOLIN HFA) 108 (90 Base) MCG/ACT inhaler, Inhale 2 puffs into the lungs every 6 (six) hours as needed for wheezing or shortness of breath., Disp: 18 g, Rfl: 2   Ascorbic Acid (VITAMIN C) 1000 MG tablet, Take 1,000 mg by mouth daily., Disp: , Rfl:    aspirin EC 81 MG tablet, Take 1 tablet (81 mg total) by mouth 2 (two) times daily., Disp: 60 tablet, Rfl: 0   Azelastine HCl 0.15 % SOLN, Place 2 sprays into the nose daily., Disp: 30 mL, Rfl: 3   Bacillus Coagulans-Inulin (PROBIOTIC FORMULA PO), Take 1 capsule by mouth 2 (two) times a week., Disp: , Rfl:    Cholecalciferol (VITAMIN D) 50 MCG (2000 UT) tablet, Take 2,000 Units by mouth daily., Disp: , Rfl:    clotrimazole-betamethasone (LOTRISONE) cream, Apply topically 2 (two) times daily as needed. (Patient taking differently: Apply 1 Application topically 2 (two) times daily as needed (irritation).), Disp: 30 g, Rfl: 1   Coenzyme Q10 (COQ10) 100 MG CAPS, Take  100 mg by mouth daily with lunch. , Disp: , Rfl:    estradiol (ESTRACE) 0.1 MG/GM vaginal cream, Place 1 Applicatorful vaginally daily as needed (dryness)., Disp: , Rfl:    ezetimibe (ZETIA) 10 MG tablet, Take 1 tablet (10 mg total) by mouth daily., Disp: 30 tablet, Rfl: 11   famotidine (PEPCID) 20 MG tablet, One after supper, Disp: 90 tablet, Rfl: 3   fluticasone (FLONASE) 50 MCG/ACT nasal spray, Place 1 spray into both nostrils daily as needed for allergies or rhinitis., Disp: , Rfl:     metFORMIN (GLUCOPHAGE) 500 MG tablet, TAKE 1 TABLET BY MOUTH TWICE DAILY WITH A MEAL, Disp: 180 tablet, Rfl: 0   montelukast (SINGULAIR) 10 MG tablet, Take 1 tablet (10 mg total) by mouth at bedtime., Disp: 30 tablet, Rfl: 3   nystatin cream (MYCOSTATIN), Apply 1 Application topically 2 (two) times daily. prn, Disp: 30 g, Rfl: 0   OneTouch Delica Lancets 33G MISC, USE   TO CHECK GLUCOSE BEFORE BREAKFAST AND  DINNER, Disp: 100 each, Rfl: 0   ONETOUCH VERIO test strip, USE 1 STRIP TO CHECK GLUCOSE TWICE DAILY, Disp: 100 each, Rfl: 0   OXYGEN, Inhale 1 L into the lungs at bedtime as needed (oxygen)., Disp: , Rfl:    pantoprazole (PROTONIX) 40 MG tablet, Take 1 tablet (40 mg total) by mouth daily., Disp: 30 tablet, Rfl: 5   rosuvastatin (CRESTOR) 20 MG tablet, Take 20 mg by mouth daily., Disp: , Rfl:    valsartan-hydrochlorothiazide (DIOVAN-HCT) 80-12.5 MG tablet, Take 1 tablet by mouth daily., Disp: , Rfl:    carvedilol (COREG) 3.125 MG tablet, Take 1 tablet (3.125 mg total) by mouth 2 (two) times daily., Disp: 180 tablet, Rfl: 1   EPINEPHrine 0.3 mg/0.3 mL IJ SOAJ injection, Inject 0.3 mg into the muscle as needed for anaphylaxis. (Patient not taking: Reported on 12/25/2022), Disp: 1 each, Rfl: 2   furosemide (LASIX) 20 MG tablet, Take 1 tablet (20 mg total) by mouth daily as needed (fluid)., Disp: 90 tablet, Rfl: 1   nystatin (MYCOSTATIN/NYSTOP) powder, Apply 1 Application topically 2 (two) times daily. To affected area(s), Disp: 60 g, Rfl: 1   potassium chloride SA (KLOR-CON M) 20 MEQ tablet, One tab po every day prn, with each dose of furosemide., Disp: 90 tablet, Rfl: 1   Allergies  Allergen Reactions   Pravastatin Hives   Haemophilus Influenzae Vaccines     Nausea, pain, body weakness   Sulfa Antibiotics Itching   Sulfonamide Derivatives Itching   Tetanus Toxoid, Adsorbed Other (See Comments)   Tetanus Toxoids Swelling    Arm swelled at injection site     Review of Systems   Constitutional: Negative.   HENT: Negative.    Eyes:  Negative for blurred vision.  Respiratory: Negative.  Negative for shortness of breath.   Cardiovascular: Negative.  Negative for chest pain and palpitations.  Gastrointestinal: Negative.   Musculoskeletal: Negative.   Neurological: Negative.   Psychiatric/Behavioral: Negative.       Today's Vitals   01/22/23 1110  BP: 118/74  Pulse: (!) 56  Temp: 98.6 F (37 C)  SpO2: 98%  Weight: 210 lb 3.2 oz (95.3 kg)  Height: 5\' 3"  (1.6 m)   Body mass index is 37.24 kg/m.  Wt Readings from Last 3 Encounters:  01/22/23 210 lb 3.2 oz (95.3 kg)  12/25/22 213 lb (96.6 kg)  12/10/22 217 lb (98.4 kg)     Objective:  Physical Exam Vitals and nursing note reviewed.  Constitutional:      Appearance: Normal appearance. She is obese.  HENT:     Head: Normocephalic and atraumatic.  Eyes:     Extraocular Movements: Extraocular movements intact.  Cardiovascular:     Rate and Rhythm: Normal rate and regular rhythm.     Heart sounds: Normal heart sounds.  Pulmonary:     Effort: Pulmonary effort is normal.     Breath sounds: Normal breath sounds.  Musculoskeletal:     Cervical back: Normal range of motion.  Skin:    General: Skin is warm.     Findings: Rash present.     Comments: Erythematous rash in b/l groin, no vesicular lesions noted  Neurological:     General: No focal deficit present.     Mental Status: She is alert.  Psychiatric:        Mood and Affect: Mood normal.        Behavior: Behavior normal.         Assessment And Plan:  Essential hypertension, benign Assessment & Plan: Chronic, well controlled.  She will continue with valsartan/hct 80/12.5mg  daily along with carvedilol 3.125mg  twice daily. She is encouraged to follow a low sodium diet.   Orders: -     CMP14+EGFR -     TSH  Dyslipidemia associated with type 2 diabetes mellitus (HCC) Assessment & Plan: Chronic, LDL goal < 70.  She will continue with  rosuvastatin daily and metformin twice daily. She will f/u in four months for re-evaluation.   Orders: -     CMP14+EGFR -     Hemoglobin A1c -     Lipid panel; Future  Abdominal aortic atherosclerosis (HCC) Assessment & Plan: Chronic, LDL goal < 70.  She will c/w rosuvastatin 20mg  daily. She is encouraged to follow a heart healthy lifestyle.    Intertrigo Assessment & Plan: She is advised to use nystatin cream in am and nystatin powder in pm. She will let me know if her sx persist.    Class 2 severe obesity due to excess calories with serious comorbidity and body mass index (BMI) of 37.0 to 37.9 in adult [facility]) Assessment & Plan: She is encouraged to strive for BMI<30 to decrease cardiac risk. Advised to aim for at least 150 minutes of exercise per week.      Other orders -     Carvedilol; Take 1 tablet (3.125 mg total) by mouth 2 (two) times daily.  Dispense: 180 tablet; Refill: 1 -     Furosemide; Take 1 tablet (20 mg total) by mouth daily as needed (fluid).  Dispense: 90 tablet; Refill: 1 -     Potassium Chloride Crys ER; One tab po every day prn, with each dose of furosemide.  Dispense: 90 tablet; Refill: 1 -     Ezetimibe; Take 1 tablet (10 mg total) by mouth daily.  Dispense: 30 tablet; Refill: 11 -     Nystatin; Apply 1 Application topically 2 (two) times daily. To affected area(s)  Dispense: 60 g; Refill: 1 -     Nystatin; Apply 1 Application topically 2 (two) times daily. prn  Dispense: 30 g; Refill: 0  She is encouraged to strive for BMI less than 30 to decrease cardiac risk. Advised to aim for at least 150 minutes of exercise per week.    Return in about 12 weeks (around 04/16/2023), or lab visit - chol check, for 4 month DM.  Patient was given opportunity to ask questions. Patient verbalized understanding of the plan  and was able to repeat key elements of the plan. All questions were answered to their satisfaction.    I, Angelica Aliment, MD, have reviewed all  documentation for this visit. The documentation on 01/22/23 for the exam, diagnosis, procedures, and orders are all accurate and complete.   IF YOU HAVE BEEN REFERRED TO A SPECIALIST, IT MAY TAKE 1-2 WEEKS TO SCHEDULE/PROCESS THE REFERRAL. IF YOU HAVE NOT HEARD FROM US/SPECIALIST IN TWO WEEKS, PLEASE GIVE [place] A CALL AT [phone].   THE PATIENT IS ENCOURAGED TO PRACTICE SOCIAL DISTANCING DUE TO THE COVID-19 PANDEMIC.

## 2023-01-26 DIAGNOSIS — L304 Erythema intertrigo: Secondary | ICD-10-CM | POA: Insufficient documentation

## 2023-01-26 MED ORDER — NYSTATIN 100000 UNIT/GM EX CREA: 1.0000 | TOPICAL_CREAM | Freq: Two times a day (BID) | CUTANEOUS | 0 refills | Status: DC

## 2023-01-26 NOTE — Assessment & Plan Note (Signed)
She is advised to use nystatin cream in am and nystatin powder in pm. She will let me know if her sx persist.

## 2023-01-26 NOTE — Assessment & Plan Note (Addendum)
Chronic, LDL goal < 70.  She will continue with rosuvastatin daily and metformin twice daily. She will f/u in four months for re-evaluation.

## 2023-01-26 NOTE — Assessment & Plan Note (Signed)
Chronic, LDL goal < 70.  She will c/w rosuvastatin 20mg  daily. She is encouraged to follow a heart healthy lifestyle.

## 2023-01-26 NOTE — Assessment & Plan Note (Signed)
Chronic, well controlled.  She will continue with valsartan/hct 80/12.5mg  daily along with carvedilol 3.125mg  twice daily. She is encouraged to follow a low sodium diet.

## 2023-01-26 NOTE — Assessment & Plan Note (Signed)
She is encouraged to strive for BMI<30 to decrease cardiac risk. Advised to aim for at least 150 minutes of exercise per week.

## 2023-02-02 DIAGNOSIS — G4733 Obstructive sleep apnea (adult) (pediatric): Secondary | ICD-10-CM | POA: Diagnosis not present

## 2023-02-02 DIAGNOSIS — R0602 Shortness of breath: Secondary | ICD-10-CM | POA: Diagnosis not present

## 2023-02-02 DIAGNOSIS — I10 Essential (primary) hypertension: Secondary | ICD-10-CM | POA: Diagnosis not present

## 2023-02-02 DIAGNOSIS — R0600 Dyspnea, unspecified: Secondary | ICD-10-CM | POA: Diagnosis not present

## 2023-02-24 NOTE — Progress Notes (Deleted)
[name], female    DOB: [birth year]   MRN: [id number]   Brief patient profile:  71 yobf never smoker  with ? H/o allergic rhinitis but  much worse since spring 2020 rhinitis itching sneezing and then coughing no better with nyquil  referred to pulmonary clinic 06/05/2022 by [name]  for cough.   Allergy w/u  11/29/21 :   strongly Pos IgE 559 multiple pos RAST  dust mite, grass pollen, cockroach, molds, tree pollen and weed pollen    but skin testing neg > no better on allegra/ singulair, atrovent nasa spray     History of Present Illness  06/05/2022  Pulmonary/ 1st office eval/[name]  Chief Complaint  Patient presents with   Consult    Pt states she a chronic productive cough for the past 8 months.  Dyspnea:  new and doe 100 ft to street and back x 4 months    Cough: sporadic minimal white / assoc with nasal d/c  Sleep: 10 degrees , sleeps  better p rx with tessalon pearls  SABA use: seems to help though not very poor hfa technique Rec Pantoprazole (protonix) 40 mg   Take  30-60 min before first meal of the day and Pepcid (famotidine)  20 mg after supper until return to office   GERD diet reviewed, bed blocks rec  For drainage / drippy nose throat tickle try off Zyrtec  take as needed  CHLORPHENIRAMINE  4 mg   Prednisone 10 mg take  4 each am x 2 days,   2 each am x 2 days,  1 each am x 2 days and stop  If prednisone really helps you, start symbicort 80 Take 2 puffs first thing in am and then another 2 puffs about 12 hours later. (Call for prescription) For breathing or can't stop coughing  > albuterol 2 puff every 4 hours as needed but work on smooth deep breath like we showed you here.  Please schedule a follow up office visit in 4 weeks, call sooner if needed with all medications /inhalers/ solutions in hand    07/17/2022  f/u ov/[name] re: cough x years but worse since spring 2020   - brought some  meds, not following above instructions, could not take pred pills, L  knee injection ok tol steroiods/  Chief Complaint  Patient presents with   Follow-up    Cough, PND and white mucus with cough.  Runny nose.  Was not able to take prednisone.  Only took 2 tablets, caused chest and left breast pain.  Dyspnea:  variable  Cough: thick white  Sleeping: elevates most nights /twice daily wakes up and feels needs saba to control cough but not using correctly  SABA use: avg  2-3 x days Still using cough drops "but they are sugarless like you said"  Rec Pantoprazole (protonix) 40 mg   Take  30-60 min before first meal of the day and Pepcid (famotidine)  20 mg after supper until return to office   GERD diet reviewed, bed blocks rec  CHLORPHENIRAMINE  4 mg  ("Allergy Relief" 4mg    For breathing or can't stop coughing  > albuterol 2 puff every 4 hours as needed but work on smooth deep breath like we showed you here. Depomedrol 120 mg IM - if much better for 5 days then worse again, I will call you in new inhaler Please schedule a follow up office visit in 4 weeks, call sooner if needed with all  medications /inhalers/ solutions in hand   08/14/2022  f/u ov/[name] re: cough x spring 2020    maint on ppi q am (no pepcid)  overall much better, still globus sensation/ brought all meds but in one bag not sep maint vs prns Chief Complaint  Patient presents with   Follow-up    Occasional cough with a lot of mucus production.  Clear mucus  Dyspnea:  not limited by doe but very inactive  Cough: feels like it gets hung in throat min white  Sleeping: flat bed / pillows and h1 x 2 helps more than anything else to date SABA use: twice a day not really clear why, not needing noct  02: prn 5 years irritates throat  Covid status:   vax max  Rec No change in medication except add pepcid 20 mg after supper  For cough mucinex dm 1200 twice daily with a glass of water Only use your albuterol as a rescue medication to be used if you can't catch your breath Also  Ok to try albuterol 15  min before an activity (on alternating days)  that you know would usually make you short of breath    Work on inhaler technique:    Add: next step is ent eval/ [facility]       02/25/2023  f/u ov/[name] re: ***   maint on ***  No chief complaint on file.   Dyspnea:  *** Cough: *** Sleeping: *** resp cc  SABA use: *** 02: ***  Lung cancer screening :  ***    No obvious day to day or daytime variability or assoc excess/ purulent sputum or mucus plugs or hemoptysis or cp or chest tightness, subjective wheeze or overt sinus or hb symptoms.    Also denies any obvious fluctuation of symptoms with weather or environmental changes or other aggravating or alleviating factors except as outlined above   No unusual exposure hx or h/o childhood pna/ asthma or knowledge of premature birth.  Current Allergies, Complete Past Medical History, Past Surgical History, Family History, and Social History were reviewed in [employer] record.  ROS  The following are not active complaints unless bolded Hoarseness, sore throat, dysphagia, dental problems, itching, sneezing,  nasal congestion or discharge of excess mucus or purulent secretions, ear ache,   fever, chills, sweats, unintended wt loss or wt gain, classically pleuritic or exertional cp,  orthopnea pnd or arm/hand swelling  or leg swelling, presyncope, palpitations, abdominal pain, anorexia, nausea, vomiting, diarrhea  or change in bowel habits or change in bladder habits, change in stools or change in urine, dysuria, hematuria,  rash, arthralgias, visual complaints, headache, numbness, weakness or ataxia or problems with walking or coordination,  change in mood or  memory.        No outpatient medications have been marked as taking for the 02/25/23 encounter (Appointment) with Nyoka Cowden, MD.                   Past Medical History:  Diagnosis Date   Arthritis    hands   Bladder infection    Carpal tunnel  syndrome    Complication of anesthesia    Difficult to arouse   Diabetes mellitus    GERD (gastroesophageal reflux disease)    Hypertension    Sleep apnea         Objective:    wts   02/25/2023        ***  08/14/2022  211   07/17/22 215 lb 9.6 oz (97.8 kg)  07/10/22 214 lb (97.1 kg)  06/05/22 214 lb 12.8 oz (97.4 kg)      Vital signs reviewed  02/25/2023  - Note at rest 02 sats  ***% on ***   General appearance:    ***        Assessment

## 2023-02-25 ENCOUNTER — Ambulatory Visit: Payer: Medicare HMO | Admitting: Internal Medicine

## 2023-02-28 ENCOUNTER — Other Ambulatory Visit: Payer: Self-pay | Admitting: Internal Medicine

## 2023-03-03 DIAGNOSIS — R531 Weakness: Secondary | ICD-10-CM | POA: Diagnosis not present

## 2023-03-05 DIAGNOSIS — I10 Essential (primary) hypertension: Secondary | ICD-10-CM | POA: Diagnosis not present

## 2023-03-05 DIAGNOSIS — G4733 Obstructive sleep apnea (adult) (pediatric): Secondary | ICD-10-CM | POA: Diagnosis not present

## 2023-03-05 DIAGNOSIS — R0602 Shortness of breath: Secondary | ICD-10-CM | POA: Diagnosis not present

## 2023-03-05 DIAGNOSIS — R531 Weakness: Secondary | ICD-10-CM | POA: Diagnosis not present

## 2023-03-05 DIAGNOSIS — R0600 Dyspnea, unspecified: Secondary | ICD-10-CM | POA: Diagnosis not present

## 2023-03-10 ENCOUNTER — Encounter: Payer: Self-pay | Admitting: Internal Medicine

## 2023-03-10 DIAGNOSIS — R531 Weakness: Secondary | ICD-10-CM | POA: Diagnosis not present

## 2023-03-13 DIAGNOSIS — R531 Weakness: Secondary | ICD-10-CM | POA: Diagnosis not present

## 2023-03-17 DIAGNOSIS — R531 Weakness: Secondary | ICD-10-CM | POA: Diagnosis not present

## 2023-03-24 DIAGNOSIS — R8271 Bacteriuria: Secondary | ICD-10-CM | POA: Diagnosis not present

## 2023-03-24 DIAGNOSIS — N3020 Other chronic cystitis without hematuria: Secondary | ICD-10-CM | POA: Diagnosis not present

## 2023-03-27 ENCOUNTER — Ambulatory Visit (INDEPENDENT_AMBULATORY_CARE_PROVIDER_SITE_OTHER): Payer: Medicare HMO | Admitting: Otolaryngology

## 2023-03-27 ENCOUNTER — Encounter (INDEPENDENT_AMBULATORY_CARE_PROVIDER_SITE_OTHER): Payer: Self-pay | Admitting: Otolaryngology

## 2023-03-27 VITALS — BP 135/69 | HR 58

## 2023-03-27 DIAGNOSIS — K449 Diaphragmatic hernia without obstruction or gangrene: Secondary | ICD-10-CM

## 2023-03-27 DIAGNOSIS — R590 Localized enlarged lymph nodes: Secondary | ICD-10-CM | POA: Diagnosis not present

## 2023-03-27 DIAGNOSIS — K219 Gastro-esophageal reflux disease without esophagitis: Secondary | ICD-10-CM

## 2023-03-27 DIAGNOSIS — J343 Hypertrophy of nasal turbinates: Secondary | ICD-10-CM

## 2023-03-27 DIAGNOSIS — R053 Chronic cough: Secondary | ICD-10-CM

## 2023-03-27 DIAGNOSIS — G4733 Obstructive sleep apnea (adult) (pediatric): Secondary | ICD-10-CM

## 2023-03-27 DIAGNOSIS — R0981 Nasal congestion: Secondary | ICD-10-CM

## 2023-03-27 DIAGNOSIS — J3489 Other specified disorders of nose and nasal sinuses: Secondary | ICD-10-CM

## 2023-03-27 DIAGNOSIS — R0982 Postnasal drip: Secondary | ICD-10-CM | POA: Diagnosis not present

## 2023-03-27 DIAGNOSIS — H6991 Unspecified Eustachian tube disorder, right ear: Secondary | ICD-10-CM

## 2023-03-27 DIAGNOSIS — J3089 Other allergic rhinitis: Secondary | ICD-10-CM

## 2023-03-27 NOTE — Patient Instructions (Signed)
-   schedule your soft tissue neck U/S - Flonase and Allergy medications (Allegra/Singulair) - continue reflux medications  See information about Eustachian Tube Dysfunction below:    Overview The eustachian (say "you-STAY-shee-un") tubes connect the middle ear on each side to the back of the throat. They keep air pressure stable in the ears. If your eustachian tubes become blocked, the air pressure in your ears changes. A quick change in air pressure can cause eustachian tubes to close up. This might happen when an airplane changes altitude or when a scuba diver goes up or down underwater. And a cold can make the tubes swell and block the fluid in the middle ear from draining out. That can cause pain.  Eustachian tube problems often clear up on their own or after treating the cause of the blockage. If your tubes continue to be blocked, you may need surgery.  Follow-up care is a key part of your treatment and safety. Be sure to make and go to all appointments, and call your doctor or nurse advice line (811 in most provinces and territories) if you are having problems. It's also a good idea to know your test results and keep a list of the medicines you take.  How can you care for yourself at home? Try a simple exercise to help open blocked tubes. Close your mouth, hold your nose, and gently blow as if you are blowing your nose. Yawning and chewing gum also may help. You may hear or feel a "pop" when the tubes open. To ease ear pain, apply a warm face cloth or a heating pad set on low. There may be some drainage from the ear when the heat melts earwax. Put a cloth between the heat source and your skin. If your doctor prescribed antibiotics, take them as directed. Do not stop taking them just because you feel better. You need to take the full course of antibiotics. Be safe with medicines. Depending on the cause of the problem, your doctor may recommend over-the-counter medicine. For example, adults may  try decongestants for cold symptoms or nasal spray steroids for allergies. Follow the instructions carefully.

## 2023-03-27 NOTE — Progress Notes (Signed)
ENT Progress Note:  Update 03/27/23: She returns after hearing evaluation, which showed symmetric bilateral sensorineural hearing loss with excellent word recognition score. She is on Chief Strategy Officer.  She reports persistent cough with clear mucus.  She noticed some discomfort when she coughs along the right superior neck near the border of the lower jaw and superior aspect of SCM, only bothering her when she coughs forcefully.  She has been taking reflux medications.  Continues to have some ear pressure and muffled hearing on the right from time to time.   Audiogram results  Tympanometry results were consistent with normal middle ear function, bilaterally   Audiometric testing was completed using conventional audiometry with supraural transducer. Speech Recognition Thresholds were 15dB in the right ear and 10dB in the left ear. Word Recognition was performed 40dB SL, scored 100% in the right ear and 100% in the left ear. Pure tone thresholds show normal hearing 250-6kHz sloping to a mild hearing loss 8kHz only bilaterally.   Initial Evaluation 12/25/2022:  Reason for Consult: chronic cough    Referring Physician:  Ames Dura, NP [facility]  HPI: NADIAH CORBIT is an 77 y.o. female with hx HTN, T2DM, OSA (stopped using CPAP 2/2 poor tolerance), BMI of 37, nocturnal hypoxemia, on O2 at night, GERD, hiatal hernia, chronic cough for over a year, is here for initial evaluation with [place]. She reports post-nasal drip and congestion, had positive RAST. She had negative CXR 05/2022.  She was seen by Cards 11/14/22 and is f/b them, near normal EF when underwent testing.   She is on Vansartan-hydrochlorothiazide combo medication. She is on Pantoprazole 40 mg am and Famotidine 20 mg for GERD.   Cough is productive with clear mucus, coughs at night and during the day.   She is on Flonase BID. She is on Albuterol. She has dyspnea on exertion and was given albuterol TID daily - it helps with  dyspnea. She is on OTC Allegra. Both did not help with cough. She saw GI several years ago and was diagnosed with small hiatal hernia based on EGD.  Never smoked. Denies hx of lung disease.   Records Reviewed:  Pending CT chest w/o contrast and CT Max/Face and scheduled for 12/30/2022  Note by Ames Dura, NP 12/10/2022 "77 year old female, never smoked.  Past medical history significant for cough variant asthma, chronic rhinitis.  Patient of Dr. Sherene Sires, last seen in office on 08/14/2022.   Onset worsening of chronic rhinitis spring 2020 follow by onset of chronic cough  - [facility] w/u  11/29/21 :   strongly Pos IgE 559 multiple pos RAST  dust mite, grass pollen, cockroach, molds, tree pollen and weed pollen    but skin testing neg > no better on allegra/ singulair, atrovent nasa spray - 06/05/2022   Walked on RA  x  3  lap(s) =  approx 750  ft  @ fast pace, stopped due to end of study  with lowest 02 sats 98% s sob /cough /cp   - FENO 06/05/2022  = 11      On Atrovent/Albuterol and PPI (Protonix 40 mg daily) On Azelastine  Chronic cough for >2 years  No better on Allegra/Singulair or Atrovent CXR in December was clear Needs CT imaging of chest and sinuses Referring to ENT   ALLERGIC RHINITIS CAUSE UNSPECIFIED - Continue over-the-counter antihistamine and Atrovent nasal spray - Strongly positive allergy testing in June 2023/ FENO normal 06/05/22 - May want to discuss allergy shots/biologics at  follow-up      12/10/2022- interim hx  Presents today for acute office visit. Patient of Dr. Sherene Sires, last seen in February 2024 for chronic rhinitis and chronic cough. Not felt to be true asthma.  Allergy testing positive in June 2023.  Recommended Max GERD treatment + h1 blocker pepcid 20mg  at bedtime / mucinex dm - refer to [facility] voice center if not improved with above.   She had a flare up three weeks ago where she reports having increased sob and productive cough. Associated sneezing and running  nose. Symptoms have eased up. Cough is her biggest complaint. Cough is productive with clear mucus. She is taking OTC antihistamine and Atrovent nasal spray as directed. Reports no significant improvement from Singulair or when taking prednisone. She uses albuterol three times a day. Needs refill of Albuterol, nasal spray and protonix.       Past Medical History:  Diagnosis Date   Arthritis    hands   Bladder infection    Carpal tunnel syndrome    Complication of anesthesia    Difficult to arouse   Diabetes mellitus    GERD (gastroesophageal reflux disease)    Hypertension    Sleep apnea     Past Surgical History:  Procedure Laterality Date   ABDOMINAL HYSTERECTOMY  1985   partial   APPENDECTOMY     age 34   CARPAL TUNNEL RELEASE Bilateral 1980   cataract surgery Right 07/07/2020   Dr. Dione Booze   cataract surgery Left 12/2020   HERNIA REPAIR  1990   TONSILLECTOMY     as a child   TOTAL KNEE ARTHROPLASTY Right 04/19/2019   Procedure: RIGHT TOTAL KNEE ARTHROPLASTY;  Surgeon: Gean Birchwood, MD;  Location: [facility];  Service: Orthopedics;  Laterality: Right;   TOTAL KNEE ARTHROPLASTY Left 05/21/2021   Procedure: LEFT TOTAL KNEE ARTHROPLASTY;  Surgeon: Gean Birchwood, MD;  Location: [facility];  Service: Orthopedics;  Laterality: Left;    Family History  Problem Relation Age of Onset   Diabetes Mother    Hypertension Mother    Asthma Father    Heart attack Father 50   Stroke Father 56   Diabetes Father    Breast cancer Sister 66   Allergic rhinitis Brother    Diabetes Brother    Diabetes Brother    Atopy Paternal Grandmother     Social History:  reports that she has never smoked. She has never been exposed to tobacco smoke. She has never used smokeless tobacco. She reports that she does not drink alcohol and does not use drugs.  Allergies:  Allergies  Allergen Reactions   Pravastatin Hives   Haemophilus Influenzae Vaccines     Nausea, pain, body weakness   Sulfa Antibiotics  Itching   Sulfonamide Derivatives Itching   Tetanus Toxoid, Adsorbed Other (See Comments)   Tetanus Toxoids Swelling    Arm swelled at injection site    Medications: I have reviewed the patient's current medications.  No results found for this or any previous visit (from the past 48 hour(s)).  The PMH, PSH, Medications, Allergies, and SH were reviewed and updated.  ROS: Constitutional: Negative for fever, weight loss and weight gain. Cardiovascular: Negative for chest pain and dyspnea on exertion. Respiratory: Is not experiencing shortness of breath at rest. (+) chronic cough  Gastrointestinal: Negative for nausea and vomiting. Neurological: Negative for headaches. Psychiatric: The patient is not nervous/anxiou  Blood pressure 135/69, pulse (!) 58, SpO2 98%.  PHYSICAL EXAM:  Exam: General:  Well-developed, well-nourished Communication and Voice: slightly raspy, dry cough on exam Respiratory Respiratory effort: Equal inspiration and expiration without stridor Cardiovascular Peripheral Vascular: Warm extremities with equal color/perfusion Eyes: No nystagmus with equal extraocular motion bilaterally Neuro/Psych/Balance: Patient oriented to person, place, and time; Appropriate mood and affect; Gait is intact with no imbalance; Cranial nerves I-XII are intact Head and Face Inspection: Normocephalic and atraumatic without mass or lesion Palpation: Facial skeleton intact without bony stepoffs Salivary Glands: No mass or tenderness Facial Strength: Facial motility symmetric and full bilaterally ENT Pinna: External ear intact and fully developed External canal: Canal is patent with intact skin Tympanic Membrane: Clear and mobile External Nose: No scar or anatomic deformity Internal Nose: Septum intact and midline. No edema, polyp, or rhinorrhea Lips, Teeth, and gums: Mucosa and teeth intact and viable TMJ: No pain to palpation with full mobility Oral cavity/oropharynx: No  erythema or exudate, no lesions present Nasopharynx: No mass or lesion with intact mucosa Hypopharynx: Intact mucosa without pooling of secretions Larynx Glottic: Full true vocal cord mobility without lesion or mass Supraglottic: Normal appearing epiglottis and AE folds Interarytenoid Space: No or minimal pachydermia or edema Subglottic Space: Patent without lesion or edema Neck Neck and Trachea: Midline trachea without mass or lesion Thyroid: No mass or nodularity Lymphatics: No lymphadenopathy  Procedure performed during last office visit   Preoperative diagnosis: chronic cough   Postoperative diagnosis:   Same   Procedure: Flexible fiberoptic laryngoscopy  Surgeon: Ashok Croon, MD  Anesthesia: Topical lidocaine and Afrin Complications: None Condition is stable throughout exam  Indications and consent:  The patient presents to the clinic with Indirect laryngoscopy view was incomplete. Thus it was recommended that they undergo a flexible fiberoptic laryngoscopy. All of the risks, benefits, and potential complications were reviewed with the patient preoperatively and verbal informed consent was obtained.  Procedure: The patient was seated upright in the clinic. Topical lidocaine and Afrin were applied to the nasal cavity. After adequate anesthesia had occurred, I then proceeded to pass the flexible telescope into the nasal cavity. The nasal cavity was patent without rhinorrhea or polyp. The nasopharynx was also patent without mass or lesion. The base of tongue was visualized and was normal. There were no signs of pooling of secretions in the piriform sinuses. The true vocal folds were mobile bilaterally. There were no signs of glottic or supraglottic mucosal lesion or mass. There was mild interarytenoid pachydermia and post cricoid edema. The telescope was then slowly withdrawn and the patient tolerated the procedure throughout.  Studies Reviewed: CXR - negative (done  05/2022)  CT sinuses done 12/30/22 FINDINGS: Paranasal sinuses:   Frontal: Normally aerated. Patent frontal sinus drainage pathways.   Ethmoid: Normally aerated.   Maxillary: Normally aerated. There is a polyp or mucous retention cysts in the inferior left maxillary sinus measuring 13 x 8 by 9 mm.   Sphenoid: Normally aerated. Patent sphenoethmoidal recesses.   Right ostiomeatal unit: Patent.   Left ostiomeatal unit: Patent.   Nasal passages: Patent. Intact nasal septum is midline.   Other: Orbits and intracranial compartment are unremarkable. Visible mastoid air cells are normally aerated.   IMPRESSION: 1. Normally aerated paranasal sinuses. Patent sinus drainage pathways. 2. Small polyp or mucous retention cyst in the inferior left maxillary sinus measuring 13 x 8 x 9 mm.  12/30/22 CT Chest  IMPRESSION: 1. No acute abnormality. 2. Mild linear atelectasis/scarring at both lung bases. 3. 4 mm nonobstructing right renal calculus. 4.  Calcific coronary artery and  aortic atherosclerosis.  Assessment/Plan: No diagnosis found.  77 y.o. female with hx HTN, T2DM, OSA (stopped using CPAP 2/2 poor tolerance), BMI of 37, nocturnal hypoxemia, on O2 at night, GERD, hiatal hernia, chronic cough for over a year, is here for initial evaluation with [place]. She reports post-nasal drip and congestion, had positive RAST. She had negative CXR 05/2022, and f/b Pulm. Exam including flexible laryngoscopy without lesions or masses, mild post-cricoid edema c/w GERD and nasal mucosal edema/ITH c/w allergies/PND.   - I discussed common etiologies of chronic cough including GERD/allergies and PND and lung disease with the patient and explained that in most cases the cause of chronic cough is multi-factorial - we discussed that she needs to ask her PCP about an alternative to hydrochlorothiazide for HTN since this could be causing her cough sx - start Azelastine and continue Flonase  - continue Allegra or  change to a different antihistamine - start Singulair in addition to antihistamine - start alginates (Amazon supplement) and continue two other reflux medications for now - will try to wean if cough improves if she tolerates - return in 3 months for sx check and imaging review  - will consider dx of neurogenic cough in the future (will offer medical management or SLN Block) - she endorses decreased hearing on the right side, gradually worse over time - will order screening Audiogram, normal ear exam today (likely ETD vs age related hearing loss)  Update 03/27/23 She returns after hearing evaluation, which showed symmetric bilateral sensorineural hearing loss with excellent word recognition score. She is on Chief Strategy Officer.  She reports persistent cough with clear mucus.  She noticed some discomfort when she coughs along the right superior neck near the border of the lower jaw and superior aspect of SCM, only bothering her when she coughs forcefully.  She has been taking reflux medications.  Continues to have some ear pressure and muffled hearing on the right from time to time. Pain along the right SCM/neck when coughing with palpable lymph node on exam today  - Soft tissue neck U/S to see if reactive lymphadenopathy - will consider CT neck in the future if symptoms persist and soft tissue ultrasound findings are concerning to warrant additional imaging -I also discussed with the patient that the pain discomfort could be musculoskeletal or related to cervical spine  2. Right ear pressure, muffled hearing -likely related to eustachian tube dysfunction no masses in the nasopharynx during the flexible scope exam on initial evaluation and audiogram with mild symmetric bilateral sensorineural hearing loss in high frequencies consistent with age-related hearing loss -Continue with Allegra 180 mg daily and azelastine 2 puffs bilateral nares twice daily -Continue Singulair 10 mg 3. GERD/LPR hx of  hiatal hernia -Continue Pepcid 20 mg daily and Protonix 40 mg daily 4.  Chronic cough She reports post-nasal drip and congestion, had positive RAST. She had negative CXR 05/2022. Exam including flexible laryngoscopy without lesions or masses, mild post-cricoid edema c/w GERD and nasal mucosal edema/ITH c/w allergies/PND. CT chest and sinus CT done 12/30/22 which I reviewed, and revealed small pulmonary nodule and minimal atelectasis/scarring, and well-aerated paranasal sinuses with small mucus retention cyst in the left maxillary sinuses.   -discussed common etiologies of chronic cough including GERD/allergies and PND and lung disease with the patient and explained that in most cases the cause of chronic cough is multi-factorial - we discussed that she needs to ask her PCP about an alternative to hydrochlorothiazide for HTN since this could  be causing her cough sx -Medical management of postnasal drainage with Allegra Singulair and nasal sprays including Flonase and azelastine - start alginates (Amazon supplement) and continue two other reflux medications for now - will try to wean if cough improves if she tolerates - will consider dx of neurogenic cough in the future (will offer medical management or SLN Block)  RTC in a few weeks for sx check and imaging review    Thank you for allowing me to participate in the care of this patient. Please do not hesitate to contact me with any questions or concerns.   Ashok Croon, MD Otolaryngology [facility] Phone: [phone] Fax: [phone]   03/27/2023, 1:10 PM

## 2023-04-01 DIAGNOSIS — Z96652 Presence of left artificial knee joint: Secondary | ICD-10-CM | POA: Diagnosis not present

## 2023-04-01 DIAGNOSIS — M25562 Pain in left knee: Secondary | ICD-10-CM | POA: Diagnosis not present

## 2023-04-04 ENCOUNTER — Ambulatory Visit (HOSPITAL_COMMUNITY)
Admission: RE | Admit: 2023-04-04 | Discharge: 2023-04-04 | Disposition: A | Discharge status: Home / Self Care | Payer: Medicare HMO | Source: Ambulatory Visit | Attending: Otolaryngology | Admitting: Otolaryngology

## 2023-04-04 DIAGNOSIS — G4733 Obstructive sleep apnea (adult) (pediatric): Secondary | ICD-10-CM | POA: Diagnosis not present

## 2023-04-04 DIAGNOSIS — M542 Cervicalgia: Secondary | ICD-10-CM | POA: Diagnosis not present

## 2023-04-04 DIAGNOSIS — R0600 Dyspnea, unspecified: Secondary | ICD-10-CM | POA: Diagnosis not present

## 2023-04-04 DIAGNOSIS — I10 Essential (primary) hypertension: Secondary | ICD-10-CM | POA: Diagnosis not present

## 2023-04-04 DIAGNOSIS — R0602 Shortness of breath: Secondary | ICD-10-CM | POA: Diagnosis not present

## 2023-04-04 DIAGNOSIS — R590 Localized enlarged lymph nodes: Secondary | ICD-10-CM | POA: Insufficient documentation

## 2023-04-17 ENCOUNTER — Other Ambulatory Visit: Payer: Medicare HMO

## 2023-04-17 DIAGNOSIS — E1169 Type 2 diabetes mellitus with other specified complication: Secondary | ICD-10-CM | POA: Diagnosis not present

## 2023-04-17 DIAGNOSIS — E785 Hyperlipidemia, unspecified: Secondary | ICD-10-CM

## 2023-04-18 LAB — LIPID PANEL
Chol/HDL Ratio: 2.7 ratio (ref 0.0–4.4)
Cholesterol, Total: 206 mg/dL — ABNORMAL HIGH (ref 100–199)
HDL: 77 mg/dL (ref >39)
LDL Chol Calc (NIH): 118 mg/dL — ABNORMAL HIGH (ref 0–99)
Triglycerides: 59 mg/dL (ref 0–149)
VLDL Cholesterol Cal: 11 mg/dL (ref 5–40)

## 2023-05-05 DIAGNOSIS — R0602 Shortness of breath: Secondary | ICD-10-CM | POA: Diagnosis not present

## 2023-05-05 DIAGNOSIS — G4733 Obstructive sleep apnea (adult) (pediatric): Secondary | ICD-10-CM | POA: Diagnosis not present

## 2023-05-05 DIAGNOSIS — R0600 Dyspnea, unspecified: Secondary | ICD-10-CM | POA: Diagnosis not present

## 2023-05-05 DIAGNOSIS — I10 Essential (primary) hypertension: Secondary | ICD-10-CM | POA: Diagnosis not present

## 2023-05-08 ENCOUNTER — Encounter (INDEPENDENT_AMBULATORY_CARE_PROVIDER_SITE_OTHER): Payer: Self-pay | Admitting: Otolaryngology

## 2023-05-08 ENCOUNTER — Ambulatory Visit (INDEPENDENT_AMBULATORY_CARE_PROVIDER_SITE_OTHER): Payer: Medicare HMO | Admitting: Otolaryngology

## 2023-05-08 VITALS — BP 137/74 | HR 79

## 2023-05-08 DIAGNOSIS — J343 Hypertrophy of nasal turbinates: Secondary | ICD-10-CM | POA: Diagnosis not present

## 2023-05-08 DIAGNOSIS — J3489 Other specified disorders of nose and nasal sinuses: Secondary | ICD-10-CM | POA: Diagnosis not present

## 2023-05-08 DIAGNOSIS — H6993 Unspecified Eustachian tube disorder, bilateral: Secondary | ICD-10-CM

## 2023-05-08 DIAGNOSIS — R0982 Postnasal drip: Secondary | ICD-10-CM | POA: Diagnosis not present

## 2023-05-08 DIAGNOSIS — H938X1 Other specified disorders of right ear: Secondary | ICD-10-CM

## 2023-05-08 DIAGNOSIS — J3089 Other allergic rhinitis: Secondary | ICD-10-CM

## 2023-05-08 DIAGNOSIS — R053 Chronic cough: Secondary | ICD-10-CM | POA: Diagnosis not present

## 2023-05-08 DIAGNOSIS — K219 Gastro-esophageal reflux disease without esophagitis: Secondary | ICD-10-CM | POA: Diagnosis not present

## 2023-05-08 DIAGNOSIS — H6991 Unspecified Eustachian tube disorder, right ear: Secondary | ICD-10-CM | POA: Diagnosis not present

## 2023-05-08 DIAGNOSIS — H903 Sensorineural hearing loss, bilateral: Secondary | ICD-10-CM

## 2023-05-08 DIAGNOSIS — R0981 Nasal congestion: Secondary | ICD-10-CM

## 2023-05-08 DIAGNOSIS — K449 Diaphragmatic hernia without obstruction or gangrene: Secondary | ICD-10-CM | POA: Diagnosis not present

## 2023-05-08 DIAGNOSIS — R590 Localized enlarged lymph nodes: Secondary | ICD-10-CM

## 2023-05-08 NOTE — Addendum Note (Signed)
Addended by: Ashok Croon on: 05/08/2023 02:00 PM   Modules accepted: Level of Service

## 2023-05-08 NOTE — Patient Instructions (Addendum)
- continue Allegra and Flonase - continue reflux medication and try supplement reflux gourmet - discuss allergy testing with Allergy  - return 6 months MUCUS MANAGEMENT  Several factors can cause the sensation of increased mucus in the throat, including dryness, acid reflux, and increased mucus production from allergies or chronic sinus drainage.  There is also some evidence that added sugars or processed sugars in the diet (not the kind that occur naturally in honey or ripe fruit) can increase mucus, as well as too much dairy or refined carbohydrates. To avoid these refined carbohydrates, on food labels, watch out for "wheat flour" (also called "white," "refined" or "enriched" flour) on the ingredients list. The below website has several good options for mucus management.  leedsportal.com.pdf  Some recommendations: -Water, water, water -Cut back on caffeine -Steam treatments during the day (Personal QUALCOMM are available at [employer] and drugstores. Amazon sells one called Vick's steam inhaler that people like. A cheaper alternative is a bowl of warm water with a towel over your head. You can breathe in the steam for a couple of minutes, especially if you are about to use your voice a lot, or when you are feeling particularly dry in your throat or mouth.) -Humidifier at night. Put this right next to your head so that the mist falls on your face. Do be aware that indoor dampness can promote bacteria, mold, and dust mite growth, so if you have a dust mite allergy you should be sure to use mattress and pillow covers and avoid excessive humidification. -Prevention of acid reflux by avoiding late-night eating (nothing 3 hours before laying down at night), greasy and spicy foods, and acidic foods -Mucinex (over-the-counter, generic name guaifenesin. Buy the kind without a cough suppressant or decongestant added). This medicine doesn't work well if you are  not well hydrated, so drink plenty of water on days when you take it. -Xylimelts -Nasal saline spray such as Simply Saline (or any nasal saline without preservatives, in 0.9% concentration) -Nasal saline irrigations with NeilMed rinse kit, Neti Pot, Active Sinus, or Nasopure irrigation bottles (available in any pharmacy or grocery store, and all available on [employer]) -Avoid the things you are allergic to if you have allergies (use dust mite covers on your bed, wash your hair at night instead of morning if you have pollen allergies) - see this website for more information on managing allergies if this is a problem for you: www.sinusallergy.http://hicks.info/ -Hall's Breezers (sugar-free), Grether's black currant pastilles, and Entertainer's Secret Throat Relief can all help dry mouth and thickened mucous. See website above for details. -If you smoke, stop. -Low-processed-sugar diet. Processed sugars may increase mucus and even generalized inflammation in the body. The FDA is changing the way they label foods soon so that it's easier to tell when sugars occur naturally in the food (which is not harmful to our health) vs when processed sugars or syrups have been added to the food (which may increase inflammation in the body).  In the meantime, you can research the "plant-based diet," "anti-inflammatory diet," "mediterranean diet," or the "paleo diet" to get an idea of foods that you might want to avoid. -Dairy - some patients find that eating a lot of dairy products worsens their mucus. -Sleep apnea - if you have sleep apnea and don't treat it, consider starting up your CPAP again (and be sure to use the humidification). Snoring and struggling to keep your airway open all night long is traumatizing to the lining of your  throat and can increase irritation.             Travel steam  inhalers/humidifiers: www.amandaflynnvoice.com http://www.amazon.com/Air-O-Swiss-7146-Travel-Ultrasonic-Humidifier/dp/B001JL4LZ4? https://www.jenkins-webster.com/  - Take Reflux Gourmet (natural supplement available on Amazon) to help with symptoms of chronic throat irritation

## 2023-05-08 NOTE — Progress Notes (Signed)
ENT Progress Note:  Update 05/08/23: she returns after neck soft tissue U/S which showed small non-enlarged lymph nodes c/w reactive lymphadenopathy. She reports sx improvement and taking allergy medications and GERD medications. Have not tried Reflux Gourmet. Neck discomfort resolved once her cough improved.   Update 03/27/23: She returns after hearing evaluation, which showed symmetric bilateral sensorineural hearing loss with excellent word recognition score. She is on Chief Strategy Officer.  She reports persistent cough with clear mucus.  She noticed some discomfort when she coughs along the right superior neck near the border of the lower jaw and superior aspect of SCM, only bothering her when she coughs forcefully.  She has been taking reflux medications.  Continues to have some ear pressure and muffled hearing on the right from time to time.   Audiogram results  Tympanometry results were consistent with normal middle ear function, bilaterally   Audiometric testing was completed using conventional audiometry with supraural transducer. Speech Recognition Thresholds were 15dB in the right ear and 10dB in the left ear. Word Recognition was performed 40dB SL, scored 100% in the right ear and 100% in the left ear. Pure tone thresholds show normal hearing 250-6kHz sloping to a mild hearing loss 8kHz only bilaterally.   Initial Evaluation 12/25/2022:  Reason for Consult: chronic cough    Referring Physician:  Ames Dura, NP [facility]  HPI: [name] is an 77 y.o. female with hx HTN, T2DM, OSA (stopped using CPAP 2/2 poor tolerance), BMI of 37, nocturnal hypoxemia, on O2 at night, GERD, hiatal hernia, chronic cough for over a year, is here for initial evaluation with [place]. She reports post-nasal drip and congestion, had positive RAST. She had negative CXR 05/2022.  She was seen by Cards 11/14/22 and is f/b them, near normal EF when underwent testing.   She is on  Vansartan-hydrochlorothiazide combo medication. She is on Pantoprazole 40 mg am and Famotidine 20 mg for GERD.   Cough is productive with clear mucus, coughs at night and during the day.   She is on Flonase BID. She is on Albuterol. She has dyspnea on exertion and was given albuterol TID daily - it helps with dyspnea. She is on OTC Allegra. Both did not help with cough. She saw GI several years ago and was diagnosed with small hiatal hernia based on EGD.  Never smoked. Denies hx of lung disease.   Records Reviewed:  Pending CT chest w/o contrast and CT Max/Face and scheduled for 12/30/2022  Note by Angelica Dura, NP 12/10/2022 "77 year old female, never smoked.  Past medical history significant for cough variant asthma, chronic rhinitis.  Patient of Dr. Sherene Sires, last seen in office on 08/14/2022.   Onset worsening of chronic rhinitis spring 2020 follow by onset of chronic cough  - [facility] w/u  11/29/21 :   strongly Pos IgE 559 multiple pos RAST  dust mite, grass pollen, cockroach, molds, tree pollen and weed pollen    but skin testing neg > no better on allegra/ singulair, atrovent nasa spray - 06/05/2022   Walked on RA  x  3  lap(s) =  approx 750  ft  @ fast pace, stopped due to end of study  with lowest 02 sats 98% s sob /cough /cp   - FENO 06/05/2022  = 11      On Atrovent/Albuterol and PPI (Protonix 40 mg daily) On Azelastine  Chronic cough for >2 years  No better on Allegra/Singulair or Atrovent CXR in December was clear  Needs CT imaging of chest and sinuses Referring to ENT   ALLERGIC RHINITIS CAUSE UNSPECIFIED - Continue over-the-counter antihistamine and Atrovent nasal spray - Strongly positive allergy testing in June 2023/ FENO normal 06/05/22 - May want to discuss allergy shots/biologics at follow-up      12/10/2022- interim hx  Presents today for acute office visit. Patient of Dr. Sherene Sires, last seen in February 2024 for chronic rhinitis and chronic cough. Not felt to be true  asthma.  Allergy testing positive in June 2023.  Recommended Max GERD treatment + h1 blocker pepcid 20mg  at bedtime / mucinex dm - refer to [facility] voice center if not improved with above.   She had a flare up three weeks ago where she reports having increased sob and productive cough. Associated sneezing and running nose. Symptoms have eased up. Cough is her biggest complaint. Cough is productive with clear mucus. She is taking OTC antihistamine and Atrovent nasal spray as directed. Reports no significant improvement from Singulair or when taking prednisone. She uses albuterol three times a day. Needs refill of Albuterol, nasal spray and protonix.       Past Medical History:  Diagnosis Date   Arthritis    hands   Bladder infection    Carpal tunnel syndrome    Complication of anesthesia    Difficult to arouse   Diabetes mellitus    GERD (gastroesophageal reflux disease)    Hypertension    Sleep apnea     Past Surgical History:  Procedure Laterality Date   ABDOMINAL HYSTERECTOMY  1985   partial   APPENDECTOMY     age 82   CARPAL TUNNEL RELEASE Bilateral 1980   cataract surgery Right 07/07/2020   Dr. Dione Booze   cataract surgery Left 12/2020   HERNIA REPAIR  1990   TONSILLECTOMY     as a child   TOTAL KNEE ARTHROPLASTY Right 04/19/2019   Procedure: RIGHT TOTAL KNEE ARTHROPLASTY;  Surgeon: Gean Birchwood, MD;  Location: [facility];  Service: Orthopedics;  Laterality: Right;   TOTAL KNEE ARTHROPLASTY Left 05/21/2021   Procedure: LEFT TOTAL KNEE ARTHROPLASTY;  Surgeon: Gean Birchwood, MD;  Location: [facility];  Service: Orthopedics;  Laterality: Left;    Family History  Problem Relation Age of Onset   Diabetes Mother    Hypertension Mother    Asthma Father    Heart attack Father 25   Stroke Father 49   Diabetes Father    Breast cancer Sister 25   Allergic rhinitis Brother    Diabetes Brother    Diabetes Brother    Atopy Paternal Grandmother     Social History:  reports that she has  never smoked. She has never been exposed to tobacco smoke. She has never used smokeless tobacco. She reports that she does not drink alcohol and does not use drugs.  Allergies:  Allergies  Allergen Reactions   Pravastatin Hives   Haemophilus Influenzae Vaccines     Nausea, pain, body weakness   Sulfa Antibiotics Itching   Sulfonamide Derivatives Itching   Tetanus Toxoid, Adsorbed Other (See Comments)   Tetanus Toxoids Swelling    Arm swelled at injection site    Medications: I have reviewed the patient's current medications.  No results found for this or any previous visit (from the past 48 hour(s)).  The PMH, PSH, Medications, Allergies, and SH were reviewed and updated.  ROS: Constitutional: Negative for fever, weight loss and weight gain. Cardiovascular: Negative for chest pain and dyspnea on exertion. Respiratory:  Is not experiencing shortness of breath at rest. (+) chronic cough  Gastrointestinal: Negative for nausea and vomiting. Neurological: Negative for headaches. Psychiatric: The patient is not nervous/anxiou  Blood pressure 137/74, pulse 79, SpO2 97%.  PHYSICAL EXAM:  Exam: General: Well-developed, well-nourished Respiratory Respiratory effort: Equal inspiration and expiration without stridor Cardiovascular Peripheral Vascular: Warm extremities with equal color/perfusion Eyes: No nystagmus with equal extraocular motion bilaterally Neuro/Psych/Balance: Patient oriented to person, place, and time; Appropriate mood and affect; Gait is intact with no imbalance; Cranial nerves I-XII are intact Head and Face Inspection: Normocephalic and atraumatic without mass or lesion Palpation: Facial skeleton intact without bony stepoffs Salivary Glands: No mass or tenderness Facial Strength: Facial motility symmetric and full bilaterally ENT Pinna: External ear intact and fully developed External canal: Canal is patent with intact skin Tympanic Membrane: Clear and  mobile External Nose: No scar or anatomic deformity Lips, Teeth, and gums: Mucosa and teeth intact and viable TMJ: No pain to palpation with full mobility Oral cavity/oropharynx: No erythema or exudate, no lesions present Neck Neck and Trachea: Midline trachea without mass or lesion Thyroid: No mass or nodularity Lymphatics: No lymphadenopathy   Studies Reviewed: CXR - negative (done 05/2022)  CT sinuses done 12/30/22 FINDINGS: Paranasal sinuses:   Frontal: Normally aerated. Patent frontal sinus drainage pathways.   Ethmoid: Normally aerated.   Maxillary: Normally aerated. There is a polyp or mucous retention cysts in the inferior left maxillary sinus measuring 13 x 8 by 9 mm.   Sphenoid: Normally aerated. Patent sphenoethmoidal recesses.   Right ostiomeatal unit: Patent.   Left ostiomeatal unit: Patent.   Nasal passages: Patent. Intact nasal septum is midline.   Other: Orbits and intracranial compartment are unremarkable. Visible mastoid air cells are normally aerated.   IMPRESSION: 1. Normally aerated paranasal sinuses. Patent sinus drainage pathways. 2. Small polyp or mucous retention cyst in the inferior left maxillary sinus measuring 13 x 8 x 9 mm.  12/30/22 CT Chest  IMPRESSION: 1. No acute abnormality. 2. Mild linear atelectasis/scarring at both lung bases. 3. 4 mm nonobstructing right renal calculus. 4.  Calcific coronary artery and aortic atherosclerosis.  Assessment/Plan: Encounter Diagnoses  Name Primary?   Post-nasal drip Yes   Gastroesophageal reflux disease without esophagitis    Hiatal hernia with GERD    Nasal obstruction    Chronic cough    Nasal congestion    Environmental and seasonal allergies    Hypertrophy of both inferior nasal turbinates    Cervical lymphadenopathy    Dysfunction of both eustachian tubes     77 y.o. female with hx HTN, T2DM, OSA (stopped using CPAP 2/2 poor tolerance), BMI of 37, nocturnal hypoxemia, on O2 at night,  GERD, hiatal hernia, chronic cough for over a year, is here for initial evaluation with [place]. She reports post-nasal drip and congestion, had positive RAST. She had negative CXR 05/2022, and f/b Pulm. Exam including flexible laryngoscopy without lesions or masses, mild post-cricoid edema c/w GERD and nasal mucosal edema/ITH c/w allergies/PND.   - I discussed common etiologies of chronic cough including GERD/allergies and PND and lung disease with the patient and explained that in most cases the cause of chronic cough is multi-factorial - we discussed that she needs to ask her PCP about an alternative to hydrochlorothiazide for HTN since this could be causing her cough sx - start Azelastine and continue Flonase  - continue Allegra or change to a different antihistamine - start Singulair in addition to antihistamine - start  alginates ([employer] supplement) and continue two other reflux medications for now - will try to wean if cough improves if she tolerates - return in 3 months for sx check and imaging review  - will consider dx of neurogenic cough in the future (will offer medical management or SLN Block) - she endorses decreased hearing on the right side, gradually worse over time - will order screening Audiogram, normal ear exam today (likely ETD vs age related hearing loss)  Update 03/27/23 She returns after hearing evaluation, which showed symmetric bilateral sensorineural hearing loss with excellent word recognition score. She is on Chief Strategy Officer.  She reports persistent cough with clear mucus.  She noticed some discomfort when she coughs along the right superior neck near the border of the lower jaw and superior aspect of SCM, only bothering her when she coughs forcefully.  She has been taking reflux medications.  Continues to have some ear pressure and muffled hearing on the right from time to time. Pain along the right SCM/neck when coughing with palpable lymph node on exam today  -  Soft tissue neck U/S to see if reactive lymphadenopathy - will consider CT neck in the future if symptoms persist and soft tissue ultrasound findings are concerning to warrant additional imaging -I also discussed with the patient that the pain discomfort could be musculoskeletal or related to cervical spine  2. Right ear pressure, muffled hearing -likely related to eustachian tube dysfunction no masses in the nasopharynx during the flexible scope exam on initial evaluation and audiogram with mild symmetric bilateral sensorineural hearing loss in high frequencies consistent with age-related hearing loss -Continue with Allegra 180 mg daily and azelastine 2 puffs bilateral nares twice daily -Continue Singulair 10 mg 3. GERD/LPR hx of hiatal hernia -Continue Pepcid 20 mg daily and Protonix 40 mg daily 4.  Chronic cough She reports post-nasal drip and congestion, had positive RAST. She had negative CXR 05/2022. Exam including flexible laryngoscopy without lesions or masses, mild post-cricoid edema c/w GERD and nasal mucosal edema/ITH c/w allergies/PND. CT chest and sinus CT done 12/30/22 which I reviewed, and revealed small pulmonary nodule and minimal atelectasis/scarring, and well-aerated paranasal sinuses with small mucus retention cyst in the left maxillary sinuses.   -discussed common etiologies of chronic cough including GERD/allergies and PND and lung disease with the patient and explained that in most cases the cause of chronic cough is multi-factorial - we discussed that she needs to ask her PCP about an alternative to hydrochlorothiazide for HTN since this could be causing her cough sx -Medical management of postnasal drainage with Allegra Singulair and nasal sprays including Flonase and azelastine - start alginates ([employer] supplement) and continue two other reflux medications for now - will try to wean if cough improves if she tolerates - will consider dx of neurogenic cough in the future (will  offer medical management or SLN Block)  RTC in a few weeks for sx check and imaging review   Update 05/08/23  Neck U/S done 04/04/23 - reviewed with pt IMPRESSION: Sonographic evaluation of the right neck demonstrates numerous nonenlarged lymph nodes, which are most likely inflammatory. If the patient's symptoms persist or worsen, further evaluation with contrast enhanced soft tissue neck CT should be considered.  Her sx are better 75% - cough is not as bad and she is coughing up less mucus. Her ear pressure is better. She is on Allegra and Flonase and did not start Reflux Gourmet, on reflux medication.    Pain along  the right SCM/neck when coughing - improved after cough got better - Soft tissue neck U/S with reactive lymphadenopathy  - exam without palpable nodes today - if sx recur will consider repeat neck U/S in 6 months  - she will return in 6 months    2. Right ear pressure, muffled hearing -likely related to eustachian tube dysfunction no masses in the nasopharynx during the flexible scope exam on initial evaluation and audiogram with mild symmetric bilateral sensorineural hearing loss in high frequencies consistent with age-related hearing loss -Continue with Allegra 180 mg daily and azelastine 2 puffs bilateral nares twice daily -Continue Singulair 10 mg  3. GERD/LPR hx of hiatal hernia -Continue Pepcid 20 mg daily and Protonix 40 mg daily - trial of Reflux Gourmet  4.  Chronic cough She reports post-nasal drip and congestion, had positive RAST. She had negative CXR 05/2022. Exam including flexible laryngoscopy without lesions or masses, mild post-cricoid edema c/w GERD and nasal mucosal edema/ITH c/w allergies/PND. CT chest and sinus CT done 12/30/22 which I reviewed, and revealed small pulmonary nodule and minimal atelectasis/scarring, and well-aerated paranasal sinuses with small mucus retention cyst in the left maxillary sinuses.   -discussed common etiologies of chronic  cough including GERD/allergies and PND and lung disease with the patient and explained that in most cases the cause of chronic cough is multi-factorial - we discussed that she needs to ask her PCP about an alternative to hydrochlorothiazide for HTN since this could be causing her cough sx -Medical management of postnasal drainage with Allegra Singulair and nasal sprays including Flonase and azelastine - start alginates ([employer] supplement) and continue two other reflux medications for now - will try to wean if cough improves if she tolerates  She will return in 6 months    Ashok Croon, MD Otolaryngology [facility] Phone: [phone] Fax: [phone]   05/08/2023, 1:57 PM

## 2023-05-13 ENCOUNTER — Telehealth: Payer: Self-pay | Admitting: Internal Medicine

## 2023-05-13 DIAGNOSIS — J3089 Other allergic rhinitis: Secondary | ICD-10-CM

## 2023-05-13 DIAGNOSIS — R053 Chronic cough: Secondary | ICD-10-CM

## 2023-05-13 NOTE — Telephone Encounter (Signed)
Patient has a CT scan and appointment scheduled for November 26, less than an hour apart. She was wondering if the CT scan was needed since she has already had an ultrasound last month (October 11) for the same issue. Please call and advise. [phone] or [phone]

## 2023-05-14 NOTE — Telephone Encounter (Signed)
Yes.   [id number] CT MAXLLFCL W/O CONTRAST

## 2023-05-20 ENCOUNTER — Ambulatory Visit: Payer: Medicare HMO | Admitting: Internal Medicine

## 2023-05-20 ENCOUNTER — Ambulatory Visit (HOSPITAL_COMMUNITY)
Admission: RE | Admit: 2023-05-20 | Discharge: 2023-05-20 | Disposition: A | Discharge status: Home / Self Care | Payer: Medicare HMO | Source: Ambulatory Visit | Attending: Primary Care | Admitting: Primary Care

## 2023-05-20 DIAGNOSIS — R053 Chronic cough: Secondary | ICD-10-CM | POA: Insufficient documentation

## 2023-05-20 DIAGNOSIS — E041 Nontoxic single thyroid nodule: Secondary | ICD-10-CM | POA: Diagnosis not present

## 2023-05-20 DIAGNOSIS — J3089 Other allergic rhinitis: Secondary | ICD-10-CM | POA: Diagnosis not present

## 2023-05-20 NOTE — Telephone Encounter (Signed)
Pt is aware of below message and voiced her understanding. She will proceed with CT.  Nothing further needed.

## 2023-05-29 ENCOUNTER — Ambulatory Visit (INDEPENDENT_AMBULATORY_CARE_PROVIDER_SITE_OTHER): Payer: Medicare HMO | Admitting: Internal Medicine

## 2023-05-29 ENCOUNTER — Encounter: Payer: Self-pay | Admitting: Internal Medicine

## 2023-05-29 VITALS — BP 110/70 | HR 53 | Temp 97.7°F | Ht 63.0 in | Wt 212.0 lb

## 2023-05-29 DIAGNOSIS — I10 Essential (primary) hypertension: Secondary | ICD-10-CM | POA: Diagnosis not present

## 2023-05-29 DIAGNOSIS — E785 Hyperlipidemia, unspecified: Secondary | ICD-10-CM

## 2023-05-29 DIAGNOSIS — I700 Atherosclerosis of aorta: Secondary | ICD-10-CM

## 2023-05-29 DIAGNOSIS — E1169 Type 2 diabetes mellitus with other specified complication: Secondary | ICD-10-CM

## 2023-05-29 DIAGNOSIS — M791 Myalgia, unspecified site: Secondary | ICD-10-CM | POA: Diagnosis not present

## 2023-05-29 DIAGNOSIS — E559 Vitamin D deficiency, unspecified: Secondary | ICD-10-CM

## 2023-05-29 DIAGNOSIS — M2550 Pain in unspecified joint: Secondary | ICD-10-CM | POA: Diagnosis not present

## 2023-05-29 DIAGNOSIS — E66812 Obesity, class 2: Secondary | ICD-10-CM

## 2023-05-29 DIAGNOSIS — Z6837 Body mass index (BMI) 37.0-37.9, adult: Secondary | ICD-10-CM

## 2023-05-29 DIAGNOSIS — T7840XA Allergy, unspecified, initial encounter: Secondary | ICD-10-CM | POA: Diagnosis not present

## 2023-05-29 DIAGNOSIS — Z2821 Immunization not carried out because of patient refusal: Secondary | ICD-10-CM

## 2023-05-29 DIAGNOSIS — L304 Erythema intertrigo: Secondary | ICD-10-CM

## 2023-05-29 DIAGNOSIS — Z79899 Other long term (current) drug therapy: Secondary | ICD-10-CM

## 2023-05-29 DIAGNOSIS — E2839 Other primary ovarian failure: Secondary | ICD-10-CM

## 2023-05-29 NOTE — Assessment & Plan Note (Signed)
Chronic, well controlled.  She will continue with valsartan/hct 80/12.5mg  daily along with carvedilol 3.125mg  twice daily. She is encouraged to follow a low sodium diet.

## 2023-05-29 NOTE — Patient Instructions (Signed)
Hypertension, Adult Hypertension is another name for high blood pressure. High blood pressure forces your heart to work harder to pump blood. This can cause problems over time. There are two numbers in a blood pressure reading. There is a top number (systolic) over a bottom number (diastolic). It is best to have a blood pressure that is below 120/80. What are the causes? The cause of this condition is not known. Some other conditions can lead to high blood pressure. What increases the risk? Some lifestyle factors can make you more likely to develop high blood pressure: Smoking. Not getting enough exercise or physical activity. Being overweight. Having too much fat, sugar, calories, or salt (sodium) in your diet. Drinking too much alcohol. Other risk factors include: Having any of these conditions: Heart disease. Diabetes. High cholesterol. Kidney disease. Obstructive sleep apnea. Having a family history of high blood pressure and high cholesterol. Age. The risk increases with age. Stress. What are the signs or symptoms? High blood pressure may not cause symptoms. Very high blood pressure (hypertensive crisis) may cause: Headache. Fast or uneven heartbeats (palpitations). Shortness of breath. Nosebleed. Vomiting or feeling like you may vomit (nauseous). Changes in how you see. Very bad chest pain. Feeling dizzy. Seizures. How is this treated? This condition is treated by making healthy lifestyle changes, such as: Eating healthy foods. Exercising more. Drinking less alcohol. Your doctor may prescribe medicine if lifestyle changes do not help enough and if: Your top number is above 130. Your bottom number is above 80. Your personal target blood pressure may vary. Follow these instructions at home: Eating and drinking  If told, follow the DASH eating plan. To follow this plan: Fill one half of your plate at each meal with fruits and vegetables. Fill one fourth of your plate  at each meal with whole grains. Whole grains include whole-wheat pasta, brown rice, and whole-grain bread. Eat or drink low-fat dairy products, such as skim milk or low-fat yogurt. Fill one fourth of your plate at each meal with low-fat (lean) proteins. Low-fat proteins include fish, chicken without skin, eggs, beans, and tofu. Avoid fatty meat, cured and processed meat, or chicken with skin. Avoid pre-made or processed food. Limit the amount of salt in your diet to less than 1,500 mg each day. Do not drink alcohol if: Your doctor tells you not to drink. You are pregnant, may be pregnant, or are planning to become pregnant. If you drink alcohol: Limit how much you have to: 0-1 drink a day for women. 0-2 drinks a day for men. Know how much alcohol is in your drink. In the [place] one drink equals one 12 oz bottle of beer (355 mL), one 5 oz glass of wine (148 mL), or one 1 oz glass of hard liquor (44 mL). Lifestyle  Work with your doctor to stay at a healthy weight or to lose weight. Ask your doctor what the best weight is for you. Get at least 30 minutes of exercise that causes your heart to beat faster (aerobic exercise) most days of the week. This may include walking, swimming, or biking. Get at least 30 minutes of exercise that strengthens your muscles (resistance exercise) at least 3 days a week. This may include lifting weights or doing Pilates. Do not smoke or use any products that contain nicotine or tobacco. If you need help quitting, ask your doctor. Check your blood pressure at home as told by your doctor. Keep all follow-up visits. Medicines Take over-the-counter and prescription medicines   only as told by your doctor. Follow directions carefully. Do not skip doses of blood pressure medicine. The medicine does not work as well if you skip doses. Skipping doses also puts you at risk for problems. Ask your doctor about side effects or reactions to medicines that you should watch  for. Contact a doctor if: You think you are having a reaction to the medicine you are taking. You have headaches that keep coming back. You feel dizzy. You have swelling in your ankles. You have trouble with your vision. Get help right away if: You get a very bad headache. You start to feel mixed up (confused). You feel weak or numb. You feel faint. You have very bad pain in your: Chest. Belly (abdomen). You vomit more than once. You have trouble breathing. These symptoms may be an emergency. Get help right away. Call 911. Do not wait to see if the symptoms will go away. Do not drive yourself to the hospital. Summary Hypertension is another name for high blood pressure. High blood pressure forces your heart to work harder to pump blood. For most people, a normal blood pressure is less than 120/80. Making healthy choices can help lower blood pressure. If your blood pressure does not get lower with healthy choices, you may need to take medicine. This information is not intended to replace advice given to you by your health care provider. Make sure you discuss any questions you have with your health care provider. Document Revised: 03/29/2021 Document Reviewed: 03/29/2021 Elsevier Patient Education  2024 [employer]  

## 2023-05-29 NOTE — Assessment & Plan Note (Signed)
She is encouraged to strive for BMI<30 to decrease cardiac risk. Advised to aim for at least 150 minutes of exercise per week.

## 2023-05-29 NOTE — Assessment & Plan Note (Signed)
Chronic, LDL goal < 70.  She will continue with rosuvastatin daily and metformin twice daily. She will f/u in four months for re-evaluation.

## 2023-05-29 NOTE — Assessment & Plan Note (Signed)
Chronic, LDL goal < 70.  She will c/w rosuvastatin 20mg  daily. She is encouraged to follow a heart healthy lifestyle.

## 2023-05-29 NOTE — Progress Notes (Signed)
I,[name], CMA,acting as a Neurosurgeon for [name], MD.,have documented all relevant documentation on the behalf of [name], MD,as directed by  [name], MD while in the presence of [name], MD.  Subjective:  Patient ID: [name] , female    DOB: January 04, 1946 , 77 y.o.   MRN: [id number]  Chief Complaint  Patient presents with   Hypertension   Diabetes    HPI  The patient is here today for a diabetes and blood pressure f/u. She reports compliance with meds. She denies chest pain, shortness of breath and headaches. She denies having any specific questions or concerns.    She also reports after stopping the rosuvastatin she had an outbreak on her back. She admits it became intense enough to the point where she had to use her Epipen. On her back the spots are drying up.   Hypertension This is a chronic problem. The current episode started more than 1 year ago. The problem has been gradually improving since onset. The problem is controlled. Pertinent negatives include no blurred vision, chest pain, palpitations or shortness of breath. Risk factors for coronary artery disease include diabetes mellitus, dyslipidemia, obesity, post-menopausal state and sedentary lifestyle. Past treatments include angiotensin blockers and diuretics.  Diabetes She presents for her follow-up diabetic visit. She has type 2 diabetes mellitus. Her disease course has been stable. There are no hypoglycemic associated symptoms. Pertinent negatives for diabetes include no blurred vision and no chest pain. There are no hypoglycemic complications. Risk factors for coronary artery disease include dyslipidemia, diabetes mellitus, hypertension, obesity, post-menopausal and sedentary lifestyle. She is following a generally healthy diet. Her home blood glucose trend is fluctuating minimally. Her breakfast blood glucose is taken between 8-9 am. Her breakfast blood glucose range is generally  110-130 mg/dl. An ACE inhibitor/angiotensin II receptor blocker is being taken. Eye exam is current.     Past Medical History:  Diagnosis Date   Arthritis    hands   Bladder infection    Carpal tunnel syndrome    Complication of anesthesia    Difficult to arouse   Diabetes mellitus    GERD (gastroesophageal reflux disease)    Hypertension    Sleep apnea      Family History  Problem Relation Age of Onset   Diabetes Mother    Hypertension Mother    Asthma Father    Heart attack Father 6   Stroke Father 59   Diabetes Father    Breast cancer Sister 64   Allergic rhinitis Brother    Diabetes Brother    Diabetes Brother    Atopy Paternal Grandmother      Current Outpatient Medications:    albuterol (VENTOLIN HFA) 108 (90 Base) MCG/ACT inhaler, Inhale 2 puffs into the lungs every 6 (six) hours as needed for wheezing or shortness of breath., Disp: 18 g, Rfl: 2   Ascorbic Acid (VITAMIN C) 1000 MG tablet, Take 1,000 mg by mouth daily., Disp: , Rfl:    aspirin EC 81 MG tablet, Take 1 tablet (81 mg total) by mouth 2 (two) times daily., Disp: 60 tablet, Rfl: 0   Azelastine HCl 0.15 % SOLN, Place 2 sprays into the nose daily., Disp: 30 mL, Rfl: 3   Bacillus Coagulans-Inulin (PROBIOTIC FORMULA PO), Take 1 capsule by mouth 2 (two) times a week., Disp: , Rfl:    carvedilol (COREG) 3.125 MG tablet, Take 1 tablet (3.125 mg total) by mouth 2 (two) times  daily., Disp: 180 tablet, Rfl: 1   Cholecalciferol (VITAMIN D) 50 MCG (2000 UT) tablet, Take 2,000 Units by mouth daily., Disp: , Rfl:    clotrimazole-betamethasone (LOTRISONE) cream, Apply topically 2 (two) times daily as needed. (Patient taking differently: Apply 1 Application topically 2 (two) times daily as needed (irritation).), Disp: 30 g, Rfl: 1   Coenzyme Q10 (COQ10) 100 MG CAPS, Take 100 mg by mouth daily with lunch. , Disp: , Rfl:    EPINEPHrine 0.3 mg/0.3 mL IJ SOAJ injection, Inject 0.3 mg into the muscle as needed for  anaphylaxis., Disp: 1 each, Rfl: 2   estradiol (ESTRACE) 0.1 MG/GM vaginal cream, Place 1 Applicatorful vaginally daily as needed (dryness)., Disp: , Rfl:    famotidine (PEPCID) 20 MG tablet, One after supper, Disp: 90 tablet, Rfl: 3   fluticasone (FLONASE) 50 MCG/ACT nasal spray, Place 1 spray into both nostrils daily as needed for allergies or rhinitis., Disp: , Rfl:    furosemide (LASIX) 20 MG tablet, Take 1 tablet (20 mg total) by mouth daily as needed (fluid)., Disp: 90 tablet, Rfl: 1   metFORMIN (GLUCOPHAGE) 500 MG tablet, TAKE 1 TABLET BY MOUTH TWICE DAILY WITH A MEAL, Disp: 180 tablet, Rfl: 0   montelukast (SINGULAIR) 10 MG tablet, Take 1 tablet (10 mg total) by mouth at bedtime., Disp: 30 tablet, Rfl: 3   nystatin (MYCOSTATIN/NYSTOP) powder, Apply 1 Application topically 2 (two) times daily. To affected area(s), Disp: 60 g, Rfl: 1   nystatin cream (MYCOSTATIN), Apply 1 Application topically 2 (two) times daily. prn, Disp: 30 g, Rfl: 0   OneTouch Delica Lancets 33G MISC, USE   TO CHECK GLUCOSE BEFORE BREAKFAST AND  DINNER, Disp: 100 each, Rfl: 0   ONETOUCH VERIO test strip, USE 1 STRIP TO CHECK GLUCOSE TWICE DAILY, Disp: 100 each, Rfl: 0   OXYGEN, Inhale 1 L into the lungs at bedtime as needed (oxygen)., Disp: , Rfl:    pantoprazole (PROTONIX) 40 MG tablet, Take 1 tablet (40 mg total) by mouth daily., Disp: 30 tablet, Rfl: 5   potassium chloride SA (KLOR-CON M) 20 MEQ tablet, One tab po every day prn, with each dose of furosemide., Disp: 90 tablet, Rfl: 1   rosuvastatin (CRESTOR) 20 MG tablet, Take 20 mg by mouth daily., Disp: , Rfl:    valsartan-hydrochlorothiazide (DIOVAN-HCT) 80-12.5 MG tablet, Take 1 tablet by mouth daily., Disp: , Rfl:    ezetimibe (ZETIA) 10 MG tablet, Take 1 tablet (10 mg total) by mouth daily. (Patient not taking: Reported on 05/08/2023), Disp: 30 tablet, Rfl: 11   Allergies  Allergen Reactions   Pravastatin Hives   Haemophilus Influenzae Vaccines     Nausea,  pain, body weakness   Sulfa Antibiotics Itching   Sulfonamide Derivatives Itching   Tetanus Toxoid, Adsorbed Other (See Comments)   Tetanus Toxoids Swelling    Arm swelled at injection site     Review of Systems  Constitutional: Negative.   Eyes:  Negative for blurred vision.  Respiratory: Negative.  Negative for shortness of breath.   Cardiovascular: Negative.  Negative for chest pain and palpitations.  Gastrointestinal: Negative.   Musculoskeletal:  Positive for arthralgias and myalgias.       She reports experiencing headache & muscle aches from taking the Rosuvastatin. She called the office to report the issue & she was told to hold medicine for a week. She admits after holding off of the medicine her headache & muscle aches stopped. She has not resumed medicine.  She also c/o worsening joint pains. She also c/o joint stiffness.   Skin:  Positive for rash.       She also reports after stopping the rosuvastatin she had an outbreak on her back. She admits it became intense enough to the point where she had to use her Epipen. On her back the spots are drying up.   Neurological: Negative.   Psychiatric/Behavioral: Negative.       Today's Vitals   05/29/23 1148  BP: 110/70  Pulse: (!) 53  Temp: 97.7 F (36.5 C)  SpO2: 98%  Weight: 212 lb (96.2 kg)  Height: 5\' 3"  (1.6 m)  PainSc: 0-No pain   Body mass index is 37.55 kg/m.  Wt Readings from Last 3 Encounters:  05/29/23 212 lb (96.2 kg)  01/22/23 210 lb 3.2 oz (95.3 kg)  12/25/22 213 lb (96.6 kg)     Objective:  Physical Exam Vitals and nursing note reviewed.  Constitutional:      Appearance: Normal appearance. She is obese.  HENT:     Head: Normocephalic and atraumatic.  Eyes:     Extraocular Movements: Extraocular movements intact.  Cardiovascular:     Rate and Rhythm: Normal rate and regular rhythm.     Heart sounds: Normal heart sounds.  Pulmonary:     Effort: Pulmonary effort is normal.     Breath sounds:  Normal breath sounds.  Musculoskeletal:     Cervical back: Normal range of motion.     Comments: Neg squeeze test  Skin:    General: Skin is warm.     Comments: Scattered hyperpigmented papules on upper back. No overlying or surrounding erythema. No vesicular lesions noted.   Neurological:     General: No focal deficit present.     Mental Status: She is alert.  Psychiatric:        Mood and Affect: Mood normal.        Behavior: Behavior normal.         Assessment And Plan:  Essential hypertension, benign Assessment & Plan: Chronic, well controlled.  She will continue with valsartan/hct 80/12.5mg  daily along with carvedilol 3.125mg  twice daily. She is encouraged to follow a low sodium diet.    Abdominal aortic atherosclerosis (HCC) Assessment & Plan: Chronic, LDL goal < 70.  She will c/w rosuvastatin 20mg  daily. She is encouraged to follow a heart healthy lifestyle.    Dyslipidemia associated with type 2 diabetes mellitus (HCC) Assessment & Plan: Chronic, LDL goal < 70.  She will continue with rosuvastatin daily and metformin twice daily. She will f/u in four months for re-evaluation.   Orders: -     CMP14+EGFR -     Hemoglobin A1c  Myalgia Assessment & Plan: Sx resolved after stopping rosuvastatin. Consider Lipid Clinic evaluation to discuss other treatment options.   Orders: -     CK  Allergic reaction, initial encounter Assessment & Plan: She states this occurred two weeks ago, she is not sure what caused it. It occurred after stopping rosuvastatin.  She states rash was so bad she had to use her EpiPen.  She did not have any shortness of breath or tongue swelling.  She is advised to consider Zyrtec as needed. She is encouraged to let me know if her sx recur.     Arthralgia, unspecified joint -     ANA, IFA (with reflex) -     CYCLIC CITRUL PEPTIDE ANTIBODY, IGG/IGA -     Rheumatoid factor -  Sedimentation rate -     Uric acid  Estrogen deficiency Assessment  & Plan: She is due for bone density. I will refer her to [facility] where she gets her mammograms. She is in agreement with her treatment plan.    Vitamin D deficiency disease Assessment & Plan: I will check a vitamin D level and supplement as needed.   Orders: -     VITAMIN D 25 Hydroxy (Vit-D Deficiency, Fractures) -     Protein electrophoresis, serum  Class 2 severe obesity due to excess calories with serious comorbidity and body mass index (BMI) of 37.0 to 37.9 in adult [facility]) Assessment & Plan: She is encouraged to strive for BMI<30 to decrease cardiac risk. Advised to aim for at least 150 minutes of exercise per week.      Drug therapy -     Vitamin B12  Immunization declined  She is encouraged to strive for BMI less than 30 to decrease cardiac risk. Advised to aim for at least 150 minutes of exercise per week.    Return if symptoms worsen or fail to improve.  Patient was given opportunity to ask questions. Patient verbalized understanding of the plan and was able to repeat key elements of the plan. All questions were answered to their satisfaction.    I, [name], MD, have reviewed all documentation for this visit. The documentation on 05/29/23 for the exam, diagnosis, procedures, and orders are all accurate and complete.   IF YOU HAVE BEEN REFERRED TO A SPECIALIST, IT MAY TAKE 1-2 WEEKS TO SCHEDULE/PROCESS THE REFERRAL. IF YOU HAVE NOT HEARD FROM US/SPECIALIST IN TWO WEEKS, PLEASE GIVE [place] A CALL AT [phone].   THE PATIENT IS ENCOURAGED TO PRACTICE SOCIAL DISTANCING DUE TO THE COVID-19 PANDEMIC.

## 2023-06-01 DIAGNOSIS — E2839 Other primary ovarian failure: Secondary | ICD-10-CM | POA: Insufficient documentation

## 2023-06-01 DIAGNOSIS — T7840XA Allergy, unspecified, initial encounter: Secondary | ICD-10-CM | POA: Insufficient documentation

## 2023-06-01 DIAGNOSIS — M2550 Pain in unspecified joint: Secondary | ICD-10-CM | POA: Insufficient documentation

## 2023-06-01 DIAGNOSIS — M791 Myalgia, unspecified site: Secondary | ICD-10-CM | POA: Insufficient documentation

## 2023-06-01 NOTE — Assessment & Plan Note (Signed)
I will check a vitamin D level and supplement as needed.

## 2023-06-01 NOTE — Assessment & Plan Note (Signed)
She states this occurred two weeks ago, she is not sure what caused it. It occurred after stopping rosuvastatin.  She states rash was so bad she had to use her EpiPen.  She did not have any shortness of breath or tongue swelling.  She is advised to consider Zyrtec as needed. She is encouraged to let me know if her sx recur.

## 2023-06-01 NOTE — Assessment & Plan Note (Signed)
Sx resolved after stopping rosuvastatin. Consider Lipid Clinic evaluation to discuss other treatment options.

## 2023-06-01 NOTE — Assessment & Plan Note (Signed)
She is due for bone density. I will refer her to [facility] where she gets her mammograms. She is in agreement with her treatment plan.

## 2023-06-04 DIAGNOSIS — I10 Essential (primary) hypertension: Secondary | ICD-10-CM | POA: Diagnosis not present

## 2023-06-04 DIAGNOSIS — R0602 Shortness of breath: Secondary | ICD-10-CM | POA: Diagnosis not present

## 2023-06-04 DIAGNOSIS — R0600 Dyspnea, unspecified: Secondary | ICD-10-CM | POA: Diagnosis not present

## 2023-06-04 DIAGNOSIS — G4733 Obstructive sleep apnea (adult) (pediatric): Secondary | ICD-10-CM | POA: Diagnosis not present

## 2023-06-05 LAB — CMP14+EGFR
ALT: 9 [IU]/L (ref 0–32)
AST: 13 [IU]/L (ref 0–40)
Albumin: 4.1 g/dL (ref 3.8–4.8)
Alkaline Phosphatase: 100 [IU]/L (ref 44–121)
BUN/Creatinine Ratio: 14 (ref 12–28)
BUN: 13 mg/dL (ref 8–27)
Bilirubin Total: 0.4 mg/dL (ref 0.0–1.2)
CO2: 25 mmol/L (ref 20–29)
Calcium: 9.4 mg/dL (ref 8.7–10.3)
Chloride: 106 mmol/L (ref 96–106)
Creatinine, Ser: 0.91 mg/dL (ref 0.57–1.00)
Globulin, Total: 2.9 g/dL (ref 1.5–4.5)
Glucose: 116 mg/dL — ABNORMAL HIGH (ref 70–99)
Potassium: 4.6 mmol/L (ref 3.5–5.2)
Sodium: 146 mmol/L — ABNORMAL HIGH (ref 134–144)
Total Protein: 7 g/dL (ref 6.0–8.5)
eGFR: 65 mL/min/{1.73_m2} (ref >59)

## 2023-06-05 LAB — PROTEIN ELECTROPHORESIS, SERUM
A/G Ratio: 1.3 (ref 0.7–1.7)
Albumin ELP: 3.9 g/dL (ref 2.9–4.4)
Alpha 1: 0.3 g/dL (ref 0.0–0.4)
Alpha 2: 0.9 g/dL (ref 0.4–1.0)
Beta: 1.1 g/dL (ref 0.7–1.3)
Gamma Globulin: 0.9 g/dL (ref 0.4–1.8)
Globulin, Total: 3.1 g/dL (ref 2.2–3.9)

## 2023-06-05 LAB — VITAMIN B12: Vitamin B-12: 534 pg/mL (ref 232–1245)

## 2023-06-05 LAB — HEMOGLOBIN A1C
Est. average glucose Bld gHb Est-mCnc: 134 mg/dL
Hgb A1c MFr Bld: 6.3 % — ABNORMAL HIGH (ref 4.8–5.6)

## 2023-06-05 LAB — SEDIMENTATION RATE: Sed Rate: 30 mm/h (ref 0–40)

## 2023-06-05 LAB — ANTINUCLEAR ANTIBODIES, IFA: ANA Titer 1: NEGATIVE

## 2023-06-05 LAB — URIC ACID: Uric Acid: 5.2 mg/dL (ref 3.1–7.9)

## 2023-06-05 LAB — CYCLIC CITRUL PEPTIDE ANTIBODY, IGG/IGA: Cyclic Citrullin Peptide Ab: 6 U (ref 0–19)

## 2023-06-05 LAB — RHEUMATOID FACTOR: Rheumatoid fact SerPl-aCnc: 11.1 [IU]/mL (ref <14.0)

## 2023-06-05 LAB — VITAMIN D 25 HYDROXY (VIT D DEFICIENCY, FRACTURES): Vit D, 25-Hydroxy: 37.6 ng/mL (ref 30.0–100.0)

## 2023-06-05 LAB — CK: Total CK: 88 U/L (ref 32–182)

## 2023-06-06 DIAGNOSIS — R102 Pelvic and perineal pain: Secondary | ICD-10-CM | POA: Diagnosis not present

## 2023-06-06 DIAGNOSIS — N952 Postmenopausal atrophic vaginitis: Secondary | ICD-10-CM | POA: Diagnosis not present

## 2023-06-06 DIAGNOSIS — N3020 Other chronic cystitis without hematuria: Secondary | ICD-10-CM | POA: Diagnosis not present

## 2023-06-06 DIAGNOSIS — N907 Vulvar cyst: Secondary | ICD-10-CM | POA: Diagnosis not present

## 2023-06-10 ENCOUNTER — Other Ambulatory Visit: Payer: Self-pay | Admitting: Internal Medicine

## 2023-06-10 DIAGNOSIS — R053 Chronic cough: Secondary | ICD-10-CM

## 2023-07-04 DIAGNOSIS — N958 Other specified menopausal and perimenopausal disorders: Secondary | ICD-10-CM | POA: Diagnosis not present

## 2023-07-04 DIAGNOSIS — E2839 Other primary ovarian failure: Secondary | ICD-10-CM | POA: Diagnosis not present

## 2023-07-04 LAB — HM DEXA SCAN: HM Dexa Scan: NORMAL

## 2023-07-05 DIAGNOSIS — I10 Essential (primary) hypertension: Secondary | ICD-10-CM | POA: Diagnosis not present

## 2023-07-05 DIAGNOSIS — G4733 Obstructive sleep apnea (adult) (pediatric): Secondary | ICD-10-CM | POA: Diagnosis not present

## 2023-07-05 DIAGNOSIS — R0602 Shortness of breath: Secondary | ICD-10-CM | POA: Diagnosis not present

## 2023-07-05 DIAGNOSIS — R0600 Dyspnea, unspecified: Secondary | ICD-10-CM | POA: Diagnosis not present

## 2023-07-08 ENCOUNTER — Encounter: Payer: Self-pay | Admitting: Internal Medicine

## 2023-07-08 ENCOUNTER — Ambulatory Visit
Admission: RE | Admit: 2023-07-08 | Discharge: 2023-07-08 | Disposition: A | Discharge status: Home / Self Care | Payer: Medicare HMO | Source: Ambulatory Visit

## 2023-07-08 VITALS — BP 134/76 | HR 62 | Temp 98.1°F | Resp 20 | Ht 63.0 in | Wt 212.1 lb

## 2023-07-08 DIAGNOSIS — R051 Acute cough: Secondary | ICD-10-CM | POA: Diagnosis not present

## 2023-07-08 DIAGNOSIS — J22 Unspecified acute lower respiratory infection: Secondary | ICD-10-CM | POA: Diagnosis not present

## 2023-07-08 DIAGNOSIS — R053 Chronic cough: Secondary | ICD-10-CM

## 2023-07-08 MED ORDER — ALBUTEROL SULFATE HFA 108 (90 BASE) MCG/ACT IN AERS: 2.0000 | INHALATION_SPRAY | Freq: Four times a day (QID) | RESPIRATORY_TRACT | 0 refills | Status: DC | PRN

## 2023-07-08 MED ORDER — PROMETHAZINE-DM 6.25-15 MG/5ML PO SYRP: 5.0000 mL | ORAL_SOLUTION | Freq: Two times a day (BID) | ORAL | 0 refills | Status: DC | PRN

## 2023-07-08 MED ORDER — DOXYCYCLINE HYCLATE 100 MG PO CAPS: 100.0000 mg | ORAL_CAPSULE | Freq: Two times a day (BID) | ORAL | 0 refills | Status: DC

## 2023-07-08 NOTE — Discharge Instructions (Addendum)
 Rest, push fluids, take antibiotic as directed, use cough medicine sparingly as it make you sleepy.  Follow-up with PCP/ENT for further evaluation.  Return as needed

## 2023-07-08 NOTE — ED Triage Notes (Signed)
"  I have been going through this for a while, using a lot of OTC medicines prior but need something done". "Starting with recurrent Sinus infections and now Cough with Ear pain". No fever.

## 2023-07-08 NOTE — ED Provider Notes (Signed)
 [facility]    CSN: [id number] Arrival date & time: 07/08/23  1321      History   Chief Complaint Chief Complaint  Patient presents with   Cough   Sinus Problems    HPI [name] is a 78 y.o. female.   78 year old female, [name], presents to urgent care for evaluation of cough, sinus congestion, ear pain x several months.   History of asthma   The history is provided by the patient. No language interpreter was used.  Cough Associated symptoms: no fever     Past Medical History:  Diagnosis Date   Arthritis    hands   Bladder infection    Carpal tunnel syndrome    Complication of anesthesia    Difficult to arouse   Diabetes mellitus    GERD (gastroesophageal reflux disease)    Hypertension    Sleep apnea     Patient Active Problem List   Diagnosis Date Noted   Myalgia 06/01/2023   Allergic reaction 06/01/2023   Arthralgia 06/01/2023   Estrogen deficiency 06/01/2023   Intertrigo 01/26/2023   Cough variant asthma with UACS 06/05/2022   Abdominal aortic atherosclerosis (HCC) 09/11/2021   Hypertensive heart disease without heart failure 09/11/2021   Uncontrolled type 2 diabetes mellitus with hyperglycemia (HCC) 09/11/2021   Vitamin D  deficiency disease 09/11/2021   Class 2 severe obesity due to excess calories with serious comorbidity and body mass index (BMI) of 37.0 to 37.9 in adult (HCC) 09/11/2021   Status post total left knee replacement 05/21/2021   Degenerative arthritis of left knee 05/16/2021   Contact dermatitis of external ear 04/07/2020   ETD (Eustachian tube dysfunction), bilateral 04/03/2020   Fungal otitis externa 04/03/2020   Nasal turbinate hypertrophy 04/03/2020   S/P TKR (total knee replacement), right 04/19/2019   Osteoarthritis of right knee 04/16/2019   Obesity, diabetes, and hypertension syndrome (HCC) 06/08/2018   Bradycardia 06/08/2018   Chronic fatigue 06/08/2018   PVC (premature ventricular  contraction) 08/03/2017   Costochondritis 08/02/2017   DOE (dyspnea on exertion) 08/01/2017   Chest pain 05/28/2016   Essential hypertension, benign 05/28/2016   Dyslipidemia associated with type 2 diabetes mellitus (HCC) 05/28/2016   Acute respiratory infection 03/22/2009   Seasonal allergic rhinitis 11/04/2008   Hyperlipidemia 04/11/2008   Cough 04/11/2008   OBSTRUCTIVE SLEEP APNEA 03/21/2008   CARPAL TUNNEL RELEASE, BILATERAL, HX OF 03/21/2008    Past Surgical History:  Procedure Laterality Date   ABDOMINAL HYSTERECTOMY  1985   partial   APPENDECTOMY     age 29   CARPAL TUNNEL RELEASE Bilateral 1980   cataract surgery Right 07/07/2020   Dr. Octavia   cataract surgery Left 12/2020   HERNIA REPAIR  1990   TONSILLECTOMY     as a child   TOTAL KNEE ARTHROPLASTY Right 04/19/2019   Procedure: RIGHT TOTAL KNEE ARTHROPLASTY;  Surgeon: Liam Lerner, MD;  Location: [facility];  Service: Orthopedics;  Laterality: Right;   TOTAL KNEE ARTHROPLASTY Left 05/21/2021   Procedure: LEFT TOTAL KNEE ARTHROPLASTY;  Surgeon: Liam Lerner, MD;  Location: [facility];  Service: Orthopedics;  Laterality: Left;    OB History   No obstetric history on file.      Home Medications    Prior to Admission medications   Medication Sig Start Date End Date Taking? Authorizing Provider  Ascorbic Acid  (VITAMIN C) 1000 MG tablet Take 1,000 mg by mouth daily.   Yes [provider]  ASPIRIN  81  PO    Yes [provider]  aspirin  EC 81 MG tablet Take 1 tablet (81 mg total) by mouth 2 (two) times daily. 05/21/21  Yes Orlando Locus K, PA-C  Azelastine  HCl 0.15 % SOLN Place 2 sprays into the nose daily. 12/25/22  Yes Soldatova, Liuba, MD  carvedilol  (COREG ) 3.125 MG tablet Take 1 tablet (3.125 mg total) by mouth 2 (two) times daily. 01/22/23  Yes Jarold Medici, MD  Coenzyme Q10 (COQ10) 100 MG CAPS Take 100 mg by mouth daily with lunch.    Yes [provider]  doxycycline  (VIBRAMYCIN ) 100 MG  capsule Take 1 capsule (100 mg total) by mouth 2 (two) times daily. 07/08/23  Yes Willow Shidler, NP  ezetimibe  (ZETIA ) 10 MG tablet Take 1 tablet (10 mg total) by mouth daily. 01/22/23 01/22/24 Yes Jarold Medici, MD  famotidine  (PEPCID ) 20 MG tablet One after supper 12/10/22  Yes Hope Almarie ORN, NP  fluticasone  (FLONASE ) 50 MCG/ACT nasal spray Place 1 spray into both nostrils daily as needed for allergies or rhinitis.   Yes [provider]  furosemide  (LASIX ) 20 MG tablet Take 1 tablet (20 mg total) by mouth daily as needed (fluid). 01/22/23  Yes Jarold Medici, MD  metFORMIN  (GLUCOPHAGE ) 500 MG tablet TAKE 1 TABLET BY MOUTH TWICE DAILY WITH A MEAL 03/03/23  Yes Jarold Medici, MD  montelukast  (SINGULAIR ) 10 MG tablet Take 1 tablet (10 mg total) by mouth at bedtime. 12/25/22  Yes Soldatova, Liuba, MD  nitrofurantoin , macrocrystal-monohydrate, (MACROBID ) 100 MG capsule Take 100 mg by mouth 2 (two) times daily. 03/24/23  Yes [provider]  OXYGEN  Inhale 1 L into the lungs at bedtime as needed (oxygen ).   Yes [provider]  pantoprazole  (PROTONIX ) 40 MG tablet Take 1 tablet (40 mg total) by mouth daily. 12/10/22  Yes Hope Almarie ORN, NP  potassium chloride  SA (KLOR-CON  M) 20 MEQ tablet One tab po every day prn, with each dose of furosemide . 01/22/23  Yes Jarold Medici, MD  promethazine -dextromethorphan (PROMETHAZINE -DM) 6.25-15 MG/5ML syrup Take 5 mLs by mouth every 12 (twelve) hours as needed for cough. 07/08/23  Yes Cornellius Kropp, NP  rosuvastatin  (CRESTOR ) 20 MG tablet Take 20 mg by mouth daily.   Yes [provider]  valsartan -hydrochlorothiazide  (DIOVAN -HCT) 80-12.5 MG tablet Take 1 tablet by mouth daily. 08/30/22  Yes [provider]  albuterol  (VENTOLIN  HFA) 108 (90 Base) MCG/ACT inhaler Inhale 2 puffs into the lungs every 6 (six) hours as needed for wheezing or shortness of breath. 07/08/23   Romar Woodrick, NP  Bacillus Coagulans-Inulin  (PROBIOTIC FORMULA PO) Take 1 capsule by mouth 2 (two) times a week.    [provider]  Cholecalciferol  (VITAMIN D ) 50 MCG (2000 UT) tablet Take 2,000 Units by mouth daily.    [provider]  clotrimazole -betamethasone  (LOTRISONE ) cream Apply topically 2 (two) times daily as needed. Patient taking differently: Apply 1 Application topically 2 (two) times daily as needed (irritation). 03/06/21   Jarold Medici, MD  EPINEPHrine  0.3 mg/0.3 mL IJ SOAJ injection Inject 0.3 mg into the muscle as needed for anaphylaxis. 11/13/22   Georgina Speaks, FNP  estradiol  (ESTRACE ) 0.1 MG/GM vaginal cream Place 1 Applicatorful vaginally daily as needed (dryness).    [provider]  nystatin  (MYCOSTATIN /NYSTOP ) powder Apply 1 Application topically 2 (two) times daily. To affected area(s) 01/22/23   Jarold Medici, MD  nystatin  cream (MYCOSTATIN ) Apply 1 Application topically 2 (two) times daily. prn 01/26/23   Jarold Medici, MD  OneTouch  Delica Lancets 33G MISC USE   TO CHECK GLUCOSE BEFORE BREAKFAST AND  DINNER 02/27/21   Jarold Medici, MD  [facility] VERIO test strip USE 1 STRIP TO CHECK GLUCOSE TWICE DAILY 03/13/22   Jarold Medici, MD  Potassium Chloride  (KLOR-CON  PO)     [provider]    Family History Family History  Problem Relation Age of Onset   Diabetes Mother    Hypertension Mother    Asthma Father    Heart attack Father 43   Stroke Father 6   Diabetes Father    Breast cancer Sister 49   Allergic rhinitis Brother    Diabetes Brother    Diabetes Brother    Atopy Paternal Grandmother     Social History Social History   Tobacco Use   Smoking status: Never    Passive exposure: Never   Smokeless tobacco: Never  Vaping Use   Vaping status: Never Used  Substance Use Topics   Alcohol use: No   Drug use: No     Allergies   Pravastatin ; Haemophilus influenzae vaccines; Sulfa antibiotics; Sulfonamide derivatives; Tetanus toxoid, adsorbed; and Tetanus  toxoids   Review of Systems Review of Systems  Constitutional:  Negative for fever.  HENT:  Positive for congestion, postnasal drip, sinus pressure and sinus pain.   Respiratory:  Positive for cough.   All other systems reviewed and are negative.    Physical Exam Triage Vital Signs ED Triage Vitals  Encounter Vitals Group     BP 07/08/23 1418 134/76     Systolic BP Percentile --      Diastolic BP Percentile --      Pulse Rate 07/08/23 1418 (!) 59     Resp 07/08/23 1418 20     Temp 07/08/23 1418 98.1 F (36.7 C)     Temp Source 07/08/23 1418 Oral     SpO2 07/08/23 1418 98 %     Weight 07/08/23 1415 212 lb 1.3 oz (96.2 kg)     Height 07/08/23 1415 5' 3 (1.6 m)     Head Circumference --      Peak Flow --      Pain Score 07/08/23 1412 9     Pain Loc --      Pain Education --      Exclude from Growth Chart --    No data found.  Updated Vital Signs BP 134/76 (BP Location: Right Arm)   Pulse 62   Temp 98.1 F (36.7 C) (Oral)   Resp 20   Ht 5' 3 (1.6 m)   Wt 212 lb 1.3 oz (96.2 kg)   SpO2 97%   BMI 37.57 kg/m   Visual Acuity Right Eye Distance:   Left Eye Distance:   Bilateral Distance:    Right Eye Near:   Left Eye Near:    Bilateral Near:     Physical Exam   UC Treatments / Results  Labs (all labs ordered are listed, but only abnormal results are displayed) Labs Reviewed - No data to display  EKG   Radiology No results found.  Procedures Procedures (including critical care time)  Medications Ordered in UC Medications - No data to display  Initial Impression / Assessment and Plan / UC Course  I have reviewed the triage vital signs and the nursing notes.  Pertinent labs & imaging results that were available during my care of the patient were reviewed by me and considered in my medical decision making (see chart for details).  Ddx: Acute respiratory infection, cough Final Clinical Impressions(s) / UC Diagnoses   Final diagnoses:   Acute respiratory infection  Acute cough     Discharge Instructions      Rest, push fluids, take antibiotic as directed, use cough medicine sparingly as it make you sleepy.  Follow-up with PCP/ENT for further evaluation.  Return as needed     ED Prescriptions     Medication Sig Dispense Auth. Provider   promethazine -dextromethorphan (PROMETHAZINE -DM) 6.25-15 MG/5ML syrup Take 5 mLs by mouth every 12 (twelve) hours as needed for cough. 60 mL Acasia Skilton, NP   doxycycline  (VIBRAMYCIN ) 100 MG capsule Take 1 capsule (100 mg total) by mouth 2 (two) times daily. 20 capsule Shonya Sumida, Rilla, NP   albuterol  (VENTOLIN  HFA) 108 (90 Base) MCG/ACT inhaler Inhale 2 puffs into the lungs every 6 (six) hours as needed for wheezing or shortness of breath. 18 g Leshea Jaggers, Rilla, NP      PDMP not reviewed this encounter.   Aminta Rilla, NP 07/08/23 2048

## 2023-07-17 ENCOUNTER — Ambulatory Visit: Payer: Medicare HMO | Admitting: Internal Medicine

## 2023-07-19 ENCOUNTER — Other Ambulatory Visit: Payer: Self-pay | Admitting: Internal Medicine

## 2023-08-05 DIAGNOSIS — R0602 Shortness of breath: Secondary | ICD-10-CM | POA: Diagnosis not present

## 2023-08-05 DIAGNOSIS — Z961 Presence of intraocular lens: Secondary | ICD-10-CM | POA: Diagnosis not present

## 2023-08-05 DIAGNOSIS — I10 Essential (primary) hypertension: Secondary | ICD-10-CM | POA: Diagnosis not present

## 2023-08-05 DIAGNOSIS — R0600 Dyspnea, unspecified: Secondary | ICD-10-CM | POA: Diagnosis not present

## 2023-08-05 DIAGNOSIS — H353131 Nonexudative age-related macular degeneration, bilateral, early dry stage: Secondary | ICD-10-CM | POA: Diagnosis not present

## 2023-08-05 DIAGNOSIS — G4733 Obstructive sleep apnea (adult) (pediatric): Secondary | ICD-10-CM | POA: Diagnosis not present

## 2023-08-05 DIAGNOSIS — E119 Type 2 diabetes mellitus without complications: Secondary | ICD-10-CM | POA: Diagnosis not present

## 2023-08-05 LAB — HM DIABETES EYE EXAM

## 2023-08-06 ENCOUNTER — Encounter: Payer: Self-pay | Admitting: Internal Medicine

## 2023-08-06 ENCOUNTER — Ambulatory Visit: Payer: Medicare HMO | Admitting: Internal Medicine

## 2023-08-06 ENCOUNTER — Ambulatory Visit (INDEPENDENT_AMBULATORY_CARE_PROVIDER_SITE_OTHER): Payer: Medicare HMO

## 2023-08-06 VITALS — BP 122/64 | Temp 97.4°F | Ht 63.0 in | Wt 221.0 lb

## 2023-08-06 VITALS — BP 122/64 | HR 62 | Temp 97.4°F | Ht 63.0 in | Wt 221.2 lb

## 2023-08-06 DIAGNOSIS — Z0000 Encounter for general adult medical examination without abnormal findings: Secondary | ICD-10-CM

## 2023-08-06 DIAGNOSIS — I700 Atherosclerosis of aorta: Secondary | ICD-10-CM

## 2023-08-06 DIAGNOSIS — E66812 Obesity, class 2: Secondary | ICD-10-CM

## 2023-08-06 DIAGNOSIS — I119 Hypertensive heart disease without heart failure: Secondary | ICD-10-CM

## 2023-08-06 DIAGNOSIS — Z6839 Body mass index (BMI) 39.0-39.9, adult: Secondary | ICD-10-CM | POA: Diagnosis not present

## 2023-08-06 DIAGNOSIS — E041 Nontoxic single thyroid nodule: Secondary | ICD-10-CM | POA: Diagnosis not present

## 2023-08-06 DIAGNOSIS — E785 Hyperlipidemia, unspecified: Secondary | ICD-10-CM

## 2023-08-06 DIAGNOSIS — E1169 Type 2 diabetes mellitus with other specified complication: Secondary | ICD-10-CM

## 2023-08-06 MED ORDER — CARVEDILOL 3.125 MG PO TABS: 3.1250 mg | ORAL_TABLET | Freq: Two times a day (BID) | ORAL | 1 refills | Status: AC

## 2023-08-06 MED ORDER — FUROSEMIDE 20 MG PO TABS: 20.0000 mg | ORAL_TABLET | Freq: Every day | ORAL | 1 refills | Status: AC | PRN

## 2023-08-06 NOTE — Patient Instructions (Addendum)
[name] , Thank you for taking time to come for your Medicare Wellness Visit. I appreciate your ongoing commitment to your health goals. Please review the following plan we discussed and let me know if I can assist you in the future.   Referrals/Orders/Follow-Ups/Clinician Recommendations: none  This is a list of the screening recommended for you and due dates:  Health Maintenance  Topic Date Due   Eye exam for diabetics  08/02/2023   COVID-19 Vaccine (4 - 2024-25 season) 08/22/2023*   Zoster (Shingles) Vaccine (1 of 2) 08/27/2023*   Flu Shot  09/22/2023*   Pneumonia Vaccine (1 of 2 - PCV) 08/05/2024*   Yearly kidney health urinalysis for diabetes  09/17/2023   Complete foot exam   09/17/2023   Hemoglobin A1C  11/27/2023   Mammogram  01/02/2024   Yearly kidney function blood test for diabetes  05/28/2024   Medicare Annual Wellness Visit  08/05/2024   Colon Cancer Screening  05/14/2025   DEXA scan (bone density measurement)  Completed   Hepatitis C Screening  Completed   HPV Vaccine  Aged Out   DTaP/Tdap/Td vaccine  Discontinued  *Topic was postponed. The date shown is not the original due date.    Advanced directives: (Copy Requested) Please bring a copy of your health care power of attorney and living will to the office to be added to your chart at your convenience.  Next Medicare Annual Wellness Visit scheduled for next year: yes   insert Preventive Care attachment Insert FALL PREVENTION attachment if needed

## 2023-08-06 NOTE — Progress Notes (Signed)
I,[name], CMA,acting as a Neurosurgeon for Angelica Aliment, MD.,have documented all relevant documentation on the behalf of Angelica Aliment, MD,as directed by  Angelica Aliment, MD while in the presence of Angelica Aliment, MD.  Subjective:  Patient ID: [name] , female    DOB: 1946-03-25 , 78 y.o.   MRN: [id number]  Chief Complaint  Patient presents with   Hypertension   Diabetes    HPI  The patient is here today for a diabetes and blood pressure f/u. She reports compliance with meds. She denies chest pain, shortness of breath and headaches. She denies having any specific questions or concerns.   Upon medication review, she states she is taking rosuvastatin - even though she stated at last visit it caused myalgias.  She is no longer taking Zetia due to headaches.  Additionally, she states she had her diabetic eye exam performed yesterday.   AWV completed with [facility] Advisor, [name].      Hypertension This is a chronic problem. The current episode started more than 1 year ago. The problem has been gradually improving since onset. The problem is controlled. Pertinent negatives include no blurred vision, chest pain, palpitations or shortness of breath. Risk factors for coronary artery disease include diabetes mellitus, dyslipidemia, obesity, post-menopausal state and sedentary lifestyle. Past treatments include angiotensin blockers and diuretics.  Diabetes She presents for her follow-up diabetic visit. She has type 2 diabetes mellitus. Her disease course has been stable. There are no hypoglycemic associated symptoms. Pertinent negatives for diabetes include no blurred vision and no chest pain. There are no hypoglycemic complications. Risk factors for coronary artery disease include dyslipidemia, diabetes mellitus, hypertension, obesity, post-menopausal and sedentary lifestyle. She is following a generally healthy diet. Her home blood glucose trend is fluctuating minimally. Her  breakfast blood glucose is taken between 8-9 am. Her breakfast blood glucose range is generally 110-130 mg/dl. An ACE inhibitor/angiotensin II receptor blocker is being taken. Eye exam is current.     Past Medical History:  Diagnosis Date   Arthritis    hands   Bladder infection    Carpal tunnel syndrome    Complication of anesthesia    Difficult to arouse   Diabetes mellitus    GERD (gastroesophageal reflux disease)    Hypertension    Sleep apnea      Family History  Problem Relation Age of Onset   Diabetes Mother    Hypertension Mother    Asthma Father    Heart attack Father 13   Stroke Father 87   Diabetes Father    Breast cancer Sister 53   Allergic rhinitis Brother    Diabetes Brother    Diabetes Brother    Atopy Paternal Grandmother      Current Outpatient Medications:    albuterol (VENTOLIN HFA) 108 (90 Base) MCG/ACT inhaler, Inhale 2 puffs into the lungs every 6 (six) hours as needed for wheezing or shortness of breath., Disp: 18 g, Rfl: 0   Ascorbic Acid (VITAMIN C) 1000 MG tablet, Take 1,000 mg by mouth daily., Disp: , Rfl:    ASPIRIN 81 PO, , Disp: , Rfl:    aspirin EC 81 MG tablet, Take 1 tablet (81 mg total) by mouth 2 (two) times daily., Disp: 60 tablet, Rfl: 0   Azelastine HCl 0.15 % SOLN, Place 2 sprays into the nose daily. (Patient not taking: Reported on 08/06/2023), Disp: 30 mL, Rfl: 3   Bacillus Coagulans-Inulin (PROBIOTIC FORMULA PO), Take 1  capsule by mouth 2 (two) times a week., Disp: , Rfl:    carvedilol (COREG) 3.125 MG tablet, Take 1 tablet (3.125 mg total) by mouth 2 (two) times daily., Disp: 180 tablet, Rfl: 1   Cholecalciferol (VITAMIN D) 50 MCG (2000 UT) tablet, Take 2,000 Units by mouth daily., Disp: , Rfl:    clotrimazole-betamethasone (LOTRISONE) cream, Apply topically 2 (two) times daily as needed. (Patient not taking: Reported on 08/06/2023), Disp: 30 g, Rfl: 1   Coenzyme Q10 (COQ10) 100 MG CAPS, Take 100 mg by mouth daily with lunch. ,  Disp: , Rfl:    EPINEPHrine 0.3 mg/0.3 mL IJ SOAJ injection, Inject 0.3 mg into the muscle as needed for anaphylaxis., Disp: 1 each, Rfl: 2   estradiol (ESTRACE) 0.1 MG/GM vaginal cream, Place 1 Applicatorful vaginally daily as needed (dryness). (Patient not taking: Reported on 08/06/2023), Disp: , Rfl:    famotidine (PEPCID) 20 MG tablet, One after supper, Disp: 90 tablet, Rfl: 3   fluticasone (FLONASE) 50 MCG/ACT nasal spray, Place 1 spray into both nostrils daily as needed for allergies or rhinitis., Disp: , Rfl:    furosemide (LASIX) 20 MG tablet, Take 1 tablet (20 mg total) by mouth daily as needed (fluid)., Disp: 90 tablet, Rfl: 1   metFORMIN (GLUCOPHAGE) 500 MG tablet, TAKE 1 TABLET BY MOUTH TWICE DAILY WITH A MEAL, Disp: 180 tablet, Rfl: 0   montelukast (SINGULAIR) 10 MG tablet, Take 1 tablet (10 mg total) by mouth at bedtime. (Patient not taking: Reported on 08/06/2023), Disp: 30 tablet, Rfl: 3   nystatin (MYCOSTATIN/NYSTOP) powder, Apply 1 Application topically 2 (two) times daily. To affected area(s) (Patient not taking: Reported on 08/06/2023), Disp: 60 g, Rfl: 1   nystatin cream (MYCOSTATIN), APPLY  CREAM TOPICALLY TWICE DAILY AS NEEDED (Patient not taking: Reported on 08/06/2023), Disp: 30 g, Rfl: 0   OneTouch Delica Lancets 33G MISC, USE   TO CHECK GLUCOSE BEFORE BREAKFAST AND  DINNER, Disp: 100 each, Rfl: 0   ONETOUCH VERIO test strip, USE 1 STRIP TO CHECK GLUCOSE TWICE DAILY, Disp: 100 each, Rfl: 0   OXYGEN, Inhale 1 L into the lungs at bedtime as needed (oxygen). (Patient not taking: Reported on 08/06/2023), Disp: , Rfl:    pantoprazole (PROTONIX) 40 MG tablet, Take 1 tablet (40 mg total) by mouth daily., Disp: 30 tablet, Rfl: 5   Potassium Chloride (KLOR-CON PO), , Disp: , Rfl:    potassium chloride SA (KLOR-CON M) 20 MEQ tablet, One tab po every day prn, with each dose of furosemide., Disp: 90 tablet, Rfl: 1   promethazine-dextromethorphan (PROMETHAZINE-DM) 6.25-15 MG/5ML syrup, Take 5  mLs by mouth every 12 (twelve) hours as needed for cough., Disp: 60 mL, Rfl: 0   rosuvastatin (CRESTOR) 20 MG tablet, Take 20 mg by mouth daily., Disp: , Rfl:    valsartan-hydrochlorothiazide (DIOVAN-HCT) 80-12.5 MG tablet, Take 1 tablet by mouth daily., Disp: , Rfl:    Allergies  Allergen Reactions   Pravastatin Hives   Haemophilus Influenzae Vaccines     Nausea, pain, body weakness   Sulfa Antibiotics Itching   Sulfonamide Derivatives Itching   Tetanus Toxoid, Adsorbed Other (See Comments)   Tetanus Toxoids Swelling    Arm swelled at injection site     Review of Systems  Constitutional: Negative.   Eyes:  Negative for blurred vision.  Respiratory: Negative.  Negative for shortness of breath.   Cardiovascular: Negative.  Negative for chest pain and palpitations.  Gastrointestinal: Negative.   Neurological: Negative.  Psychiatric/Behavioral: Negative.       Today's Vitals   08/06/23 0959  BP: 122/64  Temp: (!) 97.4 F (36.3 C)  SpO2: 98%  Weight: 221 lb (100.2 kg)  Height: 5\' 3"  (1.6 m)   Body mass index is 39.15 kg/m.  Wt Readings from Last 3 Encounters:  08/06/23 221 lb (100.2 kg)  08/06/23 221 lb 3.2 oz (100.3 kg)  07/08/23 212 lb 1.3 oz (96.2 kg)     Objective:  Physical Exam Vitals and nursing note reviewed.  Constitutional:      Appearance: Normal appearance. She is obese.  HENT:     Head: Normocephalic and atraumatic.  Eyes:     Extraocular Movements: Extraocular movements intact.  Cardiovascular:     Rate and Rhythm: Normal rate and regular rhythm.     Heart sounds: Normal heart sounds.  Pulmonary:     Effort: Pulmonary effort is normal.     Breath sounds: Normal breath sounds.  Musculoskeletal:     Cervical back: Normal range of motion.  Skin:    General: Skin is warm.  Neurological:     General: No focal deficit present.     Mental Status: She is alert.  Psychiatric:        Mood and Affect: Mood normal.        Behavior: Behavior normal.          Assessment And Plan:  Hypertensive heart disease without heart failure Assessment & Plan: Chronic, controlled.  She will continue with carvedilol 3.125mg  twice daily and valsartan/hct 80/12.5mg  daily. She is encouraged to follow low sodium diet. She will rto in August 2025 for her next physical exam.    Abdominal aortic atherosclerosis (HCC) Assessment & Plan: Chronic, LDL goal < 70.  She will c/w rosuvastatin 20mg  daily. She is encouraged to follow a heart healthy lifestyle.    Dyslipidemia associated with type 2 diabetes mellitus (HCC) Assessment & Plan: Chronic, LDL goal < 70.  She will continue with rosuvastatin daily and metformin twice daily. She will f/u in four months for re-evaluation. Unfortunately, due to recurrent UTI, she is not a good candidate for SGLT2i therapy. She does not wish to take additional medications for diabetes. Therefore, she is encouraged to be intentional about daily movement and to aim for at least 150 minutes of exercise per week.   Orders: -     CMP14+EGFR; Future -     Lipid panel; Future -     Hemoglobin A1c; Future  Thyroid nodule Assessment & Plan: Incidental finding on Maxillofacial CT, thyroid nodule on the right measuring 10 mm. No follow-up imaging recommended.  Currently, she is not experiencing any compressive symptoms.  She is encouraged to notify me if she feels like food is getting stuck in her throat or if she has difficulty swallowing.    Class 2 severe obesity due to excess calories with serious comorbidity and body mass index (BMI) of 39.0 to 39.9 in adult [facility]) Assessment & Plan: She is encouraged to strive for BMI<30 to decrease cardiac risk. Advised to aim for at least 150 minutes of exercise per week.      Other orders -     Carvedilol; Take 1 tablet (3.125 mg total) by mouth 2 (two) times daily.  Dispense: 180 tablet; Refill: 1 -     Furosemide; Take 1 tablet (20 mg total) by mouth daily as needed (fluid).   Dispense: 90 tablet; Refill: 1     Return PT wants to  move PHYS to August, for schedule for lab visit in April..  Patient was given opportunity to ask questions. Patient verbalized understanding of the plan and was able to repeat key elements of the plan. All questions were answered to their satisfaction.    I, Angelica Aliment, MD, have reviewed all documentation for this visit. The documentation on 08/06/23 for the exam, diagnosis, procedures, and orders are all accurate and complete.   IF YOU HAVE BEEN REFERRED TO A SPECIALIST, IT MAY TAKE 1-2 WEEKS TO SCHEDULE/PROCESS THE REFERRAL. IF YOU HAVE NOT HEARD FROM US/SPECIALIST IN TWO WEEKS, PLEASE GIVE [place] A CALL AT [phone] X [phone].   THE PATIENT IS ENCOURAGED TO PRACTICE SOCIAL DISTANCING DUE TO THE COVID-19 PANDEMIC.

## 2023-08-06 NOTE — Progress Notes (Signed)
Subjective:   [name] is a 78 y.o. female who presents for Medicare Annual (Subsequent) preventive examination.  Visit Complete: In person    Cardiac Risk Factors include: advanced age (>9men, >58 women);diabetes mellitus;dyslipidemia;hypertension     Objective:    Today's Vitals   08/06/23 0954  BP: 122/64  Pulse: 62  Temp: (!) 97.4 F (36.3 C)  TempSrc: Oral  SpO2: 94%  Weight: 221 lb 3.2 oz (100.3 kg)  Height: 5\' 3"  (1.6 m)   Body mass index is 39.18 kg/m.     08/06/2023   10:03 AM 10/21/2022    9:06 PM 07/10/2022   12:34 PM 06/14/2021    2:58 PM 05/21/2021   11:11 AM 05/09/2021   11:30 AM 06/08/2020   12:18 PM  Advanced Directives  Does Patient Have a Medical Advance Directive? Yes Yes Yes Yes Yes Yes Yes  Type of Estate agent of [place];Living will Healthcare Power of [place];Living will Healthcare Power of [place];Living will Healthcare Power of [place];Living will Healthcare Power of [place];Living will Healthcare Power of [place];Living will Healthcare Power of [place];Living will  Does patient want to make changes to medical advance directive?     No - Patient declined    Copy of Healthcare Power of Attorney in Chart? No - copy requested  No - copy requested No - copy requested No - copy requested  No - copy requested    Current Medications (verified) Outpatient Encounter Medications as of 08/06/2023  Medication Sig   albuterol (VENTOLIN HFA) 108 (90 Base) MCG/ACT inhaler Inhale 2 puffs into the lungs every 6 (six) hours as needed for wheezing or shortness of breath.   Ascorbic Acid (VITAMIN C) 1000 MG tablet Take 1,000 mg by mouth daily.   ASPIRIN 81 PO    aspirin EC 81 MG tablet Take 1 tablet (81 mg total) by mouth 2 (two) times daily.   Bacillus Coagulans-Inulin (PROBIOTIC FORMULA PO) Take 1 capsule by mouth 2 (two) times a week.   carvedilol (COREG) 3.125 MG tablet Take 1 tablet (3.125 mg total) by mouth 2 (two)  times daily.   Cholecalciferol (VITAMIN D) 50 MCG (2000 UT) tablet Take 2,000 Units by mouth daily.   Coenzyme Q10 (COQ10) 100 MG CAPS Take 100 mg by mouth daily with lunch.    EPINEPHrine 0.3 mg/0.3 mL IJ SOAJ injection Inject 0.3 mg into the muscle as needed for anaphylaxis.   famotidine (PEPCID) 20 MG tablet One after supper   fluticasone (FLONASE) 50 MCG/ACT nasal spray Place 1 spray into both nostrils daily as needed for allergies or rhinitis.   furosemide (LASIX) 20 MG tablet Take 1 tablet (20 mg total) by mouth daily as needed (fluid).   metFORMIN (GLUCOPHAGE) 500 MG tablet TAKE 1 TABLET BY MOUTH TWICE DAILY WITH A MEAL   OneTouch Delica Lancets 33G MISC USE   TO CHECK GLUCOSE BEFORE BREAKFAST AND  DINNER   ONETOUCH VERIO test strip USE 1 STRIP TO CHECK GLUCOSE TWICE DAILY   pantoprazole (PROTONIX) 40 MG tablet Take 1 tablet (40 mg total) by mouth daily.   Potassium Chloride (KLOR-CON PO)    potassium chloride SA (KLOR-CON M) 20 MEQ tablet One tab po every day prn, with each dose of furosemide.   promethazine-dextromethorphan (PROMETHAZINE-DM) 6.25-15 MG/5ML syrup Take 5 mLs by mouth every 12 (twelve) hours as needed for cough.   rosuvastatin (CRESTOR) 20 MG tablet Take 20 mg by mouth daily.   valsartan-hydrochlorothiazide (DIOVAN-HCT) 80-12.5 MG tablet Take  1 tablet by mouth daily.   Azelastine HCl 0.15 % SOLN Place 2 sprays into the nose daily. (Patient not taking: Reported on 08/06/2023)   clotrimazole-betamethasone (LOTRISONE) cream Apply topically 2 (two) times daily as needed. (Patient not taking: Reported on 08/06/2023)   doxycycline (VIBRAMYCIN) 100 MG capsule Take 1 capsule (100 mg total) by mouth 2 (two) times daily. (Patient not taking: Reported on 08/06/2023)   estradiol (ESTRACE) 0.1 MG/GM vaginal cream Place 1 Applicatorful vaginally daily as needed (dryness). (Patient not taking: Reported on 08/06/2023)   ezetimibe (ZETIA) 10 MG tablet Take 1 tablet (10 mg total) by mouth daily.  (Patient not taking: Reported on 08/06/2023)   montelukast (SINGULAIR) 10 MG tablet Take 1 tablet (10 mg total) by mouth at bedtime. (Patient not taking: Reported on 08/06/2023)   nitrofurantoin, macrocrystal-monohydrate, (MACROBID) 100 MG capsule Take 100 mg by mouth 2 (two) times daily. (Patient not taking: Reported on 08/06/2023)   nystatin (MYCOSTATIN/NYSTOP) powder Apply 1 Application topically 2 (two) times daily. To affected area(s) (Patient not taking: Reported on 08/06/2023)   nystatin cream (MYCOSTATIN) APPLY  CREAM TOPICALLY TWICE DAILY AS NEEDED (Patient not taking: Reported on 08/06/2023)   OXYGEN Inhale 1 L into the lungs at bedtime as needed (oxygen). (Patient not taking: Reported on 08/06/2023)   No facility-administered encounter medications on file as of 08/06/2023.    Allergies (verified) Pravastatin; Haemophilus influenzae vaccines; Sulfa antibiotics; Sulfonamide derivatives; Tetanus toxoid, adsorbed; and Tetanus toxoids   History: Past Medical History:  Diagnosis Date   Arthritis    hands   Bladder infection    Carpal tunnel syndrome    Complication of anesthesia    Difficult to arouse   Diabetes mellitus    GERD (gastroesophageal reflux disease)    Hypertension    Sleep apnea    Past Surgical History:  Procedure Laterality Date   ABDOMINAL HYSTERECTOMY  1985   partial   APPENDECTOMY     age 31   CARPAL TUNNEL RELEASE Bilateral 1980   cataract surgery Right 07/07/2020   Dr. Dione Booze   cataract surgery Left 12/2020   HERNIA REPAIR  1990   TONSILLECTOMY     as a child   TOTAL KNEE ARTHROPLASTY Right 04/19/2019   Procedure: RIGHT TOTAL KNEE ARTHROPLASTY;  Surgeon: Gean Birchwood, MD;  Location: [facility];  Service: Orthopedics;  Laterality: Right;   TOTAL KNEE ARTHROPLASTY Left 05/21/2021   Procedure: LEFT TOTAL KNEE ARTHROPLASTY;  Surgeon: Gean Birchwood, MD;  Location: [facility];  Service: Orthopedics;  Laterality: Left;   Family History  Problem Relation Age of Onset    Diabetes Mother    Hypertension Mother    Asthma Father    Heart attack Father 80   Stroke Father 42   Diabetes Father    Breast cancer Sister 8   Allergic rhinitis Brother    Diabetes Brother    Diabetes Brother    Atopy Paternal Grandmother    Social History   Socioeconomic History   Marital status: Widowed    Spouse name: Not on file   Number of children: 2   Years of education: Not on file   Highest education level: Not on file  Occupational History   Occupation: retired  Tobacco Use   Smoking status: Never    Passive exposure: Never   Smokeless tobacco: Never  Vaping Use   Vaping status: Never Used  Substance and Sexual Activity   Alcohol use: No   Drug use: No   Sexual  activity: Not Currently    Birth control/protection: None  Other Topics Concern   Not on file  Social History Narrative   Not on file   Social Drivers of Health   Financial Resource Strain: Low Risk  (08/06/2023)   Overall Financial Resource Strain (CARDIA)    Difficulty of Paying Living Expenses: Not hard at all  Food Insecurity: No Food Insecurity (08/06/2023)   Hunger Vital Sign    Worried About Running Out of Food in the Last Year: Never true    Ran Out of Food in the Last Year: Never true  Transportation Needs: No Transportation Needs (08/06/2023)   PRAPARE - Administrator, Civil Service (Medical): No    Lack of Transportation (Non-Medical): No  Physical Activity: Insufficiently Active (08/06/2023)   Exercise Vital Sign    Days of Exercise per Week: 2 days    Minutes of Exercise per Session: 10 min  Stress: No Stress Concern Present (08/06/2023)   [employer] of Occupational Health - Occupational Stress Questionnaire    Feeling of Stress : Not at all  Social Connections: Moderately Integrated (08/06/2023)   Social Connection and Isolation Panel [NHANES]    Frequency of Communication with Friends and Family: More than three times a week    Frequency of Social  Gatherings with Friends and Family: Once a week    Attends Religious Services: More than 4 times per year    Active Member of [employer] or Organizations: Yes    Attends Banker Meetings: More than 4 times per year    Marital Status: Widowed    Tobacco Counseling Counseling given: Not Answered   Clinical Intake:  Pre-visit preparation completed: Yes  Pain : No/denies pain     Nutritional Status: BMI > 30  Obese Nutritional Risks: None Diabetes: Yes CBG done?: No Did pt. bring in CBG monitor from home?: No  How often do you need to have someone help you when you read instructions, pamphlets, or other written materials from your doctor or pharmacy?: 1 - Never  Interpreter Needed?: No  Information entered by :: NAllen LPN   Activities of Daily Living    08/06/2023    9:56 AM  In your present state of health, do you have any difficulty performing the following activities:  Hearing? 0  Vision? 0  Difficulty concentrating or making decisions? 0  Walking or climbing stairs? 1  Comment due to knees  Dressing or bathing? 0  Doing errands, shopping? 0  Preparing Food and eating ? N  Using the Toilet? N  In the past six months, have you accidently leaked urine? Y  Comment on fluid pills  Do you have problems with loss of bowel control? N  Managing your Medications? N  Managing your Finances? N  Housekeeping or managing your Housekeeping? N    Patient Care Team: Dorothyann Peng, MD as PCP - General (Internal Medicine) [place], Maryjane Hurter, [facility] (Inactive) (Pharmacist)  Indicate any recent Medical Services you may have received from other than [facility] providers in the past year (date may be approximate).     Assessment:   This is a routine wellness examination for [place].  Hearing/Vision screen Hearing Screening - Comments:: Denies hearing issues Vision Screening - Comments:: Regular eye exams, [facility]   Goals Addressed             This Visit's  Progress    Patient Stated       08/06/2023, get of  some medications       Depression Screen    08/06/2023   10:04 AM 01/22/2023   11:17 AM 11/13/2022    8:44 AM 09/17/2022   11:22 AM 07/10/2022   12:34 PM 05/21/2022   11:55 AM 02/18/2022   12:25 PM  PHQ 2/9 Scores  PHQ - 2 Score 0 0 0 0 0 0 0  PHQ- 9 Score 4 0 3        Fall Risk    08/06/2023   10:04 AM 01/22/2023   11:17 AM 11/13/2022    8:43 AM 09/17/2022   11:22 AM 07/10/2022   12:34 PM  Fall Risk   Falls in the past year? 0 0 0 0 0  Number falls in past yr: 0 0 0 0 0  Injury with Fall? 0 0 0 0 0  Risk for fall due to :  No Fall Risks No Fall Risks No Fall Risks Medication side effect  Follow up Falls prevention discussed;Falls evaluation completed Falls evaluation completed Falls evaluation completed Falls evaluation completed Falls prevention discussed;Education provided;Falls evaluation completed    MEDICARE RISK AT HOME: Medicare Risk at Home Any stairs in or around the home?: Yes If so, are there any without handrails?: No Home free of loose throw rugs in walkways, pet beds, electrical cords, etc?: Yes Adequate lighting in your home to reduce risk of falls?: Yes Life alert?: No Use of a cane, walker or w/c?: No Grab bars in the bathroom?: No Shower chair or bench in shower?: No Elevated toilet seat or a handicapped toilet?: Yes  TIMED UP AND GO:  Was the test performed?  Yes  Length of time to ambulate 10 feet: 6 sec Gait slow and steady without use of assistive device    Cognitive Function:        08/06/2023   10:06 AM 07/10/2022   12:35 PM 06/14/2021    3:01 PM 06/08/2020   12:21 PM 06/09/2019   11:53 AM  6CIT Screen  What Year? 0 points 0 points 0 points 0 points 0 points  What month? 0 points 0 points 0 points 0 points 0 points  What time? 3 points 0 points 0 points 0 points 0 points  Count back from 20 0 points 0 points 0 points 2 points 0 points  Months in reverse 4 points 0 points 2 points 2  points 2 points  Repeat phrase 2 points 8 points 2 points 0 points 4 points  Total Score 9 points 8 points 4 points 4 points 6 points    Immunizations Immunization History  Administered Date(s) Administered   PFIZER(Purple Top)SARS-COV-2 Vaccination 09/02/2019, 09/27/2019, 06/13/2020    TDAP status: Up to date  Flu Vaccine status: Declined, Education has been provided regarding the importance of this vaccine but patient still declined. Advised may receive this vaccine at local pharmacy or Health Dept. Aware to provide a copy of the vaccination record if obtained from local pharmacy or Health Dept. Verbalized acceptance and understanding.  Pneumococcal vaccine status: Declined,  Education has been provided regarding the importance of this vaccine but patient still declined. Advised may receive this vaccine at local pharmacy or Health Dept. Aware to provide a copy of the vaccination record if obtained from local pharmacy or Health Dept. Verbalized acceptance and understanding.   Covid-19 vaccine status: Declined, Education has been provided regarding the importance of this vaccine but patient still declined. Advised may receive this vaccine at local pharmacy or Health Dept.or vaccine  clinic. Aware to provide a copy of the vaccination record if obtained from local pharmacy or Health Dept. Verbalized acceptance and understanding.  Qualifies for Shingles Vaccine? Yes   Zostavax completed No   Shingrix Completed?: No.    Education has been provided regarding the importance of this vaccine. Patient has been advised to call insurance company to determine out of pocket expense if they have not yet received this vaccine. Advised may also receive vaccine at local pharmacy or Health Dept. Verbalized acceptance and understanding.  Screening Tests Health Maintenance  Topic Date Due   OPHTHALMOLOGY EXAM  08/02/2023   COVID-19 Vaccine (4 - 2024-25 season) 08/22/2023 (Originally 02/23/2023)   Zoster  Vaccines- Shingrix (1 of 2) 08/27/2023 (Originally 11/16/1964)   INFLUENZA VACCINE  09/22/2023 (Originally 01/23/2023)   Pneumonia Vaccine 48+ Years old (1 of 2 - PCV) 08/05/2024 (Originally 11/16/1951)   Diabetic kidney evaluation - Urine ACR  09/17/2023   FOOT EXAM  09/17/2023   HEMOGLOBIN A1C  11/27/2023   MAMMOGRAM  01/02/2024   Diabetic kidney evaluation - eGFR measurement  05/28/2024   Medicare Annual Wellness (AWV)  08/05/2024   Colonoscopy  05/14/2025   DEXA SCAN  Completed   Hepatitis C Screening  Completed   HPV VACCINES  Aged Out   DTaP/Tdap/Td  Discontinued    Health Maintenance  Health Maintenance Due  Topic Date Due   OPHTHALMOLOGY EXAM  08/02/2023    Colorectal cancer screening: No longer required.   Mammogram status: Completed 01/02/2023. Repeat every year  Bone Density status: Completed 07/04/2023.   Lung Cancer Screening: (Low Dose CT Chest recommended if Age 39-80 years, 20 pack-year currently smoking OR have quit w/in 15years.) does not qualify.   Lung Cancer Screening Referral: no  Additional Screening:  Hepatitis C Screening: does qualify; Completed 09/30/2012  Vision Screening: Recommended annual ophthalmology exams for early detection of glaucoma and other disorders of the eye. Is the patient up to date with their annual eye exam?  Yes  Who is the provider or what is the name of the office in which the patient attends annual eye exams? [facility] If pt is not established with a provider, would they like to be referred to a provider to establish care? No .   Dental Screening: Recommended annual dental exams for proper oral hygiene  Diabetic Foot Exam: Diabetic Foot Exam: Completed 09/17/2022  Community Resource Referral / Chronic Care Management: CRR required this visit?  No   CCM required this visit?  No     Plan:     I have personally reviewed and noted the following in the patient's chart:   Medical and social history Use of alcohol,  tobacco or illicit drugs  Current medications and supplements including opioid prescriptions. Patient is not currently taking opioid prescriptions. Functional ability and status Nutritional status Physical activity Advanced directives List of other physicians Hospitalizations, surgeries, and ER visits in previous 12 months Vitals Screenings to include cognitive, depression, and falls Referrals and appointments  In addition, I have reviewed and discussed with patient certain preventive protocols, quality metrics, and best practice recommendations. A written personalized care plan for preventive services as well as general preventive health recommendations were provided to patient.     Barb Merino, LPN   1/61/0960   After Visit Summary: (In Person-Printed) AVS printed and given to the patient  Nurse Notes: none

## 2023-08-06 NOTE — Assessment & Plan Note (Signed)
Chronic, LDL goal < 70.  She will continue with rosuvastatin daily and metformin twice daily. She will f/u in four months for re-evaluation. Unfortunately, due to recurrent UTI, she is not a good candidate for SGLT2i therapy. She does not wish to take additional medications for diabetes. Therefore, she is encouraged to be intentional about daily movement and to aim for at least 150 minutes of exercise per week.

## 2023-08-06 NOTE — Assessment & Plan Note (Signed)
Chronic, LDL goal < 70.  She will c/w rosuvastatin 20mg  daily. She is encouraged to follow a heart healthy lifestyle.

## 2023-08-06 NOTE — Assessment & Plan Note (Signed)
She is encouraged to strive for BMI<30 to decrease cardiac risk. Advised to aim for at least 150 minutes of exercise per week.

## 2023-08-06 NOTE — Assessment & Plan Note (Signed)
Incidental finding on Maxillofacial CT, thyroid nodule on the right measuring 10 mm. No follow-up imaging recommended.  Currently, she is not experiencing any compressive symptoms.  She is encouraged to notify me if she feels like food is getting stuck in her throat or if she has difficulty swallowing.

## 2023-08-06 NOTE — Assessment & Plan Note (Signed)
Chronic, controlled.  She will continue with carvedilol 3.125mg  twice daily and valsartan/hct 80/12.5mg  daily. She is encouraged to follow low sodium diet. She will rto in August 2025 for her next physical exam.

## 2023-08-06 NOTE — Patient Instructions (Signed)
Hypertension, Adult Hypertension is another name for high blood pressure. High blood pressure forces your heart to work harder to pump blood. This can cause problems over time. There are two numbers in a blood pressure reading. There is a top number (systolic) over a bottom number (diastolic). It is best to have a blood pressure that is below 120/80. What are the causes? The cause of this condition is not known. Some other conditions can lead to high blood pressure. What increases the risk? Some lifestyle factors can make you more likely to develop high blood pressure: Smoking. Not getting enough exercise or physical activity. Being overweight. Having too much fat, sugar, calories, or salt (sodium) in your diet. Drinking too much alcohol. Other risk factors include: Having any of these conditions: Heart disease. Diabetes. High cholesterol. Kidney disease. Obstructive sleep apnea. Having a family history of high blood pressure and high cholesterol. Age. The risk increases with age. Stress. What are the signs or symptoms? High blood pressure may not cause symptoms. Very high blood pressure (hypertensive crisis) may cause: Headache. Fast or uneven heartbeats (palpitations). Shortness of breath. Nosebleed. Vomiting or feeling like you may vomit (nauseous). Changes in how you see. Very bad chest pain. Feeling dizzy. Seizures. How is this treated? This condition is treated by making healthy lifestyle changes, such as: Eating healthy foods. Exercising more. Drinking less alcohol. Your doctor may prescribe medicine if lifestyle changes do not help enough and if: Your top number is above 130. Your bottom number is above 80. Your personal target blood pressure may vary. Follow these instructions at home: Eating and drinking  If told, follow the DASH eating plan. To follow this plan: Fill one half of your plate at each meal with fruits and vegetables. Fill one fourth of your plate  at each meal with whole grains. Whole grains include whole-wheat pasta, brown rice, and whole-grain bread. Eat or drink low-fat dairy products, such as skim milk or low-fat yogurt. Fill one fourth of your plate at each meal with low-fat (lean) proteins. Low-fat proteins include fish, chicken without skin, eggs, beans, and tofu. Avoid fatty meat, cured and processed meat, or chicken with skin. Avoid pre-made or processed food. Limit the amount of salt in your diet to less than 1,500 mg each day. Do not drink alcohol if: Your doctor tells you not to drink. You are pregnant, may be pregnant, or are planning to become pregnant. If you drink alcohol: Limit how much you have to: 0-1 drink a day for women. 0-2 drinks a day for men. Know how much alcohol is in your drink. In the [place] one drink equals one 12 oz bottle of beer (355 mL), one 5 oz glass of wine (148 mL), or one 1 oz glass of hard liquor (44 mL). Lifestyle  Work with your doctor to stay at a healthy weight or to lose weight. Ask your doctor what the best weight is for you. Get at least 30 minutes of exercise that causes your heart to beat faster (aerobic exercise) most days of the week. This may include walking, swimming, or biking. Get at least 30 minutes of exercise that strengthens your muscles (resistance exercise) at least 3 days a week. This may include lifting weights or doing Pilates. Do not smoke or use any products that contain nicotine or tobacco. If you need help quitting, ask your doctor. Check your blood pressure at home as told by your doctor. Keep all follow-up visits. Medicines Take over-the-counter and prescription medicines  only as told by your doctor. Follow directions carefully. Do not skip doses of blood pressure medicine. The medicine does not work as well if you skip doses. Skipping doses also puts you at risk for problems. Ask your doctor about side effects or reactions to medicines that you should watch  for. Contact a doctor if: You think you are having a reaction to the medicine you are taking. You have headaches that keep coming back. You feel dizzy. You have swelling in your ankles. You have trouble with your vision. Get help right away if: You get a very bad headache. You start to feel mixed up (confused). You feel weak or numb. You feel faint. You have very bad pain in your: Chest. Belly (abdomen). You vomit more than once. You have trouble breathing. These symptoms may be an emergency. Get help right away. Call 911. Do not wait to see if the symptoms will go away. Do not drive yourself to the hospital. Summary Hypertension is another name for high blood pressure. High blood pressure forces your heart to work harder to pump blood. For most people, a normal blood pressure is less than 120/80. Making healthy choices can help lower blood pressure. If your blood pressure does not get lower with healthy choices, you may need to take medicine. This information is not intended to replace advice given to you by your health care provider. Make sure you discuss any questions you have with your health care provider. Document Revised: 03/29/2021 Document Reviewed: 03/29/2021 Elsevier Patient Education  2024 [employer].

## 2023-08-29 ENCOUNTER — Other Ambulatory Visit: Payer: Self-pay | Admitting: Internal Medicine

## 2023-09-02 DIAGNOSIS — G4733 Obstructive sleep apnea (adult) (pediatric): Secondary | ICD-10-CM | POA: Diagnosis not present

## 2023-09-02 DIAGNOSIS — R0600 Dyspnea, unspecified: Secondary | ICD-10-CM | POA: Diagnosis not present

## 2023-09-02 DIAGNOSIS — I10 Essential (primary) hypertension: Secondary | ICD-10-CM | POA: Diagnosis not present

## 2023-09-02 DIAGNOSIS — R0602 Shortness of breath: Secondary | ICD-10-CM | POA: Diagnosis not present

## 2023-09-07 NOTE — Progress Notes (Unsigned)
 Sherren Kerns, female    DOB: 22-Jan-1946   MRN: [id number]   Brief patient profile:  26 yobf never smoker  with ? H/o allergic rhinitis but  much worse since spring 2020 rhinitis itching sneezing and then coughing no better with nyquil  referred to pulmonary clinic 06/05/2022 by [name]  for cough.   Allergy w/u  11/29/21 :   strongly Pos IgE 559 multiple pos RAST  dust mite, grass pollen, cockroach, molds, tree pollen and weed pollen    but skin testing neg > no better on allegra/ singulair, atrovent nasa spray     History of Present Illness  06/05/2022  Pulmonary/ 1st office eval/Basil Buffin  Chief Complaint  Patient presents with   Consult    Pt states she a chronic productive cough for the past 8 months.  Dyspnea:  new and doe 100 ft to street and back x 4 months    Cough: sporadic minimal white / assoc with nasal d/c  Sleep: 10 degrees , sleeps  better p rx with tessalon pearls  SABA use: seems to help though not very poor hfa technique Rec Pantoprazole (protonix) 40 mg   Take  30-60 min before first meal of the day and Pepcid (famotidine)  20 mg after supper until return to office   GERD diet reviewed, bed blocks rec  For drainage / drippy nose throat tickle try off Zyrtec  take as needed  CHLORPHENIRAMINE  4 mg   Prednisone 10 mg take  4 each am x 2 days,   2 each am x 2 days,  1 each am x 2 days and stop  If prednisone really helps you, start symbicort 80 Take 2 puffs first thing in am and then another 2 puffs about 12 hours later. (Call for prescription) For breathing or can't stop coughing  > albuterol 2 puff every 4 hours as needed but work on smooth deep breath like we showed you here.  Please schedule a follow up office visit in 4 weeks, call sooner if needed with all medications /inhalers/ solutions in hand    07/17/2022  f/u ov/Taishaun Levels re: cough x years but worse since spring 2020   - brought some  meds, not following above instructions, could not take pred pills, L  knee injection ok tol steroiods/  Chief Complaint  Patient presents with   Follow-up    Cough, PND and white mucus with cough.  Runny nose.  Was not able to take prednisone.  Only took 2 tablets, caused chest and left breast pain.  Dyspnea:  variable  Cough: thick white  Sleeping: elevates most nights /twice daily wakes up and feels needs saba to control cough but not using correctly  SABA use: avg  2-3 x days Still using cough drops "but they are sugarless like you said"  Rec Pantoprazole (protonix) 40 mg   Take  30-60 min before first meal of the day and Pepcid (famotidine)  20 mg after supper until return to office   GERD diet reviewed, bed blocks rec  CHLORPHENIRAMINE  4 mg  ("Allergy Relief" 4mg    For breathing or can't stop coughing  > albuterol 2 puff every 4 hours as needed but work on smooth deep breath like we showed you here. Depomedrol 120 mg IM - if much better for 5 days then worse again, I will call you in new inhaler Please schedule a follow up office visit in 4 weeks, call sooner if needed with all  medications /inhalers/ solutions in hand   08/14/2022  f/u ov/Taleisha Kaczynski re: cough x spring 2020    maint on ppi q am (no pepcid)  overall much better, still globus sensation/ brought all meds but in one bag not sep maint vs prns Chief Complaint  Patient presents with   Follow-up    Occasional cough with a lot of mucus production.  Clear mucus  Dyspnea:  not limited by doe but very inactive  Cough: feels like it gets hung in throat min white  Sleeping: flat bed / pillows and h1 x 2 helps more than anything else to date SABA use: twice a day not really clear why, not needing noct  Rec No change in medication except add pepcid 20 mg after supper  For cough mucinex dm 1200 twice daily with a glass of water Only use your albuterol as a rescue medication  Also  Ok to try albuterol 15 min before an activity (on alternating days)  that you know would usually make you short of breath      Work on inhaler technique:   Please schedule a follow up visit in 3 months but call sooner if needed    [facility] eval 05/08/23 c/w gerd/ pnds/  ? Allergy    09/09/2023  f/u ov/Shikita Vaillancourt re: cough comes and goes x years  maint on gerd rx   Dyspnea:  more so than usual x 2 months  Cough: worse > clear white mucus with nasal congestion Sleeping: worse hs  SABA use:  you told me "three times"  a day  02: none   Nasal congestion much worse x sev days along with fever 101 last night but no ha, resting sob or increased need for saba, n or v or ha   No obvious day to day or daytime variability or assoc excess/ purulent sputum or mucus plugs or hemoptysis or cp or chest tightness, subjective wheeze or overt sinus or hb symptoms.    Also denies any obvious fluctuation of symptoms with weather or environmental changes or other aggravating or alleviating factors except as outlined above   No unusual exposure hx or h/o childhood pna/ asthma or knowledge of premature birth.  Current Allergies, Complete Past Medical History, Past Surgical History, Family History, and Social History were reviewed in [employer] record.  ROS  The following are not active complaints unless bolded Hoarseness, sore throat, dysphagia, dental problems, itching, sneezing,  nasal congestion or discharge of excess mucus or purulent secretions, ear ache,   fever, chills, sweats, unintended wt loss or wt gain, classically pleuritic or exertional cp,  orthopnea pnd or arm/hand swelling  or leg swelling, presyncope, palpitations, abdominal pain, anorexia, nausea, vomiting, diarrhea  or change in bowel habits or change in bladder habits, change in stools or change in urine, dysuria, hematuria,  rash, arthralgias, visual complaints, headache, numbness, weakness or ataxia or problems with walking or coordination,  change in mood or  memory.        Current Meds  Medication Sig   albuterol (VENTOLIN HFA) 108 (90 Base)  MCG/ACT inhaler Inhale 2 puffs into the lungs every 6 (six) hours as needed for wheezing or shortness of breath.   Ascorbic Acid (VITAMIN C) 1000 MG tablet Take 1,000 mg by mouth daily.   ASPIRIN 81 PO    aspirin EC 81 MG tablet Take 1 tablet (81 mg total) by mouth 2 (two) times daily.   Azelastine HCl 0.15 % SOLN Place 2 sprays  into the nose daily.   Bacillus Coagulans-Inulin (PROBIOTIC FORMULA PO) Take 1 capsule by mouth 2 (two) times a week.   carvedilol (COREG) 3.125 MG tablet Take 1 tablet (3.125 mg total) by mouth 2 (two) times daily.   Cholecalciferol (VITAMIN D) 50 MCG (2000 UT) tablet Take 2,000 Units by mouth daily.   clotrimazole-betamethasone (LOTRISONE) cream Apply topically 2 (two) times daily as needed.   Coenzyme Q10 (COQ10) 100 MG CAPS Take 100 mg by mouth daily with lunch.    EPINEPHrine 0.3 mg/0.3 mL IJ SOAJ injection Inject 0.3 mg into the muscle as needed for anaphylaxis.   estradiol (ESTRACE) 0.1 MG/GM vaginal cream Place 1 Applicatorful vaginally daily as needed (dryness).   famotidine (PEPCID) 20 MG tablet One after supper   fluticasone (FLONASE) 50 MCG/ACT nasal spray Place 1 spray into both nostrils daily as needed for allergies or rhinitis.   furosemide (LASIX) 20 MG tablet Take 1 tablet (20 mg total) by mouth daily as needed (fluid).   Lancets (ONETOUCH DELICA PLUS LANCET33G) MISC USE 1  TO CHECK GLUCOSE TWICE DAILY, BEFORE BREAKFAST AND DINNER   metFORMIN (GLUCOPHAGE) 500 MG tablet TAKE 1 TABLET BY MOUTH TWICE DAILY WITH A MEAL   montelukast (SINGULAIR) 10 MG tablet Take 1 tablet (10 mg total) by mouth at bedtime.   nystatin (MYCOSTATIN/NYSTOP) powder Apply 1 Application topically 2 (two) times daily. To affected area(s)   nystatin cream (MYCOSTATIN) APPLY  CREAM TOPICALLY TWICE DAILY AS NEEDED   ONETOUCH VERIO test strip USE 1 STRIP TO CHECK GLUCOSE TWICE DAILY   OXYGEN Inhale 1 L into the lungs at bedtime as needed (oxygen).   pantoprazole (PROTONIX) 40 MG tablet  Take 1 tablet (40 mg total) by mouth daily.   Potassium Chloride (KLOR-CON PO)    potassium chloride SA (KLOR-CON M) 20 MEQ tablet One tab po every day prn, with each dose of furosemide.   rosuvastatin (CRESTOR) 20 MG tablet Take 20 mg by mouth daily.   valsartan-hydrochlorothiazide (DIOVAN-HCT) 80-12.5 MG tablet Take 1 tablet by mouth daily.                        Past Medical History:  Diagnosis Date   Arthritis    hands   Bladder infection    Carpal tunnel syndrome    Complication of anesthesia    Difficult to arouse   Diabetes mellitus    GERD (gastroesophageal reflux disease)    Hypertension    Sleep apnea         Objective:    wts   09/09/2023       219  08/14/2022       211   07/17/22 215 lb 9.6 oz (97.8 kg)  07/10/22 214 lb (97.1 kg)  06/05/22 214 lb 12.8 oz (97.4 kg)    Vital signs reviewed  09/09/2023  - Note at rest 02 sats  99% on RA   General appearance:    mod obese (by bmi) amb bf slt nasal tone to voice    HEENT : mask in place    NECK :  without  apparent JVD/ palpable Nodes/TM    LUNGS: no acc muscle use,  Nl contour chest which is clear to A and P bilaterally without cough on insp or exp maneuvers   CV:  RRR  no s3 or murmur or increase in P2, and no edema   ABD:  soft and nontender   MS:  Gait nl  ext warm without deformities Or obvious joint restrictions  calf tenderness, cyanosis or clubbing    SKIN: warm and dry without lesions    NEURO:  alert, approp, nl sensorium with  no motor or cerebellar deficits apparent.    COVID  Ag  Pos / AB neg        Assessment

## 2023-09-09 ENCOUNTER — Ambulatory Visit: Payer: Medicare HMO | Admitting: Internal Medicine

## 2023-09-09 ENCOUNTER — Encounter: Payer: Self-pay | Admitting: Internal Medicine

## 2023-09-09 VITALS — BP 138/78 | HR 79 | Temp 99.1°F | Ht 63.0 in | Wt 219.0 lb

## 2023-09-09 DIAGNOSIS — J45991 Cough variant asthma: Secondary | ICD-10-CM

## 2023-09-09 DIAGNOSIS — R0981 Nasal congestion: Secondary | ICD-10-CM

## 2023-09-09 DIAGNOSIS — R053 Chronic cough: Secondary | ICD-10-CM

## 2023-09-09 DIAGNOSIS — U071 COVID-19: Secondary | ICD-10-CM

## 2023-09-09 LAB — POCT INFLUENZA A/B
Influenza A, POC: NEGATIVE
Influenza B, POC: NEGATIVE

## 2023-09-09 LAB — POC COVID19 BINAXNOW: SARS Coronavirus 2 Ag: POSITIVE — AB

## 2023-09-09 MED ORDER — ALBUTEROL SULFATE HFA 108 (90 BASE) MCG/ACT IN AERS: 2.0000 | INHALATION_SPRAY | RESPIRATORY_TRACT | 2 refills | Status: AC | PRN

## 2023-09-09 MED ORDER — PREDNISONE 10 MG PO TABS: ORAL_TABLET | ORAL | 0 refills | Status: DC

## 2023-09-09 MED ORDER — AZITHROMYCIN 250 MG PO TABS: ORAL_TABLET | ORAL | 0 refills | Status: DC

## 2023-09-09 NOTE — Assessment & Plan Note (Signed)
 Onset worsening of chronic rhinitis spring 2020 follow by onset of chronic cough  - [facility] Allergy w/u  11/29/21 :   strongly Pos IgE 559 multiple pos RAST  dust mite, grass pollen, cockroach, molds, tree pollen and weed pollen    but skin testing neg > no better on allegra/ singulair, atrovent nasa spray - 06/05/2022   Walked on RA  x  3  lap(s) =  approx 750  ft  @ fast pace, stopped due to end of study  with lowest 02 sats 98% s sob /cough /cp   - FENO 06/05/2022  = 11 - 06/05/2022  After extensive coaching inhaler device,  effectiveness =   50% from baseline near 0  - rx max gerd rx plus 1st gen H1 blockers per guidelines  06/05/2022 >>> did not follow instructions  - 07/17/2022 depomedrol 120 mg IM and max 1st gen h1/hard rock candy to stifle urge to cough    Cough has waxed and waned for the last year overall better when uses max gerd rx but ? Adherence  "you tol me to take albuterol three times daily"    The standardized cough guidelines published in Chest by [place] in 2006 are still the best available and consist of a multiple step process (up to 12!) , not a single office visit,  and are intended  to address this problem logically,  with an alogrithm dependent on response to empiric treatment at  each progressive step  to determine a specific diagnosis with  minimal addtional testing needed. Therefore if adherence is an issue or can't be accurately verified,  it's very unlikely the standard evaluation and treatment will be successful here.    Furthermore, response to therapy (other than acute cough suppression, which should only be used short term with avoidance of narcotic containing cough syrups if possible), can be a gradual process for which the patient is not likely to  perceive immediate benefit.  Unlike going to an eye doctor where the best perscription is almost always the first one and is immediately effective, this is almost never the case in the management of chronic cough  syndromes. Therefore the patient needs to commit up front to consistently adhere to recommendations  for up to 6 weeks of therapy directed at the likely underlying problem(s) before the response can be reasonably evaluated.   F/u when allergy / ent eval rx complete with all meds in hand using a trust but verify approach to confirm accurate Medication  Reconciliation The principal here is that until we are certain that the  patients are doing what we've asked, it makes no sense to ask them to do more.

## 2023-09-09 NOTE — Patient Instructions (Addendum)
 Only use your albuterol as a rescue medication to be used if you can't catch your breath by resting or doing a relaxed purse lip breathing pattern.  - The less you use it, the better it will work when you need it. - Ok to use up to 2 puffs  every 4 hours if you must but call for immediate appointment if use goes up over your usual need - Don't leave home without it !!  (think of it like the spare tire for your car)   Zpak  Prednisone 10 mg take  4 each am x 2 days,   2 each am x 2 days,  1 each am x 2 days and stop   If getting worse [facility]   If not satisfied with your cough / breathing after you finish with your allergist /ent doctors return with all meds in hand using a trust but verify approach to confirm accurate Medication  Reconciliation The principal here is that until we are certain that the  patients are doing what we've asked, it makes no sense to ask them to do more.

## 2023-09-09 NOTE — Assessment & Plan Note (Signed)
 Exposure 08/08/22 with main symptoms rhinorrhea/ flare of chronic cough   Lab Results  Component Value Date   CREATININE 0.91 05/29/2023   CREATININE 0.81 01/22/2023   CREATININE 0.70 09/17/2022    She could probably take paxlovid if needed but she does not appear toxic at all today on exam and so rec go to UC if worse for the paxlovid protocol and in meatime try zpa/ prednisone x 6 days for mild flare of chronic rhinitis/ bronchitis       Each maintenance medication was reviewed in detail including emphasizing most importantly the difference between maintenance and prns and under what circumstances the prns are to be triggered using an action plan format where appropriate.  Total time for H and P, chart review, counseling, reviewing hfa  device(s) and generating customized AVS unique to this office visit / same day charting =  form chronic resp cc ? Etiology and new covid infection.

## 2023-09-23 ENCOUNTER — Encounter: Payer: Self-pay | Admitting: Internal Medicine

## 2023-09-29 ENCOUNTER — Other Ambulatory Visit: Payer: Medicare HMO

## 2023-09-29 DIAGNOSIS — E1169 Type 2 diabetes mellitus with other specified complication: Secondary | ICD-10-CM | POA: Diagnosis not present

## 2023-09-29 DIAGNOSIS — E785 Hyperlipidemia, unspecified: Secondary | ICD-10-CM | POA: Diagnosis not present

## 2023-09-30 ENCOUNTER — Encounter: Payer: Self-pay | Admitting: Internal Medicine

## 2023-09-30 LAB — LIPID PANEL
Chol/HDL Ratio: 3 ratio (ref 0.0–4.4)
Cholesterol, Total: 234 mg/dL — ABNORMAL HIGH (ref 100–199)
HDL: 77 mg/dL (ref >39)
LDL Chol Calc (NIH): 144 mg/dL — ABNORMAL HIGH (ref 0–99)
Triglycerides: 76 mg/dL (ref 0–149)
VLDL Cholesterol Cal: 13 mg/dL (ref 5–40)

## 2023-09-30 LAB — CMP14+EGFR
ALT: 12 IU/L (ref 0–32)
AST: 14 IU/L (ref 0–40)
Albumin: 4.1 g/dL (ref 3.8–4.8)
Alkaline Phosphatase: 101 IU/L (ref 44–121)
BUN/Creatinine Ratio: 15 (ref 12–28)
BUN: 12 mg/dL (ref 8–27)
Bilirubin Total: 0.4 mg/dL (ref 0.0–1.2)
CO2: 24 mmol/L (ref 20–29)
Calcium: 9.2 mg/dL (ref 8.7–10.3)
Chloride: 104 mmol/L (ref 96–106)
Creatinine, Ser: 0.81 mg/dL (ref 0.57–1.00)
Globulin, Total: 2.6 g/dL (ref 1.5–4.5)
Glucose: 108 mg/dL — ABNORMAL HIGH (ref 70–99)
Potassium: 4.2 mmol/L (ref 3.5–5.2)
Sodium: 143 mmol/L (ref 134–144)
Total Protein: 6.7 g/dL (ref 6.0–8.5)
eGFR: 74 mL/min/{1.73_m2} (ref >59)

## 2023-09-30 LAB — HEMOGLOBIN A1C
Est. average glucose Bld gHb Est-mCnc: 134 mg/dL
Hgb A1c MFr Bld: 6.3 % — ABNORMAL HIGH (ref 4.8–5.6)

## 2023-10-03 DIAGNOSIS — R0602 Shortness of breath: Secondary | ICD-10-CM | POA: Diagnosis not present

## 2023-10-03 DIAGNOSIS — R0600 Dyspnea, unspecified: Secondary | ICD-10-CM | POA: Diagnosis not present

## 2023-10-03 DIAGNOSIS — I10 Essential (primary) hypertension: Secondary | ICD-10-CM | POA: Diagnosis not present

## 2023-10-03 DIAGNOSIS — G4733 Obstructive sleep apnea (adult) (pediatric): Secondary | ICD-10-CM | POA: Diagnosis not present

## 2023-10-27 ENCOUNTER — Ambulatory Visit (INDEPENDENT_AMBULATORY_CARE_PROVIDER_SITE_OTHER): Payer: Self-pay | Admitting: Internal Medicine

## 2023-10-27 ENCOUNTER — Encounter: Payer: Self-pay | Admitting: Internal Medicine

## 2023-10-27 VITALS — BP 122/80 | HR 62 | Temp 98.2°F | Ht 63.0 in | Wt 221.4 lb

## 2023-10-27 DIAGNOSIS — R8290 Unspecified abnormal findings in urine: Secondary | ICD-10-CM | POA: Diagnosis not present

## 2023-10-27 DIAGNOSIS — J301 Allergic rhinitis due to pollen: Secondary | ICD-10-CM | POA: Diagnosis not present

## 2023-10-27 DIAGNOSIS — Z6839 Body mass index (BMI) 39.0-39.9, adult: Secondary | ICD-10-CM

## 2023-10-27 DIAGNOSIS — E041 Nontoxic single thyroid nodule: Secondary | ICD-10-CM | POA: Diagnosis not present

## 2023-10-27 DIAGNOSIS — E1169 Type 2 diabetes mellitus with other specified complication: Secondary | ICD-10-CM | POA: Diagnosis not present

## 2023-10-27 DIAGNOSIS — E66812 Obesity, class 2: Secondary | ICD-10-CM

## 2023-10-27 DIAGNOSIS — R053 Chronic cough: Secondary | ICD-10-CM

## 2023-10-27 DIAGNOSIS — E785 Hyperlipidemia, unspecified: Secondary | ICD-10-CM

## 2023-10-27 LAB — POCT URINALYSIS DIPSTICK
Bilirubin, UA: NEGATIVE
Glucose, UA: NEGATIVE
Ketones, UA: NEGATIVE
Nitrite, UA: NEGATIVE
Protein, UA: NEGATIVE
Spec Grav, UA: 1.025 (ref 1.010–1.025)
Urobilinogen, UA: 0.2 U/dL
pH, UA: 5.5 (ref 5.0–8.0)

## 2023-10-27 NOTE — Patient Instructions (Signed)

## 2023-10-27 NOTE — Progress Notes (Signed)
 I,Victoria T Basil Lim, CMA,acting as a Neurosurgeon for Smiley Dung, MD.,have documented all relevant documentation on the behalf of Smiley Dung, MD,as directed by  Smiley Dung, MD while in the presence of Smiley Dung, MD.  Subjective:  Patient ID: [name] , female    DOB: [birth year] , 78 y.o.   MRN: [id number]  Chief Complaint  Patient presents with   Cough    Patient presents today for cough this has been an ongoing chronic concern. She is still being actively treated for. She had a US  completed on 10/11. Thyroid  nodule found. She wants to know is this something she is to be treated for. Also if this contributes to her cough.     HPI Discussed the use of AI scribe software for clinical note transcription with the patient, who gave verbal consent to proceed.  History of Present Illness [name] is a 78 year old female who presents with concerns about thyroid -related symptoms and chronic cough.  She has h/o chronic cough and wants to know if her sx are due to her previous thyroid  issues.  She recalls being treated for thyroid  issues approximately ten years ago with medication, which she discontinued after the prescription ran out. A past ultrasound from 2013 indicated a small polyp on her thyroid , which she is concerned might be contributing to her symptoms. She does not recall any recent evaluations specifically addressing her thyroid .  She has a chronic cough originating from her throat, accompanied by an itching and tickling sensation. She is under the care of an allergist for this issue. She takes Singulair  (Montelukast ) consistently, with a recent refill, and also takes allergy  medication daily, such as Zyrtec, to manage symptoms. Her symptoms fluctuate, improving for a few days before worsening again. No difficulty swallowing, but she reports occasional choking due to drainage.  In terms of her social history, she consumes lactose milk in her cereal but tries to  limit her intake of other dairy products. She does not report a family history of thyroid  issues.   Past Medical History:  Diagnosis Date   Arthritis    hands   Bladder infection    Carpal tunnel syndrome    Complication of anesthesia    Difficult to arouse   Diabetes mellitus    GERD (gastroesophageal reflux disease)    Hypertension    Sleep apnea      Family History  Problem Relation Age of Onset   Diabetes Mother    Hypertension Mother    Asthma Father    Heart attack Father 11   Stroke Father 25   Diabetes Father    Breast cancer Sister 37   Allergic rhinitis Brother    Diabetes Brother    Diabetes Brother    Atopy Paternal Grandmother      Current Outpatient Medications:    albuterol  (VENTOLIN  HFA) 108 (90 Base) MCG/ACT inhaler, Inhale 2 puffs into the lungs every 4 (four) hours as needed for wheezing or shortness of breath., Disp: 18 g, Rfl: 2   Ascorbic Acid  (VITAMIN C) 1000 MG tablet, Take 1,000 mg by mouth daily., Disp: , Rfl:    ASPIRIN  81 PO, , Disp: , Rfl:    aspirin  EC 81 MG tablet, Take 1 tablet (81 mg total) by mouth 2 (two) times daily., Disp: 60 tablet, Rfl: 0   Azelastine  HCl 0.15 % SOLN, Place 2 sprays into the nose daily., Disp: 30 mL, Rfl: 3   azithromycin  (ZITHROMAX )  250 MG tablet, Take 2 on day one then 1 daily x 4 days, Disp: 6 tablet, Rfl: 0   Bacillus Coagulans-Inulin (PROBIOTIC FORMULA PO), Take 1 capsule by mouth 2 (two) times a week., Disp: , Rfl:    carvedilol  (COREG ) 3.125 MG tablet, Take 1 tablet (3.125 mg total) by mouth 2 (two) times daily., Disp: 180 tablet, Rfl: 1   Cholecalciferol  (VITAMIN D ) 50 MCG (2000 UT) tablet, Take 2,000 Units by mouth daily., Disp: , Rfl:    clotrimazole -betamethasone  (LOTRISONE ) cream, Apply topically 2 (two) times daily as needed., Disp: 30 g, Rfl: 1   Coenzyme Q10 (COQ10) 100 MG CAPS, Take 100 mg by mouth daily with lunch. , Disp: , Rfl:    EPINEPHrine  0.3 mg/0.3 mL IJ SOAJ injection, Inject 0.3 mg into the  muscle as needed for anaphylaxis., Disp: 1 each, Rfl: 2   estradiol  (ESTRACE ) 0.1 MG/GM vaginal cream, Place 1 Applicatorful vaginally daily as needed (dryness)., Disp: , Rfl:    famotidine  (PEPCID ) 20 MG tablet, One after supper, Disp: 90 tablet, Rfl: 3   fluticasone  (FLONASE ) 50 MCG/ACT nasal spray, Place 1 spray into both nostrils daily as needed for allergies or rhinitis., Disp: , Rfl:    furosemide  (LASIX ) 20 MG tablet, Take 1 tablet (20 mg total) by mouth daily as needed (fluid)., Disp: 90 tablet, Rfl: 1   Lancets (ONETOUCH DELICA PLUS LANCET33G) MISC, USE 1  TO CHECK GLUCOSE TWICE DAILY, BEFORE BREAKFAST AND DINNER, Disp: 100 each, Rfl: 0   metFORMIN  (GLUCOPHAGE ) 500 MG tablet, TAKE 1 TABLET BY MOUTH TWICE DAILY WITH A MEAL, Disp: 180 tablet, Rfl: 0   montelukast  (SINGULAIR ) 10 MG tablet, Take 1 tablet (10 mg total) by mouth at bedtime., Disp: 30 tablet, Rfl: 3   nystatin  (MYCOSTATIN /NYSTOP ) powder, Apply 1 Application topically 2 (two) times daily. To affected area(s), Disp: 60 g, Rfl: 1   nystatin  cream (MYCOSTATIN ), APPLY  CREAM TOPICALLY TWICE DAILY AS NEEDED, Disp: 30 g, Rfl: 0   ONETOUCH VERIO test strip, USE 1 STRIP TO CHECK GLUCOSE TWICE DAILY, Disp: 100 each, Rfl: 0   OXYGEN , Inhale 1 L into the lungs at bedtime as needed (oxygen )., Disp: , Rfl:    pantoprazole  (PROTONIX ) 40 MG tablet, Take 1 tablet (40 mg total) by mouth daily., Disp: 30 tablet, Rfl: 5   Potassium Chloride  (KLOR-CON  PO), , Disp: , Rfl:    potassium chloride  SA (KLOR-CON  M) 20 MEQ tablet, One tab po every day prn, with each dose of furosemide ., Disp: 90 tablet, Rfl: 1   predniSONE  (DELTASONE ) 10 MG tablet, Take  4 each am x 2 days,   2 each am x 2 days,  1 each am x 2 days and stop, Disp: 14 tablet, Rfl: 0   rosuvastatin  (CRESTOR ) 20 MG tablet, Take 20 mg by mouth daily., Disp: , Rfl:    valsartan -hydrochlorothiazide  (DIOVAN -HCT) 80-12.5 MG tablet, Take 1 tablet by mouth daily., Disp: , Rfl:    Allergies  Allergen  Reactions   Pravastatin  Hives   Haemophilus Influenzae Vaccines     Nausea, pain, body weakness   Sulfa Antibiotics Itching   Sulfonamide Derivatives Itching   Tetanus Toxoid, Adsorbed Other (See Comments)   Tetanus Toxoids Swelling    Arm swelled at injection site     Review of Systems  Constitutional: Negative.   HENT:  Positive for ear pain.   Respiratory: Negative.    Cardiovascular: Negative.   Gastrointestinal: Negative.   Neurological: Negative.   Psychiatric/Behavioral: Negative.  Today's Vitals   10/27/23 1059  BP: 122/80  Pulse: 62  Temp: 98.2 F (36.8 C)  SpO2: 98%  Weight: 221 lb 6.4 oz (100.4 kg)  Height: 5\' 3"  (1.6 m)   Body mass index is 39.22 kg/m.  Wt Readings from Last 3 Encounters:  10/27/23 221 lb 6.4 oz (100.4 kg)  09/09/23 219 lb (99.3 kg)  08/06/23 221 lb (100.2 kg)     Objective:  Physical Exam Vitals and nursing note reviewed.  Constitutional:      Appearance: Normal appearance.  HENT:     Head: Normocephalic and atraumatic.     Right Ear: Tympanic membrane, ear canal and external ear normal. There is no impacted cerumen.     Left Ear: Tympanic membrane, ear canal and external ear normal. There is no impacted cerumen.  Eyes:     Extraocular Movements: Extraocular movements intact.  Cardiovascular:     Rate and Rhythm: Normal rate and regular rhythm.     Heart sounds: Normal heart sounds.  Pulmonary:     Effort: Pulmonary effort is normal.     Breath sounds: Normal breath sounds.  Musculoskeletal:     Cervical back: Normal range of motion.  Skin:    General: Skin is warm.  Neurological:     General: No focal deficit present.     Mental Status: She is alert.  Psychiatric:        Mood and Affect: Mood normal.        Behavior: Behavior normal.         Assessment And Plan:  Chronic cough Assessment & Plan: Chronic cough likely due to post-nasal drainage from allergies. Thyroid  function normal. - Continue Singulair   (Montelukast ). - Maintain daily Zyrtec. - Eliminate dairy products.   Seasonal allergic rhinitis due to pollen Assessment & Plan: Allergic rhinitis causing post-nasal drainage and cough. Intermittent relief with allergy  medications. - Continue daily Zyrtec. - Avoid dairy products.   Thyroid  nodule Assessment & Plan: She would like to move forward with imaging of thyroid  gland. She agrees to thyroid  u/s.   Orders: -     TSH + free T4 -     US  THYROID ; Future  Dyslipidemia associated with type 2 diabetes mellitus (HCC) Assessment & Plan: Chronic, she will rto in 2 months for next scheduled diabetes check. Importance of medication compliance was discussed with the patient.   Orders: -     Microalbumin / creatinine urine ratio -     POCT urinalysis dipstick  Abnormal urine -     Urine Culture  Class 2 severe obesity due to excess calories with serious comorbidity and body mass index (BMI) of 39.0 to 39.9 in adult [facility]) Assessment & Plan: She is encouraged to strive for BMI<30 to decrease cardiac risk. Advised to aim for at least 150 minutes of exercise per week.       Return if symptoms worsen or fail to improve.  Patient was given opportunity to ask questions. Patient verbalized understanding of the plan and was able to repeat key elements of the plan. All questions were answered to their satisfaction.   I, Smiley Dung, MD, have reviewed all documentation for this visit. The documentation on 10/27/23 for the exam, diagnosis, procedures, and orders are all accurate and complete.   IF YOU HAVE BEEN REFERRED TO A SPECIALIST, IT MAY TAKE 1-2 WEEKS TO SCHEDULE/PROCESS THE REFERRAL. IF YOU HAVE NOT HEARD FROM US /SPECIALIST IN TWO WEEKS, PLEASE GIVE US  A CALL AT  [phone] X [phone].   THE PATIENT IS ENCOURAGED TO PRACTICE SOCIAL DISTANCING DUE TO THE COVID-19 PANDEMIC.

## 2023-10-28 NOTE — Assessment & Plan Note (Signed)
 She is encouraged to strive for BMI<30 to decrease cardiac risk. Advised to aim for at least 150 minutes of exercise per week.

## 2023-10-28 NOTE — Assessment & Plan Note (Signed)
 She would like to move forward with imaging of thyroid  gland. She agrees to thyroid  u/s.

## 2023-10-28 NOTE — Assessment & Plan Note (Signed)
 Chronic, she will rto in 2 months for next scheduled diabetes check. Importance of medication compliance was discussed with the patient.

## 2023-10-28 NOTE — Assessment & Plan Note (Signed)
 Chronic cough likely due to post-nasal drainage from allergies. Thyroid  function normal. - Continue Singulair  (Montelukast ). - Maintain daily Zyrtec. - Eliminate dairy products.

## 2023-10-28 NOTE — Assessment & Plan Note (Signed)
 Allergic rhinitis causing post-nasal drainage and cough. Intermittent relief with allergy  medications. - Continue daily Zyrtec. - Avoid dairy products.

## 2023-10-29 LAB — URINE CULTURE

## 2023-10-29 LAB — MICROALBUMIN / CREATININE URINE RATIO
Creatinine, Urine: 95 mg/dL
Microalb/Creat Ratio: <3 mg/g{creat} (ref 0–29)
Microalbumin, Urine: <3 ug/mL

## 2023-11-02 DIAGNOSIS — R0600 Dyspnea, unspecified: Secondary | ICD-10-CM | POA: Diagnosis not present

## 2023-11-02 DIAGNOSIS — R0602 Shortness of breath: Secondary | ICD-10-CM | POA: Diagnosis not present

## 2023-11-02 DIAGNOSIS — G4733 Obstructive sleep apnea (adult) (pediatric): Secondary | ICD-10-CM | POA: Diagnosis not present

## 2023-11-02 DIAGNOSIS — I10 Essential (primary) hypertension: Secondary | ICD-10-CM | POA: Diagnosis not present

## 2023-11-06 ENCOUNTER — Encounter (INDEPENDENT_AMBULATORY_CARE_PROVIDER_SITE_OTHER): Payer: Self-pay | Admitting: Otolaryngology

## 2023-11-06 ENCOUNTER — Ambulatory Visit (INDEPENDENT_AMBULATORY_CARE_PROVIDER_SITE_OTHER): Payer: Medicare HMO | Admitting: Otolaryngology

## 2023-11-06 VITALS — BP 148/81 | HR 67

## 2023-11-06 DIAGNOSIS — J343 Hypertrophy of nasal turbinates: Secondary | ICD-10-CM | POA: Diagnosis not present

## 2023-11-06 DIAGNOSIS — R0982 Postnasal drip: Secondary | ICD-10-CM | POA: Diagnosis not present

## 2023-11-06 DIAGNOSIS — J3089 Other allergic rhinitis: Secondary | ICD-10-CM

## 2023-11-06 DIAGNOSIS — J342 Deviated nasal septum: Secondary | ICD-10-CM

## 2023-11-06 DIAGNOSIS — K219 Gastro-esophageal reflux disease without esophagitis: Secondary | ICD-10-CM

## 2023-11-06 DIAGNOSIS — R053 Chronic cough: Secondary | ICD-10-CM | POA: Diagnosis not present

## 2023-11-06 DIAGNOSIS — G4733 Obstructive sleep apnea (adult) (pediatric): Secondary | ICD-10-CM

## 2023-11-06 DIAGNOSIS — H9201 Otalgia, right ear: Secondary | ICD-10-CM | POA: Diagnosis not present

## 2023-11-06 DIAGNOSIS — H6993 Unspecified Eustachian tube disorder, bilateral: Secondary | ICD-10-CM

## 2023-11-06 DIAGNOSIS — R0981 Nasal congestion: Secondary | ICD-10-CM | POA: Diagnosis not present

## 2023-11-06 DIAGNOSIS — R6884 Jaw pain: Secondary | ICD-10-CM

## 2023-11-06 MED ORDER — IPRATROPIUM BROMIDE 0.03 % NA SOLN: 2.0000 | Freq: Two times a day (BID) | NASAL | 12 refills | Status: DC

## 2023-11-06 MED ORDER — LEVOCETIRIZINE DIHYDROCHLORIDE 5 MG PO TABS: 5.0000 mg | ORAL_TABLET | Freq: Every evening | ORAL | 3 refills | Status: DC

## 2023-11-06 MED ORDER — FLUTICASONE PROPIONATE 50 MCG/ACT NA SUSP: 1.0000 | Freq: Every day | NASAL | 3 refills | Status: AC | PRN

## 2023-11-06 MED ORDER — TRAMADOL HCL 50 MG PO TABS: 50.0000 mg | ORAL_TABLET | Freq: Every day | ORAL | 1 refills | Status: AC

## 2023-11-06 NOTE — Patient Instructions (Addendum)
 [name] Med Nasal Saline Rinse   - start nasal saline rinses with [employer]Med Bottle available over the counter or online to help with nasal congestion      Stop Allegra and Start Xyzal  Add Atrovent  nasal spray to Flonase   See Allergy  Start Tramadol  at night for cough Return in 6 months  See your dentist for jaw pain     TMJ (Temporomandibular Joint Syndrome) The temporomandibular (tem-puh-roe-man-DIB-u-lur) joint (TMJ) acts like a sliding hinge, connecting your jawbone to your skull. You have one joint on each side of your jaw. TMJ disorders -- a type of temporomandibular disorder or TMD -- can cause pain in your jaw joint and in the muscles that control jaw movement.  The exact cause of a person's TMJ disorder is often difficult to determine. Your pain may be due to a combination of factors, such as genetics, arthritis or jaw injury. Some people who have jaw pain also tend to clench or grind their teeth (bruxism), although many people habitually clench or grind their teeth and never develop TMJ disorders.  In most cases, the pain and discomfort associated with TMJ disorders is temporary and can be relieved with self-managed care or nonsurgical treatments. This includes stress reduction, softer diet when the pain is present, anti-inflammatory pain medications such as Motrin and warm compresses.

## 2023-11-06 NOTE — Progress Notes (Signed)
 ENT Progress Note:   Update 11/06/2023  Discussed the use of AI scribe software for clinical note transcription with the patient, who gave verbal consent to proceed.  History of Present Illness [name] is a 78 year old female who presents with chronic nasal congestion and chronic cough. She also repots right sided jaw pain and otalgia.   She experiences persistent nasal congestion and a chronic cough that have not improved despite treatment with Allegra, Flonase , and Singulair . The cough is productive with white, thick mucus and is particularly severe at night, though it can occur unpredictably during the day. She has undergone allergy  testing but has not pursued allergy  shots.  She has been experiencing a severe right-sided earache for about two weeks, described as causing a tickle, itch, and cough sensation, with tenderness extending from her ear to the side of her jaw. This is associated with a sensation of facial discomfort on the same side.  Her current medications include Allegra, Flonase , Pepcid , and Protonix . She attempted to use a reflux paste but discontinued it due to excessive gas. She continues to use a CPAP machine for sleep apnea.  No significant headache or temple pain, but she notes jaw-related discomfort. She has a history of dental fillings, which she wonders might be contributing to her symptoms.  Records Reviewed:  Initial Evaluation    Update 05/08/23: she returns after neck soft tissue U/S which showed small non-enlarged lymph nodes c/w reactive lymphadenopathy. She reports sx improvement and taking allergy  medications and GERD medications. Have not tried Reflux Gourmet. Neck discomfort resolved once her cough improved.   Update 03/27/23: She returns after hearing evaluation, which showed symmetric bilateral sensorineural hearing loss with excellent word recognition score. She is on Flonase  and Allegra/Singulair .  She reports persistent cough with clear mucus.   She noticed some discomfort when she coughs along the right superior neck near the border of the lower jaw and superior aspect of SCM, only bothering her when she coughs forcefully.  She has been taking reflux medications.  Continues to have some ear pressure and muffled hearing on the right from time to time.   Audiogram results  Tympanometry results were consistent with normal middle ear function, bilaterally   Audiometric testing was completed using conventional audiometry with supraural transducer. Speech Recognition Thresholds were 15dB in the right ear and 10dB in the left ear. Word Recognition was performed 40dB SL, scored 100% in the right ear and 100% in the left ear. Pure tone thresholds show normal hearing 250-6kHz sloping to a mild hearing loss 8kHz only bilaterally.   Initial Evaluation 12/25/2022:  Reason for Consult: chronic cough    Referring Physician:  Eulas Hick, NP [facility]  HPI: [name] is an 78 y.o. female with hx HTN, T2DM, OSA (stopped using CPAP 2/2 poor tolerance), BMI of 37, nocturnal hypoxemia, on O2 at night, GERD, hiatal hernia, chronic cough for over a year, is here for initial evaluation with us . She reports post-nasal drip and congestion, had positive RAST. She had negative CXR 05/2022.  She was seen by Cards 11/14/22 and is f/b them, near normal EF when underwent testing.   She is on Vansartan-hydrochlorothiazide  combo medication. She is on Pantoprazole  40 mg am and Famotidine  20 mg for GERD.   Cough is productive with clear mucus, coughs at night and during the day.   She is on Flonase  BID. She is on Albuterol . She has dyspnea on exertion and was given albuterol  TID daily -  it helps with dyspnea. She is on OTC Allegra. Both did not help with cough. She saw GI several years ago and was diagnosed with small hiatal hernia based on EGD.  Never smoked. Denies hx of lung disease.   Records Reviewed:  Pending CT chest w/o contrast and CT Max/Face and  scheduled for 12/30/2022  Note by Eulas Hick, NP 12/10/2022 "78 year old female, never smoked.  Past medical history significant for cough variant asthma, chronic rhinitis.  Patient of Dr. Waymond Hailey, last seen in office on 08/14/2022.   Onset worsening of chronic rhinitis spring 2020 follow by onset of chronic cough  - [facility]  w/u  11/29/21 :   strongly Pos IgE 559 multiple pos RAST  dust mite, grass pollen, cockroach, molds, tree pollen and weed pollen    but skin testing neg > no better on allegra/ singulair , atrovent  nasa spray - 06/05/2022   Walked on RA  x  3  lap(s) =  approx 750  ft  @ fast pace, stopped due to end of study  with lowest 02 sats 98% s sob /cough /cp   - FENO 06/05/2022  = 11      On Atrovent /Albuterol  and PPI (Protonix  40 mg daily) On Azelastine   Chronic cough for >2 years  No better on Allegra/Singulair  or Atrovent  CXR in December was clear Needs CT imaging of chest and sinuses Referring to ENT   ALLERGIC RHINITIS CAUSE UNSPECIFIED - Continue over-the-counter antihistamine and Atrovent  nasal spray - Strongly positive allergy  testing in June 2023/ FENO normal 06/05/22 - May want to discuss allergy  shots/biologics at follow-up      12/10/2022- interim hx  Presents today for acute office visit. Patient of Dr. Waymond Hailey, last seen in February 2024 for chronic rhinitis and chronic cough. Not felt to be true asthma.  Allergy  testing positive in June 2023.  Recommended Max GERD treatment + h1 blocker pepcid  20mg  at bedtime / mucinex dm - refer to [facility] voice center if not improved with above.   She had a flare up three weeks ago where she reports having increased sob and productive cough. Associated sneezing and running nose. Symptoms have eased up. Cough is her biggest complaint. Cough is productive with clear mucus. She is taking OTC antihistamine and Atrovent  nasal spray as directed. Reports no significant improvement from Singulair  or when taking prednisone . She uses  albuterol  three times a day. Needs refill of Albuterol , nasal spray and protonix .       Past Medical History:  Diagnosis Date   Arthritis    hands   Bladder infection    Carpal tunnel syndrome    Complication of anesthesia    Difficult to arouse   Diabetes mellitus    GERD (gastroesophageal reflux disease)    Hypertension    Sleep apnea     Past Surgical History:  Procedure Laterality Date   ABDOMINAL HYSTERECTOMY  1985   partial   APPENDECTOMY     age 16   CARPAL TUNNEL RELEASE Bilateral 1980   cataract surgery Right 07/07/2020   Dr. Candi Chafe   cataract surgery Left 12/2020   HERNIA REPAIR  1990   TONSILLECTOMY     as a child   TOTAL KNEE ARTHROPLASTY Right 04/19/2019   Procedure: RIGHT TOTAL KNEE ARTHROPLASTY;  Surgeon: Wendolyn Hamburger, MD;  Location: [facility];  Service: Orthopedics;  Laterality: Right;   TOTAL KNEE ARTHROPLASTY Left 05/21/2021   Procedure: LEFT TOTAL KNEE ARTHROPLASTY;  Surgeon: Wendolyn Hamburger, MD;  Location: [facility];  Service: Orthopedics;  Laterality: Left;    Family History  Problem Relation Age of Onset   Diabetes Mother    Hypertension Mother    Asthma Father    Heart attack Father 21   Stroke Father 51   Diabetes Father    Breast cancer Sister 12   Allergic rhinitis Brother    Diabetes Brother    Diabetes Brother    Atopy Paternal Grandmother     Social History:  reports that she has never smoked. She has never been exposed to tobacco smoke. She has never used smokeless tobacco. She reports that she does not drink alcohol and does not use drugs.  Allergies:  Allergies  Allergen Reactions   Pravastatin  Hives   Haemophilus Influenzae Vaccines     Nausea, pain, body weakness   Sulfa Antibiotics Itching   Sulfonamide Derivatives Itching   Tetanus Toxoid, Adsorbed Other (See Comments)   Tetanus Toxoids Swelling    Arm swelled at injection site    Medications: I have reviewed the patient's current medications.  No results found for this or  any previous visit (from the past 48 hours).  The PMH, PSH, Medications, Allergies, and SH were reviewed and updated.  ROS: Constitutional: Negative for fever, weight loss and weight gain. Cardiovascular: Negative for chest pain and dyspnea on exertion. Respiratory: Is not experiencing shortness of breath at rest. (+) chronic cough  Gastrointestinal: Negative for nausea and vomiting. Neurological: Negative for headaches. Psychiatric: The patient is not nervous/anxiou  Blood pressure (!) 148/81, pulse 67, SpO2 96%.  PHYSICAL EXAM:  Blood pressure (!) 148/81, pulse 67, SpO2 96%. There is no height or weight on file to calculate BMI.  PHYSICAL EXAM:  Exam: General: Well-developed, well-nourished Communication and Voice: Clear pitch and clarity Respiratory Respiratory effort: Equal inspiration and expiration without stridor Cardiovascular Peripheral Vascular: Warm extremities with equal color/perfusion Eyes: No nystagmus with equal extraocular motion bilaterally Neuro/Psych/Balance: Patient oriented to person, place, and time; Appropriate mood and affect; Gait is intact with no imbalance; Cranial nerves I-XII are intact Head and Face Inspection: Normocephalic and atraumatic without mass or lesion Palpation: Facial skeleton intact without bony stepoffs Salivary Glands: No mass or tenderness Facial Strength: Facial motility symmetric and full bilaterally ENT Pinna: External ear intact and fully developed External canal: Canal is patent with intact skin Tympanic Membrane: Clear and mobile External Nose: No scar or anatomic deformity Internal Nose: Septum is S-shaped with significant narrowing of R > L nasal passages. No polyp, or purulence. Mucosal edema and erythema present.  Bilateral inferior turbinate hypertrophy.  Lips, Teeth, and gums: Mucosa and teeth intact and viable TMJ: No pain to palpation with full mobility Oral cavity/oropharynx: No erythema or exudate, no lesions  present Nasopharynx: No mass or lesion with intact mucosa Hypopharynx: Intact mucosa without pooling of secretions Larynx Glottic: Full true vocal cord mobility without lesion or mass Supraglottic: Normal appearing epiglottis and AE folds Interarytenoid Space: Moderate pachydermia&edema Subglottic Space: Patent without lesion or edema Neck Neck and Trachea: Midline trachea without mass or lesion Thyroid : No mass or nodularity Lymphatics: No lymphadenopathy  Preoperative diagnosis: chronic cough and GERD LPR  Postoperative diagnosis:   Same  Procedure: Flexible fiberoptic laryngoscopy  Surgeon: Artice Last, MD  Anesthesia: Topical lidocaine  and Afrin Complications: None Condition is stable throughout exam  Indications and consent:  The patient presents to the clinic with above symptoms. Indirect laryngoscopy view was incomplete. Thus it was recommended that they undergo a flexible fiberoptic laryngoscopy. All of  the risks, benefits, and potential complications were reviewed with the patient preoperatively and verbal informed consent was obtained.  Procedure: The patient was seated upright in the clinic. Topical lidocaine  and Afrin were applied to the nasal cavity. After adequate anesthesia had occurred, I then proceeded to pass the flexible telescope into the nasal cavity. The nasal cavity was patent without rhinorrhea or polyp. The nasopharynx was also patent without mass or lesion. The base of tongue was visualized and was normal. There were no signs of pooling of secretions in the piriform sinuses. The true vocal folds were mobile bilaterally. There were no signs of glottic or supraglottic mucosal lesion or mass. There was moderate interarytenoid pachydermia and post cricoid edema. The telescope was then slowly withdrawn and the patient tolerated the procedure throughout.    PROCEDURE NOTE: nasal endoscopy  Preoperative diagnosis: chronic nasal congestion  symptoms  Postoperative diagnosis: same  Procedure: Diagnostic nasal endoscopy (29562)  Surgeon: Artice Last, M.D.  Anesthesia: Topical lidocaine  and Afrin  H&P REVIEW: The patient's history and physical were reviewed today prior to procedure. All medications were reviewed and updated as well. Complications: None Condition is stable throughout exam Indications and consent: The patient presents with symptoms of chronic sinusitis not responding to previous therapies. All the risks, benefits, and potential complications were reviewed with the patient preoperatively and informed consent was obtained. The time out was completed with confirmation of the correct procedure.   Procedure: The patient was seated upright in the clinic. Topical lidocaine  and Afrin were applied to the nasal cavity. After adequate anesthesia had occurred, the rigid nasal endoscope was passed into the nasal cavity. The nasal mucosa, turbinates, septum, and sinus drainage pathways were visualized bilaterally. This revealed no purulence or significant secretions that might be cultured. There were no  polyps or sites of significant inflammation. The mucosa was intact and there was no crusting present. The scope was then slowly withdrawn and the patient tolerated the procedure well. There were no complications or blood loss.   Studies Reviewed: CXR - negative (done 05/2022)  CT sinuses done 12/30/22 FINDINGS: Paranasal sinuses:   Frontal: Normally aerated. Patent frontal sinus drainage pathways.   Ethmoid: Normally aerated.   Maxillary: Normally aerated. There is a polyp or mucous retention cysts in the inferior left maxillary sinus measuring 13 x 8 by 9 mm.   Sphenoid: Normally aerated. Patent sphenoethmoidal recesses.   Right ostiomeatal unit: Patent.   Left ostiomeatal unit: Patent.   Nasal passages: Patent. Intact nasal septum is midline.   Other: Orbits and intracranial compartment are unremarkable.  Visible mastoid air cells are normally aerated.   IMPRESSION: 1. Normally aerated paranasal sinuses. Patent sinus drainage pathways. 2. Small polyp or mucous retention cyst in the inferior left maxillary sinus measuring 13 x 8 x 9 mm.  12/30/22 CT Chest  IMPRESSION: 1. No acute abnormality. 2. Mild linear atelectasis/scarring at both lung bases. 3. 4 mm nonobstructing right renal calculus. 4.  Calcific coronary artery and aortic atherosclerosis.  Assessment/Plan: Encounter Diagnoses  Name Primary?   Otalgia of right ear Yes   Jaw pain    Chronic cough    Chronic nasal congestion    Hypertrophy of both inferior nasal turbinates    Post-nasal drip    Chronic GERD    Dysfunction of both eustachian tubes    Environmental and seasonal allergies    OBSTRUCTIVE SLEEP APNEA    Nasal septal deviation      78 y.o. female with hx HTN,  T2DM, OSA (stopped using CPAP 2/2 poor tolerance), BMI of 37, nocturnal hypoxemia, on O2 at night, GERD, hiatal hernia, chronic cough for over a year, is here for initial evaluation with us . She reports post-nasal drip and congestion, had positive RAST. She had negative CXR 05/2022, and f/b Pulm. Exam including flexible laryngoscopy without lesions or masses, mild post-cricoid edema c/w GERD and nasal mucosal edema/ITH c/w allergies/PND.   - I discussed common etiologies of chronic cough including GERD/allergies and PND and lung disease with the patient and explained that in most cases the cause of chronic cough is multi-factorial - we discussed that she needs to ask her PCP about an alternative to hydrochlorothiazide  for HTN since this could be causing her cough sx - start Azelastine  and continue Flonase   - continue Allegra or change to a different antihistamine - start Singulair  in addition to antihistamine - start alginates ([employer] supplement) and continue two other reflux medications for now - will try to wean if cough improves if she tolerates - return  in 3 months for sx check and imaging review  - will consider dx of neurogenic cough in the future (will offer medical management or SLN Block) - she endorses decreased hearing on the right side, gradually worse over time - will order screening Audiogram, normal ear exam today (likely ETD vs age related hearing loss)  Update 03/27/23 She returns after hearing evaluation, which showed symmetric bilateral sensorineural hearing loss with excellent word recognition score. She is on Flonase  and Allegra/Singulair .  She reports persistent cough with clear mucus.  She noticed some discomfort when she coughs along the right superior neck near the border of the lower jaw and superior aspect of SCM, only bothering her when she coughs forcefully.  She has been taking reflux medications.  Continues to have some ear pressure and muffled hearing on the right from time to time. Pain along the right SCM/neck when coughing with palpable lymph node on exam today  - Soft tissue neck U/S to see if reactive lymphadenopathy - will consider CT neck in the future if symptoms persist and soft tissue ultrasound findings are concerning to warrant additional imaging -I also discussed with the patient that the pain discomfort could be musculoskeletal or related to cervical spine  2. Right ear pressure, muffled hearing -likely related to eustachian tube dysfunction no masses in the nasopharynx during the flexible scope exam on initial evaluation and audiogram with mild symmetric bilateral sensorineural hearing loss in high frequencies consistent with age-related hearing loss -Continue with Allegra 180 mg daily and azelastine  2 puffs bilateral nares twice daily -Continue Singulair  10 mg 3. GERD/LPR hx of hiatal hernia -Continue Pepcid  20 mg daily and Protonix  40 mg daily 4.  Chronic cough She reports post-nasal drip and congestion, had positive RAST. She had negative CXR 05/2022. Exam including flexible laryngoscopy without lesions or  masses, mild post-cricoid edema c/w GERD and nasal mucosal edema/ITH c/w allergies/PND. CT chest and sinus CT done 12/30/22 which I reviewed, and revealed small pulmonary nodule and minimal atelectasis/scarring, and well-aerated paranasal sinuses with small mucus retention cyst in the left maxillary sinuses.   -discussed common etiologies of chronic cough including GERD/allergies and PND and lung disease with the patient and explained that in most cases the cause of chronic cough is multi-factorial - we discussed that she needs to ask her PCP about an alternative to hydrochlorothiazide  for HTN since this could be causing her cough sx -Medical management of postnasal drainage with Allegra Singulair  and  nasal sprays including Flonase  and azelastine  - start alginates ([employer] supplement) and continue two other reflux medications for now - will try to wean if cough improves if she tolerates - will consider dx of neurogenic cough in the future (will offer medical management or SLN Block)  RTC in a few weeks for sx check and imaging review   Update 05/08/23  Neck U/S done 04/04/23 - reviewed with pt IMPRESSION: Sonographic evaluation of the right neck demonstrates numerous nonenlarged lymph nodes, which are most likely inflammatory. If the patient's symptoms persist or worsen, further evaluation with contrast enhanced soft tissue neck CT should be considered.  Her sx are better 75% - cough is not as bad and she is coughing up less mucus. Her ear pressure is better. She is on Allegra and Flonase  and did not start Reflux Gourmet, on reflux medication.    Pain along the right SCM/neck when coughing - improved after cough got better - Soft tissue neck U/S with reactive lymphadenopathy  - exam without palpable nodes today - if sx recur will consider repeat neck U/S in 6 months  - she will return in 6 months    2. Right ear pressure, muffled hearing -likely related to eustachian tube dysfunction no  masses in the nasopharynx during the flexible scope exam on initial evaluation and audiogram with mild symmetric bilateral sensorineural hearing loss in high frequencies consistent with age-related hearing loss -Continue with Allegra 180 mg daily and azelastine  2 puffs bilateral nares twice daily -Continue Singulair  10 mg  3. GERD/LPR hx of hiatal hernia -Continue Pepcid  20 mg daily and Protonix  40 mg daily - trial of Reflux Gourmet  4.  Chronic cough She reports post-nasal drip and congestion, had positive RAST. She had negative CXR 05/2022. Exam including flexible laryngoscopy without lesions or masses, mild post-cricoid edema c/w GERD and nasal mucosal edema/ITH c/w allergies/PND. CT chest and sinus CT done 12/30/22 which I reviewed, and revealed small pulmonary nodule and minimal atelectasis/scarring, and well-aerated paranasal sinuses with small mucus retention cyst in the left maxillary sinuses.   -discussed common etiologies of chronic cough including GERD/allergies and PND and lung disease with the patient and explained that in most cases the cause of chronic cough is multi-factorial - we discussed that she needs to ask her PCP about an alternative to hydrochlorothiazide  for HTN since this could be causing her cough sx -Medical management of postnasal drainage with Allegra Singulair  and nasal sprays including Flonase  and azelastine  - start alginates ([employer] supplement) and continue two other reflux medications for now - will try to wean if cough improves if she tolerates  She will return in 6 months   Update 11/06/2023 Assessment and Plan Assessment & Plan Chronic Nasal congestion nasal obstruction post-nasal drainage suspected environmental allergies  Persistent nasal congestion with evidence of  deviated septum/ITH and clear rhinorrhea, but no pus or purulence. Declined surgical correction of septum/ITH. She wears CPAP and we discussed this could exacerbate her nasal sx. Unclear if  candidate for allergy  shots, has not seen Allergy  since 2023 per records, will send a referral  - Prescribed Xyzal to replace Allegra. - Continue Flonase . - Prescribed Atrovent  nasal spray for nighttime use. - Recommend nasal saline rinses. - Allergy  consult   Chronic cough with postnasal drainage and hx of GERD LPR I discussed common etiologies of chronic cough including GERD/allergies and PND and lung disease with the patient and explained that in most cases the cause of chronic cough is multi-factorial. Ct chest  12/2022 was negative (ordered for persistent cough) Chronic cough with postnasal drainage and reflux changes on scope exam. Previous treatments caused gas and were discontinued. Tramadol  considered for nerve sensitivity. - Prescribed tramadol  50 mg at night to see if neurogenic component of cough can be managed on this medication - Continue Pepcid  and Protonix . - Refer to allergy  specialist and medical management of post-nasal drainage as above.  Right Jaw-related pain right otalgia Right-sided earache with facial discomfort, likely jaw-related. Possible dental etiology. - Recommend softer foods. Tramadol  with help with that as well  - Suggest dental/OMFS evaluation if pain persists.  Allergy  referral  RTC 6 months   Artice Last, MD Otolaryngology [facility] Phone: [phone] Fax: [phone]   11/06/2023, 11:53 AM

## 2023-11-27 ENCOUNTER — Ambulatory Visit
Admission: RE | Admit: 2023-11-27 | Discharge: 2023-11-27 | Disposition: A | Discharge status: Home / Self Care | Source: Ambulatory Visit | Attending: Internal Medicine | Admitting: Internal Medicine

## 2023-11-27 DIAGNOSIS — E041 Nontoxic single thyroid nodule: Secondary | ICD-10-CM

## 2023-11-27 DIAGNOSIS — R221 Localized swelling, mass and lump, neck: Secondary | ICD-10-CM | POA: Diagnosis not present

## 2023-12-01 ENCOUNTER — Ambulatory Visit: Payer: Self-pay | Admitting: Internal Medicine

## 2023-12-03 DIAGNOSIS — R0600 Dyspnea, unspecified: Secondary | ICD-10-CM | POA: Diagnosis not present

## 2023-12-03 DIAGNOSIS — R0602 Shortness of breath: Secondary | ICD-10-CM | POA: Diagnosis not present

## 2023-12-03 DIAGNOSIS — G4733 Obstructive sleep apnea (adult) (pediatric): Secondary | ICD-10-CM | POA: Diagnosis not present

## 2023-12-03 DIAGNOSIS — I10 Essential (primary) hypertension: Secondary | ICD-10-CM | POA: Diagnosis not present

## 2023-12-23 ENCOUNTER — Ambulatory Visit: Admitting: Allergy & Immunology

## 2023-12-23 ENCOUNTER — Other Ambulatory Visit: Payer: Self-pay | Admitting: Allergy & Immunology

## 2023-12-23 ENCOUNTER — Encounter: Payer: Self-pay | Admitting: Allergy & Immunology

## 2023-12-23 ENCOUNTER — Other Ambulatory Visit: Payer: Self-pay

## 2023-12-23 VITALS — BP 120/80 | HR 58 | Temp 97.3°F | Resp 18 | Ht 63.39 in | Wt 224.9 lb

## 2023-12-23 DIAGNOSIS — R053 Chronic cough: Secondary | ICD-10-CM | POA: Diagnosis not present

## 2023-12-23 DIAGNOSIS — R0982 Postnasal drip: Secondary | ICD-10-CM

## 2023-12-23 DIAGNOSIS — J302 Other seasonal allergic rhinitis: Secondary | ICD-10-CM | POA: Diagnosis not present

## 2023-12-23 DIAGNOSIS — J3089 Other allergic rhinitis: Secondary | ICD-10-CM

## 2023-12-23 MED ORDER — CARBINOXAMINE MALEATE 4 MG PO TABS: 4.0000 mg | ORAL_TABLET | Freq: Two times a day (BID) | ORAL | 3 refills | Status: DC | PRN

## 2023-12-23 MED ORDER — CARBINOXAMINE MALEATE 4 MG PO TABS: 1.0000 | ORAL_TABLET | Freq: Two times a day (BID) | ORAL | 3 refills | Status: AC | PRN

## 2023-12-23 NOTE — Patient Instructions (Addendum)
 1. Perennial and seasonal allergic rhinitis with postnasal drip - You did have some positives on blood work a few years ago. - We are going to get you scheduled for intradermal testing (needles in your arms), which is more sensitive than the blood work. - In the meantime, continue with the Singulair .  - Add on carbinoxamine 4mg  1-2 times daily to help with postnasal drip. - You will have to stop this medication for THREE DAYS before the next appointment.  2. Chronic cough - Continue with the reflux medication (pantoprazole  40mg  daily). - We may consider adding on a daily inhaler to see if this might control your cough better.   3. Return in about 2 weeks (around 01/06/2024) for SKIN TESTING (selected intradermals). You can have the follow up appointment with Dr. Iva or a Nurse Practicioner (our Nurse Practitioners are excellent and always have Physician oversight!).    Please inform us  of any Emergency Department visits, hospitalizations, or changes in symptoms. Call us  before going to the ED for breathing or allergy  symptoms since we might be able to fit you in for a sick visit. Feel free to contact us  anytime with any questions, problems, or concerns.  It was a pleasure to meet you today!  Websites that have reliable patient information: 1. American Academy of Asthma, Allergy , and Immunology: www.aaaai.org 2. Food Allergy  Research and Education (FARE): foodallergy.org 3. Mothers of Asthmatics: http://www.asthmacommunitynetwork.org 4. [employer] of Allergy , Asthma, and Immunology: www.acaai.org      "Like" us  on Facebook and Instagram for our latest updates!      A healthy democracy works best when [employer] participate! Make sure you are registered to vote! If you have moved or changed any of your contact information, you will need to get this updated before voting! Scan the QR codes below to learn more!

## 2023-12-23 NOTE — Progress Notes (Signed)
 FOLLOW UP  Date of Service/Encounter:  12/23/23   Assessment:   Postnasal drip  Chronic cough  Seasonal and perennial allergic rhinitis - planning for intradermal testing to complete her allergic rhinitis workup    Ms. Coila has a cough which is likely multifactorial in nature.  She seems to be taking her reflux medication on a routine basis, but we might consider adding on famotidine  in addition to her Protonix  if her testing is unremarkable.  Her allergic rhinitis is likely not under good control.  We are adding on carbinoxamine since this has more anticholinergic effects than the other antihistamines.  I am hoping that this will be helpful in drying up her nasal mucosa and helping with the postnasal drip.  She did call back and say that her prescription never made it to the pharmacy, so I resent it.  If I had to take a guess, I would think there is a PA pending.  Allergy  shots were discussed, but she did not seem very thrilled with that. We can certainly consider allergy  shots if she changes her mind.  Plan/Recommendations:   1. Perennial and seasonal allergic rhinitis with postnasal drip - You did have some positives on blood work a few years ago. - We are going to get you scheduled for intradermal testing (needles in your arms), which is more sensitive than the blood work. - In the meantime, continue with the Singulair .  - Add on carbinoxamine 4mg  1-2 times daily to help with postnasal drip. - You will have to stop this medication for THREE DAYS before the next appointment.  2. Chronic cough - Continue with the reflux medication (pantoprazole  40mg  daily). - We may consider adding on a daily inhaler to see if this might control your cough better.   3. Return in about 2 weeks (around 01/06/2024) for SKIN TESTING (selected intradermals). You can have the follow up appointment with Dr. Iva or a Nurse Practicioner (our Nurse Practitioners are excellent and always have  Physician oversight!).   Subjective:   MONZERRATH MCBURNEY is a 78 y.o. female presenting today for follow up of  Chief Complaint  Patient presents with   Allergic Rhinitis     Been having issues for about 30yrs and this year is the worse, sinus drainage, stuffy head and itching in back of her throat.    Orlean DELENA Ply has a history of the following: Patient Active Problem List   Diagnosis Date Noted   COVID-19 virus infection 09/09/2023   Thyroid  nodule 08/06/2023   Myalgia 06/01/2023   Allergic reaction 06/01/2023   Arthralgia 06/01/2023   Estrogen deficiency 06/01/2023   Intertrigo 01/26/2023   Cough variant asthma with UACS 06/05/2022   Abdominal aortic atherosclerosis (HCC) 09/11/2021   Hypertensive heart disease without heart failure 09/11/2021   Uncontrolled type 2 diabetes mellitus with hyperglycemia (HCC) 09/11/2021   Vitamin D  deficiency disease 09/11/2021   Class 2 severe obesity due to excess calories with serious comorbidity and body mass index (BMI) of 39.0 to 39.9 in adult (HCC) 09/11/2021   Status post total left knee replacement 05/21/2021   Degenerative arthritis of left knee 05/16/2021   Contact dermatitis of external ear 04/07/2020   ETD (Eustachian tube dysfunction), bilateral 04/03/2020   Fungal otitis externa 04/03/2020   Nasal turbinate hypertrophy 04/03/2020   S/P TKR (total knee replacement), right 04/19/2019   Osteoarthritis of right knee 04/16/2019   Obesity, diabetes, and hypertension syndrome (HCC) 06/08/2018   Bradycardia 06/08/2018  Chronic fatigue 06/08/2018   PVC (premature ventricular contraction) 08/03/2017   Costochondritis 08/02/2017   DOE (dyspnea on exertion) 08/01/2017   Chest pain 05/28/2016   Essential hypertension, benign 05/28/2016   Dyslipidemia associated with type 2 diabetes mellitus (HCC) 05/28/2016   Acute respiratory infection 03/22/2009   Seasonal allergic rhinitis 11/04/2008   Hyperlipidemia 04/11/2008   Cough  04/11/2008   OBSTRUCTIVE SLEEP APNEA 03/21/2008   CARPAL TUNNEL RELEASE, BILATERAL, HX OF 03/21/2008    History obtained from: chart review and patient.  Discussed the use of AI scribe software for clinical note transcription with the patient and/or guardian, who gave verbal consent to proceed.  Janeliz is a 78 y.o. female presenting for a follow up visit.  She was last seen by Dr. Jeneal back in September 2023.  At that time, she had environmental allergy  testing via the blood that was positive to dust mites, grasses, cockroach, molds, trees, weeds, and ragweed.  She was continued on Allegra as well as Singulair  and Atrovent .  She started her on Astelin  2 sprays per nostril up to twice daily.  She also managed her reflux by changing her from Pepcid  to a proton pump inhibitor.  She was encouraged to talk to her PCP about changing her blood pressure medicine, as she felt that the ARB might be related to her cough.  She was asked to follow-up in 3 months and presents now almost 2 years later.  In the interim, she has continued to have issues with postnasal drip and chronic cough.  Asthma/Respiratory Symptom History: She has experienced a persistent cough with production of white, thick mucus for over a year, which has significantly worsened over the past three years. The cough is unpredictable, with some days being worse than others, and is particularly troublesome at night. She finds it difficult to expel the mucus and experiences sneezing and coughing fits, such as those that occurred the day before yesterday. She frequently uses an inhaler. She has undergone a breathing test, which she passed, and has been on oxygen  previously to aid her breathing.  Is unclear whether she has been on a daily asthma inhaler to help with her symptoms.  I do not see any recent inhalers.  Allergic Rhinitis Symptom History: She underwent allergy  testing in October 2023, which revealed sensitivities to dust mites, grass,  cockroach molds, trees, ragweed, and weeds. She has been prescribed Allegra, Singulair  (montelukast ), and nasal sprays, though the specific nasal spray is unclear due to changes in prescriptions. These treatments have not been effective, leading to her referral to pulmonology.  She is not sure which nasal spray she is using now.  She reports that she has tried everything.  She is seen by Dr. Soldatova for evaluation of her chronic cough.  She has had several visits with her since her first visit in July 2024.  At the last visit, she was continued on over-the-counter antihistamine and Atrovent .  She recommended considering allergy  shots or a biologic.  GERD Symptom History: She has a history of reflux without heartburn, for which she takes medication daily, though she does not recall the name.  Review of her chart shows that she is on Protonix  40 mg daily.  Her family history includes asthma on her father's side, though she and her siblings did not have asthma during childhood. She denies any history of smoking. She experiences shortness of breath when talking and occasional itching in the center of her back when sweating.  She is retired, having  worked for ten and a half years at a company that [employer]. She has no pets inside the home, and her living environment includes hardwood floors.   Otherwise, there have been no changes to her past medical history, surgical history, family history, or social history.    Review of systems otherwise negative other than that mentioned in the HPI.    Objective:   Blood pressure 120/80, pulse (!) 58, temperature (!) 97.3 F (36.3 C), temperature source Temporal, resp. rate 18, height 5' 3.39 (1.61 m), weight 224 lb 14.4 oz (102 kg), SpO2 96%. Body mass index is 39.36 kg/m.    Physical Exam Vitals reviewed.  Constitutional:      Appearance: She is well-developed.     Comments: Smiling.  Pleasant.  HENT:     Head: Normocephalic and  atraumatic.     Right Ear: Tympanic membrane, ear canal and external ear normal. No drainage, swelling or tenderness. Tympanic membrane is not injected, scarred, erythematous, retracted or bulging.     Left Ear: Tympanic membrane, ear canal and external ear normal. No drainage, swelling or tenderness. Tympanic membrane is not injected, scarred, erythematous, retracted or bulging.     Nose: No nasal deformity, septal deviation, mucosal edema or rhinorrhea.     Right Turbinates: Enlarged, swollen and pale.     Left Turbinates: Enlarged, swollen and pale.     Right Sinus: No maxillary sinus tenderness or frontal sinus tenderness.     Left Sinus: No maxillary sinus tenderness or frontal sinus tenderness.     Comments: No polyps.    Mouth/Throat:     Mouth: Mucous membranes are not pale and not dry.     Pharynx: Uvula midline.   Eyes:     General: Lids are normal. No allergic shiner.       Right eye: No discharge.        Left eye: No discharge.     Conjunctiva/sclera: Conjunctivae normal.     Right eye: Right conjunctiva is not injected. No chemosis.    Left eye: Left conjunctiva is not injected. No chemosis.    Pupils: Pupils are equal, round, and reactive to light.    Cardiovascular:     Rate and Rhythm: Normal rate and regular rhythm.     Heart sounds: Normal heart sounds.  Pulmonary:     Effort: Pulmonary effort is normal. No tachypnea, accessory muscle usage or respiratory distress.     Breath sounds: Normal breath sounds. No wheezing, rhonchi or rales.     Comments: Moving air well in all lung fields.  No increased work of breathing. Chest:     Chest wall: No tenderness.  Abdominal:     Tenderness: There is no abdominal tenderness. There is no guarding or rebound.  Lymphadenopathy:     Head:     Right side of head: No submandibular, tonsillar or occipital adenopathy.     Left side of head: No submandibular, tonsillar or occipital adenopathy.     Cervical: No cervical  adenopathy.   Skin:    Coloration: Skin is not pale.     Findings: No abrasion, erythema, petechiae or rash. Rash is not papular, urticarial or vesicular.   Neurological:     Mental Status: She is alert.   Psychiatric:        Behavior: Behavior is cooperative.      Diagnostic studies: none      Marty Shaggy, MD  [facility] 

## 2023-12-24 ENCOUNTER — Telehealth: Payer: Self-pay | Admitting: Allergy & Immunology

## 2023-12-24 ENCOUNTER — Telehealth: Payer: Self-pay

## 2023-12-24 NOTE — Telephone Encounter (Signed)
 Message has been sent to the PA team to start on prior Auth for Carbinoxamine. Patient has been notified.

## 2023-12-24 NOTE — Telephone Encounter (Addendum)
 PA needed for Carbinoxamine Maleate 4 MG TABS patient has tried/ failed Claritin  and Allegra

## 2023-12-24 NOTE — Telephone Encounter (Signed)
 PT called to advise that Phx told her need to get PA for insurance for Carbinoxamine Maleate 4 MG TABS, asked that provider be made aware and see if correction can be made today, thanked

## 2023-12-25 NOTE — Telephone Encounter (Signed)
 Thanks for taking care of that, PA Team!   Marty Shaggy, MD [facility] 

## 2023-12-29 ENCOUNTER — Telehealth: Payer: Self-pay

## 2023-12-29 ENCOUNTER — Other Ambulatory Visit (HOSPITAL_COMMUNITY): Payer: Self-pay

## 2023-12-29 NOTE — Telephone Encounter (Signed)
*  AA  Pharmacy Patient Advocate Encounter   Received notification from Pt Calls Messages that prior authorization for {Carbinoxamine  Maleate 4MG  tablets  is required/requested.   Insurance verification completed.   The patient is insured through [facility] .   Per test claim: PA required; PA submitted to above mentioned insurance via CoverMyMeds Key/confirmation #/EOC [id number] Status is pending

## 2023-12-29 NOTE — Telephone Encounter (Signed)
 PA request has been Received. New Encounter has been or will be created for follow up. For additional info see Pharmacy Prior Auth telephone encounter from 07/07.

## 2023-12-29 NOTE — Telephone Encounter (Signed)
 Pharmacy Patient Advocate Encounter  Received notification from [facility] that Prior Authorization for Carbinoxamine  Maleate 4MG  tablets  has been APPROVED from 12/29/2023 to 06/23/2024. Ran test claim, Copay is $5.59. This test claim was processed through [facility]- copay amounts may vary at other pharmacies due to pharmacy/plan contracts, or as the patient moves through the different stages of their insurance plan.

## 2024-01-01 NOTE — Telephone Encounter (Signed)
 I called the patient and left a detailed message about medication being approved. I also left the [place] number as a call back for any questions or concerns.

## 2024-01-02 DIAGNOSIS — I10 Essential (primary) hypertension: Secondary | ICD-10-CM | POA: Diagnosis not present

## 2024-01-02 DIAGNOSIS — G4733 Obstructive sleep apnea (adult) (pediatric): Secondary | ICD-10-CM | POA: Diagnosis not present

## 2024-01-02 DIAGNOSIS — R0600 Dyspnea, unspecified: Secondary | ICD-10-CM | POA: Diagnosis not present

## 2024-01-02 DIAGNOSIS — R0602 Shortness of breath: Secondary | ICD-10-CM | POA: Diagnosis not present

## 2024-01-06 NOTE — Patient Instructions (Incomplete)
 1. Perennial and seasonal allergic rhinitis with postnasal drip - You did have some positives on blood work a few years ago. - Intradermal skin testing today is - Copy of skin test given - Start avoidance measures as below - Continue with the Singulair .  - Continue carbinoxamine  4mg  1-2 times daily to help with postnasal drip.  2. Chronic cough - Continue with the reflux medication (pantoprazole  40mg  daily). - We may consider adding on a daily inhaler to see if this might control your cough better.   3.follow up in weeks or sooner if needed

## 2024-01-07 ENCOUNTER — Ambulatory Visit: Admitting: Family

## 2024-01-07 ENCOUNTER — Encounter: Payer: Self-pay | Admitting: Family

## 2024-01-07 DIAGNOSIS — J302 Other seasonal allergic rhinitis: Secondary | ICD-10-CM | POA: Diagnosis not present

## 2024-01-07 DIAGNOSIS — J3089 Other allergic rhinitis: Secondary | ICD-10-CM | POA: Diagnosis not present

## 2024-01-07 NOTE — Progress Notes (Signed)
  Date of Service/Encounter:  01/07/24  Allergy  testing appointment   Initial visit on 12/23/23, seen for seasonal and perennial allergic rhinitis and chronic cough.  Please see that note for additional details. She was just recently able to pick up carbinoxamine  due to getting approval from her insurance.  Today reports for allergy  diagnostic testing:    DIAGNOSTICS:  Skin Testing: Select intradermal's. Adequate positive and negative controls Results discussed with patient/family.   Intradermal - 01/07/24 1400     Control Negative    [place] Omitted    [place] 3+    [place] Omitted    7 Grass 3+    Ragweed Mix Omitted    Weed Mix Omitted    Tree Mix Omitted    Mold 1 Omitted    Mold 2 Omitted    Mold 3 Negative    Mold 4 Negative    Mite Mix Omitted    Cat Negative    Dog Negative    Cockroach Omitted    Other 3+    Comments Histamine in other           Allergy  testing results were read and interpreted by myself, documented by clinical staff.  Patient provided with copy of allergy  testing along with avoidance measures when indicated.   1. Perennial and seasonal allergic rhinitis with postnasal drip - You did have some positives on blood work a few years ago. - Intradermal skin testing today is positive to [place] grass and grass mix with adequate controls.  Major mold mix 3, major mold mix 4, cat, and dog were negative - Copy of skin test given - Start avoidance measures as below - Continue with the Singulair .  - Continue carbinoxamine  4mg  1-2 times daily to help with postnasal drip.  2. Chronic cough - Continue with the reflux medication (pantoprazole  40mg  daily). - We may consider adding on a daily inhaler to see if this might control your cough better.   3.follow up in 4 weeks or sooner if needed   Reducing Pollen Exposure The American Academy of Allergy , Asthma and Immunology suggests the following steps to reduce your exposure to pollen during allergy   seasons. Do not hang sheets or clothing out to dry; pollen may collect on these items. Do not mow lawns or spend time around freshly cut grass; mowing stirs up pollen. Keep windows closed at night.  Keep car windows closed while driving. Minimize morning activities outdoors, a time when pollen counts are usually at their highest. Stay indoors as much as possible when pollen counts or humidity is high and on windy days when pollen tends to remain in the air longer. Use air conditioning when possible.  Many air conditioners have filters that trap the pollen spores. Use a HEPA room air filter to remove pollen form the indoor air you breathe.  Wanda Craze, FNP [facility]

## 2024-01-08 DIAGNOSIS — Z1231 Encounter for screening mammogram for malignant neoplasm of breast: Secondary | ICD-10-CM | POA: Diagnosis not present

## 2024-01-08 LAB — HM MAMMOGRAPHY

## 2024-01-12 ENCOUNTER — Encounter: Payer: Self-pay | Admitting: Internal Medicine

## 2024-01-15 ENCOUNTER — Encounter: Admitting: Internal Medicine

## 2024-02-02 DIAGNOSIS — G4733 Obstructive sleep apnea (adult) (pediatric): Secondary | ICD-10-CM | POA: Diagnosis not present

## 2024-02-02 DIAGNOSIS — I10 Essential (primary) hypertension: Secondary | ICD-10-CM | POA: Diagnosis not present

## 2024-02-02 DIAGNOSIS — R0600 Dyspnea, unspecified: Secondary | ICD-10-CM | POA: Diagnosis not present

## 2024-02-02 DIAGNOSIS — R0602 Shortness of breath: Secondary | ICD-10-CM | POA: Diagnosis not present

## 2024-02-05 ENCOUNTER — Encounter: Payer: Medicare HMO | Admitting: Internal Medicine

## 2024-02-05 ENCOUNTER — Other Ambulatory Visit: Payer: Self-pay

## 2024-02-05 ENCOUNTER — Ambulatory Visit: Admitting: Allergy & Immunology

## 2024-02-05 ENCOUNTER — Encounter: Payer: Self-pay | Admitting: Allergy & Immunology

## 2024-02-05 VITALS — BP 130/80 | HR 53 | Temp 97.6°F | Resp 18 | Ht 62.99 in | Wt 227.2 lb

## 2024-02-05 DIAGNOSIS — J302 Other seasonal allergic rhinitis: Secondary | ICD-10-CM | POA: Diagnosis not present

## 2024-02-05 DIAGNOSIS — R0982 Postnasal drip: Secondary | ICD-10-CM | POA: Diagnosis not present

## 2024-02-05 DIAGNOSIS — J3089 Other allergic rhinitis: Secondary | ICD-10-CM

## 2024-02-05 MED ORDER — MONTELUKAST SODIUM 10 MG PO TABS: 10.0000 mg | ORAL_TABLET | Freq: Every evening | ORAL | 1 refills | Status: AC

## 2024-02-05 MED ORDER — PANTOPRAZOLE SODIUM 40 MG PO TBEC: 40.0000 mg | DELAYED_RELEASE_TABLET | Freq: Every morning | ORAL | 1 refills | Status: AC

## 2024-02-05 MED ORDER — IPRATROPIUM BROMIDE 0.03 % NA SOLN: 2.0000 | Freq: Three times a day (TID) | NASAL | 12 refills | Status: AC

## 2024-02-05 MED ORDER — FAMOTIDINE 20 MG PO TABS: 20.0000 mg | ORAL_TABLET | Freq: Two times a day (BID) | ORAL | 1 refills | Status: DC

## 2024-02-05 NOTE — Progress Notes (Signed)
 FOLLOW UP  Date of Service/Encounter:  02/05/24   Assessment:   Postnasal drip   Chronic cough - not very consistent with any medication, so hard to tell the etiology   Seasonal and perennial allergic rhinitis (grasses, weeds, ragweed, trees, molds, dust mite, cockroach)   Plan/Recommendations:   1. Perennial and seasonal allergic rhinitis (grasses, weeds, ragweed, trees, molds, dust mite, cockroach)  - Let's add on a new nose spray to address the postnasal drip (ipratropium one spray per nostril twice daily). - Continue with Singulair  daily. - Let's try this combination to see how you do.  - We could consider allergy  shots for long-term control, so consider that as a means of long-term control.  2. Chronic cough - failed asthma medication  - Continue with the reflux medication (pantoprazole  40mg  daily and famotidine  20mg  twice daily).  3. Return in about 3 months (around 05/07/2024). You can have the follow up appointment with Dr. Iva or a Nurse Practicioner (our Nurse Practitioners are excellent and always have Physician oversight!).   Subjective:   [name] is a 78 y.o. female presenting today for follow up of  Chief Complaint  Patient presents with   Follow-up    PAHOLA DIMMITT has a history of the following: Patient Active Problem List   Diagnosis Date Noted   COVID-19 virus infection 09/09/2023   Thyroid  nodule 08/06/2023   Myalgia 06/01/2023   Allergic reaction 06/01/2023   Arthralgia 06/01/2023   Estrogen deficiency 06/01/2023   Intertrigo 01/26/2023   Cough variant asthma with UACS 06/05/2022   Abdominal aortic atherosclerosis (HCC) 09/11/2021   Hypertensive heart disease without heart failure 09/11/2021   Uncontrolled type 2 diabetes mellitus with hyperglycemia (HCC) 09/11/2021   Vitamin D  deficiency disease 09/11/2021   Class 2 severe obesity due to excess calories with serious comorbidity and body mass index (BMI) of 39.0 to 39.9 in  adult (HCC) 09/11/2021   Status post total left knee replacement 05/21/2021   Degenerative arthritis of left knee 05/16/2021   Contact dermatitis of external ear 04/07/2020   ETD (Eustachian tube dysfunction), bilateral 04/03/2020   Fungal otitis externa 04/03/2020   Nasal turbinate hypertrophy 04/03/2020   S/P TKR (total knee replacement), right 04/19/2019   Osteoarthritis of right knee 04/16/2019   Obesity, diabetes, and hypertension syndrome (HCC) 06/08/2018   Bradycardia 06/08/2018   Chronic fatigue 06/08/2018   PVC (premature ventricular contraction) 08/03/2017   Costochondritis 08/02/2017   DOE (dyspnea on exertion) 08/01/2017   Chest pain 05/28/2016   Essential hypertension, benign 05/28/2016   Dyslipidemia associated with type 2 diabetes mellitus (HCC) 05/28/2016   Acute respiratory infection 03/22/2009   Seasonal allergic rhinitis 11/04/2008   Hyperlipidemia 04/11/2008   Cough 04/11/2008   OBSTRUCTIVE SLEEP APNEA 03/21/2008   CARPAL TUNNEL RELEASE, BILATERAL, HX OF 03/21/2008    History obtained from: chart review and patient.  Discussed the use of AI scribe software for clinical note transcription with the patient and/or guardian, who gave verbal consent to proceed.  [name] is a 78 y.o. female presenting for a follow up visit. She was last seen in July 2025 by Wanda Craze.  At that time, she underwent intradermal testing.  It showed a positive reaction to [place] grass and grass mix.  She previously had testing that was positive to dust mites, grass, cockroach, molds, trees, ragweed, and weeds.  She was continued on carbinoxamine  4 mg 1-2 times daily.  She also continued with Singulair .  Since the last visit,  she has been OK.   She experiences a persistent, deep cough that sometimes results in expectoration of clear mucus. The cough worsens when she lies down, necessitating her to prop herself up in bed. Episodes of coughing are severe enough to cause back pain.  Her  asthma history includes the use of medications such as albuterol , Advair, and Flovent . She continues to use montelukast  and a nasal spray, Flonase , and has previously tried azelastine . She also takes pantoprazole  and famotidine  for reflux, which she notes does not cause a hot burning sensation.  She recalls a past incident of hemoptysis after taking carbinoxone, leading her to discontinue the medication. She has not experienced any recent sinus infections and does not identify a specific time of year when her symptoms worsen, although high humidity exacerbates her symptoms.  She has a history of scarring at both lung bases identified in a chest CT July of the previous year. She continues to follow up with pulmonology, with her next visit scheduled for the following month.  No chest pain or hot burning sensation typical of reflux. No sinus infections or specific seasonal exacerbations of her symptoms.     Otherwise, there have been no changes to her past medical history, surgical history, family history, or social history.    Review of systems otherwise negative other than that mentioned in the HPI.    Objective:   Blood pressure 130/80, pulse (!) 53, temperature 97.6 F (36.4 C), temperature source Temporal, resp. rate 18, height 5' 2.99 (1.6 m), weight 227 lb 3.2 oz (103.1 kg), SpO2 98%. Body mass index is 40.26 kg/m.    Physical Exam Vitals reviewed.  Constitutional:      Appearance: She is well-developed.     Comments: Smiling.  Pleasant. Talkative.   HENT:     Head: Normocephalic and atraumatic.     Right Ear: Tympanic membrane, ear canal and external ear normal. No drainage, swelling or tenderness. Tympanic membrane is not injected, scarred, erythematous, retracted or bulging.     Left Ear: Tympanic membrane, ear canal and external ear normal. No drainage, swelling or tenderness. Tympanic membrane is not injected, scarred, erythematous, retracted or bulging.     Nose: No nasal  deformity, septal deviation, mucosal edema or rhinorrhea.     Right Turbinates: Enlarged, swollen and pale.     Left Turbinates: Enlarged, swollen and pale.     Right Sinus: No maxillary sinus tenderness or frontal sinus tenderness.     Left Sinus: No maxillary sinus tenderness or frontal sinus tenderness.     Comments: No polyps.    Mouth/Throat:     Mouth: Mucous membranes are not pale and not dry.     Pharynx: Uvula midline.  Eyes:     General: Lids are normal. No allergic shiner.       Right eye: No discharge.        Left eye: No discharge.     Conjunctiva/sclera: Conjunctivae normal.     Right eye: Right conjunctiva is not injected. No chemosis.    Left eye: Left conjunctiva is not injected. No chemosis.    Pupils: Pupils are equal, round, and reactive to light.  Cardiovascular:     Rate and Rhythm: Normal rate and regular rhythm.     Heart sounds: Normal heart sounds.  Pulmonary:     Effort: Pulmonary effort is normal. No tachypnea, accessory muscle usage or respiratory distress.     Breath sounds: Normal breath sounds. No wheezing, rhonchi or rales.  Comments: Moving air well in all lung fields.  No increased work of breathing. Chest:     Chest wall: No tenderness.  Lymphadenopathy:     Head:     Right side of head: No submandibular, tonsillar or occipital adenopathy.     Left side of head: No submandibular, tonsillar or occipital adenopathy.     Cervical: No cervical adenopathy.  Skin:    Coloration: Skin is not pale.     Findings: No abrasion, erythema, petechiae or rash. Rash is not papular, urticarial or vesicular.  Neurological:     Mental Status: She is alert.  Psychiatric:        Behavior: Behavior is cooperative.      Diagnostic studies: none      Marty Shaggy, MD  [facility] 

## 2024-02-05 NOTE — Patient Instructions (Addendum)
 1. Perennial and seasonal allergic rhinitis (grasses, weeds, ragweed, trees, molds, dust mite, cockroach)  - Let's add on a new nose spray to address the postnasal drip (ipratropium one spray per nostril twice daily). - Continue with Singulair  daily. - Let's try this combination to see how you do.  - We could consider allergy  shots for long-term control, so consider that as a means of long-term control.  2. Chronic cough - failed asthma medication  - Continue with the reflux medication (pantoprazole  40mg  daily and famotidine  20mg  twice daily).  3. Return in about 3 months (around 05/07/2024). You can have the follow up appointment with Dr. Iva or a Nurse Practicioner (our Nurse Practitioners are excellent and always have Physician oversight!).    Please inform us  of any Emergency Department visits, hospitalizations, or changes in symptoms. Call us  before going to the ED for breathing or allergy  symptoms since we might be able to fit you in for a sick visit. Feel free to contact us  anytime with any questions, problems, or concerns.  It was a pleasure to see you again today!  Websites that have reliable patient information: 1. American Academy of Asthma, Allergy , and Immunology: www.aaaai.org 2. Food Allergy  Research and Education (FARE): foodallergy.org 3. Mothers of Asthmatics: http://www.asthmacommunitynetwork.org 4. [employer] of Allergy , Asthma, and Immunology: www.acaai.org      "Like" us  on Facebook and Instagram for our latest updates!      A healthy democracy works best when [employer] participate! Make sure you are registered to vote! If you have moved or changed any of your contact information, you will need to get this updated before voting! Scan the QR codes below to learn more!       Check out this information handout from the [employer] of Asthma, Allergy , and Immunology on allergy  shots!   https://rebrand.ly/AAC-IT-Eng     Allergy   Shots  Allergies are the result of a chain reaction that starts in the immune system. Your immune system controls how your body defends itself. For instance, if you have an allergy  to pollen, your immune system identifies pollen as an invader or allergen. Your immune system overreacts by producing antibodies called Immunoglobulin E (IgE). These antibodies travel to cells that release chemicals, causing an allergic reaction.  The concept behind allergy  immunotherapy, whether it is received in the form of shots or tablets, is that the immune system can be desensitized to specific allergens that trigger allergy  symptoms. Although it requires time and patience, the payback can be long-term relief. Allergy  injections contain a dilute solution of those substances that you are allergic to based upon your skin testing and allergy  history.   How Do Allergy  Shots Work?  Allergy  shots work much like a vaccine. Your body responds to injected amounts of a particular allergen given in increasing doses, eventually developing a resistance and tolerance to it. Allergy  shots can lead to decreased, minimal or no allergy  symptoms.  There generally are two phases: build-up and maintenance. Build-up often ranges from three to six months and involves receiving injections with increasing amounts of the allergens. The shots are typically given once or twice a week, though more rapid build-up schedules are sometimes used.  The maintenance phase begins when the most effective dose is reached. This dose is different for each person, depending on how allergic you are and your response to the build-up injections. Once the maintenance dose is reached, there are longer periods between injections, typically two to four weeks.  Occasionally doctors  give cortisone-type shots that can temporarily reduce allergy  symptoms. These types of shots are different and should not be confused with allergy  immunotherapy shots.  Who Can Be Treated  with Allergy  Shots?  Allergy  shots may be a good treatment approach for people with allergic rhinitis (hay fever), allergic asthma, conjunctivitis (eye allergy ) or stinging insect allergy .   Before deciding to begin allergy  shots, you should consider:   The length of allergy  season and the severity of your symptoms  Whether medications and/or changes to your environment can control your symptoms  Your desire to avoid long-term medication use  Time: allergy  immunotherapy requires a major time commitment  Cost: may vary depending on your insurance coverage  Allergy  shots for children age 67 and older are effective and often well tolerated. They might prevent the onset of new allergen sensitivities or the progression to asthma.  Allergy  shots are not started on patients who are pregnant but can be continued on patients who become pregnant while receiving them. In some patients with other medical conditions or who take certain common medications, allergy  shots may be of risk. It is important to mention other medications you talk to your allergist.   What are the two types of build-ups offered:   RUSH or Rapid Desensitization -- one day of injections lasting from 8:30-4:30pm, injections every 1 hour.  Approximately half of the build-up process is completed in that one day.  The following week, normal build-up is resumed, and this entails ~16 visits either weekly or twice weekly, until reaching your "maintenance dose" which is continued weekly until eventually getting spaced out to every month for a duration of 3 to 5 years. The regular build-up appointments are nurse visits where the injections are administered, followed by required monitoring for 30 minutes.    Traditional build-up -- weekly visits for 6 -12 months until reaching "maintenance dose", then continue weekly until eventually spacing out to every 4 weeks as above. At these appointments, the injections are administered, followed by  required monitoring for 30 minutes.     Either way is acceptable, and both are equally effective. With the rush protocol, the advantage is that less time is spent here for injections overall AND you would also reach maintenance dosing faster (which is when the clinical benefit starts to become more apparent). Not everyone is a candidate for rapid desensitization.   IF we proceed with the RUSH protocol, there are premedications which must be taken the day before and the day after the rush only (this includes antihistamines, steroids, and Singulair ).  After the rush day, no prednisone  or Singulair  is required, and we just recommend antihistamines taken on your injection day.  What Is An Estimate of the Costs?  If you are interested in starting allergy  injections, please check with your insurance company about your coverage for both allergy  vial sets and allergy  injections.  Please do so prior to making the appointment to start injections.  The following are CPT codes to give to your insurance company. These are the amounts we BILL to the insurance company, but the amount YOU WILL PAY and WE RECEIVE IS SUBSTANTIALLY LESS and depends on the contracts we have with different insurance companies.   Amount Billed to Insurance Two allergy  vial set  CPT 95165   $ 2400     Two injections   CPT 95117   $ 40  Regarding the allergy  injections, your co-pay may or may not apply with each injection, so please confirm this  with your insurance company. When you start allergy  injections, 1 or 2 sets of vials are made based on your allergies.  Not all patients can be on one set of vials. A set of vials lasts 6 months to a year depending on how quickly you can proceed with your build-up of your allergy  injections. Vials are personalized for each patient depending on their specific allergens.  How often are allergy  injection given during the build-up period?   Injections are given at least weekly during the build-up  period until your maintenance dose is achieved. Per the doctor's discretion, you may have the option of getting allergy  injections two times per week during the build-up period. However, there must be at least 48 hours between injections. The build-up period is usually completed within 6-12 months depending on your ability to schedule injections and for adjustments for reactions. When maintenance dose is reached, your injection schedule is gradually changed to every two weeks and later to every three weeks. Injections will then continue every 4 weeks. Usually, injections are continued for a total of 3-5 years.   When Will I Feel Better?  Some may experience decreased allergy  symptoms during the build-up phase. For others, it may take as long as 12 months on the maintenance dose. If there is no improvement after a year of maintenance, your allergist will discuss other treatment options with you.  If you aren't responding to allergy  shots, it may be because there is not enough dose of the allergen in your vaccine or there are missing allergens that were not identified during your allergy  testing. Other reasons could be that there are high levels of the allergen in your environment or major exposure to non-allergic triggers like tobacco smoke.  What Is the Length of Treatment?  Once the maintenance dose is reached, allergy  shots are generally continued for three to five years. The decision to stop should be discussed with your allergist at that time. Some people may experience a permanent reduction of allergy  symptoms. Others may relapse and a longer course of allergy  shots can be considered.  What Are the Possible Reactions?  The two types of adverse reactions that can occur with allergy  shots are local and systemic. Common local reactions include very mild redness and swelling at the injection site, which can happen immediately or several hours after. Report a delayed reaction from your last injection.  These include arm swelling or runny nose, watery eyes or cough that occurs within 12-24 hours after injection. A systemic reaction, which is less common, affects the entire body or a particular body system. They are usually mild and typically respond quickly to medications. Signs include increased allergy  symptoms such as sneezing, a stuffy nose or hives.   Rarely, a serious systemic reaction called anaphylaxis can develop. Symptoms include swelling in the throat, wheezing, a feeling of tightness in the chest, nausea or dizziness. Most serious systemic reactions develop within 30 minutes of allergy  shots. This is why it is strongly recommended you wait in your doctor's office for 30 minutes after your injections. Your allergist is trained to watch for reactions, and his or her staff is trained and equipped with the proper medications to identify and treat them.   Report to the nurse immediately if you experience any of the following symptoms: swelling, itching or redness of the skin, hives, watery eyes/nose, breathing difficulty, excessive sneezing, coughing, stomach pain, diarrhea, or light headedness. These symptoms may occur within 15-20 minutes after injection and may require  medication.   Who Should Administer Allergy  Shots?  The preferred location for receiving shots is your prescribing allergist's office. Injections can sometimes be given at another facility where the physician and staff are trained to recognize and treat reactions, and have received instructions by your prescribing allergist.  What if I am late for an injection?   Injection dose will be adjusted depending upon how many days or weeks you are late for your injection.   What if I am sick?   Please report any illness to the nurse before receiving injections. She may adjust your dose or postpone injections depending on your symptoms. If you have fever, flu, sinus infection or chest congestion it is best to postpone allergy   injections until you are better. Never get an allergy  injection if your asthma is causing you problems. If your symptoms persist, seek out medical care to get your health problem under control.  What If I am or Become Pregnant:  Women that become pregnant should schedule an appointment with [facility] before receiving any further allergy  injections.

## 2024-03-04 ENCOUNTER — Other Ambulatory Visit: Payer: Self-pay | Admitting: Internal Medicine

## 2024-03-04 DIAGNOSIS — R0602 Shortness of breath: Secondary | ICD-10-CM | POA: Diagnosis not present

## 2024-03-04 DIAGNOSIS — R0600 Dyspnea, unspecified: Secondary | ICD-10-CM | POA: Diagnosis not present

## 2024-03-04 DIAGNOSIS — G4733 Obstructive sleep apnea (adult) (pediatric): Secondary | ICD-10-CM | POA: Diagnosis not present

## 2024-03-04 DIAGNOSIS — I10 Essential (primary) hypertension: Secondary | ICD-10-CM | POA: Diagnosis not present

## 2024-03-04 NOTE — Telephone Encounter (Unsigned)
 Copied from CRM #[id number]. Topic: Clinical - Medication Refill >> Mar 04, 2024  3:21 PM Debby BROCKS wrote: Medication: estradiol  (ESTRACE ) 0.1 MG/GM vaginal cream  Has the patient contacted their pharmacy? No (Agent: If no, request that the patient contact the pharmacy for the refill. If patient does not wish to contact the pharmacy document the reason why and proceed with request.) (Agent: If yes, when and what did the pharmacy advise?) No, patient lost her previous bottle and stated she cannot get it without PCP  This is the patient's preferred pharmacy:  [facility] [address] ([address] [place] - [address] [place]) [place] [address] Phone: [phone] Fax: [phone]  Is this the correct pharmacy for this prescription? Yes If no, delete pharmacy and type the correct one.   Has the prescription been filled recently? No  Is the patient out of the medication? Yes  Has the patient been seen for an appointment in the last year OR does the patient have an upcoming appointment? Yes  Can we respond through MyChart? Yes  Agent: Please be advised that Rx refills may take up to 3 business days. We ask that you follow-up with your pharmacy.

## 2024-03-10 ENCOUNTER — Ambulatory Visit (INDEPENDENT_AMBULATORY_CARE_PROVIDER_SITE_OTHER): Payer: Self-pay | Admitting: Internal Medicine

## 2024-03-10 ENCOUNTER — Encounter: Payer: Self-pay | Admitting: Internal Medicine

## 2024-03-10 VITALS — BP 118/70 | HR 63 | Temp 98.3°F | Ht 62.0 in | Wt 224.5 lb

## 2024-03-10 DIAGNOSIS — I119 Hypertensive heart disease without heart failure: Secondary | ICD-10-CM

## 2024-03-10 DIAGNOSIS — E1169 Type 2 diabetes mellitus with other specified complication: Secondary | ICD-10-CM

## 2024-03-10 DIAGNOSIS — Z0000 Encounter for general adult medical examination without abnormal findings: Secondary | ICD-10-CM | POA: Diagnosis not present

## 2024-03-10 DIAGNOSIS — I700 Atherosclerosis of aorta: Secondary | ICD-10-CM

## 2024-03-10 DIAGNOSIS — E785 Hyperlipidemia, unspecified: Secondary | ICD-10-CM | POA: Diagnosis not present

## 2024-03-10 DIAGNOSIS — E66813 Obesity, class 3: Secondary | ICD-10-CM | POA: Diagnosis not present

## 2024-03-10 DIAGNOSIS — Z6841 Body Mass Index (BMI) 40.0 and over, adult: Secondary | ICD-10-CM

## 2024-03-10 DIAGNOSIS — R600 Localized edema: Secondary | ICD-10-CM | POA: Diagnosis not present

## 2024-03-10 DIAGNOSIS — R8290 Unspecified abnormal findings in urine: Secondary | ICD-10-CM | POA: Diagnosis not present

## 2024-03-10 DIAGNOSIS — M791 Myalgia, unspecified site: Secondary | ICD-10-CM

## 2024-03-10 MED ORDER — ESTRADIOL 0.1 MG/GM VA CREA: TOPICAL_CREAM | VAGINAL | 1 refills | Status: DC

## 2024-03-10 NOTE — Progress Notes (Signed)
 I,[name], CMA,acting as a Neurosurgeon for [name], MD.,have documented all relevant documentation on the behalf of [name], MD,as directed by  [name], MD while in the presence of [name], MD.  Subjective:    Patient ID: [name] , female    DOB: May 25, 1946 , 78 y.o.   MRN: [id number]  Chief Complaint  Patient presents with   Diabetes    Patient presents today for annual exam. She reports compliance with medications. Denies headache, chest pain & sob.     Hypertension   Annual Exam    HPI Discussed the use of AI scribe software for clinical note transcription with the patient, who gave verbal consent to proceed.  History of Present Illness [name] is a 78 year old female who presents for a physical and diabetes check.  Her blood sugar levels are stable, ranging from 109 to 114 mg/dL, managed with metformin  twice daily. She reports no new symptoms related to her heart and has not consulted other specialists recently specifically for her diabetes.  She experiences difficulty with regular exercise due to leg length discrepancy, causing imbalance and swelling. She attempts to walk once a week and uses a seated elliptical every other day. Allergy  flare-ups when outside limit her outdoor activity. She has a history of double knee replacement, with leg swelling worsening as the day progresses. She wears compression hose but notes they cause her feet to sweat and emit an odor. No swelling upon waking in the morning.  She is currently taking carvedilol  twice a day, pantoprazole  daily, rosuvastatin  20 mg daily, valsartan  80/12.5 mg, and aspirin . She also uses vaginal estrogen and tramadol , prescribed by an ENT specialist, to help with sleep due to nocturnal coughing. She experiences coughing and sinus drip when lying flat but does not feel short of breath.  She has undergone a bone density test in January, a mammogram in July, and a thyroid   ultrasound in June. She has seen a pulmonary doctor, asthma doctor, and sinus doctor this year. She does not receive the flu shot and reports regular bowel movements twice a day. She is trying to increase her water  intake, which she believes causes bloating. She has lost three pounds in the past month, attributing it to medication changes, including steroids for allergies and asthma. She has a history of vitamin D  deficiency and uses a prescription dose.   Diabetes She presents for her follow-up diabetic visit. She has type 2 diabetes mellitus. Her disease course has been stable. There are no hypoglycemic associated symptoms. Tremors: . Pertinent negatives for diabetes include no blurred vision and no chest pain. There are no hypoglycemic complications. Risk factors for coronary artery disease include dyslipidemia, diabetes mellitus, hypertension, obesity, post-menopausal and sedentary lifestyle. She is following a diabetic diet. She participates in exercise intermittently. Her breakfast blood glucose is taken between 8-9 am. Her breakfast blood glucose range is generally 110-130 mg/dl. An ACE inhibitor/angiotensin II receptor blocker is being taken. Eye exam is current.  Hypertension This is a chronic problem. The current episode started more than 1 year ago. The problem has been gradually improving since onset. The problem is controlled. Pertinent negatives include no blurred vision, chest pain, palpitations or shortness of breath. Risk factors for coronary artery disease include diabetes mellitus, dyslipidemia, obesity, post-menopausal state and sedentary lifestyle. The current treatment provides moderate improvement. Compliance problems include exercise.      Past Medical History:  Diagnosis Date  Arthritis    hands   Bladder infection    Carpal tunnel syndrome    Complication of anesthesia    Difficult to arouse   Diabetes mellitus    GERD (gastroesophageal reflux disease)    Hypertension     Sleep apnea      Family History  Problem Relation Age of Onset   Diabetes Mother    Hypertension Mother    Asthma Father    Heart attack Father 24   Stroke Father 69   Diabetes Father    Breast cancer Sister 57   Allergic rhinitis Brother    Diabetes Brother    Diabetes Brother    Atopy Paternal Grandmother      Current Outpatient Medications:    albuterol  (VENTOLIN  HFA) 108 (90 Base) MCG/ACT inhaler, Inhale 2 puffs into the lungs every 4 (four) hours as needed for wheezing or shortness of breath., Disp: 18 g, Rfl: 2   Ascorbic Acid  (VITAMIN C) 1000 MG tablet, Take 1,000 mg by mouth daily., Disp: , Rfl:    ASPIRIN  81 PO, , Disp: , Rfl:    aspirin  EC 81 MG tablet, Take 1 tablet (81 mg total) by mouth 2 (two) times daily., Disp: 60 tablet, Rfl: 0   Azelastine  HCl 0.15 % SOLN, Place 2 sprays into the nose daily., Disp: 30 mL, Rfl: 3   Bacillus Coagulans-Inulin (PROBIOTIC FORMULA PO), Take 1 capsule by mouth 2 (two) times a week., Disp: , Rfl:    Carbinoxamine  Maleate 4 MG TABS, Take 1 tablet (4 mg total) by mouth 2 (two) times daily as needed., Disp: 56 tablet, Rfl: 3   carvedilol  (COREG ) 3.125 MG tablet, Take 1 tablet (3.125 mg total) by mouth 2 (two) times daily., Disp: 180 tablet, Rfl: 1   Cholecalciferol  (VITAMIN D ) 50 MCG (2000 UT) tablet, Take 2,000 Units by mouth daily., Disp: , Rfl:    clotrimazole -betamethasone  (LOTRISONE ) cream, Apply topically 2 (two) times daily as needed., Disp: 30 g, Rfl: 1   Coenzyme Q10 (COQ10) 100 MG CAPS, Take 100 mg by mouth daily with lunch. , Disp: , Rfl:    EPINEPHrine  0.3 mg/0.3 mL IJ SOAJ injection, Inject 0.3 mg into the muscle as needed for anaphylaxis., Disp: 1 each, Rfl: 2   famotidine  (PEPCID ) 20 MG tablet, Take 1 tablet (20 mg total) by mouth 2 (two) times daily. One after supper, Disp: 180 tablet, Rfl: 1   fluticasone  (FLONASE ) 50 MCG/ACT nasal spray, Place 1 spray into both nostrils daily as needed for allergies or rhinitis., Disp: 9.9  mL, Rfl: 3   furosemide  (LASIX ) 20 MG tablet, Take 1 tablet (20 mg total) by mouth daily as needed (fluid)., Disp: 90 tablet, Rfl: 1   ipratropium (ATROVENT ) 0.03 % nasal spray, Place 2 sprays into both nostrils 3 (three) times daily., Disp: 30 mL, Rfl: 12   Lancets (ONETOUCH DELICA PLUS LANCET33G) MISC, USE 1  TO CHECK GLUCOSE TWICE DAILY, BEFORE BREAKFAST AND DINNER, Disp: 100 each, Rfl: 0   metFORMIN  (GLUCOPHAGE ) 500 MG tablet, TAKE 1 TABLET BY MOUTH TWICE DAILY WITH A MEAL, Disp: 180 tablet, Rfl: 0   montelukast  (SINGULAIR ) 10 MG tablet, Take 1 tablet (10 mg total) by mouth at bedtime., Disp: 90 tablet, Rfl: 1   nystatin  (MYCOSTATIN /NYSTOP ) powder, Apply 1 Application topically 2 (two) times daily. To affected area(s), Disp: 60 g, Rfl: 1   nystatin  cream (MYCOSTATIN ), APPLY  CREAM TOPICALLY TWICE DAILY AS NEEDED, Disp: 30 g, Rfl: 0   ONETOUCH VERIO  test strip, USE 1 STRIP TO CHECK GLUCOSE TWICE DAILY, Disp: 100 each, Rfl: 0   pantoprazole  (PROTONIX ) 40 MG tablet, Take 1 tablet (40 mg total) by mouth in the morning., Disp: 90 tablet, Rfl: 1   Potassium Chloride  (KLOR-CON  PO), , Disp: , Rfl:    potassium chloride  SA (KLOR-CON  M) 20 MEQ tablet, One tab po every day prn, with each dose of furosemide ., Disp: 90 tablet, Rfl: 1   rosuvastatin  (CRESTOR ) 20 MG tablet, Take 20 mg by mouth daily., Disp: , Rfl:    traMADol  (ULTRAM ) 50 MG tablet, Take 1 tablet (50 mg total) by mouth at bedtime., Disp: 30 tablet, Rfl: 1   valsartan -hydrochlorothiazide  (DIOVAN -HCT) 80-12.5 MG tablet, Take 1 tablet by mouth daily., Disp: , Rfl:    estradiol  (ESTRACE ) 0.1 MG/GM vaginal cream, Apply vaginally no more than twice weekly, Disp: 42.5 g, Rfl: 1   Allergies  Allergen Reactions   Pravastatin  Hives   Haemophilus Influenzae Vaccines     Nausea, pain, body weakness   Sulfa Antibiotics Itching   Sulfonamide Derivatives Itching   Tetanus Toxoid, Adsorbed Other (See Comments)   Tetanus Toxoid-Containing Vaccines  Swelling    Arm swelled at injection site      The patient states she uses post menopausal status for birth control. No LMP recorded. Patient has had a hysterectomy.. Negative for Dysmenorrhea. Negative for: breast discharge, breast lump(s), breast pain and breast self exam. Associated symptoms include abnormal vaginal bleeding. Pertinent negatives include abnormal bleeding (hematology), anxiety, decreased libido, depression, difficulty falling sleep, dyspareunia, history of infertility, nocturia, sexual dysfunction, sleep disturbances, urinary incontinence, urinary urgency, vaginal discharge and vaginal itching. Diet regular.The patient states her exercise level is  intermittent.  . The patient's tobacco use is:  Social History   Tobacco Use  Smoking Status Never   Passive exposure: Never  Smokeless Tobacco Never  . She has been exposed to passive smoke. The patient's alcohol use is:  Social History   Substance and Sexual Activity  Alcohol Use No    Review of Systems  Constitutional: Negative.   HENT: Negative.    Eyes: Negative.  Negative for blurred vision.  Respiratory: Negative.  Negative for shortness of breath.   Cardiovascular: Negative.  Negative for chest pain and palpitations.  Gastrointestinal: Negative.   Endocrine: Negative.   Genitourinary: Negative.   Musculoskeletal: Negative.   Skin: Negative.   Allergic/Immunologic: Negative.   Neurological: Negative.  Tremors: .  Hematological: Negative.   Psychiatric/Behavioral: Negative.       Today's Vitals   03/10/24 1554  BP: 118/70  Pulse: 63  Temp: 98.3 F (36.8 C)  SpO2: 98%  Weight: 224 lb 8 oz (101.8 kg)  Height: 5' 2 (1.575 m)   Body mass index is 41.06 kg/m.  Wt Readings from Last 3 Encounters:  03/10/24 224 lb 8 oz (101.8 kg)  02/05/24 227 lb 3.2 oz (103.1 kg)  12/23/23 224 lb 14.4 oz (102 kg)     Objective:  Physical Exam Vitals and nursing note reviewed.  Constitutional:      Appearance:  Normal appearance. She is obese.  HENT:     Head: Normocephalic and atraumatic.     Right Ear: Tympanic membrane, ear canal and external ear normal.     Left Ear: Tympanic membrane, ear canal and external ear normal.     Mouth/Throat:     Pharynx: No oropharyngeal exudate or posterior oropharyngeal erythema.  Eyes:     Extraocular Movements: Extraocular movements intact.  Conjunctiva/sclera: Conjunctivae normal.     Pupils: Pupils are equal, round, and reactive to light.  Cardiovascular:     Rate and Rhythm: Normal rate and regular rhythm.     Pulses: Normal pulses.          Dorsalis pedis pulses are 2+ on the right side and 2+ on the left side.     Heart sounds: Normal heart sounds.  Pulmonary:     Effort: Pulmonary effort is normal.     Breath sounds: Normal breath sounds.  Chest:  Breasts:    Tanner Score is 5.     Right: Normal.     Left: Normal.  Abdominal:     General: Bowel sounds are normal.     Palpations: Abdomen is soft.     Comments: Obese, soft  Genitourinary:    Comments: deferred Musculoskeletal:        General: Normal range of motion.     Cervical back: Normal range of motion and neck supple.     Right lower leg: Edema present.  Feet:     Right foot:     Protective Sensation: 5 sites tested.  5 sites sensed.     Skin integrity: Dry skin present.     Toenail Condition: Right toenails are normal.     Left foot:     Protective Sensation: 5 sites tested.  5 sites sensed.     Skin integrity: Dry skin present.     Toenail Condition: Left toenails are normal.  Skin:    General: Skin is warm and dry.  Neurological:     General: No focal deficit present.     Mental Status: She is alert and oriented to person, place, and time.  Psychiatric:        Mood and Affect: Mood normal.        Behavior: Behavior normal.         Assessment And Plan:     Encounter for annual health examination Assessment & Plan: A full exam was performed.  Importance of monthly  self breast exams was discussed with the patient.  She is advised to get 30-45 minutes of regular exercise, no less than four to five days per week. Both weight-bearing and aerobic exercises are recommended.  She is advised to follow a healthy diet with at least six fruits/veggies per day, decrease intake of red meat and other saturated fats and to increase fish intake to twice weekly.  Meats/fish should not be fried -- baked, boiled or broiled is preferable. It is also important to cut back on your sugar intake.  Be sure to read labels - try to avoid anything with added sugar, high fructose corn syrup or other sweeteners.  If you must use a sweetener, you can try stevia or monkfruit.  It is also important to avoid artificially sweetened foods/beverages and diet drinks. Lastly, wear SPF 50 sunscreen on exposed skin and when in direct sunlight for an extended period of time.  Be sure to avoid fast food restaurants and aim for at least 60 ounces of water  daily.       Dyslipidemia associated with type 2 diabetes mellitus (HCC) Assessment & Plan: Chronic, diabetic foot exam was performed.  Well-controlled with blood glucose levels 109-114 mg/dL.  She is unable to start SGLT2i due to h/o frequent UTIs. Would benefit from Glp-1 therapy. Will consider this in the future (oral). - Continue metformin  twice daily. - Order A1c test. - Schedule diabetes check appointment. -  I DISCUSSED WITH THE PATIENT AT LENGTH REGARDING THE GOALS OF GLYCEMIC CONTROL AND POSSIBLE LONG-TERM COMPLICATIONS.  I  ALSO STRESSED THE IMPORTANCE OF COMPLIANCE WITH HOME GLUCOSE MONITORING, DIETARY RESTRICTIONS INCLUDING AVOIDANCE OF SUGARY DRINKS/PROCESSED FOODS,  ALONG WITH REGULAR EXERCISE.  I  ALSO STRESSED THE IMPORTANCE OF ANNUAL EYE EXAMS, SELF FOOT CARE AND COMPLIANCE WITH OFFICE VISITS.   Orders: -     CBC -     CMP14+EGFR -     Lipid panel -     Hemoglobin A1c  Hypertensive heart disease without heart failure Assessment &  Plan: Chronic, controlled.  EKG performed, NSR w/ LVH and nonspecific T abnormality.  She will continue with carvedilol  3.125mg  twice daily and valsartan /hct 80/12.5mg  daily. She is encouraged to follow low sodium diet.  - She will f/u in four months.   Orders: -     CMP14+EGFR -     Lipid panel -     POCT URINALYSIS DIP (CLINITEK) -     Microalbumin / creatinine urine ratio -     EKG 12-Lead  Abdominal aortic atherosclerosis Assessment & Plan: Chronic, LDL goal < 70.  She will c/w rosuvastatin  20mg  daily. She is encouraged to follow a heart healthy lifestyle.   Orders: -     Lipid panel  Edema of right lower extremity Assessment & Plan: Possibly related to previous bilateral knee replacement and reduced mobility. Swelling increases throughout the day. - Encourage calf raises to improve circulation. - Continue wearing compression hose as needed. - Recommend exercises to improve circulation.   Abnormal urine -     Urine Culture  Class 3 severe obesity due to excess calories with serious comorbidity and body mass index (BMI) of 40.0 to 44.9 in adult Assessment & Plan: She is encouraged to strive for BMI<30 to decrease cardiac risk. Advised to aim for at least 150 minutes of exercise per week.      Other orders -     Estradiol ; Apply vaginally no more than twice weekly  Dispense: 42.5 g; Refill: 1    Return for 1 year HM, 4 MONTH DM F/U.SABRA Patient was given opportunity to ask questions. Patient verbalized understanding of the plan and was able to repeat key elements of the plan. All questions were answered to their satisfaction.   I, [name], MD, have reviewed all documentation for this visit. The documentation on 03/10/24 for the exam, diagnosis, procedures, and orders are all accurate and complete.

## 2024-03-10 NOTE — Patient Instructions (Addendum)

## 2024-03-11 LAB — CMP14+EGFR
ALT: 9 IU/L (ref 0–32)
AST: 14 IU/L (ref 0–40)
Albumin: 4.2 g/dL (ref 3.8–4.8)
Alkaline Phosphatase: 112 IU/L (ref 49–135)
BUN/Creatinine Ratio: 17 (ref 12–28)
BUN: 17 mg/dL (ref 8–27)
Bilirubin Total: 0.4 mg/dL (ref 0.0–1.2)
CO2: 25 mmol/L (ref 20–29)
Calcium: 9.5 mg/dL (ref 8.7–10.3)
Chloride: 103 mmol/L (ref 96–106)
Creatinine, Ser: 1.02 mg/dL — ABNORMAL HIGH (ref 0.57–1.00)
Globulin, Total: 2.7 g/dL (ref 1.5–4.5)
Glucose: 92 mg/dL (ref 70–99)
Potassium: 4 mmol/L (ref 3.5–5.2)
Sodium: 145 mmol/L — ABNORMAL HIGH (ref 134–144)
Total Protein: 6.9 g/dL (ref 6.0–8.5)
eGFR: 56 mL/min/1.73 — ABNORMAL LOW (ref >59)

## 2024-03-11 LAB — HEMOGLOBIN A1C
Est. average glucose Bld gHb Est-mCnc: 131 mg/dL
Hgb A1c MFr Bld: 6.2 % — ABNORMAL HIGH (ref 4.8–5.6)

## 2024-03-11 LAB — LIPID PANEL
Chol/HDL Ratio: 2.9 ratio (ref 0.0–4.4)
Cholesterol, Total: 235 mg/dL — ABNORMAL HIGH (ref 100–199)
HDL: 81 mg/dL (ref >39)
LDL Chol Calc (NIH): 138 mg/dL — ABNORMAL HIGH (ref 0–99)
Triglycerides: 91 mg/dL (ref 0–149)
VLDL Cholesterol Cal: 16 mg/dL (ref 5–40)

## 2024-03-11 LAB — MICROALBUMIN / CREATININE URINE RATIO
Creatinine, Urine: 208.5 mg/dL
Microalb/Creat Ratio: 5 mg/g{creat} (ref 0–29)
Microalbumin, Urine: 9.8 ug/mL

## 2024-03-11 LAB — CBC
Hematocrit: 40.3 % (ref 34.0–46.6)
Hemoglobin: 12.5 g/dL (ref 11.1–15.9)
MCH: 28.5 pg (ref 26.6–33.0)
MCHC: 31 g/dL — ABNORMAL LOW (ref 31.5–35.7)
MCV: 92 fL (ref 79–97)
Platelets: 205 x10E3/uL (ref 150–450)
RBC: 4.39 x10E6/uL (ref 3.77–5.28)
RDW: 13 % (ref 11.7–15.4)
WBC: 6 x10E3/uL (ref 3.4–10.8)

## 2024-03-12 LAB — URINE CULTURE

## 2024-03-13 DIAGNOSIS — Z0000 Encounter for general adult medical examination without abnormal findings: Secondary | ICD-10-CM | POA: Insufficient documentation

## 2024-03-13 NOTE — Assessment & Plan Note (Addendum)
 Chronic, diabetic foot exam was performed.  Well-controlled with blood glucose levels 109-114 mg/dL.  She is unable to start SGLT2i due to h/o frequent UTIs. Would benefit from Glp-1 therapy. Will consider this in the future (oral). - Continue metformin  twice daily. - Order A1c test. - Schedule diabetes check appointment. - I DISCUSSED WITH THE PATIENT AT LENGTH REGARDING THE GOALS OF GLYCEMIC CONTROL AND POSSIBLE LONG-TERM COMPLICATIONS.  I  ALSO STRESSED THE IMPORTANCE OF COMPLIANCE WITH HOME GLUCOSE MONITORING, DIETARY RESTRICTIONS INCLUDING AVOIDANCE OF SUGARY DRINKS/PROCESSED FOODS,  ALONG WITH REGULAR EXERCISE.  I  ALSO STRESSED THE IMPORTANCE OF ANNUAL EYE EXAMS, SELF FOOT CARE AND COMPLIANCE WITH OFFICE VISITS.

## 2024-03-13 NOTE — Assessment & Plan Note (Signed)
 She is encouraged to strive for BMI<30 to decrease cardiac risk. Advised to aim for at least 150 minutes of exercise per week.

## 2024-03-20 ENCOUNTER — Ambulatory Visit: Payer: Self-pay | Admitting: Internal Medicine

## 2024-03-21 DIAGNOSIS — R600 Localized edema: Secondary | ICD-10-CM | POA: Insufficient documentation

## 2024-03-21 NOTE — Assessment & Plan Note (Signed)
 Chronic, controlled.  EKG performed, NSR w/ LVH and nonspecific T abnormality.  She will continue with carvedilol  3.125mg  twice daily and valsartan /hct 80/12.5mg  daily. She is encouraged to follow low sodium diet.  - She will f/u in four months.

## 2024-03-21 NOTE — Assessment & Plan Note (Signed)
 Possibly related to previous bilateral knee replacement and reduced mobility. Swelling increases throughout the day. - Encourage calf raises to improve circulation. - Continue wearing compression hose as needed. - Recommend exercises to improve circulation.

## 2024-03-21 NOTE — Assessment & Plan Note (Signed)
 Chronic, LDL goal < 70.  She will c/w rosuvastatin 20mg  daily. She is encouraged to follow a heart healthy lifestyle.

## 2024-03-21 NOTE — Assessment & Plan Note (Signed)

## 2024-03-22 LAB — POCT URINALYSIS DIP (CLINITEK)
Bilirubin, UA: NEGATIVE
Glucose, UA: NEGATIVE mg/dL
Ketones, POC UA: NEGATIVE mg/dL
Nitrite, UA: NEGATIVE
POC PROTEIN,UA: NEGATIVE
Spec Grav, UA: 1.02 (ref 1.010–1.025)
Urobilinogen, UA: 0.2 U/dL
pH, UA: 6 (ref 5.0–8.0)

## 2024-03-22 NOTE — Addendum Note (Signed)
 Addended by: EMMITT FINE T on: 03/22/2024 04:09 PM   Modules accepted: Orders

## 2024-03-31 DIAGNOSIS — H524 Presbyopia: Secondary | ICD-10-CM | POA: Diagnosis not present

## 2024-03-31 DIAGNOSIS — H52222 Regular astigmatism, left eye: Secondary | ICD-10-CM | POA: Diagnosis not present

## 2024-04-02 ENCOUNTER — Other Ambulatory Visit: Payer: Self-pay | Admitting: Internal Medicine

## 2024-04-03 DIAGNOSIS — G4733 Obstructive sleep apnea (adult) (pediatric): Secondary | ICD-10-CM | POA: Diagnosis not present

## 2024-04-03 DIAGNOSIS — R0600 Dyspnea, unspecified: Secondary | ICD-10-CM | POA: Diagnosis not present

## 2024-04-03 DIAGNOSIS — I10 Essential (primary) hypertension: Secondary | ICD-10-CM | POA: Diagnosis not present

## 2024-04-05 ENCOUNTER — Other Ambulatory Visit: Payer: Self-pay | Admitting: Internal Medicine

## 2024-04-22 ENCOUNTER — Ambulatory Visit: Admitting: Podiatry

## 2024-04-22 ENCOUNTER — Encounter: Payer: Self-pay | Admitting: Podiatry

## 2024-04-22 DIAGNOSIS — B351 Tinea unguium: Secondary | ICD-10-CM | POA: Diagnosis not present

## 2024-04-22 DIAGNOSIS — M79674 Pain in right toe(s): Secondary | ICD-10-CM

## 2024-04-22 DIAGNOSIS — M79675 Pain in left toe(s): Secondary | ICD-10-CM | POA: Diagnosis not present

## 2024-04-22 DIAGNOSIS — E1165 Type 2 diabetes mellitus with hyperglycemia: Secondary | ICD-10-CM

## 2024-04-22 NOTE — Addendum Note (Signed)
 Addended by: LOREDA HACKER on: 04/22/2024 03:36 PM   Modules accepted: Level of Service

## 2024-04-22 NOTE — Progress Notes (Addendum)
 This patient presents  to my office for at risk foot care.  This patient requires this care by a professional since this patient will be at risk due to having diabetic. This patient is unable to cut nails herself since the patient cannot reach her nails.These nails are painful walking and wearing shoes.  This patient presents for at risk foot care today.  General Appearance  Alert, conversant and in no acute stress.  Vascular  Dorsalis pedis and posterior tibial  pulses are palpable  bilaterally.  Capillary return is within normal limits  bilaterally. Temperature is within normal limits  bilaterally.  Neurologic  Senn-Weinstein monofilament wire test within normal limits  bilaterally. Muscle power within normal limits bilaterally.  Nails Thick disfigured discolored nails with subungual debris  from hallux to fifth toes bilaterally. No evidence of bacterial infection or drainage bilaterally.  Orthopedic  No limitations of motion  feet .  No crepitus or effusions noted.  No bony pathology or digital deformities noted.  HAV  B/L.  Skin  normotropic skin with no porokeratosis noted bilaterally.  No signs of infections or ulcers noted.  Pinch callus    Onychomycosis  Pain in right toes  Pain in left toes  Consent was obtained for treatment procedures.   Mechanical debridement of nails 1-5  bilaterally performed with a nail nipper.  Filed with dremel without incident.    Return office visit    3 months                  Told patient to return for periodic foot care and evaluation due to potential at risk complications.   Cordella Bold DPM  tomma

## 2024-04-27 ENCOUNTER — Ambulatory Visit
Admission: EM | Admit: 2024-04-27 | Discharge: 2024-04-27 | Disposition: A | Discharge status: Home / Self Care | Attending: Family Medicine | Admitting: Family Medicine

## 2024-04-27 ENCOUNTER — Ambulatory Visit (INDEPENDENT_AMBULATORY_CARE_PROVIDER_SITE_OTHER)

## 2024-04-27 DIAGNOSIS — J069 Acute upper respiratory infection, unspecified: Secondary | ICD-10-CM

## 2024-04-27 DIAGNOSIS — R051 Acute cough: Secondary | ICD-10-CM | POA: Diagnosis not present

## 2024-04-27 DIAGNOSIS — I771 Stricture of artery: Secondary | ICD-10-CM | POA: Diagnosis not present

## 2024-04-27 DIAGNOSIS — R918 Other nonspecific abnormal finding of lung field: Secondary | ICD-10-CM | POA: Diagnosis not present

## 2024-04-27 DIAGNOSIS — R059 Cough, unspecified: Secondary | ICD-10-CM | POA: Diagnosis not present

## 2024-04-27 LAB — POCT RESPIRATORY SYNCYTIAL VIRUS: RSV Rapid Ag: NEGATIVE

## 2024-04-27 LAB — POC COVID19/FLU A&B COMBO
Covid Antigen, POC: NEGATIVE
Influenza A Antigen, POC: NEGATIVE
Influenza B Antigen, POC: NEGATIVE

## 2024-04-27 MED ORDER — PREDNISONE 20 MG PO TABS: 40.0000 mg | ORAL_TABLET | Freq: Every day | ORAL | 0 refills | Status: AC

## 2024-04-27 MED ORDER — BENZONATATE 100 MG PO CAPS: 100.0000 mg | ORAL_CAPSULE | Freq: Three times a day (TID) | ORAL | 0 refills | Status: DC

## 2024-04-27 NOTE — ED Provider Notes (Signed)
 [facility]    CSN: [id number] Arrival date & time: 04/27/24  1254      History   Chief Complaint Chief Complaint  Patient presents with   Cough    HPI [name] is a 78 y.o. female.    Cough Associated symptoms: fever and rhinorrhea   Associated symptoms: no wheezing    Patient is here for URI symptoms for 3 days.  Having a cough, thick, white mucous.  She had had off/on fever/chills.  Highest temp of 100.  Her family members have been dx with bronchitis, and she thinks she has it as well.  She is taking ASA only.  Slight sob with coughing deep/hard.        Past Medical History:  Diagnosis Date   Arthritis    hands   Bladder infection    Carpal tunnel syndrome    Complication of anesthesia    Difficult to arouse   Diabetes mellitus    GERD (gastroesophageal reflux disease)    Hypertension    Sleep apnea     Patient Active Problem List   Diagnosis Date Noted   Edema of right lower extremity 03/21/2024   Encounter for annual health examination 03/13/2024   COVID-19 virus infection 09/09/2023   Thyroid  nodule 08/06/2023   Myalgia 06/01/2023   Allergic reaction 06/01/2023   Arthralgia 06/01/2023   Estrogen deficiency 06/01/2023   Intertrigo 01/26/2023   Cough variant asthma with UACS 06/05/2022   Abdominal aortic atherosclerosis 09/11/2021   Hypertensive heart disease without heart failure 09/11/2021   Uncontrolled type 2 diabetes mellitus with hyperglycemia (HCC) 09/11/2021   Vitamin D  deficiency disease 09/11/2021   Class 3 severe obesity due to excess calories with serious comorbidity and body mass index (BMI) of 40.0 to 44.9 in adult (HCC) 09/11/2021   Status post total left knee replacement 05/21/2021   Degenerative arthritis of left knee 05/16/2021   Contact dermatitis of external ear 04/07/2020   ETD (Eustachian tube dysfunction), bilateral 04/03/2020   Fungal otitis externa 04/03/2020   Nasal turbinate hypertrophy  04/03/2020   S/P TKR (total knee replacement), right 04/19/2019   Osteoarthritis of right knee 04/16/2019   Obesity, diabetes, and hypertension syndrome (HCC) 06/08/2018   Bradycardia 06/08/2018   Chronic fatigue 06/08/2018   PVC (premature ventricular contraction) 08/03/2017   Costochondritis 08/02/2017   DOE (dyspnea on exertion) 08/01/2017   Chest pain 05/28/2016   Essential hypertension, benign 05/28/2016   Dyslipidemia associated with type 2 diabetes mellitus (HCC) 05/28/2016   Acute respiratory infection 03/22/2009   Seasonal allergic rhinitis 11/04/2008   Hyperlipidemia 04/11/2008   Cough 04/11/2008   OBSTRUCTIVE SLEEP APNEA 03/21/2008   CARPAL TUNNEL RELEASE, BILATERAL, HX OF 03/21/2008    Past Surgical History:  Procedure Laterality Date   ABDOMINAL HYSTERECTOMY  1985   partial   ADENOIDECTOMY     APPENDECTOMY     age 35   CARPAL TUNNEL RELEASE Bilateral 1980   cataract surgery Right 07/07/2020   Dr. Octavia   cataract surgery Left 12/2020   HERNIA REPAIR  1990   TONSILLECTOMY     as a child   TOTAL KNEE ARTHROPLASTY Right 04/19/2019   Procedure: RIGHT TOTAL KNEE ARTHROPLASTY;  Surgeon: Liam Lerner, MD;  Location: [facility];  Service: Orthopedics;  Laterality: Right;   TOTAL KNEE ARTHROPLASTY Left 05/21/2021   Procedure: LEFT TOTAL KNEE ARTHROPLASTY;  Surgeon: Liam Lerner, MD;  Location: [facility];  Service: Orthopedics;  Laterality: Left;    OB  History   No obstetric history on file.      Home Medications    Prior to Admission medications   Medication Sig Start Date End Date Taking? Authorizing Provider  albuterol  (VENTOLIN  HFA) 108 (90 Base) MCG/ACT inhaler Inhale 2 puffs into the lungs every 4 (four) hours as needed for wheezing or shortness of breath. 09/09/23   Darlean Ozell NOVAK, MD  Ascorbic Acid  (VITAMIN C) 1000 MG tablet Take 1,000 mg by mouth daily.    [provider]  ASPIRIN  81 PO     [provider]  aspirin  EC 81 MG tablet Take 1  tablet (81 mg total) by mouth 2 (two) times daily. 05/21/21   Orlando Camellia POUR, PA-C  Azelastine  HCl 0.15 % SOLN Place 2 sprays into the nose daily. 12/25/22   Soldatova, Liuba, MD  Bacillus Coagulans-Inulin (PROBIOTIC FORMULA PO) Take 1 capsule by mouth 2 (two) times a week.    [provider]  Carbinoxamine  Maleate 4 MG TABS Take 1 tablet (4 mg total) by mouth 2 (two) times daily as needed. 12/23/23   Iva Marty Saltness, MD  carvedilol  (COREG ) 3.125 MG tablet Take 1 tablet (3.125 mg total) by mouth 2 (two) times daily. 08/06/23   Jarold Medici, MD  Cholecalciferol  (VITAMIN D ) 50 MCG (2000 UT) tablet Take 2,000 Units by mouth daily.    [provider]  clotrimazole -betamethasone  (LOTRISONE ) cream Apply topically 2 (two) times daily as needed. 03/06/21   Jarold Medici, MD  Coenzyme Q10 (COQ10) 100 MG CAPS Take 100 mg by mouth daily with lunch.     [provider]  EPINEPHrine  0.3 mg/0.3 mL IJ SOAJ injection Inject 0.3 mg into the muscle as needed for anaphylaxis. 11/13/22   Georgina Speaks, FNP  famotidine  (PEPCID ) 20 MG tablet TAKE 1 TABLET BY MOUTH AFTER SUPPER 04/05/24   Wert, Michael B, MD  fluticasone  (FLONASE ) 50 MCG/ACT nasal spray Place 1 spray into both nostrils daily as needed for allergies or rhinitis. 11/06/23   Soldatova, Liuba, MD  furosemide  (LASIX ) 20 MG tablet Take 1 tablet (20 mg total) by mouth daily as needed (fluid). 08/06/23   Jarold Medici, MD  ipratropium (ATROVENT ) 0.03 % nasal spray Place 2 sprays into both nostrils 3 (three) times daily. 02/05/24   Iva Marty Saltness, MD  Lancets [facility] DELICA PLUS [place]) MISC USE 1  TO CHECK GLUCOSE TWICE DAILY, BEFORE BREAKFAST AND DINNER 09/01/23   Jarold Medici, MD  metFORMIN  (GLUCOPHAGE ) 500 MG tablet TAKE 1 TABLET BY MOUTH TWICE DAILY WITH A MEAL 04/05/24   Jarold Medici, MD  montelukast  (SINGULAIR ) 10 MG tablet Take 1 tablet (10 mg total) by mouth at bedtime. 02/05/24   Iva Marty Saltness, MD   nystatin  (MYCOSTATIN /NYSTOP ) powder Apply 1 Application topically 2 (two) times daily. To affected area(s) 01/22/23   Jarold Medici, MD  nystatin  cream (MYCOSTATIN ) APPLY  CREAM TOPICALLY TWICE DAILY AS NEEDED 07/21/23   Jarold Medici, MD  [facility] VERIO test strip USE 1 STRIP TO CHECK GLUCOSE TWICE DAILY 09/01/23   Jarold Medici, MD  pantoprazole  (PROTONIX ) 40 MG tablet Take 1 tablet (40 mg total) by mouth in the morning. 02/05/24   Iva Marty Saltness, MD  Potassium Chloride  (KLOR-CON  PO)     [provider]  potassium chloride  SA (KLOR-CON  M) 20 MEQ tablet One tab po every day prn, with each dose of furosemide . 01/22/23   Jarold Medici, MD  rosuvastatin  (CRESTOR ) 20 MG tablet Take 20 mg by mouth daily.  [provider]  traMADol  (ULTRAM ) 50 MG tablet Take 1 tablet (50 mg total) by mouth at bedtime. 11/06/23   Soldatova, Liuba, MD  valsartan -hydrochlorothiazide  (DIOVAN -HCT) 80-12.5 MG tablet Take 1 tablet by mouth daily. 08/30/22   [provider]    Family History Family History  Problem Relation Age of Onset   Diabetes Mother    Hypertension Mother    Asthma Father    Heart attack Father 33   Stroke Father 46   Diabetes Father    Breast cancer Sister 55   Allergic rhinitis Brother    Diabetes Brother    Diabetes Brother    Atopy Paternal Grandmother     Social History Social History   Tobacco Use   Smoking status: Never    Passive exposure: Never   Smokeless tobacco: Never  Vaping Use   Vaping status: Never Used  Substance Use Topics   Alcohol use: No   Drug use: No     Allergies   Pravastatin ; Haemophilus influenzae vaccines; Sulfa antibiotics; Sulfonamide derivatives; Tetanus toxoid, adsorbed; and Tetanus toxoid-containing vaccines   Review of Systems Review of Systems  Constitutional:  Positive for fever.  HENT:  Positive for congestion and rhinorrhea.   Respiratory:  Positive for cough. Negative for wheezing.   Gastrointestinal:  Negative.   Genitourinary: Negative.   Musculoskeletal: Negative.   Psychiatric/Behavioral: Negative.       Physical Exam Triage Vital Signs ED Triage Vitals [04/27/24 1314]  Encounter Vitals Group     BP 130/80     Girls Systolic BP Percentile      Girls Diastolic BP Percentile      Boys Systolic BP Percentile      Boys Diastolic BP Percentile      Pulse Rate 61     Resp 18     Temp 97.9 F (36.6 C)     Temp Source Oral     SpO2 95 %     Weight      Height      Head Circumference      Peak Flow      Pain Score 0     Pain Loc      Pain Education      Exclude from Growth Chart    No data found.  Updated Vital Signs BP 130/80 (BP Location: Right Arm)   Pulse 61   Temp 97.9 F (36.6 C) (Oral)   Resp 18   SpO2 95%   Visual Acuity Right Eye Distance:   Left Eye Distance:   Bilateral Distance:    Right Eye Near:   Left Eye Near:    Bilateral Near:     Physical Exam Constitutional:      General: She is not in acute distress.    Appearance: Normal appearance. She is normal weight. She is not ill-appearing or toxic-appearing.  HENT:     Nose: Congestion present.     Mouth/Throat:     Mouth: Mucous membranes are moist.  Cardiovascular:     Rate and Rhythm: Normal rate and regular rhythm.  Pulmonary:     Effort: Pulmonary effort is normal.     Breath sounds: Normal breath sounds. No wheezing or rhonchi.  Musculoskeletal:     Cervical back: Normal range of motion and neck supple. No tenderness.  Lymphadenopathy:     Cervical: No cervical adenopathy.  Skin:    General: Skin is warm.  Neurological:     General: No focal deficit present.  Mental Status: She is alert.  Psychiatric:        Mood and Affect: Mood normal.      UC Treatments / Results  Labs (all labs ordered are listed, but only abnormal results are displayed) Labs Reviewed  POC COVID19/FLU A&B COMBO - Normal  POCT RESPIRATORY SYNCYTIAL VIRUS - Normal    EKG   Radiology DG Chest  2 View Result Date: 04/27/2024 EXAM: 2 VIEW(S) XRAY OF THE CHEST 04/27/2024 01:42:56 PM COMPARISON: 06/05/2022 CLINICAL HISTORY: cough FINDINGS: LUNGS AND PLEURA: Emphysematous changes in the lungs. No focal pulmonary opacity. No pulmonary edema. No pleural effusion. No pneumothorax. HEART AND MEDIASTINUM: Tortuous aorta. No acute abnormality of the cardiac and mediastinal silhouettes. BONES AND SOFT TISSUES: Degenerative changes in the spine and shoulders. IMPRESSION: 1. No acute cardiopulmonary findings. 2. Emphysematous changes in the lungs. Electronically signed by: Elsie Gravely MD 04/27/2024 02:01 PM EST RP Workstation: [id number]    Procedures Procedures (including critical care time)  Medications Ordered in UC Medications - No data to display  Initial Impression / Assessment and Plan / UC Course  I have reviewed the triage vital signs and the nursing notes.  Pertinent labs & imaging results that were available during my care of the patient were reviewed by me and considered in my medical decision making (see chart for details).   Final Clinical Impressions(s) / UC Diagnoses   Final diagnoses:  Acute cough  Viral upper respiratory infection     Discharge Instructions      You were seen today for upper respiratory symptoms.  Your flu, covid and RSV tests were negative.  Your chest xray was normal, no evidence of pneumonia or other bacterial infection.  Your symptoms appear to be viral.   I have sent out a medication to help with the cough.  I have sent out several days of a steroid as well to help.  I recommend you use tylenol  for pain/fever.   Please get plenty of rest and fluids.  Return if not improving or worsening.     ED Prescriptions     Medication Sig Dispense Auth. Provider   benzonatate  (TESSALON ) 100 MG capsule Take 1 capsule (100 mg total) by mouth every 8 (eight) hours. 21 capsule Louvinia Cumbo, MD   predniSONE  (DELTASONE ) 20 MG tablet Take 2 tablets (40  mg total) by mouth daily for 5 days. 10 tablet Darral Longs, MD      PDMP not reviewed this encounter.   Darral Longs, MD 04/27/24 1407

## 2024-04-27 NOTE — Discharge Instructions (Addendum)
 You were seen today for upper respiratory symptoms.  Your flu, covid and RSV tests were negative.  Your chest xray was normal, no evidence of pneumonia or other bacterial infection.  Your symptoms appear to be viral.   I have sent out a medication to help with the cough.  I have sent out several days of a steroid as well to help.  I recommend you use tylenol  for pain/fever.   Please get plenty of rest and fluids.  Return if not improving or worsening.

## 2024-04-27 NOTE — ED Triage Notes (Signed)
 Pt present cough with congestion, symptoms started a few days ago. Pt states family members have bronchitis and she believes to have it also.

## 2024-05-04 DIAGNOSIS — R0600 Dyspnea, unspecified: Secondary | ICD-10-CM | POA: Diagnosis not present

## 2024-05-04 DIAGNOSIS — I10 Essential (primary) hypertension: Secondary | ICD-10-CM | POA: Diagnosis not present

## 2024-05-04 DIAGNOSIS — G4733 Obstructive sleep apnea (adult) (pediatric): Secondary | ICD-10-CM | POA: Diagnosis not present

## 2024-05-04 DIAGNOSIS — R0602 Shortness of breath: Secondary | ICD-10-CM | POA: Diagnosis not present

## 2024-05-10 ENCOUNTER — Ambulatory Visit: Admitting: Family Medicine

## 2024-05-14 ENCOUNTER — Ambulatory Visit (INDEPENDENT_AMBULATORY_CARE_PROVIDER_SITE_OTHER): Admitting: Otolaryngology

## 2024-06-03 DIAGNOSIS — G4733 Obstructive sleep apnea (adult) (pediatric): Secondary | ICD-10-CM | POA: Diagnosis not present

## 2024-06-03 DIAGNOSIS — R0600 Dyspnea, unspecified: Secondary | ICD-10-CM | POA: Diagnosis not present

## 2024-06-03 DIAGNOSIS — R0602 Shortness of breath: Secondary | ICD-10-CM | POA: Diagnosis not present

## 2024-06-03 DIAGNOSIS — I10 Essential (primary) hypertension: Secondary | ICD-10-CM | POA: Diagnosis not present

## 2024-07-06 ENCOUNTER — Other Ambulatory Visit: Payer: Self-pay | Admitting: Internal Medicine

## 2024-07-12 ENCOUNTER — Ambulatory Visit: Admitting: Internal Medicine

## 2024-07-12 ENCOUNTER — Encounter: Payer: Self-pay | Admitting: Internal Medicine

## 2024-07-12 VITALS — BP 124/76 | HR 76 | Temp 98.3°F | Ht 62.0 in | Wt 224.0 lb

## 2024-07-12 DIAGNOSIS — R053 Chronic cough: Secondary | ICD-10-CM | POA: Diagnosis not present

## 2024-07-12 DIAGNOSIS — I119 Hypertensive heart disease without heart failure: Secondary | ICD-10-CM | POA: Diagnosis not present

## 2024-07-12 DIAGNOSIS — E785 Hyperlipidemia, unspecified: Secondary | ICD-10-CM | POA: Diagnosis not present

## 2024-07-12 DIAGNOSIS — E1169 Type 2 diabetes mellitus with other specified complication: Secondary | ICD-10-CM

## 2024-07-12 DIAGNOSIS — I700 Atherosclerosis of aorta: Secondary | ICD-10-CM | POA: Diagnosis not present

## 2024-07-12 MED ORDER — BENZONATATE 100 MG PO CAPS: 100.0000 mg | ORAL_CAPSULE | Freq: Three times a day (TID) | ORAL | 2 refills | Status: AC | PRN

## 2024-07-12 MED ORDER — ROSUVASTATIN CALCIUM 20 MG PO TABS: 20.0000 mg | ORAL_TABLET | Freq: Every day | ORAL | 1 refills | Status: AC

## 2024-07-12 MED ORDER — METFORMIN HCL 500 MG PO TABS: 500.0000 mg | ORAL_TABLET | Freq: Two times a day (BID) | ORAL | 2 refills | Status: AC

## 2024-07-12 NOTE — Patient Instructions (Signed)

## 2024-07-12 NOTE — Assessment & Plan Note (Signed)
 Chronic, LDL goal < 70.  She will c/w rosuvastatin 20mg  daily. She is encouraged to follow a heart healthy lifestyle.

## 2024-07-12 NOTE — Assessment & Plan Note (Addendum)
 Chronic, controlled.   She will continue with carvedilol  3.125mg  twice daily and valsartan /hct 80/12.5mg  daily.  - She is encouraged to follow low sodium diet.  - She will f/u in four months.

## 2024-07-12 NOTE — Assessment & Plan Note (Addendum)
 BMI 40.  She is encouraged to strive to lose ten percent of her body weight to decrease cardiac risk.

## 2024-07-12 NOTE — Progress Notes (Signed)
 I,[name], CMA,acting as a neurosurgeon for Angelica LOISE Slocumb, MD.,have documented all relevant documentation on the behalf of Angelica LOISE Slocumb, MD,as directed by  Angelica LOISE Slocumb, MD while in the presence of Angelica LOISE Slocumb, MD.  Subjective:  Patient ID: [name] , female    DOB: 1946/02/02 , 79 y.o.   MRN: [id number]  Chief Complaint  Patient presents with   Diabetes    The patient is here today for a diabetes and blood pressure f/u. She reports compliance with meds. She denies chest pain, shortness of breath and headaches. She denies having any specific questions or concerns.    Hypertension    HPI Discussed the use of AI scribe software for clinical note transcription with the patient, who gave verbal consent to proceed.  History of Present Illness [name] is a 79 year old female with diabetes and hypertension who presents for a diabetes and blood pressure check.  Her blood sugar levels are typically around 112 mg/dL in the morning, with the lowest being 97 mg/dL. She takes metformin  twice a day, although there have been issues with timely refills from the pharmacy.  She has a persistent cough and has been consulting with allergy , pulmonary, and sinus specialists. A chest x-ray in November and a CT of the chest in July 2024 showed a kidney stone, plaque in the heart arteries and aorta, and some scarring at the lung bases. She uses an inhaler and has been prescribed Tessalon  Perles, which she is currently out of. She also uses Flonase  and was prescribed carboxylamine by an allergist, but insurance issues have prevented her from obtaining it.  She is on valsartan  80/12.5 mg for blood pressure and rosuvastatin  20 mg for cholesterol, although she is unsure if she is currently taking the rosuvastatin  due to confusion about refills. Her last LDL cholesterol was 138 mg/dL.  She is trying to work on her diet and increase physical activity to manage her blood  pressure.   Hypertension This is a chronic problem. The current episode started more than 1 year ago. The problem has been gradually improving since onset. The problem is controlled. Pertinent negatives include no blurred vision, chest pain, palpitations or shortness of breath. Risk factors for coronary artery disease include diabetes mellitus, dyslipidemia, obesity, post-menopausal state and sedentary lifestyle. Past treatments include angiotensin blockers and diuretics.  Diabetes She presents for her follow-up diabetic visit. She has type 2 diabetes mellitus. Her disease course has been stable. There are no hypoglycemic associated symptoms. Pertinent negatives for diabetes include no blurred vision and no chest pain. There are no hypoglycemic complications. Risk factors for coronary artery disease include dyslipidemia, diabetes mellitus, hypertension, obesity, post-menopausal and sedentary lifestyle. She is following a generally healthy diet. Her home blood glucose trend is fluctuating minimally. Her breakfast blood glucose is taken between 8-9 am. Her breakfast blood glucose range is generally 110-130 mg/dl. An ACE inhibitor/angiotensin II receptor blocker is being taken. Eye exam is current.     Past Medical History:  Diagnosis Date   Arthritis    hands   Bladder infection    Carpal tunnel syndrome    Complication of anesthesia    Difficult to arouse   Diabetes mellitus    GERD (gastroesophageal reflux disease)    Hypertension    Sleep apnea      Family History  Problem Relation Age of Onset   Diabetes Mother    Hypertension Mother    Asthma Father  Heart attack Father 4   Stroke Father 65   Diabetes Father    Breast cancer Sister 69   Allergic rhinitis Brother    Diabetes Brother    Diabetes Brother    Atopy Paternal Grandmother      Current Outpatient Medications:    albuterol  (VENTOLIN  HFA) 108 (90 Base) MCG/ACT inhaler, Inhale 2 puffs into the lungs every 4 (four)  hours as needed for wheezing or shortness of breath., Disp: 18 g, Rfl: 2   Ascorbic Acid  (VITAMIN C) 1000 MG tablet, Take 1,000 mg by mouth daily., Disp: , Rfl:    ASPIRIN  81 PO, , Disp: , Rfl:    aspirin  EC 81 MG tablet, Take 1 tablet (81 mg total) by mouth 2 (two) times daily., Disp: 60 tablet, Rfl: 0   Azelastine  HCl 0.15 % SOLN, Place 2 sprays into the nose daily., Disp: 30 mL, Rfl: 3   Bacillus Coagulans-Inulin (PROBIOTIC FORMULA PO), Take 1 capsule by mouth 2 (two) times a week., Disp: , Rfl:    Carbinoxamine  Maleate 4 MG TABS, Take 1 tablet (4 mg total) by mouth 2 (two) times daily as needed., Disp: 56 tablet, Rfl: 3   carvedilol  (COREG ) 3.125 MG tablet, Take 1 tablet (3.125 mg total) by mouth 2 (two) times daily., Disp: 180 tablet, Rfl: 1   Cholecalciferol  (VITAMIN D ) 50 MCG (2000 UT) tablet, Take 2,000 Units by mouth daily., Disp: , Rfl:    clotrimazole -betamethasone  (LOTRISONE ) cream, Apply topically 2 (two) times daily as needed., Disp: 30 g, Rfl: 1   Coenzyme Q10 (COQ10) 100 MG CAPS, Take 100 mg by mouth daily with lunch. , Disp: , Rfl:    EPINEPHrine  0.3 mg/0.3 mL IJ SOAJ injection, Inject 0.3 mg into the muscle as needed for anaphylaxis., Disp: 1 each, Rfl: 2   famotidine  (PEPCID ) 20 MG tablet, TAKE 1 TABLET BY MOUTH AFTER SUPPER, Disp: 90 tablet, Rfl: 0   fluticasone  (FLONASE ) 50 MCG/ACT nasal spray, Place 1 spray into both nostrils daily as needed for allergies or rhinitis., Disp: 9.9 mL, Rfl: 3   furosemide  (LASIX ) 20 MG tablet, Take 1 tablet (20 mg total) by mouth daily as needed (fluid)., Disp: 90 tablet, Rfl: 1   ipratropium (ATROVENT ) 0.03 % nasal spray, Place 2 sprays into both nostrils 3 (three) times daily., Disp: 30 mL, Rfl: 12   Lancets (ONETOUCH DELICA PLUS LANCET33G) MISC, USE 1  TO CHECK GLUCOSE TWICE DAILY, BEFORE BREAKFAST AND DINNER, Disp: 100 each, Rfl: 0   montelukast  (SINGULAIR ) 10 MG tablet, Take 1 tablet (10 mg total) by mouth at bedtime., Disp: 90 tablet, Rfl:  1   nystatin  (MYCOSTATIN /NYSTOP ) powder, Apply 1 Application topically 2 (two) times daily. To affected area(s), Disp: 60 g, Rfl: 1   nystatin  cream (MYCOSTATIN ), APPLY  CREAM TOPICALLY TWICE DAILY AS NEEDED, Disp: 30 g, Rfl: 0   ONETOUCH VERIO test strip, USE 1 STRIP TO CHECK GLUCOSE TWICE DAILY, Disp: 100 each, Rfl: 0   pantoprazole  (PROTONIX ) 40 MG tablet, Take 1 tablet (40 mg total) by mouth in the morning., Disp: 90 tablet, Rfl: 1   Potassium Chloride  (KLOR-CON  PO), , Disp: , Rfl:    potassium chloride  SA (KLOR-CON  M) 20 MEQ tablet, One tab po every day prn, with each dose of furosemide ., Disp: 90 tablet, Rfl: 1   traMADol  (ULTRAM ) 50 MG tablet, Take 1 tablet (50 mg total) by mouth at bedtime., Disp: 30 tablet, Rfl: 1   valsartan -hydrochlorothiazide  (DIOVAN -HCT) 80-12.5 MG tablet, Take 1 tablet  by mouth daily., Disp: , Rfl:    benzonatate  (TESSALON ) 100 MG capsule, Take 1 capsule (100 mg total) by mouth 3 (three) times daily as needed for cough., Disp: 30 capsule, Rfl: 2   metFORMIN  (GLUCOPHAGE ) 500 MG tablet, Take 1 tablet (500 mg total) by mouth 2 (two) times daily with a meal., Disp: 180 tablet, Rfl: 2   rosuvastatin  (CRESTOR ) 20 MG tablet, Take 1 tablet (20 mg total) by mouth daily., Disp: 90 tablet, Rfl: 1   Allergies  Allergen Reactions   Pravastatin  Hives   Haemophilus Influenzae Vaccines     Nausea, pain, body weakness   Sulfa Antibiotics Itching   Sulfonamide Derivatives Itching   Tetanus Toxoid, Adsorbed Other (See Comments)   Tetanus Toxoid-Containing Vaccines Swelling    Arm swelled at injection site     Review of Systems  Constitutional: Negative.   Eyes:  Negative for blurred vision.  Respiratory:  Positive for cough. Negative for shortness of breath.   Cardiovascular: Negative.  Negative for chest pain and palpitations.  Gastrointestinal: Negative.   Neurological: Negative.   Psychiatric/Behavioral: Negative.       Today's Vitals   07/12/24 1159  BP: 124/76   Pulse: 76  Temp: 98.3 F (36.8 C)  SpO2: 98%  Weight: 224 lb (101.6 kg)  Height: 5' 2 (1.575 m)   Body mass index is 40.97 kg/m.  Wt Readings from Last 3 Encounters:  07/12/24 224 lb (101.6 kg)  03/10/24 224 lb 8 oz (101.8 kg)  02/05/24 227 lb 3.2 oz (103.1 kg)    c  Objective:  Physical Exam Vitals and nursing note reviewed.  Constitutional:      Appearance: Normal appearance. She is obese.  HENT:     Head: Normocephalic and atraumatic.  Eyes:     Extraocular Movements: Extraocular movements intact.  Cardiovascular:     Rate and Rhythm: Normal rate and regular rhythm.     Heart sounds: Normal heart sounds.  Pulmonary:     Effort: Pulmonary effort is normal.     Breath sounds: Normal breath sounds.  Musculoskeletal:     Cervical back: Normal range of motion.  Skin:    General: Skin is warm.  Neurological:     General: No focal deficit present.     Mental Status: She is alert.  Psychiatric:        Mood and Affect: Mood normal.        Behavior: Behavior normal.         Assessment And Plan:   Assessment & Plan Dyslipidemia associated with type 2 diabetes mellitus (HCC) LDL elevated at 138 mg/dL. Not taking rosuvastatin  due to refill issues. - Refilled rosuvastatin  20 mg, instructed to take Monday through Friday, increase to daily if tolerated. - Renewed metformin  prescription. - Schedule diabetes check in May 2026. Hypertensive heart disease without heart failure Chronic, controlled.   She will continue with carvedilol  3.125mg  twice daily and valsartan /hct 80/12.5mg  daily.  - She is encouraged to follow low sodium diet.  - She will f/u in four months.  Abdominal aortic atherosclerosis Chronic, LDL goal < 70.  She will c/w rosuvastatin  20mg  daily. She is encouraged to follow a heart healthy lifestyle.  Chronic cough Likely multifactorial - PND and GERD are both likely contributing to her symptoms.  Insurance issues affecting medication adherence. - Refilled  Tessalon  Perles. - Continue Protonix  and Singulair . - Use Flonase  as directed. - Use carboxylamine as needed. Morbid obesity (HCC) BMI 40.  She is  encouraged to strive to lose ten percent of her body weight to decrease cardiac risk.     Orders Placed This Encounter  Procedures   CMP14+EGFR   Hemoglobin A1c   Return cancel March appt with RS, for 4 month dm f/u.[name]  Patient was given opportunity to ask questions. Patient verbalized understanding of the plan and was able to repeat key elements of the plan. All questions were answered to their satisfaction.   I, Angelica LOISE Slocumb, MD, have reviewed all documentation for this visit. The documentation on 07/12/24 for the exam, diagnosis, procedures, and orders are all accurate and complete.   IF YOU HAVE BEEN REFERRED TO A SPECIALIST, IT MAY TAKE 1-2 WEEKS TO SCHEDULE/PROCESS THE REFERRAL. IF YOU HAVE NOT HEARD FROM US /SPECIALIST IN TWO WEEKS, PLEASE GIVE US  A CALL AT [phone].

## 2024-07-12 NOTE — Assessment & Plan Note (Signed)
 Likely multifactorial - PND and GERD are both likely contributing to her symptoms.  Insurance issues affecting medication adherence. - Refilled Tessalon  Perles. - Continue Protonix  and Singulair . - Use Flonase  as directed. - Use carboxylamine as needed.

## 2024-07-12 NOTE — Assessment & Plan Note (Signed)
 LDL elevated at 138 mg/dL. Not taking rosuvastatin  due to refill issues. - Refilled rosuvastatin  20 mg, instructed to take Monday through Friday, increase to daily if tolerated. - Renewed metformin  prescription. - Schedule diabetes check in May 2026.

## 2024-07-13 LAB — CMP14+EGFR
ALT: 7 IU/L (ref 0–32)
AST: 16 IU/L (ref 0–40)
Albumin: 3.9 g/dL (ref 3.8–4.8)
Alkaline Phosphatase: 96 IU/L (ref 49–135)
BUN/Creatinine Ratio: 15 (ref 12–28)
BUN: 15 mg/dL (ref 8–27)
Bilirubin Total: 0.4 mg/dL (ref 0.0–1.2)
CO2: 23 mmol/L (ref 20–29)
Calcium: 9.4 mg/dL (ref 8.7–10.3)
Chloride: 103 mmol/L (ref 96–106)
Creatinine, Ser: 0.97 mg/dL (ref 0.57–1.00)
Globulin, Total: 2.6 g/dL (ref 1.5–4.5)
Glucose: 120 mg/dL — ABNORMAL HIGH (ref 70–99)
Potassium: 4.3 mmol/L (ref 3.5–5.2)
Sodium: 143 mmol/L (ref 134–144)
Total Protein: 6.5 g/dL (ref 6.0–8.5)
eGFR: 59 mL/min/1.73 — ABNORMAL LOW

## 2024-07-13 LAB — HEMOGLOBIN A1C
Est. average glucose Bld gHb Est-mCnc: 128 mg/dL
Hgb A1c MFr Bld: 6.1 % — ABNORMAL HIGH (ref 4.8–5.6)

## 2024-07-15 ENCOUNTER — Ambulatory Visit: Payer: Self-pay | Admitting: Internal Medicine

## 2024-07-19 ENCOUNTER — Ambulatory Visit: Payer: Self-pay | Admitting: Internal Medicine

## 2024-07-22 ENCOUNTER — Ambulatory Visit: Admitting: Podiatry

## 2024-07-30 ENCOUNTER — Other Ambulatory Visit: Payer: Self-pay | Admitting: Internal Medicine

## 2024-07-30 DIAGNOSIS — N1831 Chronic kidney disease, stage 3a: Secondary | ICD-10-CM

## 2024-08-03 ENCOUNTER — Other Ambulatory Visit

## 2024-08-17 ENCOUNTER — Ambulatory Visit: Payer: Self-pay | Admitting: Cardiology

## 2024-09-08 ENCOUNTER — Ambulatory Visit: Payer: Medicare HMO

## 2024-09-08 ENCOUNTER — Ambulatory Visit: Payer: Self-pay

## 2024-09-08 ENCOUNTER — Ambulatory Visit: Payer: Self-pay | Admitting: Internal Medicine

## 2024-11-09 ENCOUNTER — Ambulatory Visit: Payer: Self-pay | Admitting: Internal Medicine

## 2025-03-15 ENCOUNTER — Encounter: Payer: Self-pay | Admitting: Internal Medicine
